# Patient Record
Sex: Female | Born: 1945 | Race: Black or African American | Hispanic: No | Smoking: Current every day smoker
Health system: Southern US, Community
[De-identification: ages and names within clinical notes are randomized; demographics above are authoritative.]

## PROBLEM LIST (undated history)

## (undated) DIAGNOSIS — K621 Rectal polyp: Secondary | ICD-10-CM

## (undated) DIAGNOSIS — J449 Chronic obstructive pulmonary disease, unspecified: Secondary | ICD-10-CM

## (undated) DIAGNOSIS — R61 Generalized hyperhidrosis: Secondary | ICD-10-CM

## (undated) DIAGNOSIS — I6529 Occlusion and stenosis of unspecified carotid artery: Secondary | ICD-10-CM

## (undated) DIAGNOSIS — E785 Hyperlipidemia, unspecified: Secondary | ICD-10-CM

## (undated) DIAGNOSIS — I10 Essential (primary) hypertension: Secondary | ICD-10-CM

## (undated) HISTORY — DX: Rectal polyp: K62.1

## (undated) HISTORY — DX: Generalized hyperhidrosis: R61

## (undated) HISTORY — DX: Essential (primary) hypertension: I10

## (undated) HISTORY — DX: Chronic obstructive pulmonary disease, unspecified: J44.9

## (undated) HISTORY — PX: BACK SURGERY: SHX140

## (undated) HISTORY — PX: TUBAL LIGATION: SHX77

## (undated) HISTORY — DX: Occlusion and stenosis of unspecified carotid artery: I65.29

## (undated) HISTORY — DX: Hyperlipidemia, unspecified: E78.5

---

## 1998-01-15 HISTORY — PX: ABDOMINAL HYSTERECTOMY: SHX81

## 2003-09-22 ENCOUNTER — Ambulatory Visit (HOSPITAL_COMMUNITY): Admission: RE | Admit: 2003-09-22 | Discharge: 2003-09-22 | Payer: Self-pay | Admitting: *Deleted

## 2006-06-18 ENCOUNTER — Ambulatory Visit (HOSPITAL_COMMUNITY): Admission: RE | Admit: 2006-06-18 | Discharge: 2006-06-19 | Payer: Self-pay | Admitting: Neurological Surgery

## 2006-09-17 ENCOUNTER — Ambulatory Visit: Payer: Self-pay | Admitting: *Deleted

## 2006-09-17 ENCOUNTER — Inpatient Hospital Stay (HOSPITAL_COMMUNITY): Admission: RE | Admit: 2006-09-17 | Discharge: 2006-09-20 | Payer: Self-pay | Admitting: Neurological Surgery

## 2006-09-19 ENCOUNTER — Encounter (INDEPENDENT_AMBULATORY_CARE_PROVIDER_SITE_OTHER): Payer: Self-pay | Admitting: Neurological Surgery

## 2007-04-29 ENCOUNTER — Encounter (INDEPENDENT_AMBULATORY_CARE_PROVIDER_SITE_OTHER): Payer: Self-pay | Admitting: Nurse Practitioner

## 2007-04-29 ENCOUNTER — Ambulatory Visit: Payer: Self-pay | Admitting: Family Medicine

## 2007-04-29 LAB — CONVERTED CEMR LAB
AST: 14 units/L (ref 0–37)
Alkaline Phosphatase: 140 units/L — ABNORMAL HIGH (ref 39–117)
BUN: 19 mg/dL (ref 6–23)
Basophils Relative: 0 % (ref 0–1)
Creatinine, Ser: 0.92 mg/dL (ref 0.40–1.20)
Eosinophils Absolute: 0 10*3/uL (ref 0.0–0.7)
Hemoglobin: 16.1 g/dL — ABNORMAL HIGH (ref 12.0–15.0)
MCHC: 33.2 g/dL (ref 30.0–36.0)
MCV: 87.4 fL (ref 78.0–100.0)
Monocytes Absolute: 0.5 10*3/uL (ref 0.1–1.0)
Monocytes Relative: 7 % (ref 3–12)
RBC: 5.55 M/uL — ABNORMAL HIGH (ref 3.87–5.11)

## 2007-04-30 ENCOUNTER — Encounter (INDEPENDENT_AMBULATORY_CARE_PROVIDER_SITE_OTHER): Payer: Self-pay | Admitting: Nurse Practitioner

## 2007-04-30 ENCOUNTER — Ambulatory Visit: Payer: Self-pay | Admitting: *Deleted

## 2007-04-30 ENCOUNTER — Ambulatory Visit: Payer: Self-pay | Admitting: Internal Medicine

## 2007-04-30 LAB — CONVERTED CEMR LAB
Cholesterol: 168 mg/dL (ref 0–200)
HDL: 44 mg/dL (ref >39)
Total CHOL/HDL Ratio: 3.8

## 2007-05-05 ENCOUNTER — Ambulatory Visit (HOSPITAL_COMMUNITY): Admission: RE | Admit: 2007-05-05 | Discharge: 2007-05-05 | Payer: Self-pay | Admitting: Family Medicine

## 2007-05-08 ENCOUNTER — Ambulatory Visit (HOSPITAL_COMMUNITY): Admission: RE | Admit: 2007-05-08 | Discharge: 2007-05-08 | Payer: Self-pay | Admitting: Family Medicine

## 2007-10-07 ENCOUNTER — Ambulatory Visit: Payer: Self-pay | Admitting: Family Medicine

## 2007-11-10 ENCOUNTER — Encounter (INDEPENDENT_AMBULATORY_CARE_PROVIDER_SITE_OTHER): Payer: Self-pay | Admitting: Family Medicine

## 2007-11-10 ENCOUNTER — Ambulatory Visit: Payer: Self-pay | Admitting: Internal Medicine

## 2007-11-10 LAB — CONVERTED CEMR LAB
Alkaline Phosphatase: 113 units/L (ref 39–117)
Creatinine, Ser: 0.73 mg/dL (ref 0.40–1.20)
Eosinophils Absolute: 0.1 10*3/uL (ref 0.0–0.7)
Eosinophils Relative: 1 % (ref 0–5)
Glucose, Bld: 205 mg/dL — ABNORMAL HIGH (ref 70–99)
HCT: 46.9 % — ABNORMAL HIGH (ref 36.0–46.0)
HDL: 51 mg/dL (ref >39)
LDL Cholesterol: 92 mg/dL (ref 0–99)
Lymphs Abs: 3.5 10*3/uL (ref 0.7–4.0)
MCV: 89.3 fL (ref 78.0–100.0)
Monocytes Absolute: 0.5 10*3/uL (ref 0.1–1.0)
Platelets: 269 10*3/uL (ref 150–400)
RDW: 14.5 % (ref 11.5–15.5)
Sodium: 139 meq/L (ref 135–145)
Total Bilirubin: 0.4 mg/dL (ref 0.3–1.2)
Total CHOL/HDL Ratio: 3.4
Total Protein: 7 g/dL (ref 6.0–8.3)
Triglycerides: 156 mg/dL — ABNORMAL HIGH (ref <150)
VLDL: 31 mg/dL (ref 0–40)
WBC: 8.1 10*3/uL (ref 4.0–10.5)

## 2008-01-05 ENCOUNTER — Ambulatory Visit: Payer: Self-pay | Admitting: Family Medicine

## 2008-05-21 ENCOUNTER — Ambulatory Visit: Payer: Self-pay | Admitting: Family Medicine

## 2008-05-26 ENCOUNTER — Ambulatory Visit: Payer: Self-pay | Admitting: Vascular Surgery

## 2008-05-26 ENCOUNTER — Encounter (INDEPENDENT_AMBULATORY_CARE_PROVIDER_SITE_OTHER): Payer: Self-pay | Admitting: Family Medicine

## 2008-05-26 ENCOUNTER — Ambulatory Visit (HOSPITAL_COMMUNITY): Admission: RE | Admit: 2008-05-26 | Discharge: 2008-05-26 | Payer: Self-pay | Admitting: Family Medicine

## 2008-05-27 ENCOUNTER — Ambulatory Visit (HOSPITAL_COMMUNITY): Admission: RE | Admit: 2008-05-27 | Discharge: 2008-05-27 | Payer: Self-pay | Admitting: Family Medicine

## 2008-07-08 ENCOUNTER — Ambulatory Visit: Payer: Self-pay | Admitting: Family Medicine

## 2008-09-30 ENCOUNTER — Ambulatory Visit: Payer: Self-pay | Admitting: Family Medicine

## 2008-10-04 ENCOUNTER — Ambulatory Visit (HOSPITAL_COMMUNITY): Admission: RE | Admit: 2008-10-04 | Discharge: 2008-10-04 | Payer: Self-pay | Admitting: Family Medicine

## 2008-12-31 ENCOUNTER — Ambulatory Visit: Payer: Self-pay | Admitting: Family Medicine

## 2008-12-31 LAB — CONVERTED CEMR LAB: Microalb, Ur: 1.46 mg/dL (ref 0.00–1.89)

## 2009-03-17 ENCOUNTER — Ambulatory Visit: Payer: Self-pay | Admitting: Family Medicine

## 2009-07-21 ENCOUNTER — Ambulatory Visit: Payer: Self-pay | Admitting: Family Medicine

## 2009-10-21 ENCOUNTER — Encounter (INDEPENDENT_AMBULATORY_CARE_PROVIDER_SITE_OTHER): Payer: Self-pay | Admitting: Family Medicine

## 2009-10-21 LAB — CONVERTED CEMR LAB
BUN: 9 mg/dL (ref 6–23)
CO2: 25 meq/L (ref 19–32)
Calcium: 9.8 mg/dL (ref 8.4–10.5)
Cholesterol: 166 mg/dL (ref 0–200)
Creatinine, Ser: 0.78 mg/dL (ref 0.40–1.20)
Glucose, Bld: 163 mg/dL — ABNORMAL HIGH (ref 70–99)
Total Bilirubin: 0.3 mg/dL (ref 0.3–1.2)
Total CHOL/HDL Ratio: 2.9
Triglycerides: 84 mg/dL (ref <150)
VLDL: 17 mg/dL (ref 0–40)

## 2010-03-14 ENCOUNTER — Encounter (INDEPENDENT_AMBULATORY_CARE_PROVIDER_SITE_OTHER): Payer: Self-pay | Admitting: Family Medicine

## 2010-03-14 ENCOUNTER — Other Ambulatory Visit (HOSPITAL_COMMUNITY): Payer: Self-pay | Admitting: Family Medicine

## 2010-03-14 DIAGNOSIS — Z1231 Encounter for screening mammogram for malignant neoplasm of breast: Secondary | ICD-10-CM

## 2010-03-23 ENCOUNTER — Ambulatory Visit (HOSPITAL_COMMUNITY)
Admission: RE | Admit: 2010-03-23 | Discharge: 2010-03-23 | Disposition: A | Discharge status: Home / Self Care | Payer: Medicare Other | Source: Ambulatory Visit | Attending: Family Medicine | Admitting: Family Medicine

## 2010-03-23 DIAGNOSIS — Z1231 Encounter for screening mammogram for malignant neoplasm of breast: Secondary | ICD-10-CM

## 2010-05-10 ENCOUNTER — Other Ambulatory Visit: Payer: Self-pay | Admitting: Surgery

## 2010-05-10 ENCOUNTER — Ambulatory Visit (HOSPITAL_COMMUNITY)
Admission: RE | Admit: 2010-05-10 | Discharge: 2010-05-10 | Disposition: A | Discharge status: Home / Self Care | Payer: Medicare Other | Source: Ambulatory Visit | Attending: Surgery | Admitting: Surgery

## 2010-05-10 ENCOUNTER — Other Ambulatory Visit (HOSPITAL_COMMUNITY): Payer: Self-pay | Admitting: Surgery

## 2010-05-10 ENCOUNTER — Encounter (HOSPITAL_COMMUNITY): Payer: Medicare Other

## 2010-05-10 DIAGNOSIS — Z01818 Encounter for other preprocedural examination: Secondary | ICD-10-CM | POA: Insufficient documentation

## 2010-05-10 DIAGNOSIS — Z01811 Encounter for preprocedural respiratory examination: Secondary | ICD-10-CM

## 2010-05-10 DIAGNOSIS — F17200 Nicotine dependence, unspecified, uncomplicated: Secondary | ICD-10-CM | POA: Insufficient documentation

## 2010-05-10 DIAGNOSIS — D126 Benign neoplasm of colon, unspecified: Secondary | ICD-10-CM | POA: Insufficient documentation

## 2010-05-10 DIAGNOSIS — Z01810 Encounter for preprocedural cardiovascular examination: Secondary | ICD-10-CM | POA: Insufficient documentation

## 2010-05-10 DIAGNOSIS — E119 Type 2 diabetes mellitus without complications: Secondary | ICD-10-CM | POA: Insufficient documentation

## 2010-05-10 DIAGNOSIS — Z01812 Encounter for preprocedural laboratory examination: Secondary | ICD-10-CM | POA: Insufficient documentation

## 2010-05-10 DIAGNOSIS — R0602 Shortness of breath: Secondary | ICD-10-CM | POA: Insufficient documentation

## 2010-05-10 LAB — BASIC METABOLIC PANEL
BUN: 10 mg/dL (ref 6–23)
CO2: 27 mEq/L (ref 19–32)
Calcium: 9.9 mg/dL (ref 8.4–10.5)
GFR calc non Af Amer: 58 mL/min — ABNORMAL LOW (ref >60)
Glucose, Bld: 263 mg/dL — ABNORMAL HIGH (ref 70–99)
Potassium: 4.6 mEq/L (ref 3.5–5.1)

## 2010-05-10 LAB — CBC
HCT: 48.8 % — ABNORMAL HIGH (ref 36.0–46.0)
Hemoglobin: 16 g/dL — ABNORMAL HIGH (ref 12.0–15.0)
MCH: 28.4 pg (ref 26.0–34.0)
MCHC: 32.8 g/dL (ref 30.0–36.0)
MCV: 86.7 fL (ref 78.0–100.0)
Platelets: 267 10*3/uL (ref 150–400)
RBC: 5.63 MIL/uL — ABNORMAL HIGH (ref 3.87–5.11)
RDW: 14.6 % (ref 11.5–15.5)
WBC: 9 10*3/uL (ref 4.0–10.5)

## 2010-05-16 ENCOUNTER — Other Ambulatory Visit: Payer: Self-pay | Admitting: Surgery

## 2010-05-16 ENCOUNTER — Ambulatory Visit (HOSPITAL_COMMUNITY)
Admission: RE | Admit: 2010-05-16 | Discharge: 2010-05-17 | Disposition: A | Discharge status: Home / Self Care | Payer: Medicare Other | Source: Ambulatory Visit | Attending: Surgery | Admitting: Surgery

## 2010-05-16 DIAGNOSIS — F17200 Nicotine dependence, unspecified, uncomplicated: Secondary | ICD-10-CM | POA: Insufficient documentation

## 2010-05-16 DIAGNOSIS — R05 Cough: Secondary | ICD-10-CM | POA: Insufficient documentation

## 2010-05-16 DIAGNOSIS — Z7982 Long term (current) use of aspirin: Secondary | ICD-10-CM | POA: Insufficient documentation

## 2010-05-16 DIAGNOSIS — E119 Type 2 diabetes mellitus without complications: Secondary | ICD-10-CM | POA: Insufficient documentation

## 2010-05-16 DIAGNOSIS — E7800 Pure hypercholesterolemia, unspecified: Secondary | ICD-10-CM | POA: Insufficient documentation

## 2010-05-16 DIAGNOSIS — Z79899 Other long term (current) drug therapy: Secondary | ICD-10-CM | POA: Insufficient documentation

## 2010-05-16 DIAGNOSIS — I10 Essential (primary) hypertension: Secondary | ICD-10-CM | POA: Insufficient documentation

## 2010-05-16 DIAGNOSIS — D128 Benign neoplasm of rectum: Secondary | ICD-10-CM | POA: Insufficient documentation

## 2010-05-16 DIAGNOSIS — R059 Cough, unspecified: Secondary | ICD-10-CM | POA: Insufficient documentation

## 2010-05-16 HISTORY — PX: RECTAL POLYPECTOMY: SHX2309

## 2010-05-16 LAB — GLUCOSE, CAPILLARY
Glucose-Capillary: 196 mg/dL — ABNORMAL HIGH (ref 70–99)
Glucose-Capillary: 220 mg/dL — ABNORMAL HIGH (ref 70–99)
Glucose-Capillary: 219 mg/dL — ABNORMAL HIGH (ref 70–99)

## 2010-05-17 LAB — GLUCOSE, CAPILLARY: Glucose-Capillary: 197 mg/dL — ABNORMAL HIGH (ref 70–99)

## 2010-05-30 NOTE — Op Note (Signed)
[name], [name]                  ACCOUNT NO.:  [id number]   MEDICAL RECORD NO.:  [id number]          PATIENT TYPE:  OIB   LOCATION:  3172                         FACILITY:  [facility]   PHYSICIAN:  Stefani Dama, M.D.  DATE OF BIRTH:  [birth year]   DATE OF PROCEDURE:  06/18/2006  DATE OF DISCHARGE:                               OPERATIVE REPORT   PREOPERATIVE DIAGNOSIS:  Herniated nucleus pulposus, L4-L5 left, with  left lumbar radiculopathy.   POSTOPERATIVE DIAGNOSIS:  Herniated nucleus pulposus, L4-L5 left, with  left lumbar radiculopathy.   PROCEDURE:  Left L4-L5 microdiskectomy, with operating microscope and  microdissection technique.   SURGEON:  Stefani Dama, M.D.   FIRST ASSISTANT:  Clydene Fake, M.D.   ANESTHESIA:  General endotracheal.   INDICATIONS:  [name] is a 65 year old individual who as had  significant back and left lower extremity pain for several months now.  She has failed efforts at conservative management and was noted to have  a small disk herniation at L4-L5 on the left-hand side.  After repeated  attempts at conservative management.  She was ultimately advised  regarding surgical decompression of the disk space at L4-5 on the left  side.  She is now taken to the operating room.   PROCEDURE:  The patient was brought to the operating room supine on the  stretcher.  After smooth induction of general endotracheal anesthesia,  she was turned prone.  The back was shaved, prepped with alcohol and  DuraPrep and draped in a sterile fashion.  A midline incision was  created and carried down to lumbodorsal fascia.  The fascia was opened  on left side of the midline.  The first interspace was marked and noted  be L4-L5 on localizing radiograph.  Laminotomy was then created,  removing the inferior margin of lamina of L4 out to the medial wall of  the facet.  The laminotomy was enlarged by taking up the yellow ligament  and identifying the common dural  tube.  The region of the L5 nerve root  takeoff was identified with a slight medial facetectomy being performed  and immediately, the L5 nerve root came into view.  It was noted to be  bowed dorsally, coming out at a rather oblique angle to the sac.  By  dissecting in the epidural space there was noted be a significant mass  just cephalad and medial to the nerve root that elevating it and  compressing it.  The mass was noted be a fragment of disk material and  this was removed in several small fragments from this area, covered by a  thin veil of scar tissue.  The disk herniation itself was immediately  adjacent to the disk space and further dissection yielded an opening  into the disk space.  This was enlarged with a number 15 blade and then,  a combination of Kerrison rongeurs removed a substantial amount of  severely degenerated disk material from the L4-L5 space.  The procedure  continued using curettes, rongeurs and nerve hooks to reach all the  possible degenerated disk  material that was able to be with this  aperture.  In the end, the common dural tube and the nerve root was well  decompressed.  The wound was irrigated copiously, including the disk  space to remove and float out any other loose fragments.  Once this was  completed and the area with secured hemostasis, the epidural space was  again checked with bipolar cautery and then, 40 mg Depo-Medrol, along  with 50 mcg fentanyl was left in the epidural space.  The microscope was  removed from the field.  The wound was then  closed in layers with number 1 Vicryl in the lumbodorsal fascia, 2-0  Vicryl subcutaneous tissues and 3-0 Vicryl subcuticular.  A dry sterile  dressing was placed on the skin.  It should be noted that during this  procedure 30 mL of local lidocaine and Marcaine mixture was injected  into the paraspinous musculature and the subcutaneous tissues.      Stefani Dama, M.D.  Electronically Signed      HJE/MEDQ  D:  06/18/2006  T:  06/19/2006  Job:  [id number]

## 2010-05-30 NOTE — Op Note (Signed)
[name], [name]                  ACCOUNT NO.:  [id number]   MEDICAL RECORD NO.:  [id number]          PATIENT TYPE:  INP   LOCATION:  2864                         FACILITY:  [facility]   PHYSICIAN:  Stefani Dama, M.D.  DATE OF BIRTH:  1945-11-06   DATE OF PROCEDURE:  09/17/2006  DATE OF DISCHARGE:                               OPERATIVE REPORT   PREOPERATIVE DIAGNOSIS:  Recurrent herniated nucleus pulposus, L4-5,  with lumbar radiculopathy, spondylosis L4-5.   POSTOPERATIVE DIAGNOSIS:  Recurrent herniated nucleus pulposus, L4-5,  with lumbar radiculopathy, spondylosis L4-5.   PROCEDURES:  Anterior lumbar interbody arthrodesis, L4-5, with  decompression of L4 and L5 nerve roots, PEEK spacer, anterior fixation  device.   SURGEON:  Stefani Dama, M.D.   FIRST ASSISTANT:  Cristi Loron, M.D.   ANESTHESIA:  General endotracheal.   INDICATIONS:  [name] is a 65 year old individual who has had a  significant problem with a herniated nucleus pulposus.  Four or 5 months  ago she underwent posterior laminotomy and diskectomy.  She recovered  very well and did well for the first 3 months' time.  Soon after she  return to work she developed a sudden and severe recurrence of pain and  noted that she had severe problems with back pain associated with the  leg pain.  She was found to have a recurrence of the disk herniation and  also had developed a retrolisthesis of L4 on L5 with marked edema in the  endplates of the vertebrae.  She was advised regarding need for not only  decompression but a stabilization procedure at the L4-5 level.   PROCEDURE:  The patient was brought to the operating room, placed on  table in supine position.  After the smooth induction of general  endotracheal anesthesia, she underwent an anterior retroperitoneal  approach by Dr. Liliane Bade.  Once the exposure was obtained with  retractors in place to expose the anterior portion of the L4 and L5  vertebrae,  I started the procedure by opening the anterior longitudinal  ligament and encountering a severely degenerated disk at the L4-5 level.  This came out in a piecemeal fashion with a significant number of  fragments of disk in the center portion of the disk space.  These were  removed and the endplates were curetted to remove near all the cartilage  that was available.  As the region of the posterior longitudinal  ligament was explored, a self-retaining disk spreader could be placed  into this area to allow some distraction and an opening was found into  the epidural space, particularly on the left side where the previous  diskectomy was performed.  In this area there was noted to be some free  disk material that was up against the dura.  This was removed and  resected and allowed for good decompression of the common dural tube,  and on the lateral aspect the exiting L4 nerve root could be identified.  This area was also decompressed as there was noted to be a substantial  portion of the disk that appeared  herniated into the lateral recess.  With this, then the dissection was carried across to the right side and  the ligament was allowed to remain intact but the disk was removed in  its entirety.  Once this was accomplished, the endplates were smoothed  with a high-speed barrel bur to remove any remnants of cartilaginous  endplate and to assure that there was good bony continuity with whatever  graft had been placed.  Then a series of sizers were used to choose the  appropriate-size 30 x 36 mm PEEK interbody spacer along with an anterior  plate was chosen.  This was sized with radiographic confirmation  obtained in the AP and lateral projections and ultimately when the PEEK  spacer was obtained, it was filled with a combination of beta tricalcium  phosphate along with some small pledgets of Infuse.  The Infuse and the  graft were then placed in position using the squid applicator and then   four 20-mm screws were placed into the vertebrae, two into L4 and two  into L5.  Final radiographic confirmation of the implant was obtained.  Hemostasis was checked in the soft tissues and then the retractors were  removed and the procedure was turned back over to Dr. Madilyn Fireman for closure.      Stefani Dama, M.D.  Electronically Signed     HJE/MEDQ  D:  09/17/2006  T:  09/17/2006  Job:  [id number]

## 2010-05-30 NOTE — Op Note (Signed)
[name], [name]                  ACCOUNT NO.:  [id number]   MEDICAL RECORD NO.:  [id number]          PATIENT TYPE:  INP   LOCATION:  2864                         FACILITY:  [facility]   PHYSICIAN:  Balinda Quails, M.D.    DATE OF BIRTH:  January 28, 1945   DATE OF PROCEDURE:  09/17/2006  DATE OF DISCHARGE:                               OPERATIVE REPORT   SURGEON:  Denman George, MD   CO-SURGEON:  Stefani Dama, MD.   ANESTHETIC:  General endotracheal.   ANESTHESIOLOGIST:  Bedelia Person, M.D.   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   POSTOPERATIVE DIAGNOSIS:   PROCEDURE:  L4-5 anterior lumbar interbody fusion.   CLINICAL NOTE:  [name] is a 65 year old female with a history of  previous diskectomy at L4-5.  She has recurrent pain and the plan at  this time is to perform L4-5 anterior lumbar interbody fusion.   The patient was seen preoperatively and evaluated.  Felt to be good  candidate.  No history of MI.  Denied vascular disease.  No DVT.  Normal  lower extremity pulses.  It was discussed with the patient the potential  operative risks with a major morbidity and mortality of 1-2%.  The  patient consented for surgery.   PROCEDURE NOTE:  The patient was brought to the operating room in a  stable hemodynamic condition.  Placed under general endotracheal  anesthesia in the supine position.  The abdomen prepped and draped in a  sterile fashion.  A transverse skin incision made in the left lower  quadrant at the [facility] site for L4-5.  Subcutaneous tissue divided  with electrocautery.  Anterior rectus sheath divided from midline to  lateral margin.  Superior and inferior rectus flaps created.  The rectus  muscle bluntly mobilized.  Retracted medially.   The retroperitoneal space was entered and the peritoneum freed from the  posterior rectus sheath, which was divided longitudinally.  The  retroperitoneal space was then further created and the ureter rotated  anteriorly with the abdominal contents.  The left common iliac and  external iliac artery were skeletonized and retracted medially.  The  left common iliac vein and external iliac vein also mobilized, dissected  bluntly.  The iliolumbar vein divided after ligation with 2-0 silk.  The  L4-5 disk was palpated.  The L4 segmental vessels were clipped and  divided.  The left common iliac artery and vein were mobilized to the  right.  This allowed complete exposure of the L4-5 disk.   Using the Brau-Thompson retractor with reverse lips, the L4-5 this was  exposed.  Malleable retractors used to retract superiorly and  inferiorly.   The disk space was verified with fluoroscopy.  Dr. Danielle Dess then completed  L4-5 ALIF.   At completion of the ALIF, retractors were removed.  The retroperitoneal  space examined.  There was no significant bleeding.  Sponge and  instrument counts correct.   Pulse oximetry on the left foot remained 99% throughout the procedure.   The anterior rectus sheath then closed running 0 Vicryl suture.  Subcutaneous tissue closed with running 2-0 Vicryl suture.  Skin closed  4-0 Monocryl.  Dermabond applied.  No apparent complications.  A 2+ left  dorsalis pedis pulse at termination of the procedure.  The patient  transferred to the recovery room in stable condition.      Balinda Quails, M.D.  Electronically Signed     PGH/MEDQ  D:  09/17/2006  T:  09/17/2006  Job:  [id number]

## 2010-05-30 NOTE — Op Note (Signed)
[name], [name]                  ACCOUNT NO.:  [id number]  MEDICAL RECORD NO.:  [id number]           PATIENT TYPE:  I  LOCATION:  1536                         FACILITY:  [facility]  PHYSICIAN:  Ardeth Sportsman, MD     DATE OF BIRTH:  07-08-1945  DATE OF PROCEDURE:  05/16/2010 DATE OF DISCHARGE:                              OPERATIVE REPORT   PRIMARY CARE PHYSICIAN:  Maurice March, M.D.  GASTROENTEROLOGIST:  Jordan Hawks. Elnoria Howard, M.D.  OPERATING SURGEON:  Ardeth Sportsman, M.D.  ASSISTANT:  RN  PREOPERATIVE DIAGNOSIS:  Tubovillous adenoma of the distal rectum.  POSTOPERATIVE DIAGNOSIS:  Tubovillous adenoma of the distal rectum.  PROCEDURE PERFORMED:  Transanal endoscopic microsurgical partial proctectomy of rectal polyp (TEM).  ANESTHESIA: 1. General. 2. Anorectal field block.  SPECIMENS:  Rectal polyp. 1. The pink pins are proximal. 2. The yellow pins are distal. 3. The lavender are left lateral. 4. The blue teal are right lateral. 5. New right lateral margin pins are similar.  ESTIMATED BLOOD LOSS:  Minimal.  COMPLICATIONS:  None apparent.  INDICATIONS:  [name] is a 65 year old female with a family history of colon cancer.  She was found on a colonoscopy to have a polyp in her distal rectum.  She was sent to me for surgical excision, as the biopsy came back consistent with tubovillous adenoma.  Anatomy and physiology of the digestive tract was explained. Pathophysiology of colon polyps and progression to cancer were discussed.  Options were discussed, recommendations made for resection of the polyp.  Because there is no definite diagnosis of cancer, I think incisional biopsy with good margins is appropriate through transanal and TEM techniques.  Although it was rather distal, I was worried about the proximal margin being able to see and get a good proximal view transanally, so I recommended a TEM approach.  She agreed to proceed.  OPERATIVE FINDINGS:  She had  a distal rectal polyp in the posterior midline going from about 4 o'clock to 7 o'clock, slightly left to the midline posteriorly rather distal.  It seemed initially pedunculated, but it was actually rather sessile with just a redundant, floppy rectum. It was about 2.5 cm base.  It seemed soft and not strongly suspicious for cancer.  DESCRIPTION OF PROCEDURE:  Informed consent was confirmed.  The patient underwent general anesthesia without any difficulty.  She was placed in lateral lithotomy.  She received IV antibiotics.  She had a Foley catheter placed.  Her perineum and abdomen were prepped and draped in the sterile fashion.  Surgical time-out confirmed the plan.  I did digital examination to confirm proper orientation.  After doing some gentle finger dilation and larger anoscopes, I was able to place a 4 cm TEO Storz anoscope into the rectum.  Mounted up the system and I induced a carbon dioxide of intraluminal insufflation.  Camera inspection were noted proper location.  I used a needle tip cautery and marked 1.5 cm margin circumferentially starting distally and moving around circumferentially.  I started going through the proximal anoderm towards the distal margin and transitioned over to ultrasonic dissection  to get a good circumferential margin, starting first on a left lateral, then distal, then right lateral, then posteriorly, and finally the proximal margin.  It came out intact and I marked with pins.  In pinning it, the right lateral margin seemed a little closer than I anticipated, so I took another 1 cm band of the right lateral margin to make sure I had good margins and we pinned it.  I scrubbed down, went to Pathology, and showed it to them for proper orientation, which they confirmed.  I went back in.  Hemostasis was good.  Doing inspection, sphincter was intact, everything else was normal.  I went ahead and reapproximated using 2-0 Vicryl interrupted stitches  primarily transanally through the [employer] system.  I used interrupted stitches and had good closure.  I released and felt the sphincter was intact.  Hemostasis was good.  I placed a Gelfoam bullet up in to the rectum.  The patient was extubated and taken to the recovery room in stable condition.  We will observe her overnight for pain control and monitoring.     Ardeth Sportsman, MD     SCG/MEDQ  D:  05/16/2010  T:  05/17/2010  Job:  [id number]  cc:   Maurice March, M.D. Fax: [phone]  Jordan Hawks. Elnoria Howard, MD Fax: [phone]  Electronically Signed by Karie Soda MD on 05/30/2010 12:06:16 PM

## 2010-05-30 NOTE — Discharge Summary (Signed)
NAMETANEAH, MASRI                  ACCOUNT NO.:  [id number]   MEDICAL RECORD NO.:  [id number]          PATIENT TYPE:  INP   LOCATION:  3028                         FACILITY:  [facility]   PHYSICIAN:  Stefani Dama, M.D.  DATE OF BIRTH:  [birth year]   DATE OF ADMISSION:  09/17/2006  DATE OF DISCHARGE:  09/20/2006                               DISCHARGE SUMMARY   ADMITTING DIAGNOSIS:  Lumbar spondylosis with recurrent herniated  nucleus pulposus, L4-L5 left lumbar radiculopathy.   FINAL DIAGNOSIS:  1. Lumbar spondylosis, recurrent herniated nucleus pulposus L4-L5 with      left lumbar radiculopathy.  2. Diagnosis is diabetes mellitus.   CONDITION ON DISCHARGE:  Improving.   HOSPITAL COURSE:  Rosilyn Coachman is a 65 year old individual who earlier  this year underwent diskectomy for herniated nucleus pulposus at L4-L5  on the left side.  She tolerated the surgery well, but when she got back  to the work place, she suddenly re-herniated the disk and developed  severe excruciating back and leg pain.  After careful consideration, it  was noted that she had severe spondylitic changes in L4-L5 space, and  she was advised regarding decompression and arthrodesis.  This was  undertaken via an anterior lumbar interbody fusion on September 17, 2006.  Postoperatively, she was ambulating.  She had a bout of fever on the  second postoperative day.  She was treated with incentive spirometer and  vigorous ambulation, and it seemed to resolve itself.  The incision is  clean and dry, and at current time she is discharged home with Lovenox  40 mg subcu daily for 10 days, followed by Percocet and Valium for pain  control.  She will be seen in the office in three weeks' time.   CONDITION ON DISCHARGE:  Improved.      Stefani Dama, M.D.  Electronically Signed     HJE/MEDQ  D:  09/20/2006  T:  09/20/2006  Job:  [id number]

## 2010-08-01 ENCOUNTER — Emergency Department (HOSPITAL_COMMUNITY): Payer: Medicare Other

## 2010-08-01 ENCOUNTER — Observation Stay (HOSPITAL_COMMUNITY)
Admission: EM | Admit: 2010-08-01 | Discharge: 2010-08-02 | Disposition: A | Discharge status: Home / Self Care | Payer: Medicare Other | Attending: Family Medicine | Admitting: Family Medicine

## 2010-08-01 DIAGNOSIS — R0602 Shortness of breath: Secondary | ICD-10-CM | POA: Insufficient documentation

## 2010-08-01 DIAGNOSIS — K859 Acute pancreatitis without necrosis or infection, unspecified: Secondary | ICD-10-CM | POA: Insufficient documentation

## 2010-08-01 DIAGNOSIS — Z8249 Family history of ischemic heart disease and other diseases of the circulatory system: Secondary | ICD-10-CM | POA: Insufficient documentation

## 2010-08-01 DIAGNOSIS — Z7982 Long term (current) use of aspirin: Secondary | ICD-10-CM | POA: Insufficient documentation

## 2010-08-01 DIAGNOSIS — M47817 Spondylosis without myelopathy or radiculopathy, lumbosacral region: Secondary | ICD-10-CM | POA: Insufficient documentation

## 2010-08-01 DIAGNOSIS — E119 Type 2 diabetes mellitus without complications: Secondary | ICD-10-CM | POA: Insufficient documentation

## 2010-08-01 DIAGNOSIS — Z79899 Other long term (current) drug therapy: Secondary | ICD-10-CM | POA: Insufficient documentation

## 2010-08-01 DIAGNOSIS — F17200 Nicotine dependence, unspecified, uncomplicated: Secondary | ICD-10-CM | POA: Insufficient documentation

## 2010-08-01 DIAGNOSIS — J209 Acute bronchitis, unspecified: Secondary | ICD-10-CM | POA: Insufficient documentation

## 2010-08-01 DIAGNOSIS — E7800 Pure hypercholesterolemia, unspecified: Secondary | ICD-10-CM | POA: Insufficient documentation

## 2010-08-01 DIAGNOSIS — I10 Essential (primary) hypertension: Secondary | ICD-10-CM | POA: Insufficient documentation

## 2010-08-01 DIAGNOSIS — R0789 Other chest pain: Principal | ICD-10-CM | POA: Insufficient documentation

## 2010-08-01 LAB — COMPREHENSIVE METABOLIC PANEL
ALT: 15 U/L (ref 0–35)
AST: 17 U/L (ref 0–37)
Alkaline Phosphatase: 98 U/L (ref 39–117)
Calcium: 9.5 mg/dL (ref 8.4–10.5)
GFR calc Af Amer: >60 mL/min (ref >60)
Glucose, Bld: 211 mg/dL — ABNORMAL HIGH (ref 70–99)
Potassium: 4.5 mEq/L (ref 3.5–5.1)
Sodium: 138 mEq/L (ref 135–145)
Total Protein: 7.3 g/dL (ref 6.0–8.3)

## 2010-08-01 LAB — CARDIAC PANEL(CRET KIN+CKTOT+MB+TROPI)
CK, MB: 1.4 ng/mL (ref 0.3–4.0)
Relative Index: INVALID (ref 0.0–2.5)
Total CK: 39 U/L (ref 7–177)
Troponin I: <0.3 ng/mL (ref <0.30)

## 2010-08-01 LAB — DIFFERENTIAL
Basophils Absolute: 0 10*3/uL (ref 0.0–0.1)
Lymphocytes Relative: 36 % (ref 12–46)
Monocytes Absolute: 0.4 10*3/uL (ref 0.1–1.0)
Monocytes Relative: 6 % (ref 3–12)
Neutro Abs: 3.8 10*3/uL (ref 1.7–7.7)
Neutrophils Relative %: 58 % (ref 43–77)

## 2010-08-01 LAB — GLUCOSE, CAPILLARY

## 2010-08-01 LAB — CBC
HCT: 44.7 % (ref 36.0–46.0)
Hemoglobin: 15.8 g/dL — ABNORMAL HIGH (ref 12.0–15.0)
MCH: 30.3 pg (ref 26.0–34.0)
MCHC: 35.3 g/dL (ref 30.0–36.0)
RBC: 5.21 MIL/uL — ABNORMAL HIGH (ref 3.87–5.11)

## 2010-08-01 LAB — CK TOTAL AND CKMB (NOT AT ARMC)
CK, MB: 1.3 ng/mL (ref 0.3–4.0)
Total CK: 49 U/L (ref 7–177)

## 2010-08-01 LAB — TROPONIN I: Troponin I: <0.3 ng/mL (ref <0.30)

## 2010-08-02 ENCOUNTER — Observation Stay (HOSPITAL_COMMUNITY): Payer: Medicare Other

## 2010-08-02 LAB — DIFFERENTIAL
Basophils Absolute: 0 10*3/uL (ref 0.0–0.1)
Basophils Relative: 0 % (ref 0–1)
Eosinophils Absolute: 0 10*3/uL (ref 0.0–0.7)
Eosinophils Relative: 0 % (ref 0–5)
Neutrophils Relative %: 84 % — ABNORMAL HIGH (ref 43–77)

## 2010-08-02 LAB — CBC
MCV: 86.3 fL (ref 78.0–100.0)
Platelets: 261 10*3/uL (ref 150–400)
RBC: 5.11 MIL/uL (ref 3.87–5.11)
RDW: 14.4 % (ref 11.5–15.5)
WBC: 9.2 10*3/uL (ref 4.0–10.5)

## 2010-08-02 LAB — LIPID PANEL
Cholesterol: 146 mg/dL (ref 0–200)
HDL: 56 mg/dL (ref >39)
Triglycerides: 48 mg/dL (ref <150)
VLDL: 10 mg/dL (ref 0–40)

## 2010-08-02 LAB — COMPREHENSIVE METABOLIC PANEL
Albumin: 3.6 g/dL (ref 3.5–5.2)
BUN: 13 mg/dL (ref 6–23)
Calcium: 9.4 mg/dL (ref 8.4–10.5)
Chloride: 104 mEq/L (ref 96–112)
Creatinine, Ser: 0.65 mg/dL (ref 0.50–1.10)
GFR calc non Af Amer: >60 mL/min (ref >60)
Total Bilirubin: 0.1 mg/dL — ABNORMAL LOW (ref 0.3–1.2)

## 2010-08-02 LAB — HEMOGLOBIN A1C: Mean Plasma Glucose: 203 mg/dL — ABNORMAL HIGH (ref <117)

## 2010-08-02 LAB — CARDIAC PANEL(CRET KIN+CKTOT+MB+TROPI)
CK, MB: 1.6 ng/mL (ref 0.3–4.0)
Relative Index: INVALID (ref 0.0–2.5)
Total CK: 60 U/L (ref 7–177)
Troponin I: <0.3 ng/mL (ref <0.30)

## 2010-08-02 LAB — GLUCOSE, CAPILLARY: Glucose-Capillary: 234 mg/dL — ABNORMAL HIGH (ref 70–99)

## 2010-08-02 MED ORDER — TECHNETIUM TC 99M TETROFOSMIN IV KIT
10.0000 | PACK | Freq: Once | INTRAVENOUS | Status: AC | PRN
  Administered 2010-08-02: 10 via INTRAVENOUS

## 2010-08-02 MED ORDER — TECHNETIUM TC 99M TETROFOSMIN IV KIT
30.0000 | PACK | Freq: Once | INTRAVENOUS | Status: AC | PRN
  Administered 2010-08-02: 30 via INTRAVENOUS

## 2010-08-02 NOTE — Discharge Summary (Signed)
NAMEKRYSTYNA, [name] NO.:  [id number]  MEDICAL RECORD NO.:  [id number]  LOCATION:  3733                         FACILITY:  [facility]  PHYSICIAN:  Standley Dakins, MD   DATE OF BIRTH:  [birth year]  DATE OF ADMISSION:  08/01/2010 DATE OF DISCHARGE:  08/02/2010                              DISCHARGE SUMMARY   PRIMARY CARE PHYSICIAN:  Colleen March, MD with the [facility].  CARDIOLOGISTEduardo Osier Sharyn Lull, MD with [facility].  DISCHARGE DIAGNOSES: 1. Atypical chest pain. 2. Diabetes mellitus type 2, insulin requiring, poorly controlled. 3. Lumbar spondylosis. 4. Hyperlipidemia - controlled. 5. Villous adenoma status post removal in 2011. 6. Status post back surgery. 7. Chronic active nicotine dependence. 8. Acute bronchitis. 9. Mild acute pancreatitis with elevated lipase level.  DISCHARGE MEDICATIONS: 1. Azithromycin 250 mg 1 tablet p.o. daily for an additional 4 days. 2. Nitroglycerin 0.4 mg tablets sublingual under the tongue every 5     minutes up to three doses as needed for chest pain. 3. Aspirin 81 mg 1 tablet by mouth every morning. 4. Glimepiride 2 mg 1 tablet by mouth twice daily. 5. Lisinopril 5 mg 1 tablet by mouth every morning. 6. Metformin 2 tablets by mouth twice daily. 7. Pravastatin 40 mg 1 tablet p.o. daily at bedtime. 8. Vitamin D3 1000 units 2 tablets by mouth every morning.  HOSPITAL COURSE:  Briefly, this patient is a 65 year old female smoker with poorly-controlled type 2 diabetes mellitus, who presented to the emergency department with atypical substernal chest pain that was 8/10 in severity associated with shortness of breath.  She was found to have an acute exacerbation of bronchitis and mild pancreatitis with a mildly elevated lipase.  The patient was admitted into the hospital and seen by the cardiology specialist, Dr. Sharyn Douglas who recommended a stress test. She  had a stress test done on August 02, 2010, and the preliminary results of that test revealed no significant reversible cardiac ischemia.  The final results of this stress test are still pending at time of discharge.  The patient is going to be following up with her PCP and cardiologist outpatient to review the final results.  She has had resolution of all of these symptoms at this time.  She was treated with antibiotics for the bronchitis and her symptoms resolved completely.  In addition, hemoglobin A1c revealed that her glycemic control has been poor with an A1c of 8.7.  Her LDL cholesterol is 80, HDL cholesterol is 56, total cholesterol 146, and triglycerides 48.  Given that the patient's symptoms have completely resolved and she has tolerated the stress test well and symptoms resolved, preliminary reports are that no arrhythmias seen and negative Persantine test and Myoview test pending. The patient will be discharged to follow up the results outpatient with her PCP.  DISCHARGE CONDITION:  Stable.  DISPOSITION:  The patient will be discharged home with family members.  ACTIVITY:  Ad lib.  DIET:  Cardiac diet recommended.  Diabetic diet recommended.  No concentrated sweets or fruit juices except to treat a low blood glucose.  FOLLOWUP:  The patient should follow  up with her primary care physician, Dr. Audria Douglas in 1 week and cardiologist in 2 weeks.  SPECIAL INSTRUCTIONS: 1. Please tests blood glucose 1-2 times per day and p.r.n. 2. Avoid concentrated sweets. 3. Please stop using nicotine products. 4. Call 1800 - quit now for assistance with smoking cessation. 5. Return if symptoms recur, worsen or new changes develop like     recurrent chest pain, shortness of breath, visual changes, or     changes in mental status.  ADDENDUM:  I did get the results of the Myoview study and the results were negative.  Normal myocardial perfusion reported.  Calculated ejection fraction is 83%.   No evidence of pharmacologically-induced ischemia.  The patient was evaluated on the day of discharge, August 02, 2010.  The patient was awake, alert in no distress, cooperative and had no chest pain or shortness of breath.  PHYSICAL EXAMINATION:  VITAL SIGNS:  Reviewed, temperature 99.2, pulse 92, respirations 18, blood pressure 138/80, pulse ox 99% on room air. GENERAL:  Well-developed, well-nourished female, awake, alert in no distress. HEENT:  Normocephalic, atraumatic.  Mucous membranes moist and normal dentition. NECK:  Supple.  Thyroid soft.  No nodules or masses palpated.  No carotid bruits heard.  No lymphadenopathy. LUNGS:  Bilateral breath sounds, clear to auscultation.  No crackles, wheezes, or rhonchi. CARDIAC:  Normal S1 and S2 sounds without murmurs, rubs, or gallops. ABDOMEN:  Soft, nondistended, nontender.  No masses palpated.  No hepatosplenomegaly, guarding, or rebound tenderness. EXTREMITIES:  No pretibial edema, cyanosis, or clubbing.  LABORATORY DATA:  Hemoglobin A1c 8.7%.  Sodium 137, potassium 4.5, chloride 104, CO2 of 24, BUN 13, creatinine 0.65, AST 6, ALT 10, total protein 7.2, albumin 3.6, calcium 9.4.  Troponin less than 0.30 x3. White blood cell count 9.2, hemoglobin 15.3, hematocrit 44.1, platelet count 261.  IMPRESSION:  Atypical chest pain, rule out for myocardial injury and ischemia.  The patient is evaluated and determined to be stable for discharge home with close followup with her primary care physician and cardiologist.  I spent 37 minutes preparing this patient's discharge including reviewing her medical records and consultation notes, reconciling medications, arranging followup, and counseling the patient regarding tobacco cessation.     Standley Dakins, MD     CJ/MEDQ  D:  08/02/2010  T:  08/02/2010  Job:  [id number]  cc:   [name], M.D. Colleen Osier. Sharyn Douglas, M.D.  Electronically Signed by Standley Dakins  on 08/02/2010  06:14:21 PM

## 2010-08-03 ENCOUNTER — Other Ambulatory Visit (HOSPITAL_COMMUNITY): Payer: Medicare Other

## 2010-08-03 ENCOUNTER — Ambulatory Visit: Payer: Medicare Other | Admitting: *Deleted

## 2010-08-21 ENCOUNTER — Ambulatory Visit: Payer: Medicare Other | Admitting: *Deleted

## 2010-08-31 ENCOUNTER — Encounter (INDEPENDENT_AMBULATORY_CARE_PROVIDER_SITE_OTHER): Payer: Self-pay | Admitting: Surgery

## 2010-08-31 ENCOUNTER — Ambulatory Visit (INDEPENDENT_AMBULATORY_CARE_PROVIDER_SITE_OTHER): Payer: Medicare Other | Admitting: Surgery

## 2010-08-31 VITALS — BP 132/76 | HR 92 | Temp 96.8°F | Ht 67.0 in | Wt 160.4 lb

## 2010-08-31 DIAGNOSIS — Z8719 Personal history of other diseases of the digestive system: Secondary | ICD-10-CM | POA: Insufficient documentation

## 2010-08-31 DIAGNOSIS — Z9889 Other specified postprocedural states: Secondary | ICD-10-CM

## 2010-08-31 NOTE — Progress Notes (Signed)
Subjective:     Patient ID: [name], female   DOB: [birth year], 65 y.o.   MRN: [id number]  HPI  The patient is a feeling well. She notes that the fecal urgency he is going down. One to 2 bowel movements a day. Occasional some foul-smelling stool & flatus but that is slowly improving. Energy level is good. No bleeding.  Review of Systems  Gastrointestinal: Negative for nausea, vomiting, abdominal pain, diarrhea, constipation, blood in stool, abdominal distention, anal bleeding and rectal pain.       Decreased fecal urgency.  Some foul flatus but not severe  All other systems reviewed and are negative.       Objective:   Physical Exam  Constitutional: She is oriented to person, place, and time. She appears well-developed and well-nourished. No distress.  HENT:  Head: Normocephalic.  Mouth/Throat: Oropharynx is clear and moist. No oropharyngeal exudate.  Eyes: Conjunctivae and EOM are normal. Pupils are equal, round, and reactive to light. No scleral icterus.  Neck: Normal range of motion. Neck supple. No tracheal deviation present.  Cardiovascular: Normal rate, regular rhythm and intact distal pulses.   Pulmonary/Chest: Effort normal and breath sounds normal. No respiratory distress. She exhibits no tenderness.  Abdominal: Soft. She exhibits no distension and no mass. There is no tenderness. Hernia confirmed negative in the right inguinal area and confirmed negative in the left inguinal area.  Genitourinary: Vagina normal. No vaginal discharge found.       Perianal skin clean, No fissure/fistula.  NST  Scar at anorectal junction 2cm from anal verge, smooth.  No stricture/recurrence  Musculoskeletal: Normal range of motion. She exhibits no tenderness.  Lymphadenopathy:    She has no cervical adenopathy.       Right: No inguinal adenopathy present.       Left: No inguinal adenopathy present.  Neurological: She is alert and oriented to person, place, and time. No cranial nerve  deficit. She exhibits normal muscle tone. Coordination normal.  Skin: Skin is warm and dry. No rash noted. She is not diaphoretic. No erythema.  Psychiatric: She has a normal mood and affect. Her behavior is normal. Judgment and thought content normal.       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    Plan:     I noted he can take 1-2 years to from the rectum to fully adapt. The fact that she is improving only a few months out is encouraging. Continue bowel regimen.  Return to clinic in 3 months for another rectal check.  Colonoscopy in one year from resection in May 2013 with her gastroenterologist (Dr. Jeani Hawking).  She was very appreciative of our care.

## 2010-10-03 NOTE — H&P (Signed)
[name], [name]                  ACCOUNT NO.:  [id number]  MEDICAL RECORD NO.:  [id number]  LOCATION:  3733                         FACILITY:  [facility]  PHYSICIAN:  Mckaila Duffus, DO         DATE OF BIRTH:  [birth year]  DATE OF ADMISSION:  08/01/2010 DATE OF DISCHARGE:                             HISTORY & PHYSICAL   CHIEF COMPLAINT:  Chest pain.  HISTORY OF PRESENT ILLNESS:  The patient is a 65 year old female who presents to the emergency room after an episode of chest pain.  Chest pain was substernal.  She said it was an 8/10 in severity.  It was associated with shortness of breath.  She did throw up with it and she got sweaty.  Altogether states that it lasts about an hour and a half. It was finally relieved with nitroglycerin and morphine.  The patient states also that she has had increased cough and some sputum production. Last Sunday it sounds as though she had a viral syndrome with nausea, vomiting, diarrhea, and since then she has had this increased cough. She denies shortness breath.  PAST MEDICAL HISTORY:  Significant for diabetes mellitus, lumbar spondylosis, hyperlipidemia.  She had a villous adenoma removed from the colon last year.  PAST SURGICAL HISTORY:  Back surgery.  MEDICATIONS AT HOME: 1. Metformin 500 mg 2 p.o. b.i.d. 2. Pravastatin 40 mg 1 p.o. daily. 3. Lisinopril 5 mg 1 p.o. daily. 4. Glimepiride 2 mg 1 p.o. daily. 5. Aspirin 81 mg 1 p.o. daily. 6. Vitamin D3 1000 international units 2 p.o. b.i.d.  ALLERGIES:  NKDA.  FAMILY HISTORY:  Positive for coronary artery disease.  SOCIAL HISTORY:  Positive for tobacco.  She has roughly 80-90 pack year smoking history.  No alcohol.  No recreational drug use.  REVIEW OF SYSTEMS:  CONSTITUTIONAL:  Negative for fever.  Positive for chills.  Negative for weakness.  Negative for fatigue.  CNS: No headaches, no seizures, no limb weakness.  ENT:  No nasal congestion, throat pain, or coryza.  CARDIOVASCULAR:   Positive for chest pain. Negative for palpitations.  Negative for orthopnea.  RESPIRATORY: Positive for chronic cough which has been increased lately.  Positive for production of purulent sputum.  No shortness of breath, no wheezes per the patient.  GASTROINTESTINAL:  Positive for nausea, positive for vomiting.  Negative for constipation, negative for diarrhea. GENITOURINARY:  No dysuria, no hematuria.  No urinary frequency.  RENAL: No flank pain.  No swelling, no pruritus.  SKIN:  No rashes, sores, or lesions.  HEMATOLOGIC:  No easy bruising, no purpura, no clots.  LYMPH: No lymphadenopathy.  No painful nodes.  No specific lymph swelling. PSYCHIATRIC:  No anxiety, no depression, no insomnia.  PHYSICAL EXAMINATION:  VITAL SIGNS:  Temperature 98.6, pulse 94, respiratory rate 22, O2 sat 94% on 2 liters.  Blood pressure 143/83. GENERAL:  The patient is elderly.  She is well-developed, well- nourished.  She is awake, alert, and oriented x3 and able to give a fair history. HEENT:  Eyes:  Pupils equal, round, and reactive to light and accommodation.  External ocular movements bilaterally intact.  Sclerae nonicteric, noninjected.  Mouth:  Oral mucosa moist.  No lesions, no sores.  Pharynx clear, no erythema, no exudate. NECK:  Negative for JVD.  Negative for thyromegaly.  Negative for lymphadenopathy. HEART:  Regular rate and rhythm at 90 beats per minute without murmur, ectopy, or gallops.  No lateral PMI.  No thrills. LUNGS:  Positive for wheezes bilaterally.  There is some rhonchi.  No rales, no increased work of breathing.  No tactile fremitus. ABDOMEN:  Soft, nontender, nondistended.  Positive bowel sounds.  No hepatosplenomegaly.  No hernias palpated. EXTREMITIES:  Negative for cyanosis, clubbing, or edema.  The patient has diminished dorsalis pedis and popliteal pulses bilaterally.  No carotid bruits bilaterally.  NEUROLOGICAL:  Cranial nerves II-XII grossly intact.  Motor and sensory  intact.  LABORATORY DATA:  EKG shows normal sinus rhythm and is unchanged from previous.  Portable chest is negative for acute cardiopulmonary pathology.  WBC is 6.6, hemoglobin 15.8, hematocrit 44.7, platelets 231,000.  Sodium 138, potassium 4.5, chloride 104, CO2 20, BUN 13, creatinine 0.66, glucose 211.  AST 17, ALT 15, alk phos 98.  ASSESSMENT:  Chest pain.  Differential diagnosis includes: 1. Acute coronary syndrome.  The patient has significant risk factors     for chest pain that can come from her heart.  These include her     very significant tobacco abuse history, her family history of heart     disease in her mother, diabetes mellitus, hyperlipidemia. 2. Acute bronchitis secondary to recent viral syndrome. 3. Diabetes mellitus. 4. Hyperlipidemia. 5. Tobacco abuse.  PLAN: 1. Admit to telemetry. 2. Rule out MI by EKG and __________ criteria. 3. Consult Cardiology. 4. P.o. antibiotics, nebs, low-dose steroid taper. 5. One nicotine patch. 6. Fingerstick blood sugars, sliding scale insulin. 7. Continue home meds.          ______________________________ Fran Lowes, DO     AS/MEDQ  D:  08/01/2010  T:  08/02/2010  Job:  [id number]  Electronically Signed by Fran Lowes DO on 10/03/2010 01:53:22 PM

## 2010-10-27 LAB — BASIC METABOLIC PANEL
BUN: 12
CO2: 23
Calcium: 9.7
Creatinine, Ser: 0.72
GFR calc non Af Amer: >60
Glucose, Bld: 214 — ABNORMAL HIGH
Sodium: 137

## 2010-10-27 LAB — TYPE AND SCREEN
ABO/RH(D): O POS
Antibody Screen: NEGATIVE

## 2010-10-27 LAB — CBC
MCHC: 33.9
Platelets: 292
RDW: 13.4

## 2010-11-02 LAB — CBC
MCV: 87.3
Platelets: 271
RDW: 14.1 — ABNORMAL HIGH
WBC: 9.8

## 2010-11-02 LAB — BASIC METABOLIC PANEL
BUN: 22
Creatinine, Ser: 1.13
GFR calc non Af Amer: 49 — ABNORMAL LOW

## 2010-11-29 ENCOUNTER — Encounter (INDEPENDENT_AMBULATORY_CARE_PROVIDER_SITE_OTHER): Payer: Medicare Other | Admitting: Surgery

## 2011-02-08 DIAGNOSIS — I10 Essential (primary) hypertension: Secondary | ICD-10-CM | POA: Diagnosis not present

## 2011-02-08 DIAGNOSIS — IMO0001 Reserved for inherently not codable concepts without codable children: Secondary | ICD-10-CM | POA: Diagnosis not present

## 2011-02-15 ENCOUNTER — Other Ambulatory Visit (HOSPITAL_COMMUNITY): Payer: Self-pay | Admitting: Family Medicine

## 2011-02-15 DIAGNOSIS — Z1231 Encounter for screening mammogram for malignant neoplasm of breast: Secondary | ICD-10-CM

## 2011-02-22 DIAGNOSIS — I10 Essential (primary) hypertension: Secondary | ICD-10-CM | POA: Diagnosis not present

## 2011-04-05 ENCOUNTER — Ambulatory Visit (HOSPITAL_COMMUNITY)
Admission: RE | Admit: 2011-04-05 | Discharge: 2011-04-05 | Disposition: A | Discharge status: Home / Self Care | Payer: Medicare Other | Source: Ambulatory Visit | Attending: Family Medicine | Admitting: Family Medicine

## 2011-04-05 DIAGNOSIS — Z1231 Encounter for screening mammogram for malignant neoplasm of breast: Secondary | ICD-10-CM | POA: Insufficient documentation

## 2011-04-30 DIAGNOSIS — E7800 Pure hypercholesterolemia, unspecified: Secondary | ICD-10-CM | POA: Diagnosis not present

## 2011-04-30 DIAGNOSIS — I10 Essential (primary) hypertension: Secondary | ICD-10-CM | POA: Diagnosis not present

## 2011-04-30 DIAGNOSIS — IMO0001 Reserved for inherently not codable concepts without codable children: Secondary | ICD-10-CM | POA: Diagnosis not present

## 2011-04-30 DIAGNOSIS — F17200 Nicotine dependence, unspecified, uncomplicated: Secondary | ICD-10-CM | POA: Diagnosis not present

## 2011-05-07 DIAGNOSIS — IMO0001 Reserved for inherently not codable concepts without codable children: Secondary | ICD-10-CM | POA: Diagnosis not present

## 2011-08-08 DIAGNOSIS — I10 Essential (primary) hypertension: Secondary | ICD-10-CM | POA: Diagnosis not present

## 2011-08-08 DIAGNOSIS — E785 Hyperlipidemia, unspecified: Secondary | ICD-10-CM | POA: Diagnosis not present

## 2011-08-08 DIAGNOSIS — F17200 Nicotine dependence, unspecified, uncomplicated: Secondary | ICD-10-CM | POA: Diagnosis not present

## 2011-08-08 DIAGNOSIS — E119 Type 2 diabetes mellitus without complications: Secondary | ICD-10-CM | POA: Diagnosis not present

## 2011-09-13 ENCOUNTER — Encounter: Payer: Self-pay | Admitting: Family Medicine

## 2011-09-13 ENCOUNTER — Ambulatory Visit (INDEPENDENT_AMBULATORY_CARE_PROVIDER_SITE_OTHER): Payer: Medicare Other | Admitting: Family Medicine

## 2011-09-13 VITALS — BP 138/88 | HR 112 | Temp 99.0°F | Resp 20 | Ht 66.5 in | Wt 158.0 lb

## 2011-09-13 DIAGNOSIS — IMO0001 Reserved for inherently not codable concepts without codable children: Secondary | ICD-10-CM

## 2011-09-13 DIAGNOSIS — I10 Essential (primary) hypertension: Secondary | ICD-10-CM | POA: Insufficient documentation

## 2011-09-13 DIAGNOSIS — M5136 Other intervertebral disc degeneration, lumbar region: Secondary | ICD-10-CM | POA: Insufficient documentation

## 2011-09-13 DIAGNOSIS — F17200 Nicotine dependence, unspecified, uncomplicated: Secondary | ICD-10-CM

## 2011-09-13 DIAGNOSIS — E119 Type 2 diabetes mellitus without complications: Secondary | ICD-10-CM | POA: Insufficient documentation

## 2011-09-13 DIAGNOSIS — Z720 Tobacco use: Secondary | ICD-10-CM | POA: Insufficient documentation

## 2011-09-13 LAB — POCT GLYCOSYLATED HEMOGLOBIN (HGB A1C): Hemoglobin A1C: 7.5

## 2011-09-13 MED ORDER — PRAVASTATIN SODIUM 40 MG PO TABS: ORAL_TABLET | ORAL | Status: DC

## 2011-09-13 MED ORDER — HYDROCHLOROTHIAZIDE 12.5 MG PO CAPS: 12.5000 mg | ORAL_CAPSULE | Freq: Every day | ORAL | Status: DC

## 2011-09-13 MED ORDER — GLIMEPIRIDE 2 MG PO TABS: 2.0000 mg | ORAL_TABLET | Freq: Two times a day (BID) | ORAL | Status: DC

## 2011-09-13 MED ORDER — LISINOPRIL 5 MG PO TABS: 10.0000 mg | ORAL_TABLET | Freq: Every day | ORAL | Status: DC

## 2011-09-13 MED ORDER — METFORMIN HCL ER 500 MG PO TB24: 500.0000 mg | ORAL_TABLET | Freq: Every day | ORAL | Status: DC

## 2011-09-13 MED ORDER — METFORMIN HCL ER 500 MG PO TB24: ORAL_TABLET | ORAL | Status: DC

## 2011-09-13 NOTE — Progress Notes (Addendum)
S: This 66 y.o. AA female is new to [facility] ( last seen here in 2008). She has Type II DM (recently started on Insulin), HTN and hyperlipidemia. She is a long term smoker. FSBS at home+ 100-170. She denies hypoglycemia. ( Last seen at [facility] on 08/08/11 per pt. She has a copy of her records which she will bring to next visit). She states compliance with all medications. She denies unexplained weight loss, fever or chills, weakness, CP, palpitations, chest tightness, SOB or cough (though she smokes), edema, HA, dizziness or syncope.  ROS: As per HPI.   O: Filed Vitals:   09/13/11 1113  BP: 138/88  Pulse: 112  Temp: 99 F (37.2 C)  Resp: 20   GEN: In NAD; smells of cigarette smoke HENT: [place]/AT; EOMI, conj/scl clear COR: RRR; no edema LUNGS: Normal resp rate and effort BACK: Lumbar area- tender with mild palpation.  NEURO: A&O x 3; CNs grossly intact; no deficits  Results for orders placed in visit on 09/13/11  POCT GLYCOSYLATED HEMOGLOBIN (HGB A1C)      Component Value Range   Hemoglobin A1C 7.5       A/P: 1. Type II or unspecified type diabetes mellitus without mention of complication, uncontrolled  Diabetic meal planning and continue current medications  2. HTN (hypertension) - stable RF medications  3. Tobacco user - pt states she is trying to "cut down" Advised cessation and discussed consequences of continued tobacco use   All refills phoned to pharmacy of record; 90-day supply with 1 RF.  DMV Handicap form completed; pt has ambulatory difficulty due to Lumbar surgery (she states has has a plate in lower spine).

## 2011-09-13 NOTE — Patient Instructions (Addendum)
Smoking Cessation This document explains the best ways for you to quit smoking and new treatments to help. It lists new medicines that can double or triple your chances of quitting and quitting for good. It also considers ways to avoid relapses and concerns you may have about quitting, including weight gain. NICOTINE: A POWERFUL ADDICTION If you have tried to quit smoking, you know how hard it can be. It is hard because nicotine is a very addictive drug. For some people, it can be as addictive as heroin or cocaine. Usually, people make 2 or 3 tries, or more, before finally being able to quit. Each time you try to quit, you can learn about what helps and what hurts. Quitting takes hard work and a lot of effort, but you can quit smoking. QUITTING SMOKING IS ONE OF THE MOST IMPORTANT THINGS YOU WILL EVER DO.  You will live longer, feel better, and live better.   The impact on your body of quitting smoking is felt almost immediately:   Within 20 minutes, blood pressure decreases. Pulse returns to its normal level.   After 8 hours, carbon monoxide levels in the blood return to normal. Oxygen level increases.   After 24 hours, chance of heart attack starts to decrease. Breath, hair, and body stop smelling like smoke.   After 48 hours, damaged nerve endings begin to recover. Sense of taste and smell improve.   After 72 hours, the body is virtually free of nicotine. Bronchial tubes relax and breathing becomes easier.   After 2 to 12 weeks, lungs can hold more air. Exercise becomes easier and circulation improves.   Quitting will reduce your risk of having a heart attack, stroke, cancer, or lung disease:   After 1 year, the risk of coronary heart disease is cut in half.   After 5 years, the risk of stroke falls to the same as a nonsmoker.   After 10 years, the risk of lung cancer is cut in half and the risk of other cancers decreases significantly.   After 15 years, the risk of coronary heart  disease drops, usually to the level of a nonsmoker.   If you are pregnant, quitting smoking will improve your chances of having a healthy baby.   The people you live with, especially your children, will be healthier.   You will have extra money to spend on things other than cigarettes.  FIVE KEYS TO QUITTING Studies have shown that these 5 steps will help you quit smoking and quit for good. You have the best chances of quitting if you use them together: 1. Get ready.  2. Get support and encouragement.  3. Learn new skills and behaviors.  4. Get medicine to reduce your nicotine addiction and use it correctly.  5. Be prepared for relapse or difficult situations. Be determined to continue trying to quit, even if you do not succeed at first.  1. GET READY  Set a quit date.   Change your environment.   Get rid of ALL cigarettes, ashtrays, matches, and lighters in your home, car, and place of work.   Do not let people smoke in your home.   Review your past attempts to quit. Think about what worked and what did not.   Once you quit, do not smoke. NOT EVEN A PUFF!  2. GET SUPPORT AND ENCOURAGEMENT Studies have shown that you have a better chance of being successful if you have help. You can get support in many ways.  Tell   your family, friends, and coworkers that you are going to quit and need their support. Ask them not to smoke around you.   Talk to your caregivers (doctor, dentist, nurse, pharmacist, psychologist, and/or smoking counselor).   Get individual, group, or telephone counseling and support. The more counseling you have, the better your chances are of quitting. Programs are available at local hospitals and health centers. Call your local health department for information about programs in your area.   Spiritual beliefs and practices may help some smokers quit.   Quit meters are small computer programs online or downloadable that keep track of quit statistics, such as amount  of "quit-time," cigarettes not smoked, and money saved.   Many smokers find one or more of the many self-help books available useful in helping them quit and stay off tobacco.  3. LEARN NEW SKILLS AND BEHAVIORS  Try to distract yourself from urges to smoke. Talk to someone, go for a walk, or occupy your time with a task.   When you first try to quit, change your routine. Take a different route to work. Drink tea instead of coffee. Eat breakfast in a different place.   Do something to reduce your stress. Take a hot bath, exercise, or read a book.   Plan something enjoyable to do every day. Reward yourself for not smoking.   Explore interactive web-based programs that specialize in helping you quit.  4. GET MEDICINE AND USE IT CORRECTLY Medicines can help you stop smoking and decrease the urge to smoke. Combining medicine with the above behavioral methods and support can quadruple your chances of successfully quitting smoking. The [place]. Food and Drug Administration (FDA) has approved 7 medicines to help you quit smoking. These medicines fall into 3 categories.  Nicotine replacement therapy (delivers nicotine to your body without the negative effects and risks of smoking):   Nicotine gum: Available over-the-counter.   Nicotine lozenges: Available over-the-counter.   Nicotine inhaler: Available by prescription.   Nicotine nasal spray: Available by prescription.   Nicotine skin patches (transdermal): Available by prescription and over-the-counter.   Antidepressant medicine (helps people abstain from smoking, but how this works is unknown):   Bupropion sustained-release (SR) tablets: Available by prescription.   Nicotinic receptor partial agonist (simulates the effect of nicotine in your brain):   Varenicline tartrate tablets: Available by prescription.   Ask your caregiver for advice about which medicines to use and how to use them. Carefully read the information on the package.    Everyone who is trying to quit may benefit from using a medicine. If you are pregnant or trying to become pregnant, nursing an infant, you are under age 18, or you smoke fewer than 10 cigarettes per day, talk to your caregiver before taking any nicotine replacement medicines.   You should stop using a nicotine replacement product and call your caregiver if you experience nausea, dizziness, weakness, vomiting, fast or irregular heartbeat, mouth problems with the lozenge or gum, or redness or swelling of the skin around the patch that does not go away.   Do not use any other product containing nicotine while using a nicotine replacement product.   Talk to your caregiver before using these products if you have diabetes, heart disease, asthma, stomach ulcers, you had a recent heart attack, you have high blood pressure that is not controlled with medicine, a history of irregular heartbeat, or you have been prescribed medicine to help you quit smoking.  5. BE PREPARED FOR RELAPSE OR   DIFFICULT SITUATIONS  Most relapses occur within the first 3 months after quitting. Do not be discouraged if you start smoking again. Remember, most people try several times before they finally quit.   You may have symptoms of withdrawal because your body is used to nicotine. You may crave cigarettes, be irritable, feel very hungry, cough often, get headaches, or have difficulty concentrating.   The withdrawal symptoms are only temporary. They are strongest when you first quit, but they will go away within 10 to 14 days.  Here are some difficult situations to watch for:  Alcohol. Avoid drinking alcohol. Drinking lowers your chances of successfully quitting.   Caffeine. Try to reduce the amount of caffeine you consume. It also lowers your chances of successfully quitting.   Other smokers. Being around smoking can make you want to smoke. Avoid smokers.   Weight gain. Many smokers will gain weight when they quit, usually  less than 10 pounds. Eat a healthy diet and stay active. Do not let weight gain distract you from your main goal, quitting smoking. Some medicines that help you quit smoking may also help delay weight gain. You can always lose the weight gained after you quit.   Bad mood or depression. There are a lot of ways to improve your mood other than smoking.  If you are having problems with any of these situations, talk to your caregiver. SPECIAL SITUATIONS AND CONDITIONS Studies suggest that everyone can quit smoking. Your situation or condition can give you a special reason to quit.  Pregnant women/new mothers: By quitting, you protect your baby's health and your own.   Hospitalized patients: By quitting, you reduce health problems and help healing.   Heart attack patients: By quitting, you reduce your risk of a second heart attack.   Lung, head, and neck cancer patients: By quitting, you reduce your chance of a second cancer.   Parents of children and adolescents: By quitting, you protect your children from illnesses caused by secondhand smoke.  QUESTIONS TO THINK ABOUT Think about the following questions before you try to stop smoking. You may want to talk about your answers with your caregiver.  Why do you want to quit?   If you tried to quit in the past, what helped and what did not?   What will be the most difficult situations for you after you quit? How will you plan to handle them?   Who can help you through the tough times? Your family? Friends? Caregiver?   What pleasures do you get from smoking? What ways can you still get pleasure if you quit?  Here are some questions to ask your caregiver:  How can you help me to be successful at quitting?   What medicine do you think would be best for me and how should I take it?   What should I do if I need more help?   What is smoking withdrawal like? How can I get information on withdrawal?  Quitting takes hard work and a lot of effort,  but you can quit smoking. FOR MORE INFORMATION  Smokefree.gov (http://www.davis-sullivan.com/) provides free, accurate, evidence-based information and professional assistance to help support the immediate and long-term needs of people trying to quit smoking. Document Released: 12/26/2000 Document Revised: 12/21/2010 Document Reviewed: 10/18/2008 [facility] Patient Information 2012 [place], [place].    Diabetes Meal Planning Guide The diabetes meal planning guide is a tool to help you plan your meals and snacks. It is important for people with diabetes to  manage their blood glucose (sugar) levels. Choosing the right foods and the right amounts throughout your day will help control your blood glucose. Eating right can even help you improve your blood pressure and reach or maintain a healthy weight. CARBOHYDRATE COUNTING MADE EASY When you eat carbohydrates, they turn to sugar. This raises your blood glucose level. Counting carbohydrates can help you control this level so you feel better. When you plan your meals by counting carbohydrates, you can have more flexibility in what you eat and balance your medicine with your food intake. Carbohydrate counting simply means adding up the total amount of carbohydrate grams in your meals and snacks. Try to eat about the same amount at each meal. Foods with carbohydrates are listed below. Each portion below is 1 carbohydrate serving or 15 grams of carbohydrates. Ask your dietician how many grams of carbohydrates you should eat at each meal or snack. Grains and Starches  1 slice bread.    English muffin or hotdog/hamburger bun.    cup cold cereal (unsweetened).   ? cup cooked pasta or rice.    cup starchy vegetables (corn, potatoes, peas, beans, winter squash).   1 tortilla (6 inches).    bagel.   1 waffle or pancake (size of a CD).    cup cooked cereal.   4 to 6 small crackers.  *Whole grain is recommended. Fruit  1 cup fresh unsweetened berries,  melon, papaya, pineapple.   1 small fresh fruit.    banana or mango.    cup fruit juice (4 oz unsweetened).    cup canned fruit in natural juice or water.   2 tbs dried fruit.   12 to 15 grapes or cherries.  Milk and Yogurt  1 cup fat-free or 1% milk.   1 cup soy milk.   6 oz light yogurt with sugar-free sweetener.   6 oz low-fat soy yogurt.   6 oz plain yogurt.  Vegetables  1 cup raw or  cup cooked is counted as 0 carbohydrates or a "free" food.   If you eat 3 or more servings at 1 meal, count them as 1 carbohydrate serving.  Other Carbohydrates   oz chips or pretzels.    cup ice cream or frozen yogurt.    cup sherbet or sorbet.   2 inch square cake, no frosting.   1 tbs honey, sugar, jam, jelly, or syrup.   2 small cookies.   3 squares of graham crackers.   3 cups popcorn.   6 crackers.   1 cup broth-based soup.   Count 1 cup casserole or other mixed foods as 2 carbohydrate servings.   Foods with less than 20 calories in a serving may be counted as 0 carbohydrates or a "free" food.  You may want to purchase a book or computer software that lists the carbohydrate gram counts of different foods. In addition, the nutrition facts panel on the labels of the foods you eat are a good source of this information. The label will tell you how big the serving size is and the total number of carbohydrate grams you will be eating per serving. Divide this number by 15 to obtain the number of carbohydrate servings in a portion. Remember, 1 carbohydrate serving equals 15 grams of carbohydrate. SERVING SIZES Measuring foods and serving sizes helps you make sure you are getting the right amount of food. The list below tells how big or small some common serving sizes are.  1 oz.........4 stacked dice.  3 oz........Marland KitchenDeck of cards.   1 tsp.......Marland KitchenTip of little finger.   1 tbs......Marland KitchenMarland KitchenThumb.   2 tbs.......Marland KitchenGolf ball.    cup......Marland KitchenHalf of a fist.   1  cup.......Marland KitchenA fist.  SAMPLE DIABETES MEAL PLAN Below is a sample meal plan that includes foods from the grain and starches, dairy, vegetable, fruit, and meat groups. A dietician can individualize a meal plan to fit your calorie needs and tell you the number of servings needed from each food group. However, controlling the total amount of carbohydrates in your meal or snack is more important than making sure you include all of the food groups at every meal. You may interchange carbohydrate containing foods (dairy, starches, and fruits). The meal plan below is an example of a 2000 calorie diet using carbohydrate counting. This meal plan has 17 carbohydrate servings. Breakfast  1 cup oatmeal (2 carb servings).    cup light yogurt (1 carb serving).   1 cup blueberries (1 carb serving).    cup almonds.  Snack  1 large apple (2 carb servings).   1 low-fat string cheese stick.  Lunch  Chicken breast salad.   1 cup spinach.    cup chopped tomatoes.   2 oz chicken breast, sliced.   2 tbs low-fat [place] dressing.   12 whole-wheat crackers (2 carb servings).   12 to 15 grapes (1 carb serving).   1 cup low-fat milk (1 carb serving).  Snack  1 cup carrots.    cup hummus (1 carb serving).  Dinner  3 oz broiled salmon.   1 cup brown rice (3 carb servings).  Snack  1  cups steamed broccoli (1 carb serving) drizzled with 1 tsp olive oil and lemon juice.   1 cup light pudding (2 carb servings).  DIABETES MEAL PLANNING WORKSHEET Your dietician can use this worksheet to help you decide how many servings of foods and what types of foods are right for you.  BREAKFAST Food Group and Servings / Carb Servings Grain/Starches __________________________________ Dairy __________________________________________ Vegetable ______________________________________ Fruit ___________________________________________ Meat __________________________________________ Fat  ____________________________________________ LUNCH Food Group and Servings / Carb Servings Grain/Starches ___________________________________ Dairy ___________________________________________ Fruit ____________________________________________ Meat ___________________________________________ Fat _____________________________________________ Laural Golden Food Group and Servings / Carb Servings Grain/Starches ___________________________________ Dairy ___________________________________________ Fruit ____________________________________________ Meat ___________________________________________ Fat _____________________________________________ SNACKS Food Group and Servings / Carb Servings Grain/Starches ___________________________________ Dairy ___________________________________________ Vegetable _______________________________________ Fruit ____________________________________________ Meat ___________________________________________ Fat _____________________________________________ DAILY TOTALS Starches _________________________ Vegetable ________________________ Fruit ____________________________ Dairy ____________________________ Meat ____________________________ Fat ______________________________ Document Released: 09/28/2004 Document Revised: 12/21/2010 Document Reviewed: 08/09/2008 [employer] Patient Information 2012 [place], [place].

## 2011-09-19 ENCOUNTER — Telehealth: Payer: Self-pay | Admitting: Family Medicine

## 2011-09-19 NOTE — Telephone Encounter (Signed)
Pt had concerns about the cost of Metformin. Before, her Rx for this was written for: Metformin, 1000 mg, 1 tablet bid. At last OV, it was written for: Metformin 500 mg, 2 tablets bid. The first Rx didn't cost her anything, whereas the new Rx does. She would like to have it written as it was before. Also, she stated that it has been a year or more since she took Glimepiride and wondered if she has to take it now? If so, she would like the cheapest option there is as she is on a fixed income. Please advise

## 2011-09-20 MED ORDER — METFORMIN HCL 1000 MG PO TABS: 1000.0000 mg | ORAL_TABLET | Freq: Two times a day (BID) | ORAL | Status: DC

## 2011-09-20 NOTE — Telephone Encounter (Signed)
I will print a new RX for Metformin 1000 mg 1 tablet bid. She can pick it up from [facility] to mail to her drug company. I do not think she needs Glimepiride right now if she follows a meal plann and tries to be more active. Her last A1C=7.5%. Will recheck this at her next visit.

## 2011-09-28 DIAGNOSIS — Z23 Encounter for immunization: Secondary | ICD-10-CM | POA: Diagnosis not present

## 2011-10-18 ENCOUNTER — Encounter: Payer: Self-pay | Admitting: Family Medicine

## 2011-11-15 ENCOUNTER — Other Ambulatory Visit: Payer: Self-pay | Admitting: Family Medicine

## 2011-11-15 NOTE — Telephone Encounter (Signed)
Pt states she is having trouble getting in touch with anyone from [facility]. Her strips run out in about two weeks and they still have not sent her anything from her last scripts. She would like another script sent to [facility]. She needs lancets and One Touch Ultra 2 strips. She checks her sugar twice daily. Faxed forms for refills from [facility] is attached to patient's chart.

## 2011-11-15 NOTE — Telephone Encounter (Signed)
I completed form and [facility] staff will fax back.

## 2011-11-27 ENCOUNTER — Telehealth: Payer: Self-pay | Admitting: Family Medicine

## 2011-11-27 NOTE — Telephone Encounter (Signed)
Pt says that [facility] did not receive the last Rx you wrote for her Lancets and strips. She needs another Rx written and faxed to them. [facility] phone # [phone]. Fax # [phone]. I spoke with Fritzi Mandes at [facility] and she informed me that the fax number the pt gave was incorrect, therefore, they did not receive the order.

## 2011-11-28 ENCOUNTER — Telehealth: Payer: Self-pay | Admitting: *Deleted

## 2011-11-28 NOTE — Telephone Encounter (Signed)
Faxed physician's order for blood glucose testing supplies to [facility] confirmation page rec"d-successful.

## 2011-11-28 NOTE — Telephone Encounter (Signed)
Form completed and staff at [facility] to fax back to [facility].

## 2011-12-20 ENCOUNTER — Ambulatory Visit (INDEPENDENT_AMBULATORY_CARE_PROVIDER_SITE_OTHER): Payer: Medicare Other | Admitting: Family Medicine

## 2011-12-20 ENCOUNTER — Encounter: Payer: Self-pay | Admitting: Family Medicine

## 2011-12-20 VITALS — BP 110/52 | HR 100 | Temp 98.2°F | Resp 16 | Ht 66.5 in | Wt 159.8 lb

## 2011-12-20 DIAGNOSIS — IMO0001 Reserved for inherently not codable concepts without codable children: Secondary | ICD-10-CM | POA: Diagnosis not present

## 2011-12-20 DIAGNOSIS — Z23 Encounter for immunization: Secondary | ICD-10-CM

## 2011-12-20 DIAGNOSIS — I10 Essential (primary) hypertension: Secondary | ICD-10-CM

## 2011-12-20 NOTE — Progress Notes (Signed)
S:  This pt has HTN andType II DM and takes oral medication as well as using Insulin. She has FSBS 90s-130s. Not followoing a meal plan; notes blood sugar seems to fall and she gets hypoglycemic late in AM if she takes Glimepiride tablet as directed. She will omit AM dose of this medication some days. Nutrition consists of breakfast ~ 9 AM and then next meal is ~ 2 PM. She does not have snacks.  ROS: Negative for abnormal weight loss, diaphoresis, appetite change, CP or tightness, palpitations, edema, SOB or cough, DOE, dizziness, numbness, weakness or syncope.  O:  Filed Vitals:   12/20/11 1311  BP: 110/52  Pulse: 100  Temp: 98.2 F (36.8 C)  Resp: 16   GEN: In NAD: WN,WD. HEENT: [place]/AT; EOMI w/ clear conj and muddy sclerae. Otherwise norma except dental abnormalities. COR: RRR. LUNGS: Normal resp rate and effort. NEURO: A&O x 3; CNs intact. Nonfocal.  Results for orders placed in visit on 12/20/11  POCT GLYCOSYLATED HEMOGLOBIN (HGB A1C)      Component Value Range   Hemoglobin A1C 6.7       A/P: 1. Type II or unspecified type diabetes mellitus without mention of complication, uncontrolled  Controlled much improved Decrease Glimepiride 2 mg to 1/2 tab bid w/ meals. Include snacks during the day.  2. Need for prophylactic vaccination against Streptococcus pneumoniae (pneumococcus)    3. HTN, goal below 130/80  Stable; no med change.   Pt given RX: Zostavax ( she will get vaccine in Jan 2014).

## 2011-12-20 NOTE — Patient Instructions (Signed)
Your Diabetes is under very good control. I would suggest you take Glimepiride 1/2 tablet twice a day with meals. Try to eat regularly and have healthy snacks between meals.

## 2012-01-31 ENCOUNTER — Telehealth: Payer: Self-pay

## 2012-01-31 ENCOUNTER — Other Ambulatory Visit: Payer: Self-pay | Admitting: Family Medicine

## 2012-01-31 MED ORDER — INSULIN DETEMIR 100 UNIT/ML ~~LOC~~ SOLN: 20.0000 [IU] | Freq: Every day | SUBCUTANEOUS | Status: DC

## 2012-01-31 NOTE — Telephone Encounter (Signed)
I discontinued Lantus and ordered Levemir 20 units hs. Pt should not notice a significance difference in blood sugar readings but dose can be adjusted upward if needed.

## 2012-01-31 NOTE — Telephone Encounter (Signed)
I received a prior auth req for pt's Lantus from [facility]. Their preferred drug is Levemir and they want to know if the pt has ever tried/failed or intolerance to it, or if it is contraindicated for some reason. I called pt because she was started on insulin prior to est care at our practice. Pt reports that the Lantus is the only insulin she has ever been Rxd. Pt stated she doesn't care which insulin is Rxd as long as she gets some that will work for her. I advised pt that I would check w/Dr Audria Nine and ask if she wants to change Rx to Levemir, and if so, advised pt to monitor her BS on the Levemir and call us back if it is not effective. Pt agreed.  Dr Audria Nine, do you want to send in a Rx for Levemir to [facility]? Pt reports that she has been using 20 units of the Lantus Qhs.

## 2012-02-04 NOTE — Telephone Encounter (Signed)
Notified pt of new Rx being sent in and D/W pt instr's for adjustment if needed and advised her to CB if she has any trouble w/BS readings on new medication. Pt agreed.

## 2012-02-20 ENCOUNTER — Telehealth: Payer: Self-pay

## 2012-02-20 NOTE — Telephone Encounter (Signed)
PATIENT WOULD LIKE TO LET DR. MCPHERSON KNOW THAT [facility] WILL BE SENDING A PER SCRIPTION REQUEST OVER TO BE FILLED.  PLEASE CALL [phone] IF NEEDED.

## 2012-03-01 ENCOUNTER — Ambulatory Visit (INDEPENDENT_AMBULATORY_CARE_PROVIDER_SITE_OTHER): Payer: Medicare Other | Admitting: Family Medicine

## 2012-03-01 VITALS — BP 105/80 | HR 71 | Temp 99.7°F | Resp 18 | Wt 161.0 lb

## 2012-03-01 DIAGNOSIS — M7918 Myalgia, other site: Secondary | ICD-10-CM

## 2012-03-01 DIAGNOSIS — IMO0001 Reserved for inherently not codable concepts without codable children: Secondary | ICD-10-CM | POA: Diagnosis not present

## 2012-03-01 DIAGNOSIS — L0291 Cutaneous abscess, unspecified: Secondary | ICD-10-CM | POA: Diagnosis not present

## 2012-03-01 MED ORDER — DOXYCYCLINE HYCLATE 100 MG PO TABS: 100.0000 mg | ORAL_TABLET | Freq: Two times a day (BID) | ORAL | Status: DC

## 2012-03-01 NOTE — Progress Notes (Signed)
[facility]:  Office Visit  Chief Complaint:  Chief Complaint  Patient presents with  . Cellulitis    buttocks    HPI: [name] is a 67 y.o. female who complains of  6 day history of right buttock pain, can't sit. Has tried hot showers. Denies fevers, chills. Has DM, HbA1c was 6.7 in 12/2011. Followed by Dr. Audria Nine. H/o abscess/cysts x 5,starting in childhood on buttocks requiring surgery. No MRSA.  Past Medical History  Diagnosis Date  . Night sweats   . Diabetes mellitus   . Hyperlipidemia   . Rectal polyp    Past Surgical History  Procedure Laterality Date  . Rectal polypectomy  05/16/2010    TVAdenoma polyp  . Back surgery      plate put in back   History   Social History  . Marital Status: Divorced    Spouse Name: N/A    Number of Children: N/A  . Years of Education: N/A   Social History Main Topics  . Smoking status: Current Every Day Smoker -- 1.50 packs/day  . Smokeless tobacco: None  . Alcohol Use: No  . Drug Use: No  . Sexually Active:    Other Topics Concern  . None   Social History Narrative  . None   No family history on file. No Known Allergies Prior to Admission medications   Medication Sig Start Date End Date Taking? Authorizing Provider  aspirin 81 MG tablet Take 81 mg by mouth daily.     Yes Historical Provider, MD  Cholecalciferol (VITAMIN D3) 1000 UNITS CAPS Take 2 tablets by mouth daily.     Yes Historical Provider, MD  glimepiride (AMARYL) 2 MG tablet Take 1 tablet (2 mg total) by mouth 2 (two) times daily. 09/13/11  Yes Maurice March, MD  hydrochlorothiazide (MICROZIDE) 12.5 MG capsule Take 1 capsule (12.5 mg total) by mouth daily. 09/13/11  Yes Maurice March, MD  insulin detemir (LEVEMIR) 100 UNIT/ML injection Inject 20 Units into the skin at bedtime. 01/31/12  Yes Maurice March, MD  lisinopril (PRINIVIL,ZESTRIL) 5 MG tablet Take 2 tablets (10 mg total) by mouth daily. 09/13/11  Yes Maurice March, MD  metFORMIN (GLUCOPHAGE) 1000 MG tablet Take 1 tablet (1,000 mg total) by mouth 2 (two) times daily with a meal. 09/20/11 09/19/12 Yes Maurice March, MD  pravastatin (PRAVACHOL) 40 MG tablet Take 1 tablet by mouth every PM after a meal. 09/13/11  Yes Maurice March, MD     ROS: The patient denies fevers, chills, night sweats, unintentional weight loss, chest pain, palpitations, wheezing, dyspnea on exertion, nausea, vomiting, abdominal pain, dysuria, hematuria, melena, numbness, weakness, or tingling.   All other systems have been reviewed and were otherwise negative with the exception of those mentioned in the HPI and as above.    PHYSICAL EXAM: Filed Vitals:   03/01/12 1634  BP: 105/80  Pulse: 71  Temp: 99.7 F (37.6 C)  Resp: 18   Filed Vitals:   03/01/12 1634  Weight: 161 lb (73.029 kg)   Body mass index is 25.6 kg/(m^2).  General: Alert, no acute distress HEENT:  Normocephalic, atraumatic, oropharynx patent.  Cardiovascular:  Regular rate and rhythm, no rubs murmurs or gallops.  No Carotid bruits, radial pulse intact. No pedal edema.  Respiratory: Clear to auscultation bilaterally.  No wheezes, rales, or rhonchi.  No cyanosis, no use of accessory musculature GI: No organomegaly, abdomen is soft and non-tender, positive bowel sounds.  No masses. Skin: Right buttock 5 x 2 cm abscess near midline, + warmth, + tender. Neurologic: Facial musculature symmetric. Psychiatric: Patient is appropriate throughout our interaction. Lymphatic: No cervical lymphadenopathy Musculoskeletal: Gait intact.   LABS: Results for orders placed in visit on 12/20/11  POCT GLYCOSYLATED HEMOGLOBIN (HGB A1C)      Result Value Range   Hemoglobin A1C 6.7       EKG/XRAY:   Primary read interpreted by Dr. Conley Rolls at [facility].   ASSESSMENT/PLAN: Encounter Diagnosis  Name Primary?  . Cellulitis and abscess Yes    Rx Doxycycline, Tylenol, NSAID prn  Will try NSAIDs for pain control,  if need something different will call us Warm compresses, sitz bath F/u and wound care as directed 48 hrs.    Hamilton Capri PHUONG, DO 03/01/2012 5:19 PM

## 2012-03-01 NOTE — Progress Notes (Signed)
Procedure Note: Verbal consent obtained.  Local anesthesia with 2 cc 2% lidocaine.  Betadine prep.  I initially attempted to aspirated the area as there was minimal fluctuance.  Purulent material returned on aspiration.  So I then incised with an 11 blade.  Copious purulence expressed.  Wound culture collected.  Wound irrigated with remaining anesthetic.  Packed with approx 12 cm 1/4 inch plain packing.  Cleansed and dressed.  Discussed wound care.  Pt tolerated well.

## 2012-03-03 ENCOUNTER — Ambulatory Visit (INDEPENDENT_AMBULATORY_CARE_PROVIDER_SITE_OTHER): Payer: Medicare Other | Admitting: Physician Assistant

## 2012-03-03 VITALS — BP 93/59 | HR 112 | Temp 98.6°F | Resp 16 | Ht 67.0 in | Wt 159.8 lb

## 2012-03-03 DIAGNOSIS — L0231 Cutaneous abscess of buttock: Secondary | ICD-10-CM | POA: Diagnosis not present

## 2012-03-03 DIAGNOSIS — L03317 Cellulitis of buttock: Secondary | ICD-10-CM | POA: Diagnosis not present

## 2012-03-03 NOTE — Progress Notes (Signed)
Patient ID: [name] MRN: [id number], DOB: 04/03/1945 67 y.o. Date of Encounter: 03/03/2012, 11:16 AM  Chief Complaint: Wound care   See previous note  HPI: 67 y.o. y/o female presents for wound care s/p I&D on 03/03/12. Doing well No issues or complaints Afebrile/ no chills No nausea or vomiting Tolerating doxycycline.  Pain improved.  Daily dressing change Previous note reviewed  Past Medical History  Diagnosis Date  . Night sweats   . Diabetes mellitus   . Hyperlipidemia   . Rectal polyp      Home Meds: Prior to Admission medications   Medication Sig Start Date End Date Taking? Authorizing Provider  aspirin 81 MG tablet Take 81 mg by mouth daily.     Yes Historical Provider, MD  Cholecalciferol (VITAMIN D3) 1000 UNITS CAPS Take 2 tablets by mouth daily.     Yes Historical Provider, MD  doxycycline (VIBRA-TABS) 100 MG tablet Take 1 tablet (100 mg total) by mouth 2 (two) times daily. 03/01/12  Yes Thao P Le, DO  glimepiride (AMARYL) 2 MG tablet Take 1 tablet (2 mg total) by mouth 2 (two) times daily. 09/13/11  Yes Maurice March, MD  hydrochlorothiazide (MICROZIDE) 12.5 MG capsule Take 1 capsule (12.5 mg total) by mouth daily. 09/13/11  Yes Maurice March, MD  insulin detemir (LEVEMIR) 100 UNIT/ML injection Inject 20 Units into the skin at bedtime. 01/31/12  Yes Maurice March, MD  lisinopril (PRINIVIL,ZESTRIL) 5 MG tablet Take 2 tablets (10 mg total) by mouth daily. 09/13/11  Yes Maurice March, MD  metFORMIN (GLUCOPHAGE) 1000 MG tablet Take 1 tablet (1,000 mg total) by mouth 2 (two) times daily with a meal. 09/20/11 09/19/12 Yes Maurice March, MD  pravastatin (PRAVACHOL) 40 MG tablet Take 1 tablet by mouth every PM after a meal. 09/13/11  Yes Maurice March, MD    Allergies: No Known Allergies  ROS: Constitutional: Afebrile, no chills Cardiovascular: negative for chest pain or palpitations Dermatological: Positive for wound. Negative for erythema,  pain, or warmth.  GI: No nausea or vomiting   EXAM: Physical Exam: Blood pressure 93/59, pulse 112, temperature 98.6 F (37 C), temperature source Oral, resp. rate 16, height 5\' 7"  (1.702 m), weight 159 lb 12.8 oz (72.485 kg), SpO2 97.00%., Body mass index is 25.02 kg/(m^2). General: Well developed, well nourished, in no acute distress. Nontoxic appearing. Head: Normocephalic, atraumatic, sclera non-icteric.  Neck: Supple. Lungs: Breathing is unlabored. Heart: Normal rate. Skin:  Warm and moist. Dressing and packing in place. No induration, erythema, or tenderness to palpation. Neuro: Alert and oriented X 3. Moves all extremities spontaneously. Normal gait.  Psych:  Responds to questions appropriately with a normal affect.       PROCEDURE: Dressing and packing removed. No purulence expressed Wound bed healthy Irrigated with 1% plain lidocaine 5 cc. Repacked with 1/4 plain packing.  Dressing applied  LAB: Culture: no growth x 1 day  A/P: 67 y.o. y/o female with buttocks cellulitis/abscess as above s/p I&D on 2/17//14. Wound care per above Continue doxycycline. Pain well controlled Daily dressing changes Recheck 48 hours  Signed, Rhoderick Moody, PA-C 03/03/2012 11:16 AM

## 2012-03-04 ENCOUNTER — Other Ambulatory Visit: Payer: Self-pay | Admitting: Family Medicine

## 2012-03-04 DIAGNOSIS — Z1231 Encounter for screening mammogram for malignant neoplasm of breast: Secondary | ICD-10-CM

## 2012-03-04 LAB — WOUND CULTURE: Organism ID, Bacteria: NO GROWTH

## 2012-03-05 ENCOUNTER — Ambulatory Visit (INDEPENDENT_AMBULATORY_CARE_PROVIDER_SITE_OTHER): Payer: Medicare Other | Admitting: Physician Assistant

## 2012-03-05 VITALS — BP 124/76 | HR 109 | Temp 98.0°F | Resp 17 | Ht 66.5 in | Wt 160.0 lb

## 2012-03-05 DIAGNOSIS — L0231 Cutaneous abscess of buttock: Secondary | ICD-10-CM

## 2012-03-05 NOTE — Progress Notes (Signed)
   Patient ID: [name] MRN: [id number], DOB: 1945/07/15 67 y.o. Date of Encounter: 03/05/2012, 1:05 PM  Primary Physician: Dow Adolph, MD  Chief Complaint: Wound care   See previous note  HPI: 67 y.o. y/o female presents for wound care s/p I&D on 03/01/12 Doing well No issues or complaints Afebrile/ no chills No nausea or vomiting Tolerating Doxycycline Pain resolved "I feel 1,000 times better." Daily dressing change Previous note reviewed  Past Medical History  Diagnosis Date  . Night sweats   . Diabetes mellitus   . Hyperlipidemia   . Rectal polyp      Home Meds: Prior to Admission medications   Medication Sig Start Date End Date Taking? Authorizing Provider  aspirin 81 MG tablet Take 81 mg by mouth daily.     Yes Historical Provider, MD  Cholecalciferol (VITAMIN D3) 1000 UNITS CAPS Take 2 tablets by mouth daily.     Yes Historical Provider, MD  doxycycline (VIBRA-TABS) 100 MG tablet Take 1 tablet (100 mg total) by mouth 2 (two) times daily. 03/01/12  Yes Thao P Le, DO  glimepiride (AMARYL) 2 MG tablet Take 1 tablet (2 mg total) by mouth 2 (two) times daily. 09/13/11  Yes Maurice March, MD  hydrochlorothiazide (MICROZIDE) 12.5 MG capsule Take 1 capsule (12.5 mg total) by mouth daily. 09/13/11  Yes Maurice March, MD  insulin detemir (LEVEMIR) 100 UNIT/ML injection Inject 20 Units into the skin at bedtime. 01/31/12  Yes Maurice March, MD  lisinopril (PRINIVIL,ZESTRIL) 5 MG tablet Take 2 tablets (10 mg total) by mouth daily. 09/13/11  Yes Maurice March, MD  metFORMIN (GLUCOPHAGE) 1000 MG tablet Take 1 tablet (1,000 mg total) by mouth 2 (two) times daily with a meal. 09/20/11 09/19/12 Yes Maurice March, MD  pravastatin (PRAVACHOL) 40 MG tablet Take 1 tablet by mouth every PM after a meal. 09/13/11  Yes Maurice March, MD    Allergies: No Known Allergies  ROS: Constitutional: Afebrile, no chills Cardiovascular: negative for chest pain or  palpitations Dermatological: Positive for wound. Negative for erythema, pain, or warmth  GI: No nausea or vomiting   EXAM: Physical Exam: Blood pressure 124/76, pulse 109, temperature 98 F (36.7 C), temperature source Oral, resp. rate 17, height 5' 6.5" (1.689 m), weight 160 lb (72.576 kg), SpO2 94.00%., Body mass index is 25.44 kg/(m^2). General: Well developed, well nourished, in no acute distress. Nontoxic appearing. Head: Normocephalic, atraumatic, sclera non-icteric.  Neck: Supple. Lungs: Breathing is unlabored. Heart: Normal rate. Skin:  Warm and moist. Dressing in place. No induration, erythema, or tenderness to palpation. Neuro: Alert and oriented X 3. Moves all extremities spontaneously. Normal gait.  Psych:  Responds to questions appropriately with a normal affect.   PROCEDURE: Dressing removed No packing in wound No purulence expressed Wound bed healthy Irrigated with 1% plain lidocaine 5 cc. Repacked with 1/4 inch plain packing Dressing applied  LAB: Culture: No growth 2 days  A/P: 67 y.o. y/o female with cellulitis/abscess as above s/p I&D on 03/02/11 -Wound care per above -Continue Doxycycline -Pain well controlled -Daily dressing changes -Recheck 48 hours  Signed, Eula Listen, PA-C 03/05/2012 1:05 PM

## 2012-04-07 ENCOUNTER — Ambulatory Visit (HOSPITAL_COMMUNITY)
Admission: RE | Admit: 2012-04-07 | Discharge: 2012-04-07 | Disposition: A | Discharge status: Home / Self Care | Payer: Medicare Other | Source: Ambulatory Visit | Attending: Family Medicine | Admitting: Family Medicine

## 2012-04-07 DIAGNOSIS — Z1231 Encounter for screening mammogram for malignant neoplasm of breast: Secondary | ICD-10-CM | POA: Diagnosis not present

## 2012-04-08 ENCOUNTER — Other Ambulatory Visit: Payer: Self-pay | Admitting: Radiology

## 2012-04-08 MED ORDER — GLIMEPIRIDE 2 MG PO TABS: 2.0000 mg | ORAL_TABLET | Freq: Two times a day (BID) | ORAL | Status: DC

## 2012-04-08 MED ORDER — LISINOPRIL 5 MG PO TABS: 10.0000 mg | ORAL_TABLET | Freq: Every day | ORAL | Status: DC

## 2012-04-08 MED ORDER — HYDROCHLOROTHIAZIDE 12.5 MG PO CAPS: 12.5000 mg | ORAL_CAPSULE | Freq: Every day | ORAL | Status: DC

## 2012-04-08 MED ORDER — PRAVASTATIN SODIUM 40 MG PO TABS: ORAL_TABLET | ORAL | Status: DC

## 2012-04-08 NOTE — Telephone Encounter (Signed)
Patient called about her mail order Rx 's she has upcoming appt with Dr Audria Nine. meds are renewed for her.

## 2012-04-17 ENCOUNTER — Encounter: Payer: Self-pay | Admitting: Family Medicine

## 2012-04-17 ENCOUNTER — Ambulatory Visit (INDEPENDENT_AMBULATORY_CARE_PROVIDER_SITE_OTHER): Payer: Medicare Other | Admitting: Family Medicine

## 2012-04-17 VITALS — BP 132/69 | HR 93 | Temp 98.5°F | Resp 16 | Ht 67.0 in | Wt 169.0 lb

## 2012-04-17 DIAGNOSIS — E119 Type 2 diabetes mellitus without complications: Secondary | ICD-10-CM | POA: Diagnosis not present

## 2012-04-17 DIAGNOSIS — Z760 Encounter for issue of repeat prescription: Secondary | ICD-10-CM

## 2012-04-17 DIAGNOSIS — F17200 Nicotine dependence, unspecified, uncomplicated: Secondary | ICD-10-CM

## 2012-04-17 DIAGNOSIS — Z720 Tobacco use: Secondary | ICD-10-CM

## 2012-04-17 DIAGNOSIS — E785 Hyperlipidemia, unspecified: Secondary | ICD-10-CM | POA: Diagnosis not present

## 2012-04-17 DIAGNOSIS — I10 Essential (primary) hypertension: Secondary | ICD-10-CM

## 2012-04-17 LAB — BASIC METABOLIC PANEL
BUN: 16 mg/dL (ref 6–23)
Chloride: 104 mEq/L (ref 96–112)
Potassium: 4.9 mEq/L (ref 3.5–5.3)
Sodium: 137 mEq/L (ref 135–145)

## 2012-04-17 LAB — ALT: ALT: 12 U/L (ref 0–35)

## 2012-04-17 MED ORDER — HYDROCHLOROTHIAZIDE 12.5 MG PO CAPS: 12.5000 mg | ORAL_CAPSULE | Freq: Every day | ORAL | Status: DC

## 2012-04-17 MED ORDER — PRAVASTATIN SODIUM 40 MG PO TABS: ORAL_TABLET | ORAL | Status: DC

## 2012-04-17 MED ORDER — LISINOPRIL 10 MG PO TABS: 10.0000 mg | ORAL_TABLET | Freq: Every day | ORAL | Status: DC

## 2012-04-17 NOTE — Patient Instructions (Addendum)
For better health long term, I want you to work on smoking cessation. Even though you feel good now, we know that continued tobacco use will lead to coronary artery ("heart") disease which means heart attacks; also this increases your risk of strokes. Just consider what having a heart attack or stroke will do to your quality of life and how it will impact your loved ones. This is not just about you!

## 2012-04-17 NOTE — Progress Notes (Signed)
S: This 67 y.o. AA female has HTN, lipid disorder and is a chronic tobacco user. "I feel good"; pt denies fatigue; diaphoresis, abnormal weight change, CP or tightness, palpitations, edema, SOB or DOE, GI problems, myalgias, muscle cramps or leg pain, HA, dizziness, weakness, numbness or syncope. She has reduced tobacco use but finds it difficult to quit. She is compliant w/ medications and reports no side effects; there is a discrepancy between her medication dose for Lisinopril and what is in the EMR.  Pt has dental problems and plans to seek care at [facility]; she inquires about her overall state of health and if there are any reasons she may not be able to undergo procedures. Planned procedures include full upper extraction and 1-2 teeth in mandible for fitting for partial plate.  ROS: As per HPI.  O:  Filed Vitals:   04/17/12 1309  BP: 132/69  Pulse: 93  Temp: 98.5 F (36.9 C)  Resp: 16   GEN: In NAD; WN,WD. Faint smell of tobacco. HENT: [place]/AT; EOMI w/ clear conj and muddy sclerae. Dentition-missing teeth. Oroph clear and moist. COR: RRR. No m/g/r. LUNGS: CTA; no wheezes or rhonchi. Normal resp rate and effort. SKIN: W&D; no rashes or lesions. NEURO: A&O x 3; CNs intact. Nonfocal.  Results for orders placed in visit on 04/17/12  POCT GLYCOSYLATED HEMOGLOBIN (HGB A1C)      Result Value Range   Hemoglobin A1C 6.7      A/P: Type II or unspecified type diabetes mellitus without mention of complication, not stated as uncontrolled - Stable; no medication change at this time.  Plan: POCT glycosylated hemoglobin (Hb A1C), Ambulatory referral to Podiatry, Basic metabolic panel, ALT  Other and unspecified hyperlipidemia - continue statin at current dose.    Plan: Basic metabolic panel, LDL Cholesterol, Direct, ALT  HTN, goal below 140/80 - stable w/o medication side effects. Plan: Basic metabolic panel, ALT  Issue of repeat prescriptions   Meds ordered this encounter   Medications  . lisinopril (PRINIVIL,ZESTRIL) 10 MG tablet    Sig: Take 1 tablet (10 mg total) by mouth daily.    Dispense:  90 tablet    Refill:  3  . hydrochlorothiazide (MICROZIDE) 12.5 MG capsule    Sig: Take 1 capsule (12.5 mg total) by mouth daily.    Dispense:  90 capsule    Refill:  3  . pravastatin (PRAVACHOL) 40 MG tablet    Sig: Take 1 tablet by mouth every PM after a meal.    Dispense:  90 tablet    Refill:  3    Encouraged smoking cessation; advised about consequences of continued tobacco use (Cardiovascular disease and risk of stroke, to mention a few). Pt will work on cessation before next visit.

## 2012-04-18 NOTE — Progress Notes (Signed)
Quick Note:  Please contact pt and advise that the following labs are abnormal...  Blood sugar is elevated as expected. Remainder of labs are normal.  Copy to pt. ______

## 2012-06-23 ENCOUNTER — Telehealth: Payer: Self-pay

## 2012-06-23 NOTE — Telephone Encounter (Signed)
Patient states she needs a prescription for diabetic syringes to be faxed over to the mail order company that she uses. Patient did not give fax number nor the name of the pharmacy. [phone]

## 2012-06-24 MED ORDER — NEEDLES & SYRINGES MISC: Status: DC

## 2012-06-24 NOTE — Telephone Encounter (Signed)
done

## 2012-08-21 ENCOUNTER — Encounter: Payer: Medicare Other | Admitting: Family Medicine

## 2012-08-26 ENCOUNTER — Encounter: Payer: Self-pay | Admitting: Family Medicine

## 2012-08-26 ENCOUNTER — Ambulatory Visit (INDEPENDENT_AMBULATORY_CARE_PROVIDER_SITE_OTHER): Payer: Medicare Other | Admitting: Family Medicine

## 2012-08-26 VITALS — BP 130/64 | HR 118 | Temp 99.1°F | Resp 20 | Ht 66.5 in | Wt 156.2 lb

## 2012-08-26 DIAGNOSIS — IMO0001 Reserved for inherently not codable concepts without codable children: Secondary | ICD-10-CM | POA: Diagnosis not present

## 2012-08-26 DIAGNOSIS — F17200 Nicotine dependence, unspecified, uncomplicated: Secondary | ICD-10-CM

## 2012-08-26 DIAGNOSIS — I10 Essential (primary) hypertension: Secondary | ICD-10-CM | POA: Diagnosis not present

## 2012-08-26 DIAGNOSIS — Z720 Tobacco use: Secondary | ICD-10-CM

## 2012-08-26 MED ORDER — GLIMEPIRIDE 2 MG PO TABS: 2.0000 mg | ORAL_TABLET | Freq: Two times a day (BID) | ORAL | Status: DC

## 2012-08-26 NOTE — Progress Notes (Signed)
S:  This 67 y.o. AA female has Type II DM, well controlled w/o hypoglycemia. FSBS 2x daily= 100-120 (no values over 150). Pt feels good and is staying active. HTN is well controlled and pt is asymptomatic. She is compliant with all medications and denies diaphoresis, fatigue, abnormal weight change, vision disturbance, CP or tightness, palpitations, edema, SOB or DOE, cough, myalgias, HA, dizziness, numbness, weakness or syncope.  Pt continue to smoke cigarettes; she has managed to reduce quantity to 1 ppd. She is not inclined to quit despite knowing the consequences of ongoing tobacco use. She does have a slightly productive cough; pt has not experienced hemoptysis or pleuritic pain. She has no symptoms suggestive of vascular compromise in extremities.  Patient Active Problem List   Diagnosis Date Noted  . Other and unspecified hyperlipidemia 04/17/2012  . Type II or unspecified type diabetes mellitus without mention of complication, uncontrolled 09/13/2011  . HTN (hypertension) 09/13/2011  . Tobacco user 09/13/2011  . DDD (degenerative disc disease), lumbar 09/13/2011  . History of rectal polypectomy 08/31/2010   PMHx, Soc Hx and Fam Hx reviewed. Medications reconciled. IMM and vision care current. ROS: As per HPI; otherwise noncontributory.  O:  Filed Vitals:   08/26/12 1330  BP: 130/64  Pulse: 118  Temp: 99.1 F (37.3 C)  Resp: 20   GEN: In NAD; WN,WD. Pt smells of cigarettes. HENT: [place]/AT; EOMI w/ clear conj and muddy sclerae. EACs/ nose/ oroph clear and moist. COR: RRR. No edema. LUNGS: Normal resp rate and effort. SKIN: W&D; no rashes or erythema. MS: MAEs; no cyanosis or joint deformities/ effusions. NEURO: A&Ox 3; CNs intact. Nonfocal.  Results for orders placed in visit on 08/26/12  POCT GLYCOSYLATED HEMOGLOBIN (HGB A1C)      Result Value Range   Hemoglobin A1C 6.6       A/P: Type II or unspecified type diabetes mellitus without mention of complication,  uncontrolled - Stable; continue current medications w/o changes.    Plan: POCT glycosylated hemoglobin (Hb A1C)  Tobacco user- Encouraged reduction. Tips for success given to pt.  HTN (hypertension)- Stable and controlled; no medication change at this time.  Meds ordered this encounter  Medications  . glimepiride (AMARYL) 2 MG tablet    Sig: Take 1 tablet (2 mg total) by mouth 2 (two) times daily.    Dispense:  180 tablet    Refill:  1

## 2012-08-26 NOTE — Patient Instructions (Signed)

## 2012-09-05 ENCOUNTER — Other Ambulatory Visit: Payer: Self-pay | Admitting: Family Medicine

## 2012-09-30 DIAGNOSIS — Z23 Encounter for immunization: Secondary | ICD-10-CM | POA: Diagnosis not present

## 2012-11-20 ENCOUNTER — Other Ambulatory Visit: Payer: Self-pay

## 2012-12-31 ENCOUNTER — Ambulatory Visit (INDEPENDENT_AMBULATORY_CARE_PROVIDER_SITE_OTHER): Payer: Medicare Other | Admitting: Family Medicine

## 2012-12-31 ENCOUNTER — Encounter: Payer: Self-pay | Admitting: Family Medicine

## 2012-12-31 VITALS — BP 110/70 | HR 109 | Temp 98.1°F | Resp 16 | Ht 66.5 in | Wt 157.8 lb

## 2012-12-31 DIAGNOSIS — F17200 Nicotine dependence, unspecified, uncomplicated: Secondary | ICD-10-CM

## 2012-12-31 DIAGNOSIS — I10 Essential (primary) hypertension: Secondary | ICD-10-CM

## 2012-12-31 DIAGNOSIS — IMO0001 Reserved for inherently not codable concepts without codable children: Secondary | ICD-10-CM

## 2012-12-31 DIAGNOSIS — Z0000 Encounter for general adult medical examination without abnormal findings: Secondary | ICD-10-CM | POA: Diagnosis not present

## 2012-12-31 DIAGNOSIS — Z720 Tobacco use: Secondary | ICD-10-CM

## 2012-12-31 LAB — POCT URINALYSIS DIPSTICK
Glucose, UA: NEGATIVE
Leukocytes, UA: NEGATIVE
Nitrite, UA: NEGATIVE
Protein, UA: NEGATIVE
Spec Grav, UA: 1.02
Urobilinogen, UA: 0.2

## 2012-12-31 LAB — POCT GLYCOSYLATED HEMOGLOBIN (HGB A1C): Hemoglobin A1C: 7.1

## 2012-12-31 LAB — COMPLETE METABOLIC PANEL WITH GFR
ALT: 9 U/L (ref 0–35)
Albumin: 4 g/dL (ref 3.5–5.2)
CO2: 23 mEq/L (ref 19–32)
Calcium: 9.5 mg/dL (ref 8.4–10.5)
Chloride: 105 mEq/L (ref 96–112)
GFR, Est African American: 78 mL/min
GFR, Est Non African American: 68 mL/min
Glucose, Bld: 121 mg/dL — ABNORMAL HIGH (ref 70–99)
Potassium: 4.6 mEq/L (ref 3.5–5.3)
Sodium: 140 mEq/L (ref 135–145)
Total Protein: 7 g/dL (ref 6.0–8.3)

## 2012-12-31 LAB — TSH: TSH: 1.614 u[IU]/mL (ref 0.350–4.500)

## 2012-12-31 LAB — T3, FREE: T3, Free: 3.7 pg/mL (ref 2.3–4.2)

## 2012-12-31 LAB — CBC WITH DIFFERENTIAL/PLATELET
Basophils Absolute: 0 10*3/uL (ref 0.0–0.1)
Basophils Relative: 0 % (ref 0–1)
Eosinophils Absolute: 0.1 10*3/uL (ref 0.0–0.7)
Hemoglobin: 14.8 g/dL (ref 12.0–15.0)
MCH: 28.7 pg (ref 26.0–34.0)
MCHC: 34.6 g/dL (ref 30.0–36.0)
Monocytes Absolute: 0.7 10*3/uL (ref 0.1–1.0)
Monocytes Relative: 8 % (ref 3–12)
Neutro Abs: 4.1 10*3/uL (ref 1.7–7.7)
Neutrophils Relative %: 50 % (ref 43–77)
RDW: 13.9 % (ref 11.5–15.5)

## 2012-12-31 LAB — LIPID PANEL
HDL: 43 mg/dL (ref >39)
LDL Cholesterol: 74 mg/dL (ref 0–99)

## 2012-12-31 NOTE — Progress Notes (Signed)
Subjective:    Patient ID: [name], female    DOB: 09-20-1945, 67 y.o.   MRN: [id number]  HPI  This 67 y.o. AA female is here for [facility] subsequent annual CPE. She has controlled DM, HTN and is a heavy smoker. She is compliant w/ medications w/o adverse effects.  FSBS= ~80-120. Pt states she plans to quit smoking in 2015.  HCM: Imm- Current except Tdap.           MMG- Current.           VISION- Annually in March.           CRS- Every 3-5 years  (Dr. Elnoria Howard) w/ family hx of colon cancer.   PMHx, Surg Hx, Soc and Fam Hx reviewed.   Review of Systems  Constitutional: Positive for fatigue.  Respiratory: Positive for cough and shortness of breath.   All other systems reviewed and are negative.      Objective:   Physical Exam  Nursing note and vitals reviewed. Constitutional: She is oriented to person, place, and time. Vital signs are normal. She appears well-developed and well-nourished. No distress.  HENT:  Head: Normocephalic and atraumatic.  Right Ear: Hearing, tympanic membrane, external ear and ear canal normal.  Left Ear: Hearing, tympanic membrane, external ear and ear canal normal.  Nose: Nose normal. No mucosal edema, rhinorrhea, nasal deformity or septal deviation.  Mouth/Throat: Uvula is midline, oropharynx is clear and moist and mucous membranes are normal. Abnormal dentition.  Eyes: Conjunctivae, EOM and lids are normal. Pupils are equal, round, and reactive to light. No scleral icterus.  Muddy sclerae noted; wears corrective lenses.  Neck: Normal range of motion and full passive range of motion without pain. Neck supple. No JVD present. No spinous process tenderness and no muscular tenderness present. Carotid bruit is not present. No mass and no thyromegaly present.  Cardiovascular: Normal rate, regular rhythm, S1 normal, S2 normal and normal heart sounds.   No extrasystoles are present. PMI is not displaced.  Exam reveals no gallop and no friction rub.   No murmur  heard. Pulses:      Carotid pulses are 2+ on the right side, and 2+ on the left side.      Radial pulses are 2+ on the right side, and 2+ on the left side.       Femoral pulses are 2+ on the right side, and 2+ on the left side.      Popliteal pulses are 1+ on the right side, and 1+ on the left side.       Dorsalis pedis pulses are 1+ on the right side, and 1+ on the left side.       Posterior tibial pulses are 1+ on the right side, and 1+ on the left side.  Pulmonary/Chest: Effort normal and breath sounds normal. No respiratory distress. She has no wheezes. She has no rhonchi. She has no rales. Right breast exhibits no inverted nipple, no mass, no nipple discharge, no skin change and no tenderness. Left breast exhibits no inverted nipple, no mass, no nipple discharge, no skin change and no tenderness. Breasts are symmetrical.  BS diminished at bases.  Abdominal: Soft. Normal appearance and bowel sounds are normal. She exhibits no distension, no abdominal bruit, no pulsatile midline mass and no mass. There is no hepatosplenomegaly. There is tenderness in the right upper quadrant and epigastric area. There is no rebound, no guarding and no CVA tenderness.  Genitourinary:  Deferred.  Musculoskeletal:  Normal range of motion. She exhibits no edema and no tenderness.       Right hip: Normal.       Left hip: Normal.       Cervical back: Normal.       Thoracic back: Normal.       Lumbar back: Normal.  Lymphadenopathy:       Head (right side): No submental, no submandibular, no tonsillar, no posterior auricular and no occipital adenopathy present.       Head (left side): No submental, no submandibular, no tonsillar, no posterior auricular and no occipital adenopathy present.    She has no cervical adenopathy.    She has no axillary adenopathy.       Right: No inguinal and no supraclavicular adenopathy present.       Left: No inguinal and no supraclavicular adenopathy present.  Neurological: She is  alert and oriented to person, place, and time. She has normal strength. She displays no atrophy and no tremor. No cranial nerve deficit or sensory deficit. She exhibits normal muscle tone. Coordination and gait normal.  Reflex Scores:      Tricep reflexes are 0 on the right side and 0 on the left side.      Bicep reflexes are 0 on the right side and 1+ on the left side.      Patellar reflexes are 1+ on the right side and 1+ on the left side. Skin: Skin is warm, dry and intact. No ecchymosis, no lesion and no rash noted. She is not diaphoretic. No cyanosis or erythema. Nails show clubbing.  Psychiatric: She has a normal mood and affect. Her speech is normal and behavior is normal. Judgment and thought content normal. Cognition and memory are normal.    Results for orders placed in visit on 12/31/12  POCT URINALYSIS DIPSTICK      Result Value Range   Color, UA yellow     Clarity, UA clear     Glucose, UA neg     Bilirubin, UA small     Ketones, UA trace     Spec Grav, UA 1.020     Blood, UA neg     pH, UA 5.0     Protein, UA neg     Urobilinogen, UA 0.2     Nitrite, UA neg     Leukocytes, UA Negative    POCT GLYCOSYLATED HEMOGLOBIN (HGB A1C)      Result Value Range   Hemoglobin A1C 7.1        Assessment & Plan:  Routine general medical examination at a health care facility - Plan: POCT urinalysis dipstick, TSH, T3, free, Lipid panel, COMPLETE METABOLIC PANEL WITH GFR, CBC with Differential  Essential hypertension, benign - Stable and controlled on current medications. Plan: TSH, T3, free, Lipid panel, COMPLETE METABOLIC PANEL WITH GFR, CBC with Differential  Type II or unspecified type diabetes mellitus without mention of complication, uncontrolled - Stable; continue current medications. Pt reports mild hypoglycemia so current A1c is acceptable.  Plan: POCT glycosylated hemoglobin (Hb A1C), HM Diabetes Foot Exam  Tobacco user- Strongly encouraged cessation and given printed "Tips  for Success".

## 2012-12-31 NOTE — Progress Notes (Signed)
   Subjective:    Patient ID: [name], female    DOB: 1945/09/23, 67 y.o.   MRN: [id number]  HPI    Review of Systems  Constitutional: Positive for fatigue.  HENT: Negative.   Eyes: Negative.   Respiratory: Positive for cough and shortness of breath.   Cardiovascular: Negative.   Gastrointestinal: Negative.   Endocrine: Negative.   Genitourinary: Negative.   Musculoskeletal: Negative.   Skin: Negative.   Allergic/Immunologic: Negative.   Neurological: Negative.   Hematological: Negative.   Psychiatric/Behavioral: Negative.        Objective:   Physical Exam        Assessment & Plan:

## 2012-12-31 NOTE — Patient Instructions (Addendum)
Keeping You Healthy  Get These Tests  Blood Pressure- Have your blood pressure checked by your healthcare provider at least once a year.  Normal blood pressure is 120/80.  Weight- Have your body mass index (BMI) calculated to screen for obesity.  BMI is a measure of body fat based on height and weight.  You can calculate your own BMI at [email or web address].com/bmi/  Cholesterol- Have your cholesterol checked every year.  Diabetes- Have your blood sugar checked every year if you have high blood pressure, high cholesterol, a family history of diabetes or if you are overweight.  Pap Smear- Have a pap smear every 1 to 3 years if you have been sexually active.  If you are older than 65 and recent pap smears have been normal you may not need additional pap smears.  In addition, if you have had a hysterectomy  For benign disease additional pap smears are not necessary.  Mammogram-Yearly mammograms are essential for early detection of breast cancer  Screening for Colon Cancer- Colonoscopy starting at age 50. Screening may begin sooner depending on your family history and other health conditions.  Follow up colonoscopy as directed by your Gastroenterologist.  Screening for Osteoporosis- Screening begins at age 65 with bone density scanning, sooner if you are at higher risk for developing Osteoporosis.  Get these medicines  Calcium with Vitamin D- Your body requires 1200-1500 mg of Calcium a day and 800-1000 IU of Vitamin D a day.  You can only absorb 500 mg of Calcium at a time therefore Calcium must be taken in 2 or 3 separate doses throughout the day.  Hormones- Hormone therapy has been associated with increased risk for certain cancers and heart disease.  Talk to your healthcare provider about if you need relief from menopausal symptoms.  Aspirin- Ask your healthcare provider about taking Aspirin to prevent Heart Disease and Stroke.  Get these Immuniztions  Flu shot- Every fall  Pneumonia  shot- Once after the age of 65; if you are younger ask your healthcare provider if you need a pneumonia shot.  Tetanus- Every ten years.  Zostavax- Once after the age of 60 to prevent shingles.  Take these steps  Don't smoke- Your healthcare provider can help you quit. For tips on how to quit, ask your healthcare provider or go to www.smokefree.gov or call 1-800 QUIT-NOW.  Be physically active- Exercise 5 days a week for a minimum of 30 minutes.  If you are not already physically active, start slow and gradually work up to 30 minutes of moderate physical activity.  Try walking, dancing, bike riding, swimming, etc.  Eat a healthy diet- Eat a variety of healthy foods such as fruits, vegetables, whole grains, low fat milk, low fat cheeses, yogurt, lean meats, chicken, fish, eggs, dried beans, tofu, etc.  For more information go to www.thenutritionsource.org  Dental visit- Brush and floss teeth twice daily; visit your dentist twice a year.  Eye exam- Visit your Optometrist or Ophthalmologist yearly.  Drink alcohol in moderation- Limit alcohol intake to one drink or less a day.  Never drink and drive.  Depression- Your emotional health is as important as your physical health.  If you're feeling down or losing interest in things you normally enjoy, please talk to your healthcare provider.  Seat Belts- can save your life; always wear one  Smoke/Carbon Monoxide detectors- These detectors need to be installed on the appropriate level of your home.  Replace batteries at least once a year.  Violence- If anyone   is threatening or hurting you, please tell your healthcare provider.  Living Will/ Health care power of attorney- Discuss with your healthcare provider and family.    Diabetes- A1c= 7.!%; I am satisfied with this value. I do not want you to have blood sugars that are dropping too low. Try to cut back on starches (bread, rice, pasta and potatoes) as well as concentrated sweets such as  cookies, cakes, chips and other sources of sugar. Drink plenty of water!!!   Smoking Cessation, Tips for Success YOU CAN QUIT SMOKING If you are ready to quit smoking, congratulations! You have chosen to help yourself be healthier. Cigarettes bring nicotine, tar, carbon monoxide, and other irritants into your body. Your lungs, heart, and blood vessels will be able to work better without these poisons. There are many different ways to quit smoking. Nicotine gum, nicotine patches, a nicotine inhaler, or nicotine nasal spray can help with physical craving. Hypnosis, support groups, and medicines help break the habit of smoking. Here are some tips to help you quit for good.  Throw away all cigarettes.  Clean and remove all ashtrays from your home, work, and car.  On a card, write down your reasons for quitting. Carry the card with you and read it when you get the urge to smoke.  Cleanse your body of nicotine. Drink enough water and fluids to keep your urine clear or pale yellow. Do this after quitting to flush the nicotine from your body.  Learn to predict your moods. Do not let a bad situation be your excuse to have a cigarette. Some situations in your life might tempt you into wanting a cigarette.  Never have "just one" cigarette. It leads to wanting another and another. Remind yourself of your decision to quit.  Change habits associated with smoking. If you smoked while driving or when feeling stressed, try other activities to replace smoking. Stand up when drinking your coffee. Brush your teeth after eating. Sit in a different chair when you read the paper. Avoid alcohol while trying to quit, and try to drink fewer caffeinated beverages. Alcohol and caffeine may urge you to smoke.  Avoid foods and drinks that can trigger a desire to smoke, such as sugary or spicy foods and alcohol.  Ask people who smoke not to smoke around you.  Have something planned to do right after eating or having a cup  of coffee. Take a walk or exercise to perk you up. This will help to keep you from overeating.  Try a relaxation exercise to calm you down and decrease your stress. Remember, you may be tense and nervous for the first 2 weeks after you quit, but this will pass.  Find new activities to keep your hands busy. Play with a pen, coin, or rubber band. Doodle or draw things on paper.  Brush your teeth right after eating. This will help cut down on the craving for the taste of tobacco after meals. You can try mouthwash, too.  Use oral substitutes, such as lemon drops, carrots, a cinnamon stick, or chewing gum, in place of cigarettes. Keep them handy so they are available when you have the urge to smoke.  When you have the urge to smoke, try deep breathing.  Designate your home as a nonsmoking area.  If you are a heavy smoker, ask your caregiver about a prescription for nicotine chewing gum. It can ease your withdrawal from nicotine.  Reward yourself. Set aside the cigarette money you save and buy  yourself something nice.  Look for support from others. Join a support group or smoking cessation program. Ask someone at home or at work to help you with your plan to quit smoking.  Always ask yourself, "Do I need this cigarette or is this just a reflex?" Tell yourself, "Today, I choose not to smoke," or "I do not want to smoke." You are reminding yourself of your decision to quit, even if you do smoke a cigarette. HOW WILL I FEEL WHEN I QUIT SMOKING?  The benefits of not smoking start within days of quitting.  You may have symptoms of withdrawal because your body is used to nicotine (the addictive substance in cigarettes). You may crave cigarettes, be irritable, feel very hungry, cough often, get headaches, or have difficulty concentrating.  The withdrawal symptoms are only temporary. They are strongest when you first quit but will go away within 10 to 14 days.  When withdrawal symptoms occur, stay in  control. Think about your reasons for quitting. Remind yourself that these are signs that your body is healing and getting used to being without cigarettes.  Remember that withdrawal symptoms are easier to treat than the major diseases that smoking can cause.  Even after the withdrawal is over, expect periodic urges to smoke. However, these cravings are generally short-lived and will go away whether you smoke or not. Do not smoke!  If you relapse and smoke again, do not lose hope. Most smokers quit 3 times before they are successful.  If you relapse, do not give up! Plan ahead and think about what you will do the next time you get the urge to smoke. LIFE AS A NONSMOKER: MAKE IT FOR A MONTH, MAKE IT FOR LIFE Day 1: Hang this page where you will see it every day. Day 2: Get rid of all ashtrays, matches, and lighters. Day 3: Drink water. Breathe deeply between sips. Day 4: Avoid places with smoke-filled air, such as bars, clubs, or the smoking section of restaurants. Day 5: Keep track of how much money you save by not smoking. Day 6: Avoid boredom. Keep a good book with you or go to the movies. Day 7: Reward yourself! One week without smoking! Day 8: Make a dental appointment to get your teeth cleaned. Day 9: Decide how you will turn down a cigarette before it is offered to you. Day 10: Review your reasons for quitting. Day 11: Distract yourself. Stay active to keep your mind off smoking and to relieve tension. Take a walk, exercise, read a book, do a crossword puzzle, or try a new hobby. Day 12: Exercise. Get off the bus before your stop or use stairs instead of escalators. Day 13: Call on friends for support and encouragement. Day 14: Reward yourself! Two weeks without smoking! Day 15: Practice deep breathing exercises. Day 16: Bet a friend that you can stay a nonsmoker. Day 17: Ask to sit in nonsmoking sections of restaurants. Day 18: Hang up "No Smoking" signs. Day 19: Think of yourself  as a nonsmoker. Day 20: Each morning, tell yourself you will not smoke. Day 21: Reward yourself! Three weeks without smoking! Day 22: Think of smoking in negative ways. Remember how it stains your teeth, gives you bad breath, and leaves you short of breath. Day 23: Eat a nutritious breakfast. Day 24:Do not relive your days as a smoker. Day 25: Hold a pencil in your hand when talking on the telephone. Day 26: Tell all your friends you do not  smoke. Day 27: Think about how much better food tastes. Day 28: Remember, one cigarette is one too many. Day 29: Take up a hobby that will keep your hands busy. Day 30: Congratulations! One month without smoking! Give yourself a big reward. Your caregiver can direct you to community resources or hospitals for support, which may include:  Group support.  Education.  Hypnosis.  Subliminal therapy. Document Released: 09/30/2003 Document Revised: 03/26/2011 Document Reviewed: 06/19/2012 [facility] Patient Information 2014 [place], [place].

## 2013-01-04 ENCOUNTER — Encounter: Payer: Self-pay | Admitting: Family Medicine

## 2013-02-01 ENCOUNTER — Other Ambulatory Visit: Payer: Self-pay | Admitting: Family Medicine

## 2013-02-10 ENCOUNTER — Telehealth: Payer: Self-pay

## 2013-02-10 MED ORDER — PRAVASTATIN SODIUM 40 MG PO TABS: ORAL_TABLET | ORAL | Status: DC

## 2013-02-10 MED ORDER — INSULIN DETEMIR 100 UNIT/ML ~~LOC~~ SOLN: 20.0000 [IU] | Freq: Every day | SUBCUTANEOUS | Status: DC

## 2013-02-10 MED ORDER — GLIMEPIRIDE 2 MG PO TABS: ORAL_TABLET | ORAL | Status: DC

## 2013-02-10 MED ORDER — NEEDLES & SYRINGES MISC: Status: DC

## 2013-02-10 MED ORDER — HYDROCHLOROTHIAZIDE 12.5 MG PO CAPS: 12.5000 mg | ORAL_CAPSULE | Freq: Every day | ORAL | Status: DC

## 2013-02-10 MED ORDER — METFORMIN HCL 1000 MG PO TABS: ORAL_TABLET | ORAL | Status: DC

## 2013-02-10 MED ORDER — LISINOPRIL 10 MG PO TABS: 10.0000 mg | ORAL_TABLET | Freq: Every day | ORAL | Status: DC

## 2013-02-10 NOTE — Telephone Encounter (Signed)
Dr Leward Quan can we refill her medications?

## 2013-02-10 NOTE — Telephone Encounter (Signed)
Patient states that she needs refills on ALL of her meds including her syringes. [facility]  [phone]

## 2013-02-10 NOTE — Telephone Encounter (Signed)
All medications and DM supplies have been e-prescribed to [facility]. Everything is refilled for 1 year.

## 2013-03-17 ENCOUNTER — Other Ambulatory Visit: Payer: Self-pay | Admitting: Family Medicine

## 2013-03-17 DIAGNOSIS — Z1231 Encounter for screening mammogram for malignant neoplasm of breast: Secondary | ICD-10-CM

## 2013-04-07 LAB — HM DIABETES EYE EXAM

## 2013-04-08 ENCOUNTER — Encounter: Payer: Self-pay | Admitting: *Deleted

## 2013-04-08 ENCOUNTER — Ambulatory Visit (HOSPITAL_COMMUNITY)
Admission: RE | Admit: 2013-04-08 | Discharge: 2013-04-08 | Disposition: A | Discharge status: Home / Self Care | Payer: Medicare Other | Source: Ambulatory Visit | Attending: Family Medicine | Admitting: Family Medicine

## 2013-04-08 DIAGNOSIS — Z1231 Encounter for screening mammogram for malignant neoplasm of breast: Secondary | ICD-10-CM | POA: Diagnosis not present

## 2013-04-08 DIAGNOSIS — H2513 Age-related nuclear cataract, bilateral: Secondary | ICD-10-CM

## 2013-04-10 ENCOUNTER — Other Ambulatory Visit: Payer: Self-pay | Admitting: Radiology

## 2013-04-10 NOTE — Telephone Encounter (Signed)
Pt wants diabetic supplies to somewhere called [facility] she does not know what type of meter she has, a new one is being sent. She tests bid. She indicates the phone number for me to call is [phone]

## 2013-04-17 NOTE — Telephone Encounter (Signed)
I have called and they are supposed to send a request for supplies

## 2013-04-23 ENCOUNTER — Other Ambulatory Visit: Payer: Self-pay

## 2013-06-02 ENCOUNTER — Encounter: Payer: Self-pay | Admitting: Family Medicine

## 2013-06-02 ENCOUNTER — Ambulatory Visit (INDEPENDENT_AMBULATORY_CARE_PROVIDER_SITE_OTHER): Payer: Medicare Other | Admitting: Family Medicine

## 2013-06-02 VITALS — BP 124/74 | HR 103 | Temp 98.9°F | Resp 16 | Ht 67.0 in | Wt 158.0 lb

## 2013-06-02 DIAGNOSIS — Z639 Problem related to primary support group, unspecified: Secondary | ICD-10-CM | POA: Diagnosis not present

## 2013-06-02 DIAGNOSIS — IMO0001 Reserved for inherently not codable concepts without codable children: Secondary | ICD-10-CM | POA: Diagnosis not present

## 2013-06-02 DIAGNOSIS — Z720 Tobacco use: Secondary | ICD-10-CM

## 2013-06-02 DIAGNOSIS — F439 Reaction to severe stress, unspecified: Secondary | ICD-10-CM

## 2013-06-02 DIAGNOSIS — F17200 Nicotine dependence, unspecified, uncomplicated: Secondary | ICD-10-CM

## 2013-06-02 DIAGNOSIS — E1165 Type 2 diabetes mellitus with hyperglycemia: Principal | ICD-10-CM

## 2013-06-02 LAB — POCT GLYCOSYLATED HEMOGLOBIN (HGB A1C): Hemoglobin A1C: 7.5
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
## 2013-06-02 NOTE — Patient Instructions (Signed)
I suggest you contact [facility] to find out if you qualify for subsidized housing for senior citizens.

## 2013-09-08 ENCOUNTER — Ambulatory Visit: Payer: Medicare Other

## 2013-09-23 DIAGNOSIS — Z23 Encounter for immunization: Secondary | ICD-10-CM | POA: Diagnosis not present

## 2013-10-06 ENCOUNTER — Ambulatory Visit (INDEPENDENT_AMBULATORY_CARE_PROVIDER_SITE_OTHER): Payer: Medicare Other | Admitting: Family Medicine

## 2013-10-06 ENCOUNTER — Encounter: Payer: Self-pay | Admitting: Family Medicine

## 2013-10-06 VITALS — BP 116/64 | HR 80 | Temp 99.3°F | Resp 16 | Ht 67.0 in | Wt 157.8 lb

## 2013-10-06 DIAGNOSIS — IMO0001 Reserved for inherently not codable concepts without codable children: Secondary | ICD-10-CM

## 2013-10-06 DIAGNOSIS — E1165 Type 2 diabetes mellitus with hyperglycemia: Principal | ICD-10-CM

## 2013-10-06 LAB — POCT GLYCOSYLATED HEMOGLOBIN (HGB A1C): Hemoglobin A1C: 8.4

## 2013-10-06 NOTE — Progress Notes (Signed)
S:  This 68 y.o. AA female has Type II DM; she uses Insulin and oral medications. Nutrition is adequate but pt is not exercising. She continues to smoke. FSBS are in mid 100s. She denies hypoglycemia, diaphoresis, vision disturbances, fatigue, palpitations, HA, excessive thirst or polyuria, dizziness, weakness or syncope. Pt checks FSBS bid.  Patient Active Problem List   Diagnosis Date Noted  . Nuclear sclerotic cataract of both eyes 04/08/2013  . Other and unspecified hyperlipidemia 04/17/2012  . Type II or unspecified type diabetes mellitus without mention of complication, uncontrolled 09/13/2011  . HTN (hypertension) 09/13/2011  . Tobacco user 09/13/2011  . DDD (degenerative disc disease), lumbar 09/13/2011  . History of rectal polypectomy 08/31/2010     Prior to Admission medications   Medication Sig Start Date End Date Taking? Authorizing Provider  aspirin 81 MG tablet Take 81 mg by mouth daily.     Yes Historical Provider, MD  Cholecalciferol (VITAMIN D3) 1000 UNITS CAPS Take 2 tablets by mouth daily.     Yes Historical Provider, MD  glimepiride (AMARYL) 2 MG tablet Take 1/2 tablet twice daily with meals. 02/10/13  Yes Barton Fanny, MD  hydrochlorothiazide (MICROZIDE) 12.5 MG capsule Take 1 capsule (12.5 mg total) by mouth daily. 02/10/13  Yes Barton Fanny, MD  insulin detemir (LEVEMIR) 100 UNIT/ML injection Inject 0.2 mLs (20 Units total) into the skin at bedtime. 02/10/13  Yes Barton Fanny, MD  lisinopril (PRINIVIL,ZESTRIL) 10 MG tablet Take 1 tablet (10 mg total) by mouth daily. 02/10/13  Yes Barton Fanny, MD  metFORMIN (GLUCOPHAGE) 1000 MG tablet Take 1 tablet by mouth  twice a day with a meal 02/10/13  Yes Barton Fanny, MD  Needles & Syringes MISC Insulin needles and syringes 02/10/13  Yes Barton Fanny, MD  pravastatin (PRAVACHOL) 40 MG tablet Take 1 tablet by mouth every PM after a meal. 02/10/13  Yes Barton Fanny, MD    History    Social History  . Marital Status: Divorced    Spouse Name: N/A    Number of Children: N/A  . Years of Education: N/A   Occupational History  . Not on file.   Social History Main Topics  . Smoking status: Current Every Day Smoker -- 1.50 packs/day for 50 years    Types: Cigarettes  . Smokeless tobacco: Not on file  . Alcohol Use: No  . Drug Use: No  . Sexual Activity: Not on file   Other Topics Concern  . Not on file   Social History Narrative  . No narrative on file    ROS: As per HPI.  O: Filed Vitals:   10/06/13 1324  BP: 116/64  Pulse: 80  Temp: 99.3 F (37.4 C)  Resp: 16    GEN: In NAD: WN,WD. Weight is stable. HENT: [place]/AT: EOMI w/ clear conj/sclerae. Ext ears/ nose/ oroph unremarkable except missing teeth. COR: RRR. No edema. LUNGS: Unlabored resp. SKIN: W&D; intact w/o diaphoresis or erythema. NEURO: A&O x 3; CNs intact. Nonfocal.  Results for orders placed in visit on 10/06/13  POCT GLYCOSYLATED HEMOGLOBIN (HGB A1C)      Result Value Ref Range   Hemoglobin A1C 8.4      A/P: Type II or unspecified type diabetes mellitus without mention of complication, uncontrolled - Medication change- increase Glimepiride 2 mg to 1 tablet before breakfast and continue 1/2 tablet before evening meal. Pt reports that she had hypoglycemia in the past when she took Glimepiride 2  mg 1 tablet twice daily. She will increase physical activity. Pt will call back if she needs new medication prescription for this medication. Encouraged smoking cessation.  Plan: HM Diabetes Foot Exam, POCT glycosylated hemoglobin (Hb A1C)

## 2013-10-06 NOTE — Patient Instructions (Addendum)
Diabetes Mellitus and Food It is important for you to manage your blood sugar (glucose) level. Your blood glucose level can be greatly affected by what you eat. Eating healthier foods in the appropriate amounts throughout the day at about the same time each day will help you control your blood glucose level. It can also help slow or prevent worsening of your diabetes mellitus. Healthy eating may even help you improve the level of your blood pressure and reach or maintain a healthy weight.  HOW CAN FOOD AFFECT ME? Carbohydrates Carbohydrates affect your blood glucose level more than any other type of food. Your dietitian will help you determine how many carbohydrates to eat at each meal and teach you how to count carbohydrates. Counting carbohydrates is important to keep your blood glucose at a healthy level, especially if you are using insulin or taking certain medicines for diabetes mellitus. Alcohol Alcohol can cause sudden decreases in blood glucose (hypoglycemia), especially if you use insulin or take certain medicines for diabetes mellitus. Hypoglycemia can be a life-threatening condition. Symptoms of hypoglycemia (sleepiness, dizziness, and disorientation) are similar to symptoms of having too much alcohol.  If your health care provider has given you approval to drink alcohol, do so in moderation and use the following guidelines:  Women should not have more than one drink per day, and men should not have more than two drinks per day. One drink is equal to:  12 oz of beer.  5 oz of wine.  1 oz of hard liquor.  Do not drink on an empty stomach.  Keep yourself hydrated. Have water, diet soda, or unsweetened iced tea.  Regular soda, juice, and other mixers might contain a lot of carbohydrates and should be counted. WHAT FOODS ARE NOT RECOMMENDED? As you make food choices, it is important to remember that all foods are not the same. Some foods have fewer nutrients per serving than other  foods, even though they might have the same number of calories or carbohydrates. It is difficult to get your body what it needs when you eat foods with fewer nutrients. Examples of foods that you should avoid that are high in calories and carbohydrates but low in nutrients include:  Trans fats (most processed foods list trans fats on the Nutrition Facts label).  Regular soda.  Juice.  Candy.  Sweets, such as cake, pie, doughnuts, and cookies.  Fried foods. WHAT FOODS CAN I EAT? Have nutrient-rich foods, which will nourish your body and keep you healthy. The food you should eat also will depend on several factors, including:  The calories you need.  The medicines you take.  Your weight.  Your blood glucose level.  Your blood pressure level.  Your cholesterol level. You also should eat a variety of foods, including:  Protein, such as meat, poultry, fish, tofu, nuts, and seeds (lean animal proteins are best).  Fruits.  Vegetables.  Dairy products, such as milk, cheese, and yogurt (low fat is best).  Breads, grains, pasta, cereal, rice, and beans.  Fats such as olive oil, trans fat-free margarine, canola oil, avocado, and olives. DOES EVERYONE WITH DIABETES MELLITUS HAVE THE SAME MEAL PLAN? Because every person with diabetes mellitus is different, there is not one meal plan that works for everyone. It is very important that you meet with a dietitian who will help you create a meal plan that is just right for you. Document Released: 09/28/2004 Document Revised: 01/06/2013 Document Reviewed: 11/28/2012 [employer] Patient Information 2015 [employer]. This   information is not intended to replace advice given to you by your health care provider. Make sure you discuss any questions you have with your health care provider.    There is a lot of information about Diabetes meal planning available online.  Increase Glimepiride 2 mg to 1 tablet before breakfast and 1/2 tablet before  evening meal. Try to increase physical activity.

## 2013-11-05 ENCOUNTER — Encounter: Payer: Self-pay | Admitting: Family Medicine

## 2013-11-06 NOTE — Telephone Encounter (Signed)
Dr  Leward Quan, pls see note from pt re: HCTZ. I will be watching for PA req from pharmacy, but wanted you to see her question about whether she could DC it or start something cheaper.

## 2013-11-09 NOTE — Telephone Encounter (Signed)
I checked with [facility] and no PA is needed for HCTZ at this time. Something may change at beginning of year. Dr Leward Quan, see pt's question about whether she needs this med or if she can be on anything less expensive.

## 2014-01-05 ENCOUNTER — Encounter: Payer: Self-pay | Admitting: Family Medicine

## 2014-01-05 ENCOUNTER — Ambulatory Visit (INDEPENDENT_AMBULATORY_CARE_PROVIDER_SITE_OTHER): Payer: Medicare Other | Admitting: Family Medicine

## 2014-01-05 VITALS — BP 110/64 | HR 106 | Temp 98.6°F | Resp 16 | Ht 67.0 in | Wt 158.6 lb

## 2014-01-05 DIAGNOSIS — R000 Tachycardia, unspecified: Secondary | ICD-10-CM | POA: Diagnosis not present

## 2014-01-05 DIAGNOSIS — I10 Essential (primary) hypertension: Secondary | ICD-10-CM | POA: Diagnosis not present

## 2014-01-05 DIAGNOSIS — E1165 Type 2 diabetes mellitus with hyperglycemia: Secondary | ICD-10-CM

## 2014-01-05 DIAGNOSIS — IMO0002 Reserved for concepts with insufficient information to code with codable children: Secondary | ICD-10-CM

## 2014-01-05 LAB — COMPLETE METABOLIC PANEL WITH GFR
ALT: 13 U/L (ref 0–35)
AST: 14 U/L (ref 0–37)
Albumin: 4.2 g/dL (ref 3.5–5.2)
Alkaline Phosphatase: 96 U/L (ref 39–117)
BILIRUBIN TOTAL: 0.3 mg/dL (ref 0.2–1.2)
BUN: 17 mg/dL (ref 6–23)
CO2: 26 mEq/L (ref 19–32)
Calcium: 10 mg/dL (ref 8.4–10.5)
Chloride: 102 mEq/L (ref 96–112)
Creat: 1 mg/dL (ref 0.50–1.10)
GFR, Est African American: 66 mL/min
GFR, Est Non African American: 58 mL/min — ABNORMAL LOW
GLUCOSE: 123 mg/dL — AB (ref 70–99)
Potassium: 4.9 mEq/L (ref 3.5–5.3)
SODIUM: 137 meq/L (ref 135–145)
TOTAL PROTEIN: 7.2 g/dL (ref 6.0–8.3)

## 2014-01-05 LAB — POCT GLYCOSYLATED HEMOGLOBIN (HGB A1C): Hemoglobin A1C: 8.5

## 2014-01-05 MED ORDER — INSULIN DETEMIR 100 UNIT/ML ~~LOC~~ SOLN: 25.0000 [IU] | Freq: Every day | SUBCUTANEOUS | Status: DC

## 2014-01-05 MED ORDER — LISINOPRIL 10 MG PO TABS: 10.0000 mg | ORAL_TABLET | Freq: Every day | ORAL | Status: DC

## 2014-01-05 MED ORDER — PRAVASTATIN SODIUM 40 MG PO TABS: ORAL_TABLET | ORAL | Status: DC

## 2014-01-05 MED ORDER — METFORMIN HCL 1000 MG PO TABS: ORAL_TABLET | ORAL | Status: DC

## 2014-01-05 MED ORDER — NEEDLES & SYRINGES MISC: Status: DC

## 2014-01-05 MED ORDER — GLIMEPIRIDE 2 MG PO TABS: ORAL_TABLET | ORAL | Status: DC

## 2014-01-05 NOTE — Patient Instructions (Addendum)
Continue all medications as directed. Increase Insulin dose to 25 units at bedtime. Focus on healthy diet and limiting soda intake. Management of stress is important also.  Next appointment is 4 months for complete physical and fasting labs.  Good luck with getting your own home!   Diabetes Mellitus and Food It is important for you to manage your blood sugar (glucose) level. Your blood glucose level can be greatly affected by what you eat. Eating healthier foods in the appropriate amounts throughout the day at about the same time each day will help you control your blood glucose level. It can also help slow or prevent worsening of your diabetes mellitus. Healthy eating may even help you improve the level of your blood pressure and reach or maintain a healthy weight.  HOW CAN FOOD AFFECT ME? Carbohydrates Carbohydrates affect your blood glucose level more than any other type of food. Your dietitian will help you determine how many carbohydrates to eat at each meal and teach you how to count carbohydrates. Counting carbohydrates is important to keep your blood glucose at a healthy level, especially if you are using insulin or taking certain medicines for diabetes mellitus. Alcohol Alcohol can cause sudden decreases in blood glucose (hypoglycemia), especially if you use insulin or take certain medicines for diabetes mellitus. Hypoglycemia can be a life-threatening condition. Symptoms of hypoglycemia (sleepiness, dizziness, and disorientation) are similar to symptoms of having too much alcohol.  If your health care provider has given you approval to drink alcohol, do so in moderation and use the following guidelines:  Women should not have more than one drink per day, and men should not have more than two drinks per day. One drink is equal to:  12 oz of beer.  5 oz of wine.  1 oz of hard liquor.  Do not drink on an empty stomach.  Keep yourself hydrated. Have water, diet soda, or unsweetened  iced tea.  Regular soda, juice, and other mixers might contain a lot of carbohydrates and should be counted. WHAT FOODS ARE NOT RECOMMENDED? As you make food choices, it is important to remember that all foods are not the same. Some foods have fewer nutrients per serving than other foods, even though they might have the same number of calories or carbohydrates. It is difficult to get your body what it needs when you eat foods with fewer nutrients. Examples of foods that you should avoid that are high in calories and carbohydrates but low in nutrients include:  Trans fats (most processed foods list trans fats on the Nutrition Facts label).  Regular soda.  Juice.  Candy.  Sweets, such as cake, pie, doughnuts, and cookies.  Fried foods. WHAT FOODS CAN I EAT? Have nutrient-rich foods, which will nourish your body and keep you healthy. The food you should eat also will depend on several factors, including:  The calories you need.  The medicines you take.  Your weight.  Your blood glucose level.  Your blood pressure level.  Your cholesterol level. You also should eat a variety of foods, including:  Protein, such as meat, poultry, fish, tofu, nuts, and seeds (lean animal proteins are best).  Fruits.  Vegetables.  Dairy products, such as milk, cheese, and yogurt (low fat is best).  Breads, grains, pasta, cereal, rice, and beans.  Fats such as olive oil, trans fat-free margarine, canola oil, avocado, and olives. DOES EVERYONE WITH DIABETES MELLITUS HAVE THE SAME MEAL PLAN? Because every person with diabetes mellitus is different, there  is not one meal plan that works for everyone. It is very important that you meet with a dietitian who will help you create a meal plan that is just right for you. Document Released: 09/28/2004 Document Revised: 01/06/2013 Document Reviewed: 11/28/2012 [facility] Patient Information 2015 [place], [place]. This information is not intended to replace  advice given to you by your health care provider. Make sure you discuss any questions you have with your health care provider.   Smoking Cessation, Tips for Success If you are ready to quit smoking, congratulations! You have chosen to help yourself be healthier. Cigarettes bring nicotine, tar, carbon monoxide, and other irritants into your body. Your lungs, heart, and blood vessels will be able to work better without these poisons. There are many different ways to quit smoking. Nicotine gum, nicotine patches, a nicotine inhaler, or nicotine nasal spray can help with physical craving. Hypnosis, support groups, and medicines help break the habit of smoking. WHAT THINGS CAN I DO TO MAKE QUITTING EASIER?  Here are some tips to help you quit for good:  Pick a date when you will quit smoking completely. Tell all of your friends and family about your plan to quit on that date.  Do not try to slowly cut down on the number of cigarettes you are smoking. Pick a quit date and quit smoking completely starting on that day.  Throw away all cigarettes.   Clean and remove all ashtrays from your home, work, and car.  On a card, write down your reasons for quitting. Carry the card with you and read it when you get the urge to smoke.  Cleanse your body of nicotine. Drink enough water and fluids to keep your urine clear or pale yellow. Do this after quitting to flush the nicotine from your body.  Learn to predict your moods. Do not let a bad situation be your excuse to have a cigarette. Some situations in your life might tempt you into wanting a cigarette.  Never have "just one" cigarette. It leads to wanting another and another. Remind yourself of your decision to quit.  Change habits associated with smoking. If you smoked while driving or when feeling stressed, try other activities to replace smoking. Stand up when drinking your coffee. Brush your teeth after eating. Sit in a different chair when you read the  paper. Avoid alcohol while trying to quit, and try to drink fewer caffeinated beverages. Alcohol and caffeine may urge you to smoke.  Avoid foods and drinks that can trigger a desire to smoke, such as sugary or spicy foods and alcohol.  Ask people who smoke not to smoke around you.  Have something planned to do right after eating or having a cup of coffee. For example, plan to take a walk or exercise.  Try a relaxation exercise to calm you down and decrease your stress. Remember, you may be tense and nervous for the first 2 weeks after you quit, but this will pass.  Find new activities to keep your hands busy. Play with a pen, coin, or rubber band. Doodle or draw things on paper.  Brush your teeth right after eating. This will help cut down on the craving for the taste of tobacco after meals. You can also try mouthwash.   Use oral substitutes in place of cigarettes. Try using lemon drops, carrots, cinnamon sticks, or chewing gum. Keep them handy so they are available when you have the urge to smoke.  When you have the urge to  smoke, try deep breathing.  Designate your home as a nonsmoking area.  If you are a heavy smoker, ask your health care provider about a prescription for nicotine chewing gum. It can ease your withdrawal from nicotine.  Reward yourself. Set aside the cigarette money you save and buy yourself something nice.  Look for support from others. Join a support group or smoking cessation program. Ask someone at home or at work to help you with your plan to quit smoking.  Always ask yourself, "Do I need this cigarette or is this just a reflex?" Tell yourself, "Today, I choose not to smoke," or "I do not want to smoke." You are reminding yourself of your decision to quit.  Do not replace cigarette smoking with electronic cigarettes (commonly called e-cigarettes). The safety of e-cigarettes is unknown, and some may contain harmful chemicals.  If you relapse, do not give up!  Plan ahead and think about what you will do the next time you get the urge to smoke. HOW WILL I FEEL WHEN I QUIT SMOKING? You may have symptoms of withdrawal because your body is used to nicotine (the addictive substance in cigarettes). You may crave cigarettes, be irritable, feel very hungry, cough often, get headaches, or have difficulty concentrating. The withdrawal symptoms are only temporary. They are strongest when you first quit but will go away within 10-14 days. When withdrawal symptoms occur, stay in control. Think about your reasons for quitting. Remind yourself that these are signs that your body is healing and getting used to being without cigarettes. Remember that withdrawal symptoms are easier to treat than the major diseases that smoking can cause.  Even after the withdrawal is over, expect periodic urges to smoke. However, these cravings are generally short lived and will go away whether you smoke or not. Do not smoke! WHAT RESOURCES ARE AVAILABLE TO HELP ME QUIT SMOKING? Your health care provider can direct you to community resources or hospitals for support, which may include:  Group support.  Education.  Hypnosis.  Therapy. Document Released: 09/30/2003 Document Revised: 05/18/2013 Document Reviewed: 06/19/2012 [facility] Patient Information 2015 [place], [place]. This information is not intended to replace advice given to you by your health care provider. Make sure you discuss any questions you have with your health care provider.

## 2014-01-05 NOTE — Progress Notes (Signed)
Subjective:    Patient ID: [name], female    DOB: 25-Jan-1945, 68 y.o.   MRN: [id number]  HPI  This 68 y.o. AA female has Type II DM, is compliant w/ medications w/o adverse effects. No reported FSBS. Last A1c= 8.4% in August 2015. Stressful home environment; pt has applied for housing through [employer].  Hypertension is well controlled; pt denies diaphoresis, abnormal weight gain, CP or tightness, palpitations, SOB, DOE, edema, HA, dizziness, numbness, weakness or syncope.  Tobacco user- pt continues to smoke 1.5 ppd; this is her stress reliever. She admits inability to contemplate quitting at this time. She has no symptoms. Last CXR was July 2012- Impression: no evidence of acute cardiac or pulmonary process.  HCM:  Pt declines Tetanus immunization today.   Patient Active Problem List   Diagnosis Date Noted  . Nuclear sclerotic cataract of both eyes 04/08/2013  . Other and unspecified hyperlipidemia 04/17/2012  . Type II or unspecified type diabetes mellitus without mention of complication, uncontrolled 09/13/2011  . HTN (hypertension) 09/13/2011  . Tobacco user 09/13/2011  . DDD (degenerative disc disease), lumbar 09/13/2011  . History of rectal polypectomy 08/31/2010    Prior to Admission medications   Medication Sig Start Date End Date Taking? Authorizing Provider  aspirin 81 MG tablet Take 81 mg by mouth daily.     Yes Historical Provider, MD  glimepiride (AMARYL) 2 MG tablet Take 1/2 tablet twice daily with meals. 02/10/13  Yes Barton Fanny, MD  hydrochlorothiazide (MICROZIDE) 12.5 MG capsule Take 1 capsule (12.5 mg total) by mouth daily. 02/10/13  Yes Barton Fanny, MD  insulin detemir (LEVEMIR) 100 UNIT/ML injection Inject 0.2 mLs (20 Units total) into the skin at bedtime. 02/10/13  Yes Barton Fanny, MD  lisinopril (PRINIVIL,ZESTRIL) 10 MG tablet Take 1 tablet (10 mg total) by mouth daily. 02/10/13  Yes Barton Fanny, MD    metFORMIN (GLUCOPHAGE) 1000 MG tablet Take 1 tablet by mouth  twice a day with a meal 02/10/13  Yes Barton Fanny, MD  Needles & Syringes MISC Insulin needles and syringes 01/05/14  Yes Barton Fanny, MD  pravastatin (PRAVACHOL) 40 MG tablet Take 1 tablet by mouth every PM after a meal. 02/10/13  Yes Barton Fanny, MD  Cholecalciferol (VITAMIN D3) 1000 UNITS CAPS Take 2 tablets by mouth daily.      Historical Provider, MD    SOC and FAM HX reviewed.   Review of Systems  As per HPI.     Objective:   Physical Exam  Constitutional: She is oriented to person, place, and time. She appears well-developed and well-nourished. No distress.  Smells of cigarette smoke.  HENT:  Head: Normocephalic and atraumatic.  Right Ear: External ear normal.  Left Ear: External ear normal.  Nose: Nose normal.  Mouth/Throat: Oropharynx is clear and moist.  Eyes: Conjunctivae and EOM are normal. Pupils are equal, round, and reactive to light. No scleral icterus.  Neck: Normal range of motion. Neck supple.  Cardiovascular: Normal rate and regular rhythm.   Pulmonary/Chest: Effort normal and breath sounds normal. No respiratory distress.  Musculoskeletal: Normal range of motion. She exhibits no edema or tenderness.  Neurological: She is alert and oriented to person, place, and time. No cranial nerve deficit. She exhibits normal muscle tone. Coordination normal.  Skin: Skin is warm and dry. She is not diaphoretic.  Psychiatric: She has a normal mood and affect. Her behavior is normal. Judgment and thought content  normal.  Nursing note and vitals reviewed.   Results for orders placed or performed in visit on 01/05/14  POCT glycosylated hemoglobin (Hb A1C)  Result Value Ref Range   Hemoglobin A1C 8.5        Assessment & Plan:  Type II diabetes mellitus, uncontrolled - Increase Insulin dose to 25 units at bedtime. Continue oral medications. Monitor carb intake and avoid highly concentrated  drinks and juices. Pt voices understanding. Increase physical activity. She will work on stress reduction. Plan: HM Diabetes Foot Exam, POCT glycosylated hemoglobin (Hb A1C), COMPLETE METABOLIC PANEL WITH GFR  Tachycardia- Monitor; increase hydration (water). Reduce caffeine intake.  Essential hypertension - Stable on current medication. Plan: COMPLETE METABOLIC PANEL WITH GFR  Meds ordered this encounter  Medications  . Needles & Syringes MISC    Sig: Insulin needles and syringes    Dispense:  100 each    Refill:  3  . metFORMIN (GLUCOPHAGE) 1000 MG tablet    Sig: Take 1 tablet by mouth  twice a day with a meal    Dispense:  180 tablet    Refill:  3  . lisinopril (PRINIVIL,ZESTRIL) 10 MG tablet    Sig: Take 1 tablet (10 mg total) by mouth daily.    Dispense:  90 tablet    Refill:  3  . pravastatin (PRAVACHOL) 40 MG tablet    Sig: Take 1 tablet by mouth every PM after a meal.    Dispense:  90 tablet    Refill:  3  . glimepiride (AMARYL) 2 MG tablet    Sig: Take 1/2 tablet twice daily with meals.    Dispense:  90 tablet    Refill:  3  . insulin detemir (LEVEMIR) 100 UNIT/ML injection    Sig: Inject 0.25 mLs (25 Units total) into the skin at bedtime.    Dispense:  10 mL    Refill:  12

## 2014-01-25 ENCOUNTER — Other Ambulatory Visit: Payer: Self-pay | Admitting: Family Medicine

## 2014-03-08 ENCOUNTER — Other Ambulatory Visit: Payer: Self-pay | Admitting: Family Medicine

## 2014-03-08 DIAGNOSIS — Z1231 Encounter for screening mammogram for malignant neoplasm of breast: Secondary | ICD-10-CM

## 2014-04-09 LAB — HM DIABETES EYE EXAM

## 2014-04-12 ENCOUNTER — Ambulatory Visit (HOSPITAL_COMMUNITY)
Admission: RE | Admit: 2014-04-12 | Discharge: 2014-04-12 | Disposition: A | Discharge status: Home / Self Care | Payer: Medicare Other | Source: Ambulatory Visit | Attending: Family Medicine | Admitting: Family Medicine

## 2014-04-12 DIAGNOSIS — Z1231 Encounter for screening mammogram for malignant neoplasm of breast: Secondary | ICD-10-CM | POA: Diagnosis present

## 2014-04-20 ENCOUNTER — Encounter: Payer: Self-pay | Admitting: *Deleted

## 2014-05-12 ENCOUNTER — Encounter: Payer: Self-pay | Admitting: Family Medicine

## 2014-05-12 ENCOUNTER — Ambulatory Visit (INDEPENDENT_AMBULATORY_CARE_PROVIDER_SITE_OTHER): Payer: Medicare Other

## 2014-05-12 ENCOUNTER — Ambulatory Visit (INDEPENDENT_AMBULATORY_CARE_PROVIDER_SITE_OTHER): Payer: Medicare Other | Admitting: Family Medicine

## 2014-05-12 VITALS — BP 106/67 | HR 91 | Temp 98.8°F | Resp 16 | Ht 67.0 in | Wt 155.6 lb

## 2014-05-12 DIAGNOSIS — Z1212 Encounter for screening for malignant neoplasm of rectum: Secondary | ICD-10-CM

## 2014-05-12 DIAGNOSIS — F17210 Nicotine dependence, cigarettes, uncomplicated: Secondary | ICD-10-CM | POA: Diagnosis not present

## 2014-05-12 DIAGNOSIS — E1165 Type 2 diabetes mellitus with hyperglycemia: Secondary | ICD-10-CM | POA: Diagnosis not present

## 2014-05-12 DIAGNOSIS — Z0000 Encounter for general adult medical examination without abnormal findings: Secondary | ICD-10-CM

## 2014-05-12 DIAGNOSIS — I10 Essential (primary) hypertension: Secondary | ICD-10-CM | POA: Diagnosis not present

## 2014-05-12 DIAGNOSIS — E785 Hyperlipidemia, unspecified: Secondary | ICD-10-CM

## 2014-05-12 DIAGNOSIS — Z86010 Personal history of colonic polyps: Secondary | ICD-10-CM

## 2014-05-12 DIAGNOSIS — Z9889 Other specified postprocedural states: Secondary | ICD-10-CM

## 2014-05-12 DIAGNOSIS — Z1211 Encounter for screening for malignant neoplasm of colon: Secondary | ICD-10-CM

## 2014-05-12 DIAGNOSIS — E119 Type 2 diabetes mellitus without complications: Secondary | ICD-10-CM | POA: Diagnosis not present

## 2014-05-12 DIAGNOSIS — IMO0002 Reserved for concepts with insufficient information to code with codable children: Secondary | ICD-10-CM

## 2014-05-12 LAB — LIPID PANEL
CHOL/HDL RATIO: 3.5 ratio
CHOLESTEROL: 155 mg/dL (ref 0–200)
HDL: 44 mg/dL — AB (ref >=46)
LDL Cholesterol: 79 mg/dL (ref 0–99)
TRIGLYCERIDES: 162 mg/dL — AB (ref <150)
VLDL: 32 mg/dL (ref 0–40)

## 2014-05-12 LAB — CBC
HCT: 43.4 % (ref 36.0–46.0)
Hemoglobin: 14.9 g/dL (ref 12.0–15.0)
MCH: 28.6 pg (ref 26.0–34.0)
MCHC: 34.3 g/dL (ref 30.0–36.0)
MCV: 83.3 fL (ref 78.0–100.0)
MPV: 10.1 fL (ref 8.6–12.4)
PLATELETS: 274 10*3/uL (ref 150–400)
RBC: 5.21 MIL/uL — ABNORMAL HIGH (ref 3.87–5.11)
RDW: 14.3 % (ref 11.5–15.5)
WBC: 8.1 10*3/uL (ref 4.0–10.5)

## 2014-05-12 LAB — COMPLETE METABOLIC PANEL WITH GFR
ALK PHOS: 83 U/L (ref 39–117)
ALT: 11 U/L (ref 0–35)
AST: 13 U/L (ref 0–37)
Albumin: 3.9 g/dL (ref 3.5–5.2)
BUN: 18 mg/dL (ref 6–23)
CALCIUM: 9.2 mg/dL (ref 8.4–10.5)
CHLORIDE: 106 meq/L (ref 96–112)
CO2: 21 mEq/L (ref 19–32)
CREATININE: 0.94 mg/dL (ref 0.50–1.10)
GFR, Est African American: 72 mL/min
GFR, Est Non African American: 62 mL/min
Glucose, Bld: 104 mg/dL — ABNORMAL HIGH (ref 70–99)
Potassium: 4.4 mEq/L (ref 3.5–5.3)
Sodium: 141 mEq/L (ref 135–145)
Total Bilirubin: 0.4 mg/dL (ref 0.2–1.2)
Total Protein: 6.7 g/dL (ref 6.0–8.3)

## 2014-05-12 LAB — POCT GLYCOSYLATED HEMOGLOBIN (HGB A1C): Hemoglobin A1C: 7.5

## 2014-05-12 MED ORDER — ALBUTEROL SULFATE 108 (90 BASE) MCG/ACT IN AEPB: 2.0000 | INHALATION_SPRAY | Freq: Four times a day (QID) | RESPIRATORY_TRACT | Status: DC | PRN

## 2014-05-12 NOTE — Progress Notes (Signed)
Subjective:    Patient ID: [name], female    DOB: 04-15-1945, 69 y.o.   MRN: [id number]  HPI  This 69 y.o female is here for [facility] Subsequent annual exam. She has Type II DM, HTN and is a chronic smoker. She plans to quit this year, currently smoking < 1 ppd. Last A1c in Dec 2015 = 8.5%.  HCM: PAP- NA; S/P TAH in 2000.           MMG- Current (March 2016 - negative).           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           IMM- Current.           Vision- Annually.           Dental-   Patient Active Problem List   Diagnosis Date Noted  . Nuclear sclerotic cataract of both eyes 04/08/2013  . Other and unspecified hyperlipidemia 04/17/2012  . Type II or unspecified type diabetes mellitus without mention of complication, uncontrolled 09/13/2011  . HTN (hypertension) 09/13/2011  . Tobacco user 09/13/2011  . DDD (degenerative disc disease), lumbar 09/13/2011  . History of rectal polypectomy 08/31/2010    Prior to Admission medications   Medication Sig Start Date End Date Taking? Authorizing Provider  aspirin 81 MG tablet Take 81 mg by mouth daily.     Yes Historical Provider, MD  Cholecalciferol (VITAMIN D3) 1000 UNITS CAPS Take 2 tablets by mouth daily.     Yes Historical Provider, MD  glimepiride (AMARYL) 2 MG tablet Take 1/2 tablet twice daily with meals. 01/05/14  Yes Barton Fanny, MD  hydrochlorothiazide (MICROZIDE) 12.5 MG capsule Take 1 capsule (12.5 mg total) by mouth daily. 02/10/13  Yes Barton Fanny, MD  insulin detemir (LEVEMIR) 100 UNIT/ML injection Inject 0.25 mLs (25 Units total) into the skin at bedtime. 01/05/14  Yes Barton Fanny, MD  lisinopril (PRINIVIL,ZESTRIL) 10 MG tablet Take 1 tablet (10 mg total) by mouth daily. 01/05/14  Yes Barton Fanny, MD  metFORMIN (GLUCOPHAGE) 1000 MG tablet Take 1 tablet by mouth  twice a day with  a meal 01/05/14  Yes Barton Fanny, MD  pravastatin (PRAVACHOL) 40 MG tablet Take 1 tablet by mouth every PM after a meal. 01/05/14  Yes Barton Fanny, MD  Insulin Syringe-Needle U-100 (BD INSULIN SYRINGE ULTRAFINE) 31G X 5/16" 1 ML MISC Use to inject insulin daily. Dx code: E11.65 01/26/14   Barton Fanny, MD  Needles & Syringes MISC Insulin needles and syringes 01/05/14   Barton Fanny, MD    Past Surgical History  Procedure Laterality Date  . Rectal polypectomy  05/16/2010    TVAdenoma polyp  . Back surgery      plate put in back  . Tubal ligation    . Abdominal hysterectomy  2000    History   Social History  . Marital Status: Divorced    Spouse Name: N/A  . Number of Children: N/A  . Years of Education: N/A   Occupational History  . Not on file.   Social History Main Topics  . Smoking status: Current Every Day Smoker -- 1.50 packs/day for 50 years    Types: Cigarettes  . Smokeless tobacco: Not on  file  . Alcohol Use: No  . Drug Use: No  . Sexual Activity: Not on file   Other Topics Concern  . Not on file   Social History Narrative    Family History  Problem Relation Age of Onset  . Diabetes Sister   . Diabetes Brother   . Hypertension Brother   . Cancer Mother 65    colon and lung  . Diabetes Mother   . Heart disease Mother   . Hyperlipidemia Mother   . Cancer Father     stomach  . Diabetes Maternal Grandmother     Review of Systems  Constitutional: Negative.   HENT: Negative.   Eyes: Negative.   Respiratory: Positive for cough and shortness of breath.   Cardiovascular: Negative.   Gastrointestinal: Negative.   Endocrine: Negative.   Genitourinary: Negative.   Musculoskeletal: Negative.   Skin: Negative.   Allergic/Immunologic: Negative.   Neurological: Negative.   Hematological: Negative.   Psychiatric/Behavioral: Negative.        Objective:   Physical Exam  Constitutional: She is oriented to person, place, and  time. She appears well-developed and well-nourished. No distress.  Blood pressure 106/67, pulse 91, temperature 98.8 F (37.1 C), temperature source Oral, resp. rate 16, height 5\' 7"  (1.702 m), weight 155 lb 9.6 oz (70.58 kg), SpO2 95 %.    HENT:  Head: Normocephalic and atraumatic.  Right Ear: Hearing, tympanic membrane and external ear normal.  Left Ear: Hearing, tympanic membrane, external ear and ear canal normal.  Nose: Nose normal. No nasal deformity or septal deviation.  Mouth/Throat: Uvula is midline, oropharynx is clear and moist and mucous membranes are normal. No oral lesions. Normal dentition.  Eyes: Conjunctivae, EOM and lids are normal. Pupils are equal, round, and reactive to light. No scleral icterus.  Wears corrective lenses.  Neck: Trachea normal, normal range of motion and phonation normal. Neck supple. No JVD present. No spinous process tenderness and no muscular tenderness present. Carotid bruit is not present. No thyroid mass and no thyromegaly present.  Cardiovascular: Normal rate, regular rhythm, S1 normal, S2 normal and normal heart sounds.   No extrasystoles are present. PMI is not displaced.  Exam reveals no gallop and no friction rub.   No murmur heard. Pulses:      Carotid pulses are 2+ on the right side, and 2+ on the left side.      Radial pulses are 2+ on the right side, and 2+ on the left side.       Femoral pulses are 2+ on the right side, and 2+ on the left side.      Dorsalis pedis pulses are 1+ on the right side, and 1+ on the left side.       Posterior tibial pulses are 1+ on the right side, and 1+ on the left side.  Pulmonary/Chest: Effort normal and breath sounds normal. No respiratory distress. She has no decreased breath sounds. She has no wheezes. She has no rhonchi.  BS difficult to auscultate at bases.  Abdominal: Soft. Normal appearance and bowel sounds are normal. She exhibits no abdominal bruit and no pulsatile midline mass. There is no  hepatosplenomegaly. There is no tenderness. There is no guarding and no CVA tenderness.  Genitourinary:  Deferred.  Musculoskeletal:       Cervical back: Normal.       Lumbar back: Normal.  Remainder of exam unremarkable.  Lymphadenopathy:       Head (right side): No submental,  no submandibular, no tonsillar, no preauricular, no posterior auricular and no occipital adenopathy present.       Head (left side): No submental, no submandibular, no tonsillar, no preauricular, no posterior auricular and no occipital adenopathy present.    She has no cervical adenopathy.       Right: No inguinal and no supraclavicular adenopathy present.       Left: No inguinal and no supraclavicular adenopathy present.  Neurological: She is alert and oriented to person, place, and time. She has normal strength and normal reflexes. She displays no atrophy. No sensory deficit. She exhibits normal muscle tone. She displays a negative Romberg sign. Coordination and gait normal.  See DM FOOT EXAM (normal).  Skin: Skin is warm, dry and intact. No ecchymosis, no lesion and no rash noted. She is not diaphoretic. No cyanosis or erythema. Nails show no clubbing.  Psychiatric: Her speech is normal and behavior is normal. Judgment normal. Cognition and memory are normal.  Nursing note and vitals reviewed.   Results for orders placed or performed in visit on 05/12/14  POCT glycosylated hemoglobin (Hb A1C)  Result Value Ref Range   Hemoglobin A1C 7.5     [facility] reading (PRIMARY) by  Dr. Leward Quan: CXR- Normal cardiac silhouette; mediastinum and lung fields normal. No infiltrates, effusions, lesions or masses.     Assessment & Plan:  Medicare annual wellness visit, subsequent  Type II diabetes mellitus, uncontrolled - Improved control on current medications. No refill needed at this time. Plan: HM Diabetes Foot Exam, COMPLETE METABOLIC PANEL WITH GFR, POCT glycosylated hemoglobin (Hb A1C)  Cigarette smoker two packs a day  or less - Encouraged cessation. RX: ProAir RespiClick for prn use >> SOB or DOE. Plan: DG Chest 2 View, CBC  Essential hypertension - Stable and well controlled on current medications. No refills needed at this time. Plan: COMPLETE METABOLIC PANEL WITH GFR, CBC  Hyperlipidemia LDL goal <100 - Tolerating statin w/o adverse effects. Plan: Lipid panel  Screening for colorectal cancer - Plan: IFOBT POC (occult bld, rslt in office), IFOBT POC (occult bld, rslt in office), IFOBT POC (occult bld, rslt in office)  History of colonoscopy with polypectomy - Pt was supposed to follow-up w/ Dr. Benson Norway in May 2013. She is instructed to contact Dr. Ulyses Amor office to sch OV. Plan: IFOBT POC (occult bld, rslt in office), IFOBT POC (occult bld, rslt in office), IFOBT POC (occult bld, rslt in office)   Meds ordered this encounter  Medications  . Albuterol Sulfate (PROAIR RESPICLICK) 466 (90 BASE) MCG/ACT AEPB    Sig: Inhale 2 Act into the lungs 4 (four) times daily as needed.    Dispense:  3 each    Refill:  1

## 2014-05-12 NOTE — Patient Instructions (Addendum)
Keeping You Healthy  Get These Tests  Blood Pressure- Have your blood pressure checked by your healthcare provider at least once a year.  Normal blood pressure is 120/80.  Weight- Have your body mass index (BMI) calculated to screen for obesity.  BMI is a measure of body fat based on height and weight.  You can calculate your own BMI at [email or web address]  Cholesterol- Have your cholesterol checked every year.  Diabetes- Have your blood sugar checked every year if you have high blood pressure, high cholesterol, a family history of diabetes or if you are overweight.  Pap Smear- Have a pap smear every 1 to 3 years if you have been sexually active.  If you are older than 65 and recent pap smears have been normal you may not need additional pap smears.  In addition, if you have had a hysterectomy  For benign disease additional pap smears are not necessary.  Mammogram-Yearly mammograms are essential for early detection of breast cancer  Screening for Colon Cancer- Colonoscopy starting at age 50. Screening may begin sooner depending on your family history and other health conditions.  Follow up colonoscopy as directed by your Gastroenterologist.  Screening for Osteoporosis- Screening begins at age 65 with bone density scanning, sooner if you are at higher risk for developing Osteoporosis.  Get these medicines  Calcium with Vitamin D- Your body requires 1200-1500 mg of Calcium a day and 800-1000 IU of Vitamin D a day.  You can only absorb 500 mg of Calcium at a time therefore Calcium must be taken in 2 or 3 separate doses throughout the day.  Hormones- Hormone therapy has been associated with increased risk for certain cancers and heart disease.  Talk to your healthcare provider about if you need relief from menopausal symptoms.  Aspirin- Ask your healthcare provider about taking Aspirin to prevent Heart Disease and Stroke.  Get these Immuniztions  Flu shot- Every fall  Pneumonia  shot- Once after the age of 65; if you are younger ask your healthcare provider if you need a pneumonia shot.  Tetanus- Every ten years.  Zostavax- Once after the age of 60 to prevent shingles.  Take these steps  Don't smoke- Your healthcare provider can help you quit. For tips on how to quit, ask your healthcare provider or go to www.smokefree.gov or call 1-800 QUIT-NOW.  Be physically active- Exercise 5 days a week for a minimum of 30 minutes.  If you are not already physically active, start slow and gradually work up to 30 minutes of moderate physical activity.  Try walking, dancing, bike riding, swimming, etc.  Eat a healthy diet- Eat a variety of healthy foods such as fruits, vegetables, whole grains, low fat milk, low fat cheeses, yogurt, lean meats, chicken, fish, eggs, dried beans, tofu, etc.  For more information go to www.thenutritionsource.org  Dental visit- Brush and floss teeth twice daily; visit your dentist twice a year.  Eye exam- Visit your Optometrist or Ophthalmologist yearly.  Drink alcohol in moderation- Limit alcohol intake to one drink or less a day.  Never drink and drive.  Depression- Your emotional health is as important as your physical health.  If you're feeling down or losing interest in things you normally enjoy, please talk to your healthcare provider.  Seat Belts- can save your life; always wear one  Smoke/Carbon Monoxide detectors- These detectors need to be installed on the appropriate level of your home.  Replace batteries at least once a year.  Violence- If anyone   is threatening or hurting you, please tell your healthcare provider.  Living Will/ Health care power of attorney- Discuss with your healthcare provider and family.     Chronic Obstructive Pulmonary Disease Chronic obstructive pulmonary disease (COPD) is a common lung problem. In COPD, the flow of air from the lungs is limited. The way your lungs work will probably never return to normal,  but there are things you can do to improve your lungs and make yourself feel better. HOME CARE  Take all medicines as told by your doctor.  Avoid medicines or cough syrups that dry up your airway (such as antihistamines) and do not allow you to get rid of thick spit. You do not need to avoid them if told differently by your doctor.  If you smoke, stop. Smoking makes the problem worse.  Avoid being around things that make your breathing worse (like smoke, chemicals, and fumes).  Use oxygen therapy and therapy to help improve your lungs (pulmonary rehabilitation) if told by your doctor. If you need home oxygen therapy, ask your doctor if you should buy a tool to measure your oxygen level (oximeter).  Avoid people who have a sickness you can catch (contagious).  Avoid going outside when it is very hot, cold, or humid.  Eat healthy foods. Eat smaller meals more often. Rest before meals.  Stay active, but remember to also rest.  Make sure to get all the shots (vaccines) your doctor recommends. Ask your doctor if you need a pneumonia shot.  Learn and use tips on how to relax.  Learn and use tips on how to control your breathing as told by your doctor. Try:  Breathing in (inhaling) through your nose for 1 second. Then, pucker your lips and breath out (exhale) through your lips for 2 seconds.  Putting one hand on your belly (abdomen). Breathe in slowly through your nose for 1 second. Your hand on your belly should move out. Pucker your lips and breathe out slowly through your lips. Your hand on your belly should move in as you breathe out.  Learn and use controlled coughing to clear thick spit from your lungs. The steps are: 4. Lean your head a little forward. 5. Breathe in deeply. 6. Try to hold your breath for 3 seconds. 7. Keep your mouth slightly open while coughing 2 times. 8. Spit any thick spit out into a tissue. 9. Rest and do the steps again 1 or 2 times as needed. GET HELP  IF:  You cough up more thick spit than usual.  There is a change in the color or thickness of the spit.  It is harder to breathe than usual.  Your breathing is faster than usual. GET HELP RIGHT AWAY IF:   You have shortness of breath while resting.  You have shortness of breath that stops you from:  Being able to talk.  Doing normal activities.  You chest hurts for longer than 5 minutes.  Your skin color is more blue than usual.  Your pulse oximeter shows that you have low oxygen for longer than 5 minutes. MAKE SURE YOU:   Understand these instructions.  Will watch your condition.  Will get help right away if you are not doing well or get worse. Document Released: 06/20/2007 Document Revised: 05/18/2013 Document Reviewed: 08/28/2012 [facility] Patient Information 2015 [place], [place]. This information is not intended to replace advice given to you by your health care provider. Make sure you discuss any questions you have with your  health care provider.   Your Diabetes is much better controlled; A1c= 7.5%.  Continue current medications and other healthy choices. Try to work on quitting tobacco use; cut back to no more than 1 pack per day.  You should follow-up in 4 months for Diabetes management and to meet your new provider. I think you could see one of the females PA-C (T. Brewington, N. Bush or S. English).

## 2014-05-14 LAB — IFOBT (OCCULT BLOOD)
IFOBT: NEGATIVE
IFOBT: NEGATIVE

## 2014-05-16 ENCOUNTER — Encounter: Payer: Self-pay | Admitting: Family Medicine

## 2014-05-19 ENCOUNTER — Other Ambulatory Visit: Payer: Self-pay | Admitting: Family Medicine

## 2014-05-19 DIAGNOSIS — Z86010 Personal history of colonic polyps: Secondary | ICD-10-CM | POA: Diagnosis not present

## 2014-05-19 DIAGNOSIS — I10 Essential (primary) hypertension: Secondary | ICD-10-CM

## 2014-05-19 DIAGNOSIS — E785 Hyperlipidemia, unspecified: Secondary | ICD-10-CM

## 2014-05-19 DIAGNOSIS — IMO0001 Reserved for inherently not codable concepts without codable children: Secondary | ICD-10-CM

## 2014-05-19 DIAGNOSIS — Z800 Family history of malignant neoplasm of digestive organs: Secondary | ICD-10-CM | POA: Diagnosis not present

## 2014-05-19 DIAGNOSIS — E119 Type 2 diabetes mellitus without complications: Secondary | ICD-10-CM | POA: Diagnosis not present

## 2014-05-19 DIAGNOSIS — E1169 Type 2 diabetes mellitus with other specified complication: Secondary | ICD-10-CM

## 2014-05-19 DIAGNOSIS — E1165 Type 2 diabetes mellitus with hyperglycemia: Principal | ICD-10-CM

## 2014-06-01 DIAGNOSIS — Z86010 Personal history of colonic polyps: Secondary | ICD-10-CM | POA: Diagnosis not present

## 2014-07-12 ENCOUNTER — Other Ambulatory Visit: Payer: Self-pay

## 2014-07-16 ENCOUNTER — Other Ambulatory Visit: Payer: Self-pay | Admitting: Family Medicine

## 2014-07-23 ENCOUNTER — Other Ambulatory Visit: Payer: Self-pay

## 2014-07-23 MED ORDER — HYDROCHLOROTHIAZIDE 12.5 MG PO CAPS: ORAL_CAPSULE | ORAL | Status: DC

## 2014-09-21 ENCOUNTER — Encounter: Payer: Self-pay | Admitting: Physician Assistant

## 2014-09-21 ENCOUNTER — Ambulatory Visit (INDEPENDENT_AMBULATORY_CARE_PROVIDER_SITE_OTHER): Payer: Medicare Other | Admitting: Physician Assistant

## 2014-09-21 VITALS — BP 99/67 | HR 103 | Temp 99.2°F | Resp 18 | Ht 67.0 in | Wt 158.0 lb

## 2014-09-21 DIAGNOSIS — IMO0002 Reserved for concepts with insufficient information to code with codable children: Secondary | ICD-10-CM

## 2014-09-21 DIAGNOSIS — R000 Tachycardia, unspecified: Secondary | ICD-10-CM | POA: Diagnosis not present

## 2014-09-21 DIAGNOSIS — N3943 Post-void dribbling: Secondary | ICD-10-CM | POA: Diagnosis not present

## 2014-09-21 DIAGNOSIS — Z23 Encounter for immunization: Secondary | ICD-10-CM | POA: Diagnosis not present

## 2014-09-21 DIAGNOSIS — E1165 Type 2 diabetes mellitus with hyperglycemia: Secondary | ICD-10-CM

## 2014-09-21 DIAGNOSIS — R42 Dizziness and giddiness: Secondary | ICD-10-CM | POA: Diagnosis not present

## 2014-09-21 DIAGNOSIS — I10 Essential (primary) hypertension: Secondary | ICD-10-CM

## 2014-09-21 DIAGNOSIS — E119 Type 2 diabetes mellitus without complications: Secondary | ICD-10-CM | POA: Diagnosis not present

## 2014-09-21 LAB — COMPLETE METABOLIC PANEL WITH GFR
ALT: 15 U/L (ref 6–29)
AST: 15 U/L (ref 10–35)
Albumin: 4.4 g/dL (ref 3.6–5.1)
Alkaline Phosphatase: 104 U/L (ref 33–130)
BUN: 23 mg/dL (ref 7–25)
CHLORIDE: 101 mmol/L (ref 98–110)
CO2: 25 mmol/L (ref 20–31)
Calcium: 10.2 mg/dL (ref 8.6–10.4)
Creat: 1.01 mg/dL — ABNORMAL HIGH (ref 0.50–0.99)
GFR, EST NON AFRICAN AMERICAN: 57 mL/min — AB (ref >=60)
GFR, Est African American: 66 mL/min (ref >=60)
GLUCOSE: 152 mg/dL — AB (ref 65–99)
POTASSIUM: 4.4 mmol/L (ref 3.5–5.3)
SODIUM: 139 mmol/L (ref 135–146)
Total Bilirubin: 0.3 mg/dL (ref 0.2–1.2)
Total Protein: 7.6 g/dL (ref 6.1–8.1)

## 2014-09-21 LAB — POCT URINALYSIS DIPSTICK
Bilirubin, UA: NEGATIVE
Blood, UA: NEGATIVE
GLUCOSE UA: 100
Ketones, UA: NEGATIVE
Leukocytes, UA: NEGATIVE
NITRITE UA: NEGATIVE
PROTEIN UA: NEGATIVE
SPEC GRAV UA: 1.015
UROBILINOGEN UA: 0.2
pH, UA: 5

## 2014-09-21 LAB — CBC
HCT: 45.2 % (ref 36.0–46.0)
Hemoglobin: 15.1 g/dL — ABNORMAL HIGH (ref 12.0–15.0)
MCH: 28.3 pg (ref 26.0–34.0)
MCHC: 33.4 g/dL (ref 30.0–36.0)
MCV: 84.8 fL (ref 78.0–100.0)
MPV: 10.3 fL (ref 8.6–12.4)
Platelets: 297 10*3/uL (ref 150–400)
RBC: 5.33 MIL/uL — AB (ref 3.87–5.11)
RDW: 14.9 % (ref 11.5–15.5)
WBC: 8.9 10*3/uL (ref 4.0–10.5)

## 2014-09-21 LAB — HEMOGLOBIN A1C
Hgb A1c MFr Bld: 7.9 % — ABNORMAL HIGH (ref <5.7)
Mean Plasma Glucose: 180 mg/dL — ABNORMAL HIGH (ref <117)

## 2014-09-21 LAB — TSH: TSH: 1.409 u[IU]/mL (ref 0.350–4.500)

## 2014-09-21 NOTE — Patient Instructions (Addendum)
You will get a phone call regarding the ultrasound of your carotid.    Diabetes and Exercise Diabetes mellitus is a common, chronic disease, in which the pancreas is unable to adequately control blood glucose (sugar) levels. There are 2 types of diabetes. Type 1 diabetes patients are unable to produce insulin, a hormone that causes sugar in the blood to be stored in the body. People with type 1 diabetes may compensate by giving themselves injections of insulin. Type 2 diabetes involves not producing adequate amounts of insulin to control blood glucose levels. People with type 2 diabetes control their blood glucose by monitoring their food intake or by taking medicine. Exercise is an important part of diabetes treatment. During exercise, the muscles use a greater amount of glucose from the blood for energy. This lowers your blood glucose, which is the same effect you would get from taking insulin. It has been shown that endurance athletes are more sensitive to insulin than inactive people. SYMPTOMS  Many people with a mild case of diabetes have no symptoms. However, if left uncontrolled, diabetes can lead to several complications that could be prevented with treatment of the disease. General symptoms of diabetes include:   Frequent urination (polyuria).  Frequent thirst and drinking (polydipsia).  Increased food consumption (polyphagia).  Fatigue.  Poor exercise performance.  Blurred vision.  Inflammation of the vagina (vaginitis) caused by fungal infections.  Skin infections (uncommon).  Numbness in the feet, caused by nerve injury.  Kidney disease. CAUSES  The cause of most cases of diabetes is unknown. In children, diabetes is often due to an autoimmune response to the cells in the pancreas that make insulin. It is also linked with other diseases, such as cystic fibrosis. Diabetes may have a genetic link. PREVENTION  Athletes should strive to begin exercise with blood glucose in a  well-controlled state.  Feet should always be kept clean and dry.  Activities in which low blood sugar levels cannot be treated easily (scuba diving, rock climbing, swimming) should be avoided.  Anticipate alterations in diet or training to avoid low blood sugar (hypoglycemia) and high blood sugar (hyperglycemia).  Athletes should try to increase sugar consumption after strenuous exercise to avoid hypoglycemia.  Short-acting insulin should not be injected into an actively exercising muscle. The athlete should rest the injection site for about 1 hour after exercise.  Patients with diabetes should get routine checkups of the feet to prevent complications. PROGNOSIS  Exercise provides many benefits to the person with diabetes:   Reduced body fat.  Lower blood pressure.  Often, reduced need for medicines.  Improved exercise tolerance.  Lower insulin levels.  Weight loss.  Improved lipid profile (decreased cholesterol and low-density lipoproteins). RELATED COMPLICATIONS  If performed incorrectly, exercise can result in complications of diabetes:   Poor control of blood sugar, when exercise is performed at the wrong time.  Increase in renal disease, from loss of body fluids (dehydration).  Increased risk of nerve injury (neuropathy) when performing exercises that increase foot injury.  Increased risk of eye problems when performing activities that involve breath holding or lowering or jarring the head.  Increased risk of sudden death from exercise in patients with heart disease.  Worsening of hypertension with heavy lifting (more than 10 lb/4.5 kg). Altered blood glucose and insulin dose as a result of mild illness that produces loss of appetite.  Altered uptake of insulin after injection when insulin injection site is changed. NOTE: Exercise can lower blood glucose effectively, but the  effects are short-lasting (no more than a couple of days). Exercise has been shown to  improve your sensitivity to insulin. This may alter how your body responds to a given dose of injected insulin. It is important for every patient with diabetes to know how his or her body may react to exercise, and to adjust insulin dosages accordingly. TREATMENT  Eat about 1 to 3 hours before exercise.  Check blood glucose immediately before and after exercise.  Stop exercise if blood glucose is more than 250 mg/dL.  Stop exercise if blood glucose is less than 100 mg/dL.  Do not exercise within 1 hour of an insulin injection.  Be prepared to treat low blood glucose while exercising. Keep some sugar product with you, such as a candy bar.  For prolonged exercise, use a sports drink to maintain your glucose level.  Replace used-up glucose in the body after exercise.  Consume fluids during and after exercise to avoid dehydration. SEEK MEDICAL CARE IF:  You have vision changes after a run.  You notice a loss of sensation in your feet after exercise.  You have increased numbness, tingling, or pins and needles sensations after exercise.  You have chest pain during or after exercise.  You have a fast, irregular heartbeat (palpitations) during or after exercise.  Your exercise tolerance gets worse.  You have fainting or dizzy spells for brief periods during or after exercise. Document Released: 01/01/2005 Document Revised: 05/18/2013 Document Reviewed: 04/15/2008 [facility] Patient Information 2015 [place], [place]. This information is not intended to replace advice given to you by your health care provider. Make sure you discuss any questions you have with your health care provider. You Can Quit Smoking If you are ready to quit smoking or are thinking about it, congratulations! You have chosen to help yourself be healthier and live longer! There are lots of different ways to quit smoking. Nicotine gum, nicotine patches, a nicotine inhaler, or nicotine nasal spray can help with physical  craving. Hypnosis, support groups, and medicines help break the habit of smoking. TIPS TO GET OFF AND STAY OFF CIGARETTES  Learn to predict your moods. Do not let a bad situation be your excuse to have a cigarette. Some situations in your life might tempt you to have a cigarette.  Ask friends and co-workers not to smoke around you.  Make your home smoke-free.  Never have "just one" cigarette. It leads to wanting another and another. Remind yourself of your decision to quit.  On a card, make a list of your reasons for not smoking. Read it at least the same number of times a day as you have a cigarette. Tell yourself everyday, "I do not want to smoke. I choose not to smoke."  Ask someone at home or work to help you with your plan to quit smoking.  Have something planned after you eat or have a cup of coffee. Take a walk or get other exercise to perk you up. This will help to keep you from overeating.  Try a relaxation exercise to calm you down and decrease your stress. Remember, you may be tense and nervous the first two weeks after you quit. This will pass.  Find new activities to keep your hands busy. Play with a pen, coin, or rubber band. Doodle or draw things on paper.  Brush your teeth right after eating. This will help cut down the craving for the taste of tobacco after meals. You can try mouthwash too.  Try gum, breath  mints, or diet candy to keep something in your mouth. IF YOU SMOKE AND WANT TO QUIT:  Do not stock up on cigarettes. Never buy a carton. Wait until one pack is finished before you buy another.  Never carry cigarettes with you at work or at home.  Keep cigarettes as far away from you as possible. Leave them with someone else.  Never carry matches or a lighter with you.  Ask yourself, "Do I need this cigarette or is this just a reflex?"  Bet with someone that you can quit. Put cigarette money in a piggy bank every morning. If you smoke, you give up the money. If  you do not smoke, by the end of the week, you keep the money.  Keep trying. It takes 21 days to change a habit!  Talk to your doctor about using medicines to help you quit. These include nicotine replacement gum, lozenges, or skin patches. Document Released: 10/28/2008 Document Revised: 03/26/2011 Document Reviewed: 10/28/2008 [facility] Patient Information 2015 [place], [place]. This information is not intended to replace advice given to you by your health care provider. Make sure you discuss any questions you have with your health care provider. Smoking Cessation Quitting smoking is important to your health and has many advantages. However, it is not always easy to quit since nicotine is a very addictive drug. Oftentimes, people try 3 times or more before being able to quit. This document explains the best ways for you to prepare to quit smoking. Quitting takes hard work and a lot of effort, but you can do it. ADVANTAGES OF QUITTING SMOKING  You will live longer, feel better, and live better.  Your body will feel the impact of quitting smoking almost immediately.  Within 20 minutes, blood pressure decreases. Your pulse returns to its normal level.  After 8 hours, carbon monoxide levels in the blood return to normal. Your oxygen level increases.  After 24 hours, the chance of having a heart attack starts to decrease. Your breath, hair, and body stop smelling like smoke.  After 48 hours, damaged nerve endings begin to recover. Your sense of taste and smell improve.  After 72 hours, the body is virtually free of nicotine. Your bronchial tubes relax and breathing becomes easier.  After 2 to 12 weeks, lungs can hold more air. Exercise becomes easier and circulation improves.  The risk of having a heart attack, stroke, cancer, or lung disease is greatly reduced.  After 1 year, the risk of coronary heart disease is cut in half.  After 5 years, the risk of stroke falls to the same as a  nonsmoker.  After 10 years, the risk of lung cancer is cut in half and the risk of other cancers decreases significantly.  After 15 years, the risk of coronary heart disease drops, usually to the level of a nonsmoker.  If you are pregnant, quitting smoking will improve your chances of having a healthy baby.  The people you live with, especially any children, will be healthier.  You will have extra money to spend on things other than cigarettes. QUESTIONS TO THINK ABOUT BEFORE ATTEMPTING TO QUIT You may want to talk about your answers with your health care provider.  Why do you want to quit?  If you tried to quit in the past, what helped and what did not?  What will be the most difficult situations for you after you quit? How will you plan to handle them?  Who can help you through the  tough times? Your family? Friends? A health care provider?  What pleasures do you get from smoking? What ways can you still get pleasure if you quit? Here are some questions to ask your health care provider:  How can you help me to be successful at quitting?  What medicine do you think would be best for me and how should I take it?  What should I do if I need more help?  What is smoking withdrawal like? How can I get information on withdrawal? GET READY  Set a quit date.  Change your environment by getting rid of all cigarettes, ashtrays, matches, and lighters in your home, car, or work. Do not let people smoke in your home.  Review your past attempts to quit. Think about what worked and what did not. GET SUPPORT AND ENCOURAGEMENT You have a better chance of being successful if you have help. You can get support in many ways.  Tell your family, friends, and coworkers that you are going to quit and need their support. Ask them not to smoke around you.  Get individual, group, or telephone counseling and support. Programs are available at [employer] and health centers. Call your local health  department for information about programs in your area.  Spiritual beliefs and practices may help some smokers quit.  Download a "quit meter" on your computer to keep track of quit statistics, such as how long you have gone without smoking, cigarettes not smoked, and money saved.  Get a self-help book about quitting smoking and staying off tobacco. [place] yourself from urges to smoke. Talk to someone, go for a walk, or occupy your time with a task.  Change your normal routine. Take a different route to work. Drink tea instead of coffee. Eat breakfast in a different place.  Reduce your stress. Take a hot bath, exercise, or read a book.  Plan something enjoyable to do every day. Reward yourself for not smoking.  Explore interactive web-based programs that specialize in helping you quit. GET MEDICINE AND USE IT CORRECTLY Medicines can help you stop smoking and decrease the urge to smoke. Combining medicine with the above behavioral methods and support can greatly increase your chances of successfully quitting smoking.  Nicotine replacement therapy helps deliver nicotine to your body without the negative effects and risks of smoking. Nicotine replacement therapy includes nicotine gum, lozenges, inhalers, nasal sprays, and skin patches. Some may be available over-the-counter and others require a prescription.  Antidepressant medicine helps people abstain from smoking, but how this works is unknown. This medicine is available by prescription.  Nicotinic receptor partial agonist medicine simulates the effect of nicotine in your brain. This medicine is available by prescription. Ask your health care provider for advice about which medicines to use and how to use them based on your health history. Your health care provider will tell you what side effects to look out for if you choose to be on a medicine or therapy. Carefully read the information on the package. Do  not use any other product containing nicotine while using a nicotine replacement product.  RELAPSE OR DIFFICULT SITUATIONS Most relapses occur within the first 3 months after quitting. Do not be discouraged if you start smoking again. Remember, most people try several times before finally quitting. You may have symptoms of withdrawal because your body is used to nicotine. You may crave cigarettes, be irritable, feel very hungry, cough often, get headaches, or have difficulty concentrating.  The withdrawal symptoms are only temporary. They are strongest when you first quit, but they will go away within 10-14 days. To reduce the chances of relapse, try to:  Avoid drinking alcohol. Drinking lowers your chances of successfully quitting.  Reduce the amount of caffeine you consume. Once you quit smoking, the amount of caffeine in your body increases and can give you symptoms, such as a rapid heartbeat, sweating, and anxiety.  Avoid smokers because they can make you want to smoke.  Do not let weight gain distract you. Many smokers will gain weight when they quit, usually less than 10 pounds. Eat a healthy diet and stay active. You can always lose the weight gained after you quit.  Find ways to improve your mood other than smoking. FOR MORE INFORMATION  www.smokefree.gov  Document Released: 12/26/2000 Document Revised: 05/18/2013 Document Reviewed: 04/12/2011 [facility] Patient Information 2015 [place], [place]. This information is not intended to replace advice given to you by your health care provider. Make sure you discuss any questions you have with your health care provider.

## 2014-09-21 NOTE — Progress Notes (Signed)
[facility] [address] [place] [address] [phone]  Date:  09/21/2014   Name:  [name]   DOB:  November 22, 1945   MRN:  [id number]  PCP:  Ellsworth Lennox, MD    History of Present Illness:  [name] is a 69 y.o. female patient who present to [facility] for 4 month follow up.     1 month of quick dizziness, with turning or lifting up to fast.  Water intake: drinking water is few, but is normal.    Feels tight, under lower right breast.  Right breast feels a bit tender, no swelling or redness.  Chest tightness,  randomly with coughing.  She will use the inhaler which will help very little.  1 pack per day.  Has difficulty with respi-click proAir, as this is a quiet medication, and feels as if it is not working, though daughter had looked at it and it appears as if the left over dosing is decreasing.     Glucose: 130-140 sometime less fasting glucose. No exercise. Diet: Not real restrictions at this time.    Also discusses some urinary dribbling after urination.  She will have no urine in underwear, but as she is sitting, she will have dribbling after she has a full stream of urination prior.    No change in vision.  03/2014 opthalmologist.  No numbness or tingling in extremities, no leg swelling.   Patient Active Problem List   Diagnosis Date Noted  . Nuclear sclerotic cataract of both eyes 04/08/2013  . Other and unspecified hyperlipidemia 04/17/2012  . Type II or unspecified type diabetes mellitus without mention of complication, uncontrolled 09/13/2011  . HTN (hypertension) 09/13/2011  . Tobacco user 09/13/2011  . DDD (degenerative disc disease), lumbar 09/13/2011  . History of rectal polypectomy 08/31/2010    Past Medical History  Diagnosis Date  . Night sweats   . Diabetes mellitus   . Hyperlipidemia   . Rectal polyp     Past Surgical History  Procedure Laterality Date  . Rectal polypectomy  05/16/2010    TVAdenoma polyp  . Back surgery       plate put in back  . Tubal ligation    . Abdominal hysterectomy  2000    Social History  Substance Use Topics  . Smoking status: Current Every Day Smoker -- 1.50 packs/day for 50 years    Types: Cigarettes  . Smokeless tobacco: None  . Alcohol Use: No    Family History  Problem Relation Age of Onset  . Diabetes Sister   . Diabetes Brother   . Hypertension Brother   . Cancer Mother 30    colon and lung  . Diabetes Mother   . Heart disease Mother   . Hyperlipidemia Mother   . Cancer Father     stomach  . Diabetes Maternal Grandmother     No Known Allergies  Medication list has been reviewed and updated.  Current Outpatient Prescriptions on File Prior to Visit  Medication Sig Dispense Refill  . Albuterol Sulfate (PROAIR RESPICLICK) 224 (90 BASE) MCG/ACT AEPB Inhale 2 Act into the lungs 4 (four) times daily as needed. 3 each 1  . aspirin 81 MG tablet Take 81 mg by mouth daily.      . Cholecalciferol (VITAMIN D3) 1000 UNITS CAPS Take 2 tablets by mouth daily.      Marland Kitchen glimepiride (AMARYL) 2 MG tablet Take 1/2 tablet twice daily with meals. 90 tablet 3  .  hydrochlorothiazide (MICROZIDE) 12.5 MG capsule Take 1 capsule by mouth  daily 90 capsule 0  . insulin detemir (LEVEMIR) 100 UNIT/ML injection Inject 0.25 mLs (25 Units total) into the skin at bedtime. 10 mL 12  . Insulin Syringe-Needle U-100 (BD INSULIN SYRINGE ULTRAFINE) 31G X 5/16" 1 ML MISC Use to inject insulin daily. Dx code: E11.65 100 each 3  . lisinopril (PRINIVIL,ZESTRIL) 10 MG tablet Take 1 tablet (10 mg total) by mouth daily. 90 tablet 3  . metFORMIN (GLUCOPHAGE) 1000 MG tablet Take 1 tablet by mouth  twice a day with a meal 180 tablet 3  . Needles & Syringes MISC Insulin needles and syringes 100 each 3  . pravastatin (PRAVACHOL) 40 MG tablet Take 1 tablet by mouth every PM after a meal. 90 tablet 3   No current facility-administered medications on file prior to visit.    ROS ROS otherwise unremarkable  unless listed above.   Physical Examination: BP 131/74 mmHg  Pulse 109  Temp(Src) 99.1 F (37.3 C)  Resp 18  Ht 5\' 7"  (1.702 m)  Wt 158 lb (71.668 kg)  BMI 24.74 kg/m2 Ideal Body Weight: Weight in (lb) to have BMI = 25: 159.3  Physical Exam  Constitutional: She is oriented to person, place, and time. She appears well-developed and well-nourished. No distress.  HENT:  Head: Normocephalic and atraumatic.  Right Ear: External ear normal.  Left Ear: External ear normal.  Eyes: Conjunctivae and EOM are normal. Pupils are equal, round, and reactive to light.  Cardiovascular: Normal rate, regular rhythm and normal heart sounds.  Exam reveals no gallop and no friction rub.   No murmur heard. Pulses:      Radial pulses are 2+ on the right side, and 0 on the left side.       Dorsalis pedis pulses are 2+ on the right side, and 2+ on the left side.       Posterior tibial pulses are 2+ on the right side, and 2+ on the left side.  Pulmonary/Chest: Effort normal. No respiratory distress. She has no decreased breath sounds. She has no wheezes.  Neurological: She is alert and oriented to person, place, and time.  Skin: She is not diaphoretic.  Psychiatric: She has a normal mood and affect. Her behavior is normal.    Results for orders placed or performed in visit on 09/21/14  COMPLETE METABOLIC PANEL WITH GFR  Result Value Ref Range   Sodium 139 135 - 146 mmol/L   Potassium 4.4 3.5 - 5.3 mmol/L   Chloride 101 98 - 110 mmol/L   CO2 25 20 - 31 mmol/L   Glucose, Bld 152 (H) 65 - 99 mg/dL   BUN 23 7 - 25 mg/dL   Creat 1.01 (H) 0.50 - 0.99 mg/dL   Total Bilirubin 0.3 0.2 - 1.2 mg/dL   Alkaline Phosphatase 104 33 - 130 U/L   AST 15 10 - 35 U/L   ALT 15 6 - 29 U/L   Total Protein 7.6 6.1 - 8.1 g/dL   Albumin 4.4 3.6 - 5.1 g/dL   Calcium 10.2 8.6 - 10.4 mg/dL   GFR, Est African American 66 >=60 mL/min   GFR, Est Non African American 57 (L) >=60 mL/min  TSH  Result Value Ref Range   TSH  1.409 0.350 - 4.500 uIU/mL  CBC  Result Value Ref Range   WBC 8.9 4.0 - 10.5 K/uL   RBC 5.33 (H) 3.87 - 5.11 MIL/uL   Hemoglobin 15.1 (  H) 12.0 - 15.0 g/dL   HCT 45.2 36.0 - 46.0 %   MCV 84.8 78.0 - 100.0 fL   MCH 28.3 26.0 - 34.0 pg   MCHC 33.4 30.0 - 36.0 g/dL   RDW 14.9 11.5 - 15.5 %   Platelets 297 150 - 400 K/uL   MPV 10.3 8.6 - 12.4 fL  Hemoglobin A1c  Result Value Ref Range   Hgb A1c MFr Bld 7.9 (H) <5.7 %   Mean Plasma Glucose 180 (H) <117 mg/dL  POCT urinalysis dipstick  Result Value Ref Range   Color, UA Yellow    Clarity, UA Clear    Glucose, UA 100    Bilirubin, UA Negative    Ketones, UA Negative    Spec Grav, UA 1.015    Blood, UA Negative    pH, UA 5.0    Protein, UA Negative    Urobilinogen, UA 0.2    Nitrite, UA Negative    Leukocytes, UA Negative Negative   Assessment and Plan: 69 year old is here today for follow up of htn and diabetes.   -I am sending another referral for this [place] of carotid.    -Thyroid will be evaluated at this time.   -Will check kidney and liver function at this time.  Shows moderate kidney impairment at this time.  Advising hydration.      -a1c trending up again.  Elevated from 7.5 to 7.9.  I am advising that she return in 3 months where this can be rechecked.  She will need to practice better dietary restrictions.  This will be given with results message.   -TDAP given -Flu vaccine given -Give pneumococcal vaccine at next visit.   Essential hypertension, benign  Type II diabetes mellitus, uncontrolled - Plan: Hemoglobin A1c  Need for Tdap vaccination - Plan: Tdap vaccine greater than or equal to 7yo IM  Need for prophylactic vaccination and inoculation against influenza - Plan: Flu Vaccine QUAD 36+ mos IM  Dizziness - Plan: US Carotid Duplex Bilateral, TSH, CBC  Tachycardia - Plan: COMPLETE METABOLIC PANEL WITH GFR, TSH, CBC  Post-void dribbling - Plan: POCT urinalysis dipstick   Ivar Drape, PA-C [facility] 09/21/2014 1:15 PM

## 2014-09-23 ENCOUNTER — Ambulatory Visit: Payer: Medicare Other | Admitting: Physician Assistant

## 2014-09-29 ENCOUNTER — Encounter: Payer: Self-pay | Admitting: Family Medicine

## 2014-12-02 ENCOUNTER — Other Ambulatory Visit: Payer: Self-pay | Admitting: Family Medicine

## 2014-12-08 ENCOUNTER — Other Ambulatory Visit: Payer: Self-pay

## 2014-12-08 MED ORDER — LISINOPRIL 10 MG PO TABS: 10.0000 mg | ORAL_TABLET | Freq: Every day | ORAL | Status: DC

## 2014-12-08 MED ORDER — PRAVASTATIN SODIUM 40 MG PO TABS: ORAL_TABLET | ORAL | Status: DC

## 2014-12-08 MED ORDER — GLIMEPIRIDE 2 MG PO TABS: ORAL_TABLET | ORAL | Status: DC

## 2014-12-08 MED ORDER — METFORMIN HCL 1000 MG PO TABS: ORAL_TABLET | ORAL | Status: DC

## 2014-12-27 ENCOUNTER — Telehealth: Payer: Self-pay

## 2014-12-27 NOTE — Telephone Encounter (Signed)
Pt stated her diabetic supplies have been delayed due to her current physician is not registered in the [place] system? She is very confused on this and would like someone to explain this to her.  Please advise  [phone]

## 2014-12-28 NOTE — Telephone Encounter (Signed)
I believe Dr. Leward Quan is on her prescriptions.

## 2014-12-30 ENCOUNTER — Ambulatory Visit: Payer: Medicare Other | Admitting: Physician Assistant

## 2014-12-30 NOTE — Telephone Encounter (Signed)
Pt called again regarding the delay in receiving her diabetic supplies due to a discrepancy with medicare involving one of our physicians not being registered in the [place] system. Pt would like a return call; she has some questions. Pt is out of her test strips and therefore needs this to be resolved ASAP. Please see previous messages

## 2014-12-31 NOTE — Telephone Encounter (Signed)
Called  Pt, mailbox is full.

## 2015-01-02 NOTE — Telephone Encounter (Signed)
Mailbox full.  Need to get where he gets his supplies from and a number.

## 2015-01-03 ENCOUNTER — Ambulatory Visit: Payer: Medicare Other | Admitting: Physician Assistant

## 2015-01-07 ENCOUNTER — Other Ambulatory Visit: Payer: Self-pay | Admitting: *Deleted

## 2015-01-07 NOTE — Telephone Encounter (Signed)
Pt called back about name and number of place where she get her supplies from,which are her test strips and lancets.  The name of the supply place is [facility] and their number is [phone].  I tried to call and they were closed.  They are opened Mon thur Fri 8-5 Central time.    Please try giving them a call next week to get her taken care of.  Please try pt at both numbers in record to let her know when this is taken care of.

## 2015-01-10 NOTE — Telephone Encounter (Signed)
I called today, they are still closed.

## 2015-01-11 NOTE — Telephone Encounter (Signed)
Spoke with supply company, they will fax another form so we can fill out. It was advised to put to my attention.

## 2015-01-17 ENCOUNTER — Ambulatory Visit: Payer: Medicare Other | Admitting: Family Medicine

## 2015-02-07 ENCOUNTER — Encounter: Payer: Self-pay | Admitting: Family Medicine

## 2015-02-07 ENCOUNTER — Ambulatory Visit (INDEPENDENT_AMBULATORY_CARE_PROVIDER_SITE_OTHER): Payer: Medicare Other | Admitting: Family Medicine

## 2015-02-07 VITALS — BP 128/78 | HR 102 | Temp 97.7°F | Resp 16 | Wt 162.2 lb

## 2015-02-07 DIAGNOSIS — E119 Type 2 diabetes mellitus without complications: Secondary | ICD-10-CM

## 2015-02-07 DIAGNOSIS — M5136 Other intervertebral disc degeneration, lumbar region: Secondary | ICD-10-CM | POA: Diagnosis not present

## 2015-02-07 DIAGNOSIS — Z23 Encounter for immunization: Secondary | ICD-10-CM

## 2015-02-07 DIAGNOSIS — R0602 Shortness of breath: Secondary | ICD-10-CM | POA: Diagnosis not present

## 2015-02-07 DIAGNOSIS — I10 Essential (primary) hypertension: Secondary | ICD-10-CM

## 2015-02-07 DIAGNOSIS — Z794 Long term (current) use of insulin: Secondary | ICD-10-CM

## 2015-02-07 DIAGNOSIS — E7800 Pure hypercholesterolemia, unspecified: Secondary | ICD-10-CM

## 2015-02-07 LAB — CBC WITH DIFFERENTIAL/PLATELET
BASOS ABS: 0 10*3/uL (ref 0.0–0.1)
BASOS PCT: 0 % (ref 0–1)
EOS ABS: 0.1 10*3/uL (ref 0.0–0.7)
Eosinophils Relative: 1 % (ref 0–5)
HCT: 43.1 % (ref 36.0–46.0)
Hemoglobin: 14.3 g/dL (ref 12.0–15.0)
Lymphocytes Relative: 44 % (ref 12–46)
Lymphs Abs: 3.4 10*3/uL (ref 0.7–4.0)
MCH: 28.3 pg (ref 26.0–34.0)
MCHC: 33.2 g/dL (ref 30.0–36.0)
MCV: 85.2 fL (ref 78.0–100.0)
MPV: 9.9 fL (ref 8.6–12.4)
Monocytes Absolute: 0.5 10*3/uL (ref 0.1–1.0)
Monocytes Relative: 6 % (ref 3–12)
NEUTROS PCT: 49 % (ref 43–77)
Neutro Abs: 3.8 10*3/uL (ref 1.7–7.7)
PLATELETS: 285 10*3/uL (ref 150–400)
RBC: 5.06 MIL/uL (ref 3.87–5.11)
RDW: 14.4 % (ref 11.5–15.5)
WBC: 7.8 10*3/uL (ref 4.0–10.5)

## 2015-02-07 LAB — LIPID PANEL
CHOLESTEROL: 149 mg/dL (ref 125–200)
HDL: 49 mg/dL (ref >=46)
LDL Cholesterol: 69 mg/dL (ref <130)
Total CHOL/HDL Ratio: 3 Ratio (ref <=5.0)
Triglycerides: 153 mg/dL — ABNORMAL HIGH (ref <150)
VLDL: 31 mg/dL — AB (ref <30)

## 2015-02-07 LAB — COMPREHENSIVE METABOLIC PANEL
ALK PHOS: 96 U/L (ref 33–130)
ALT: 13 U/L (ref 6–29)
AST: 11 U/L (ref 10–35)
Albumin: 4 g/dL (ref 3.6–5.1)
BUN: 21 mg/dL (ref 7–25)
CO2: 24 mmol/L (ref 20–31)
CREATININE: 0.97 mg/dL — AB (ref 0.60–0.93)
Calcium: 9.4 mg/dL (ref 8.6–10.4)
Chloride: 103 mmol/L (ref 98–110)
GLUCOSE: 218 mg/dL — AB (ref 65–99)
Potassium: 4.7 mmol/L (ref 3.5–5.3)
Sodium: 140 mmol/L (ref 135–146)
TOTAL PROTEIN: 6.6 g/dL (ref 6.1–8.1)
Total Bilirubin: 0.2 mg/dL (ref 0.2–1.2)

## 2015-02-07 LAB — POCT GLYCOSYLATED HEMOGLOBIN (HGB A1C): HEMOGLOBIN A1C: 8.7

## 2015-02-07 LAB — GLUCOSE, POCT (MANUAL RESULT ENTRY): POC Glucose: 231 mg/dl — AB (ref 70–99)

## 2015-02-07 MED ORDER — TIOTROPIUM BROMIDE MONOHYDRATE 18 MCG IN CAPS: 18.0000 ug | ORAL_CAPSULE | Freq: Every day | RESPIRATORY_TRACT | Status: DC

## 2015-02-07 NOTE — Progress Notes (Signed)
Subjective:    Patient ID: [name], female    DOB: [birth year], 70 y.o.   MRN: [id number]  02/07/2015  Diabetes   HPI This 70 y.o. female presents for four month follow-up:  1. DMII: ordered [facility].  Received notification that provider was not in system; patient called [facility], Medicare, and [facility].  Last week, [facility]; paperwork is here to be completed.  Has not tested sugars since 12/17/2014.  Irritated due to delay.  Will become symptomatic if gets lower than 60.  Not too worried about testing.  Six physicians are credentialed.  Tests twice daily; expects paperwork to be completed.  Leward Quan was previus PCP.  Followed by Leward Quan for several years; last visit with Leward Quan in 05/2014; saw PA in 09/2014.  Tired of talking with everyone.  First time with this problem.  Very aggravated.  Fasting sugars 100; evenings 119.   Metformin 1000mg  bid. Amaryl 2mg  1/2 bid Levemir 25units qhs.  2.  Hyperlipidemia: Patient reports good compliance with medication, good tolerance to medication, and good symptom control.   Pravastatin 40mg  qhs.  3.  HTN: Patient reports good compliance with medication, good tolerance to medication, and good symptom control.   Lisinopril 10mg  and HCTZ 12.5mg  daily.    4.  COPD/emphysema:  Prescribed Albuterol HFA PRN; started in January 2016.  Tobacco abuse.  Using Albuterol 3-4 times per day.  Smoking less than 1 ppd x 51 years.  No quitting.  Gets SOB all the time; walking up and down steps causes DOE; inhaler works some but not that much.  DOE started one year ago.Not exercising. S/p CXR in 04/2014: +hyperinflation.  Review of Systems  Constitutional: Negative for fever, chills, diaphoresis and fatigue.  Eyes: Negative for visual disturbance.  Respiratory: Negative for cough and shortness of breath.   Cardiovascular: Negative for chest pain, palpitations and leg swelling.  Gastrointestinal: Negative for nausea, vomiting,  abdominal pain, diarrhea and constipation.  Endocrine: Negative for cold intolerance, heat intolerance, polydipsia, polyphagia and polyuria.  Neurological: Negative for dizziness, tremors, seizures, syncope, facial asymmetry, speech difficulty, weakness, light-headedness, numbness and headaches.    Past Medical History  Diagnosis Date  . Night sweats   . Diabetes mellitus   . Hyperlipidemia   . Rectal polyp   . Hypertension    Past Surgical History  Procedure Laterality Date  . Rectal polypectomy  05/16/2010    TVAdenoma polyp  . Back surgery      plate put in back  . Tubal ligation    . Abdominal hysterectomy  2000   Not on File Current Outpatient Prescriptions  Medication Sig Dispense Refill  . Albuterol Sulfate (PROAIR RESPICLICK) 123XX123 (90 BASE) MCG/ACT AEPB Inhale 2 Act into the lungs 4 (four) times daily as needed. 3 each 1  . aspirin 81 MG tablet Take 81 mg by mouth daily.      . Cholecalciferol (VITAMIN D3) 1000 UNITS CAPS Take 2 tablets by mouth daily.      Marland Kitchen glimepiride (AMARYL) 2 MG tablet Take 1/2 tablet twice daily with meals. 90 tablet 0  . hydrochlorothiazide (MICROZIDE) 12.5 MG capsule Take 1 capsule by mouth  daily 90 capsule 1  . insulin detemir (LEVEMIR) 100 UNIT/ML injection Inject 0.25 mLs (25 Units total) into the skin at bedtime. 10 mL 12  . Insulin Syringe-Needle U-100 (BD INSULIN SYRINGE ULTRAFINE) 31G X 5/16" 1 ML MISC Use to inject insulin daily. Dx code: E11.65 100 each 3  .  lisinopril (PRINIVIL,ZESTRIL) 10 MG tablet Take 1 tablet (10 mg total) by mouth daily. 90 tablet 0  . metFORMIN (GLUCOPHAGE) 1000 MG tablet Take 1 tablet by mouth  twice a day with a meal 180 tablet 0  . Needles & Syringes MISC Insulin needles and syringes 100 each 3  . pravastatin (PRAVACHOL) 40 MG tablet Take 1 tablet by mouth every PM after a meal. 90 tablet 1  . tiotropium (SPIRIVA) 18 MCG inhalation capsule Place 1 capsule (18 mcg total) into inhaler and inhale daily. 90 capsule 3     No current facility-administered medications for this visit.   Social History   Social History  . Marital Status: Divorced    Spouse Name: N/A  . Number of Children: N/A  . Years of Education: N/A   Occupational History  . Not on file.   Social History Main Topics  . Smoking status: Current Every Day Smoker -- 1.50 packs/day for 50 years    Types: Cigarettes  . Smokeless tobacco: Not on file  . Alcohol Use: No  . Drug Use: No  . Sexual Activity: Not on file   Other Topics Concern  . Not on file   Social History Narrative   Marital status: divorced; not dating      Children:  3 children; 7 seven grandchildren; 1 gg      Lives: with oldest daughter      Employment: retired [employer] age 86      Tobacco; 1ppd x 50 years      Exercise: none   Family History  Problem Relation Age of Onset  . Diabetes Sister   . Diabetes Brother   . Hypertension Brother   . Cancer Mother 40    colon(age 47) and lung (age 39)  . Diabetes Mother   . Heart disease Mother 6    CABG  . Hyperlipidemia Mother   . Cancer Father 28    stomach  . Diabetes Maternal Grandmother   . Mental illness Sister        Objective:    BP 128/78 mmHg  Pulse 102  Temp(Src) 97.7 F (36.5 C) (Oral)  Resp 16  Wt 162 lb 3.2 oz (73.573 kg) Physical Exam  Constitutional: She is oriented to person, place, and time. She appears well-developed and well-nourished. No distress.  HENT:  Head: Normocephalic and atraumatic.  Right Ear: External ear normal.  Left Ear: External ear normal.  Nose: Nose normal.  Mouth/Throat: Oropharynx is clear and moist.  Eyes: Conjunctivae and EOM are normal. Pupils are equal, round, and reactive to light.  Neck: Normal range of motion. Neck supple. Carotid bruit is not present. No thyromegaly present.  Cardiovascular: Normal rate, regular rhythm, normal heart sounds and intact distal pulses.  Exam reveals no gallop and no friction rub.   No murmur  heard. Pulmonary/Chest: Effort normal and breath sounds normal. She has no wheezes. She has no rales.  Abdominal: Soft. Bowel sounds are normal. She exhibits no distension and no mass. There is no tenderness. There is no rebound and no guarding.  Lymphadenopathy:    She has no cervical adenopathy.  Neurological: She is alert and oriented to person, place, and time. No cranial nerve deficit.  Skin: Skin is warm and dry. No rash noted. She is not diaphoretic. No erythema. No pallor.  Psychiatric: She has a normal mood and affect. Her behavior is normal.   Results for orders placed or performed in visit on 02/07/15  POCT glucose (manual entry)  Result Value Ref Range   POC Glucose 231 (A) 70 - 99 mg/dl  POCT glycosylated hemoglobin (Hb A1C)  Result Value Ref Range   Hemoglobin A1C 8.7        Assessment & Plan:   1. Essential hypertension   2. DDD (degenerative disc disease), lumbar   3. Type 2 diabetes mellitus without complication, with long-term current use of insulin ([place])   4. Pure hypercholesterolemia   5. Shortness of breath     Orders Placed This Encounter  Procedures  . Pneumococcal conjugate vaccine 13-valent IM  . CBC with Differential/Platelet  . Comprehensive metabolic panel    Order Specific Question:  Has the patient fasted?    Answer:  Yes  . Lipid panel    Order Specific Question:  Has the patient fasted?    Answer:  Yes  . Microalbumin, urine  . POCT glucose (manual entry)  . POCT glycosylated hemoglobin (Hb A1C)  . EKG 12-Lead   Meds ordered this encounter  Medications  . tiotropium (SPIRIVA) 18 MCG inhalation capsule    Sig: Place 1 capsule (18 mcg total) into inhaler and inhale daily.    Dispense:  90 capsule    Refill:  3    Return in about 3 months (around 05/08/2015) for complete physical examiniation.    Kristi Elayne Guerin, M.D. [facility] [address] [place], [place]  [address] [phone] phone [phone] fax

## 2015-02-07 NOTE — Patient Instructions (Addendum)
  1. Increase Levemir to 30units at bedtime.

## 2015-02-08 LAB — MICROALBUMIN, URINE: Microalb, Ur: 0.4 mg/dL

## 2015-02-10 ENCOUNTER — Other Ambulatory Visit: Payer: Self-pay

## 2015-02-10 NOTE — Telephone Encounter (Signed)
Pt called to check the status of rx testing equipment and supplies  [phone]

## 2015-02-11 NOTE — Telephone Encounter (Signed)
Called the diabetics testing supply place and was on hold for 15 minutes.

## 2015-02-14 NOTE — Telephone Encounter (Signed)
Dr. Tamala Julian I know this is not your pt but can you please sign this form. They need a provider that is certified through Medicare. I asked for a list of the doctors here that are certified and she was unable to do this. Can we see if this will go through in your name? Form in your box.

## 2015-02-14 NOTE — Telephone Encounter (Signed)
Requests all medications refilled.  - She been working on this since December and has no su[pplies to test her blood.  Also, wants Dr. Tamala Julian to call her at home tonight.  [phone]

## 2015-02-16 ENCOUNTER — Other Ambulatory Visit: Payer: Self-pay | Admitting: Family Medicine

## 2015-02-17 ENCOUNTER — Other Ambulatory Visit: Payer: Self-pay

## 2015-02-17 MED ORDER — INSULIN DETEMIR 100 UNIT/ML ~~LOC~~ SOLN: 30.0000 [IU] | Freq: Every day | SUBCUTANEOUS | Status: DC

## 2015-02-17 MED ORDER — "INSULIN SYRINGE-NEEDLE U-100 31G X 5/16"" 1 ML MISC": Status: DC

## 2015-02-17 MED ORDER — BLOOD GLUCOSE MONITOR KIT: PACK | Status: AC

## 2015-02-18 NOTE — Telephone Encounter (Signed)
VM was full on [phone] but LM on other # below to CB. We have RFd all meds requested by [facility]. Have ? Concerning a glucose meter req'd from [facility]... Dr Tamala Julian filled out and faxed the form from the DM supply company that pt was trying to get her supplies through. Does she need the meter from [facility]?

## 2015-02-21 ENCOUNTER — Encounter: Payer: Self-pay | Admitting: Family Medicine

## 2015-02-24 ENCOUNTER — Ambulatory Visit (INDEPENDENT_AMBULATORY_CARE_PROVIDER_SITE_OTHER): Payer: Medicare Other | Admitting: Family Medicine

## 2015-02-24 VITALS — BP 132/70 | HR 102 | Temp 99.9°F | Resp 18 | Ht 67.0 in | Wt 162.0 lb

## 2015-02-24 DIAGNOSIS — K047 Periapical abscess without sinus: Secondary | ICD-10-CM

## 2015-02-24 LAB — POCT CBC
Granulocyte percent: 68.3 %G (ref 37–80)
HEMATOCRIT: 45 % (ref 37.7–47.9)
Hemoglobin: 14.8 g/dL (ref 12.2–16.2)
LYMPH, POC: 3.7 — AB (ref 0.6–3.4)
MCH, POC: 27.9 pg (ref 27–31.2)
MCHC: 33 g/dL (ref 31.8–35.4)
MCV: 84.6 fL (ref 80–97)
MID (CBC): 0.1 (ref 0–0.9)
MPV: 8.1 fL (ref 0–99.8)
POC GRANULOCYTE: 8.2 — AB (ref 2–6.9)
POC LYMPH %: 31.2 % (ref 10–50)
POC MID %: 0.5 % (ref 0–12)
Platelet Count, POC: 258 10*3/uL (ref 142–424)
RBC: 5.32 M/uL (ref 4.04–5.48)
RDW, POC: 14.9 %
WBC: 12 10*3/uL — AB (ref 4.6–10.2)

## 2015-02-24 MED ORDER — AMOXICILLIN-POT CLAVULANATE 875-125 MG PO TABS: 1.0000 | ORAL_TABLET | Freq: Two times a day (BID) | ORAL | Status: DC

## 2015-02-24 MED ORDER — HYDROCODONE-ACETAMINOPHEN 5-325 MG PO TABS: 1.0000 | ORAL_TABLET | Freq: Four times a day (QID) | ORAL | Status: DC | PRN

## 2015-02-24 MED ORDER — CEFTRIAXONE SODIUM 1 G IJ SOLR
1.0000 g | Freq: Once | INTRAMUSCULAR | Status: AC
  Administered 2015-02-24: 1 g via INTRAMUSCULAR

## 2015-02-24 NOTE — Patient Instructions (Signed)
We drained a large abscess from your upper gums today I hope that you will be feeling better soon!  Use warm compresses and warm water rinses on the area to help keep it clean and remove any pus that builds up Use the pain medication as needed but remember it can make you feel drowsy Use the antibiotic as directed I will refer you to an oral surgeon to see if they can help you under your medical insurance If you are getting worse instead of better, running a fever, feeling like you have the flu, etc please seek care right away

## 2015-02-24 NOTE — Progress Notes (Addendum)
[facility] [address] [place] [address] [phone]  Date:  02/24/2015   Name:  [name]   DOB:  [birth year]   MRN:  [id number]  PCP:  Ellsworth Lennox, MD    Chief Complaint: Facial Swelling and Sore Throat   History of Present Illness:  [name] is a 70 y.o. very pleasant female patient who presents with the following:  She is here today with pain and swelling of the left upper gums and swelling of the upper lip She has noted these sx for about 5 days - getting worse.  She has noted a lot more pain over the last 24 hours.   No vomiting She has been able eat just slowly but it is hard due to her dental pain. No difficulty swallowing or SOB  She might have felt a bit feverish last night but did not check her temp History of DM  Lab Results  Component Value Date   HGBA1C 8.7 02/07/2015     Patient Active Problem List   Diagnosis Date Noted  . Nuclear sclerotic cataract of both eyes 04/08/2013  . Other and unspecified hyperlipidemia 04/17/2012  . Type II or unspecified type diabetes mellitus without mention of complication, uncontrolled 09/13/2011  . HTN (hypertension) 09/13/2011  . Tobacco user 09/13/2011  . DDD (degenerative disc disease), lumbar 09/13/2011  . History of rectal polypectomy 08/31/2010    Past Medical History  Diagnosis Date  . Night sweats   . Diabetes mellitus   . Hyperlipidemia   . Rectal polyp   . Hypertension     Past Surgical History  Procedure Laterality Date  . Rectal polypectomy  05/16/2010    TVAdenoma polyp  . Back surgery      plate put in back  . Tubal ligation    . Abdominal hysterectomy  2000    Social History  Substance Use Topics  . Smoking status: Current Every Day Smoker -- 1.50 packs/day for 50 years    Types: Cigarettes  . Smokeless tobacco: None  . Alcohol Use: No    Family History  Problem Relation Age of Onset  . Diabetes Sister   . Diabetes Brother   . Hypertension Brother    . Cancer Mother 60    colon(age 71) and lung (age 90)  . Diabetes Mother   . Heart disease Mother 63    CABG  . Hyperlipidemia Mother   . Cancer Father 26    stomach  . Diabetes Maternal Grandmother   . Mental illness Sister     No Known Allergies  Medication list has been reviewed and updated.  Current Outpatient Prescriptions on File Prior to Visit  Medication Sig Dispense Refill  . aspirin 81 MG tablet Take 81 mg by mouth daily.      . Cholecalciferol (VITAMIN D3) 1000 UNITS CAPS Take 2 tablets by mouth daily.      Marland Kitchen glimepiride (AMARYL) 2 MG tablet TAKE 1/2 TABLET TWICE DAILY WITH MEALS. 90 tablet 0  . hydrochlorothiazide (MICROZIDE) 12.5 MG capsule Take 1 capsule by mouth  daily 90 capsule 0  . Insulin Syringe-Needle U-100 (BD INSULIN SYRINGE ULTRAFINE) 31G X 5/16" 1 ML MISC Use to inject insulin daily. Dx code: E11.65 100 each 3  . lisinopril (PRINIVIL,ZESTRIL) 10 MG tablet Take 1 tablet by mouth  daily 90 tablet 0  . metFORMIN (GLUCOPHAGE) 1000 MG tablet Take 1 tablet by mouth  twice a day with meals 180 tablet 0  .  Needles & Syringes MISC Insulin needles and syringes 100 each 3  . pravastatin (PRAVACHOL) 40 MG tablet TAKE 1 TABLET BY MOUTH  EVERY PM AFTER A MEAL. 90 tablet 0  . tiotropium (SPIRIVA) 18 MCG inhalation capsule Place 1 capsule (18 mcg total) into inhaler and inhale daily. 90 capsule 3  . Albuterol Sulfate (PROAIR RESPICLICK) 099 (90 BASE) MCG/ACT AEPB Inhale 2 Act into the lungs 4 (four) times daily as needed. (Patient not taking: Reported on 02/24/2015) 3 each 1  . blood glucose meter kit and supplies KIT Dispense based on patient and insurance preference. Use up to four times daily as directed. (FOR ICD-9 250.00, 250.01). (Patient not taking: Reported on 02/24/2015) 1 each 0  . insulin detemir (LEVEMIR) 100 UNIT/ML injection Inject 0.3 mLs (30 Units total) into the skin at bedtime. (Patient not taking: Reported on 02/24/2015) 10 mL 12   No current  facility-administered medications on file prior to visit.    Review of Systems:  As per HPI- otherwise negative.   Physical Examination: Filed Vitals:   02/24/15 1102  BP: 128/60  Pulse: 119  Temp: 99.9 F (37.7 C)  Resp: 18   Filed Vitals:   02/24/15 1102  Height: $Remove'5\' 7"'iZJauVe$  (1.702 m)  Weight: 162 lb (73.483 kg)   Body mass index is 25.37 kg/(m^2). Ideal Body Weight: Weight in (lb) to have BMI = 25: 159.3  GEN: WDWN, NAD, Non-toxic, A & O x 3, does not appear to feel well but is not acutely ill.  Puffiness of the left upper lip and left cheek HEENT: Atraumatic, Normocephalic. Neck supple. No masses, No LAD. PEERL, EOMI.   Malodorous breath and poor dentition.  She has an extremely tender fluctuant area along the buccal aspect of the left anterior upper gums c/w abscess.  This is causing some swelling of the upper lip. No angioedema, no edema of the pharynx  Ears and Nose: No external deformity. CV: RRR, No M/G/R. No JVD. No thrill. No extra heart sounds. PULM: CTA B, no wheezes, crackles, rhonchi. No retractions. No resp. distress. No accessory muscle use EXTR: No c/c/e NEURO Normal gait.  PSYCH: Normally interactive. Conversant. Not depressed or anxious appearing.  Calm demeanor.   Dental abscess- VC obtained.  Prepped area with a betadine swab. Used 11 blade to incise fluctuant area (no anesthesia used as area is very tense with pus).  Malodorous pus expressed and cultured.  Pt rinsed mouth well and spit to expel all infected material   Assessment and Plan: Dental abscess - Plan: POCT CBC, Wound culture, cefTRIAXone (ROCEPHIN) injection 1 g, HYDROcodone-acetaminophen (NORCO/VICODIN) 5-325 MG tablet, amoxicillin-clavulanate (AUGMENTIN) 875-125 MG tablet, Ambulatory referral to Oral Maxillofacial Surgery  Here today with a very painful dental abscess.  No angioedema Drained abscess, cultured Rocephin IM rx for oral augmentin and pain medication Referral to oral surgery - we  hope that she can have some of her other problematic teeth removed so she may be able to get dentures Cautioned pt about si/sx of sepsis and urged her to seek care if these develop.  However she reported feeling better already once abscess was drained and I think she will do well  Signed Lamar Blinks, MD  2/12- called to check on her and go over her wound culture.  She reports that she is overall doing much better, her pain is much improved.   However she is having diarrhea, likely from abx. She already took a pill this am.  Advised her to  cut back to a 1/2 tablet BID tomorrow and to try and get though 5 days of treatment total.  However if even this is triggering diarrhea she can just stop- her infection was an abscess so I and D was the most important element of her treatment   Results for orders placed or performed in visit on 02/24/15  Wound culture  Result Value Ref Range   Gram Stain Abundant    Gram Stain WBC present-both PMN and Mononuclear    Gram Stain Few Squamous Epithelial Cells Present    Gram Stain Abundant Gram Positive Cocci In Pairs In Clusters    Gram Stain Abundant Gram Negative Rods    Organism ID, Bacteria Multiple Organisms Present,None Predominant   POCT CBC  Result Value Ref Range   WBC 12.0 (A) 4.6 - 10.2 K/uL   Lymph, poc 3.7 (A) 0.6 - 3.4   POC LYMPH PERCENT 31.2 10 - 50 %L   MID (cbc) 0.1 0 - 0.9   POC MID % 0.5 0 - 12 %M   POC Granulocyte 8.2 (A) 2 - 6.9   Granulocyte percent 68.3 37 - 80 %G   RBC 5.32 4.04 - 5.48 M/uL   Hemoglobin 14.8 12.2 - 16.2 g/dL   HCT, POC 45.0 37.7 - 47.9 %   MCV 84.6 80 - 97 fL   MCH, POC 27.9 27 - 31.2 pg   MCHC 33.0 31.8 - 35.4 g/dL   RDW, POC 14.9 %   Platelet Count, POC 258 142 - 424 K/uL   MPV 8.1 0 - 99.8 fL

## 2015-02-26 LAB — WOUND CULTURE

## 2015-03-01 ENCOUNTER — Other Ambulatory Visit: Payer: Self-pay

## 2015-03-01 DIAGNOSIS — Z1231 Encounter for screening mammogram for malignant neoplasm of breast: Secondary | ICD-10-CM

## 2015-04-22 ENCOUNTER — Ambulatory Visit
Admission: RE | Admit: 2015-04-22 | Discharge: 2015-04-22 | Disposition: A | Discharge status: Home / Self Care | Payer: Medicare Other | Source: Ambulatory Visit

## 2015-04-22 DIAGNOSIS — Z1231 Encounter for screening mammogram for malignant neoplasm of breast: Secondary | ICD-10-CM

## 2015-05-31 ENCOUNTER — Emergency Department (HOSPITAL_COMMUNITY)
Admission: EM | Admit: 2015-05-31 | Discharge: 2015-05-31 | Disposition: A | Discharge status: Home / Self Care | Payer: Medicare Other | Attending: Emergency Medicine | Admitting: Emergency Medicine

## 2015-05-31 ENCOUNTER — Encounter: Payer: Self-pay | Admitting: Family Medicine

## 2015-05-31 ENCOUNTER — Encounter (HOSPITAL_COMMUNITY): Payer: Self-pay | Admitting: Emergency Medicine

## 2015-05-31 ENCOUNTER — Emergency Department (HOSPITAL_COMMUNITY): Payer: Medicare Other

## 2015-05-31 ENCOUNTER — Ambulatory Visit (INDEPENDENT_AMBULATORY_CARE_PROVIDER_SITE_OTHER): Payer: Medicare Other | Admitting: Family Medicine

## 2015-05-31 VITALS — BP 78/50 | HR 138 | Temp 98.2°F | Resp 16 | Ht 67.0 in | Wt 156.0 lb

## 2015-05-31 DIAGNOSIS — Z8719 Personal history of other diseases of the digestive system: Secondary | ICD-10-CM | POA: Insufficient documentation

## 2015-05-31 DIAGNOSIS — R0602 Shortness of breath: Secondary | ICD-10-CM

## 2015-05-31 DIAGNOSIS — R1031 Right lower quadrant pain: Secondary | ICD-10-CM | POA: Diagnosis not present

## 2015-05-31 DIAGNOSIS — R000 Tachycardia, unspecified: Secondary | ICD-10-CM

## 2015-05-31 DIAGNOSIS — F17210 Nicotine dependence, cigarettes, uncomplicated: Secondary | ICD-10-CM | POA: Insufficient documentation

## 2015-05-31 DIAGNOSIS — R05 Cough: Secondary | ICD-10-CM | POA: Diagnosis not present

## 2015-05-31 DIAGNOSIS — I10 Essential (primary) hypertension: Secondary | ICD-10-CM

## 2015-05-31 DIAGNOSIS — Z7982 Long term (current) use of aspirin: Secondary | ICD-10-CM | POA: Diagnosis not present

## 2015-05-31 DIAGNOSIS — Z79899 Other long term (current) drug therapy: Secondary | ICD-10-CM | POA: Diagnosis not present

## 2015-05-31 DIAGNOSIS — I959 Hypotension, unspecified: Secondary | ICD-10-CM | POA: Diagnosis not present

## 2015-05-31 DIAGNOSIS — E119 Type 2 diabetes mellitus without complications: Secondary | ICD-10-CM | POA: Insufficient documentation

## 2015-05-31 DIAGNOSIS — Z794 Long term (current) use of insulin: Secondary | ICD-10-CM | POA: Insufficient documentation

## 2015-05-31 DIAGNOSIS — E785 Hyperlipidemia, unspecified: Secondary | ICD-10-CM | POA: Diagnosis not present

## 2015-05-31 DIAGNOSIS — E7800 Pure hypercholesterolemia, unspecified: Secondary | ICD-10-CM | POA: Diagnosis not present

## 2015-05-31 DIAGNOSIS — M545 Low back pain: Secondary | ICD-10-CM | POA: Diagnosis not present

## 2015-05-31 DIAGNOSIS — Z7984 Long term (current) use of oral hypoglycemic drugs: Secondary | ICD-10-CM | POA: Insufficient documentation

## 2015-05-31 DIAGNOSIS — R1032 Left lower quadrant pain: Secondary | ICD-10-CM | POA: Diagnosis not present

## 2015-05-31 LAB — COMPREHENSIVE METABOLIC PANEL
ALT: 22 U/L (ref 14–54)
AST: 18 U/L (ref 15–41)
Albumin: 3.1 g/dL — ABNORMAL LOW (ref 3.5–5.0)
Alkaline Phosphatase: 81 U/L (ref 38–126)
Anion gap: 11 (ref 5–15)
BILIRUBIN TOTAL: 0.5 mg/dL (ref 0.3–1.2)
BUN: 26 mg/dL — AB (ref 6–20)
CALCIUM: 9 mg/dL (ref 8.9–10.3)
CO2: 22 mmol/L (ref 22–32)
CREATININE: 1.08 mg/dL — AB (ref 0.44–1.00)
Chloride: 104 mmol/L (ref 101–111)
GFR calc Af Amer: 59 mL/min — ABNORMAL LOW (ref >60)
GFR, EST NON AFRICAN AMERICAN: 51 mL/min — AB (ref >60)
Glucose, Bld: 151 mg/dL — ABNORMAL HIGH (ref 65–99)
POTASSIUM: 4.3 mmol/L (ref 3.5–5.1)
Sodium: 137 mmol/L (ref 135–145)
TOTAL PROTEIN: 6 g/dL — AB (ref 6.5–8.1)

## 2015-05-31 LAB — POCT GLYCOSYLATED HEMOGLOBIN (HGB A1C): Hemoglobin A1C: 10

## 2015-05-31 LAB — URINALYSIS, ROUTINE W REFLEX MICROSCOPIC
BILIRUBIN URINE: NEGATIVE
Glucose, UA: NEGATIVE mg/dL
HGB URINE DIPSTICK: NEGATIVE
KETONES UR: NEGATIVE mg/dL
Leukocytes, UA: NEGATIVE
Nitrite: NEGATIVE
PROTEIN: NEGATIVE mg/dL
Specific Gravity, Urine: 1.016 (ref 1.005–1.030)
pH: 5 (ref 5.0–8.0)

## 2015-05-31 LAB — POCT CBC
GRANULOCYTE PERCENT: 47.2 % (ref 37–80)
HEMATOCRIT: 43.8 % (ref 37.7–47.9)
HEMOGLOBIN: 15.1 g/dL (ref 12.2–16.2)
Lymph, poc: 4.1 — AB (ref 0.6–3.4)
MCH, POC: 28.8 pg (ref 27–31.2)
MCHC: 34.4 g/dL (ref 31.8–35.4)
MCV: 83.8 fL (ref 80–97)
MID (cbc): 0.5 (ref 0–0.9)
MPV: 7.5 fL (ref 0–99.8)
PLATELET COUNT, POC: 255 10*3/uL (ref 142–424)
POC GRANULOCYTE: 4.2 (ref 2–6.9)
POC LYMPH %: 47 % (ref 10–50)
POC MID %: 5.8 %M (ref 0–12)
RBC: 5.23 M/uL (ref 4.04–5.48)
RDW, POC: 14.4 %
WBC: 8.8 10*3/uL (ref 4.6–10.2)

## 2015-05-31 LAB — CBC
HEMATOCRIT: 39.8 % (ref 36.0–46.0)
Hemoglobin: 12.7 g/dL (ref 12.0–15.0)
MCH: 27.3 pg (ref 26.0–34.0)
MCHC: 31.9 g/dL (ref 30.0–36.0)
MCV: 85.4 fL (ref 78.0–100.0)
PLATELETS: 219 10*3/uL (ref 150–400)
RBC: 4.66 MIL/uL (ref 3.87–5.11)
RDW: 14.5 % (ref 11.5–15.5)
WBC: 6.7 10*3/uL (ref 4.0–10.5)

## 2015-05-31 LAB — GLUCOSE, POCT (MANUAL RESULT ENTRY): POC Glucose: 246 mg/dl — AB (ref 70–99)

## 2015-05-31 MED ORDER — IOPAMIDOL (ISOVUE-300) INJECTION 61%
100.0000 mL | Freq: Once | INTRAVENOUS | Status: AC | PRN
  Administered 2015-05-31: 100 mL via INTRAVENOUS

## 2015-05-31 MED ORDER — SODIUM CHLORIDE 0.9 % IV BOLUS (SEPSIS)
1000.0000 mL | Freq: Once | INTRAVENOUS | Status: AC
  Administered 2015-05-31: 1000 mL via INTRAVENOUS

## 2015-05-31 NOTE — ED Notes (Signed)
Patient transported to X-ray 

## 2015-05-31 NOTE — ED Notes (Signed)
Pt st's she was at MD's office for diabetic check up and MD found pt to be tachycardic and hypotension.  Pt denies any discomforts or complaints at this time.

## 2015-05-31 NOTE — Progress Notes (Signed)
Subjective:    Patient ID: [name], female    DOB: 20-Jun-1945, 70 y.o.   MRN: [id number]  05/31/2015  Diabetes   HPI This 70 y.o. female presents for four month follow-up:   1.  SOB/COPD: worsening; needs to quit smoking; feels will be dead soon; using Spiriva at 11:00am; rescue inhaler uses as needed.  Not doing daily.   2.  HTN: does not check BP at home; low today; Lisinopril '10mg'$  daily; HCTZ 12.'5mg'$  daily.  3. DMII: sugar this morning 225 fasting.  Not running well.  Took at 1:00pm; 230.  Glimperide '2mg'$  1/2 bid with meals.  Levemir 30units every night.  Metformin '1000mg'$  bid.  Lowest sugar was 98.    4.  Hypercholesterolemia: Patient reports good compliance with medication, good tolerance to medication, and good symptom control.     Review of Systems  Constitutional: Negative for fever, chills, diaphoresis and fatigue.  Eyes: Negative for visual disturbance.  Respiratory: Positive for shortness of breath. Negative for cough.   Cardiovascular: Negative for chest pain, palpitations and leg swelling.  Gastrointestinal: Negative for nausea, vomiting, abdominal pain, diarrhea and constipation.  Endocrine: Negative for cold intolerance, heat intolerance, polydipsia, polyphagia and polyuria.  Neurological: Positive for dizziness and light-headedness. Negative for tremors, seizures, syncope, facial asymmetry, speech difficulty, weakness, numbness and headaches.    Past Medical History  Diagnosis Date  . Night sweats   . Diabetes mellitus   . Hyperlipidemia   . Rectal polyp   . Hypertension    Past Surgical History  Procedure Laterality Date  . Rectal polypectomy  05/16/2010    TVAdenoma polyp  . Back surgery      plate put in back  . Tubal ligation    . Abdominal hysterectomy  2000   No Known Allergies Current Outpatient Prescriptions  Medication Sig Dispense Refill  . Albuterol Sulfate (PROAIR RESPICLICK) 865 (90 BASE) MCG/ACT AEPB Inhale 2 Act into the lungs 4 (four)  times daily as needed. (Patient taking differently: Inhale 2 puffs into the lungs 4 (four) times daily as needed (for shortness of breath or wheezing). ) 3 each 1  . aspirin 81 MG tablet Take 81 mg by mouth daily.      . blood glucose meter kit and supplies KIT Dispense based on patient and insurance preference. Use up to four times daily as directed. (FOR ICD-9 250.00, 250.01). 1 each 0  . Cholecalciferol (VITAMIN D3) 1000 UNITS CAPS Take 2 capsules by mouth every morning.     . insulin detemir (LEVEMIR) 100 UNIT/ML injection Inject 0.3 mLs (30 Units total) into the skin at bedtime. 10 mL 12  . Insulin Syringe-Needle U-100 (BD INSULIN SYRINGE ULTRAFINE) 31G X 5/16" 1 ML MISC Use to inject insulin daily. Dx code: E11.65 100 each 3  . Needles & Syringes MISC Insulin needles and syringes 100 each 3  . tiotropium (SPIRIVA) 18 MCG inhalation capsule Place 1 capsule (18 mcg total) into inhaler and inhale daily. 90 capsule 3  . glimepiride (AMARYL) 2 MG tablet Take one-half tablet by  mouth twice a day with  meals 90 tablet 3  . hydrochlorothiazide (MICROZIDE) 12.5 MG capsule Take 1 capsule by mouth  daily 90 capsule 3  . lisinopril (PRINIVIL,ZESTRIL) 10 MG tablet Take 1 tablet by mouth  daily 90 tablet 3  . metFORMIN (GLUCOPHAGE) 1000 MG tablet Take 1 tablet by mouth  twice a day with meals 180 tablet 3  . pravastatin (PRAVACHOL) 40 MG tablet Take  1 tablet by mouth  every evening after a meal 90 tablet 3   No current facility-administered medications for this visit.   Social History   Social History  . Marital Status: Divorced    Spouse Name: N/A  . Number of Children: N/A  . Years of Education: N/A   Occupational History  . Not on file.   Social History Main Topics  . Smoking status: Current Every Day Smoker -- 1.50 packs/day for 50 years    Types: Cigarettes  . Smokeless tobacco: Not on file  . Alcohol Use: No  . Drug Use: No  . Sexual Activity: Not on file   Other Topics Concern  .  Not on file   Social History Narrative   Marital status: divorced; not dating      Children:  3 children; 7 seven grandchildren; 1 gg      Lives: with oldest daughter      Employment: retired [employer] age 19      Tobacco; 1ppd x 50 years      Exercise: none   Family History  Problem Relation Age of Onset  . Diabetes Sister   . Diabetes Brother   . Hypertension Brother   . Cancer Mother 26    colon(age 58) and lung (age 63)  . Diabetes Mother   . Heart disease Mother 28    CABG  . Hyperlipidemia Mother   . Cancer Father 58    stomach  . Diabetes Maternal Grandmother   . Mental illness Sister        Objective:    BP 78/50 mmHg  Pulse 138  Temp(Src) 98.2 F (36.8 C) (Oral)  Resp 16  Ht '5\' 7"'$  (1.702 m)  Wt 156 lb (70.761 kg)  BMI 24.43 kg/m2  SpO2 98% Physical Exam  Constitutional: She is oriented to person, place, and time. She appears well-developed and well-nourished. No distress.  HENT:  Head: Normocephalic and atraumatic.  Right Ear: External ear normal.  Left Ear: External ear normal.  Nose: Nose normal.  Mouth/Throat: Oropharynx is clear and moist.  Eyes: Conjunctivae and EOM are normal. Pupils are equal, round, and reactive to light.  Neck: Normal range of motion. Neck supple. Carotid bruit is not present. No thyromegaly present.  Cardiovascular: Normal rate, regular rhythm, normal heart sounds and intact distal pulses.  Exam reveals no gallop and no friction rub.   No murmur heard. Pulmonary/Chest: Effort normal and breath sounds normal. She has no wheezes. She has no rales.  Abdominal: Soft. Bowel sounds are normal. She exhibits no distension and no mass. There is no tenderness. There is no rebound and no guarding.  Lymphadenopathy:    She has no cervical adenopathy.  Neurological: She is alert and oriented to person, place, and time. No cranial nerve deficit.  Skin: Skin is warm and dry. No rash noted. She is not diaphoretic. No erythema. No  pallor.  Psychiatric: She has a normal mood and affect. Her behavior is normal.   Results for orders placed or performed in visit on 05/31/15  Comprehensive metabolic panel  Result Value Ref Range   Sodium 134 (L) 135 - 146 mmol/L   Potassium 5.0 3.5 - 5.3 mmol/L   Chloride 100 98 - 110 mmol/L   CO2 18 (L) 20 - 31 mmol/L   Glucose, Bld 222 (H) 65 - 99 mg/dL   BUN 29 (H) 7 - 25 mg/dL   Creat 1.30 (H) 0.60 - 0.93 mg/dL  Total Bilirubin 0.4 0.2 - 1.2 mg/dL   Alkaline Phosphatase 99 33 - 130 U/L   AST 16 10 - 35 U/L   ALT 23 6 - 29 U/L   Total Protein 7.0 6.1 - 8.1 g/dL   Albumin 4.2 3.6 - 5.1 g/dL   Calcium 9.7 8.6 - 10.4 mg/dL  TSH  Result Value Ref Range   TSH 0.03 (L) mIU/L  POCT glucose (manual entry)  Result Value Ref Range   POC Glucose 246 (A) 70 - 99 mg/dl  POCT glycosylated hemoglobin (Hb A1C)  Result Value Ref Range   Hemoglobin A1C 10   POCT CBC  Result Value Ref Range   WBC 8.8 4.6 - 10.2 K/uL   Lymph, poc 4.1 (A) 0.6 - 3.4   POC LYMPH PERCENT 47.0 10 - 50 %L   MID (cbc) 0.5 0 - 0.9   POC MID % 5.8 0 - 12 %M   POC Granulocyte 4.2 2 - 6.9   Granulocyte percent 47.2 37 - 80 %G   RBC 5.23 4.04 - 5.48 M/uL   Hemoglobin 15.1 12.2 - 16.2 g/dL   HCT, POC 43.8 37.7 - 47.9 %   MCV 83.8 80 - 97 fL   MCH, POC 28.8 27 - 31.2 pg   MCHC 34.4 31.8 - 35.4 g/dL   RDW, POC 14.4 %   Platelet Count, POC 255 142 - 424 K/uL   MPV 7.5 0 - 99.8 fL       Assessment & Plan:   1. Hypotension, unspecified hypotension type   2. Shortness of breath   3. Tachycardia with 121 - 140 beats per minute   4. Essential hypertension   5. Type 2 diabetes mellitus without complication, with long-term current use of insulin ([place])   6. Pure hypercholesterolemia    -to ED for further evaluation.  Tachycardia with hypotension in office with unknown etiology. -pt transported via EMS.  Orders Placed This Encounter  Procedures  . Comprehensive metabolic panel  . TSH  . POCT glucose  (manual entry)  . POCT glycosylated hemoglobin (Hb A1C)  . POCT CBC  . POCT urinalysis dipstick  . EKG 12-Lead   No orders of the defined types were placed in this encounter.    No Follow-up on file.    Juliet Vasbinder Elayne Guerin, M.D. [facility] [address] [place], [place]  [address] [phone] phone [phone] fax

## 2015-05-31 NOTE — Discharge Instructions (Signed)
Ms. [name],  Nice meeting you! Please follow-up with your primary care provider. Return to the emergency department if you develop chest pain, shortness of breath, dizziness, weakness, new/worsening symptoms. Feel better soon!  [name], PA-C   Hypotension As your heart beats, it forces blood through your arteries. This force is your blood pressure. If your blood pressure is too low for you to go about your normal activities or to support the organs of your body, you have hypotension. Hypotension is also referred to as low blood pressure. When your blood pressure becomes too low, you may not get enough blood to your brain. As a result, you may feel weak, feel lightheaded, or develop a rapid heart rate. In a more severe case, you may faint. CAUSES Various conditions can cause hypotension. These include:  Blood loss.  Dehydration.  Heart or endocrine problems.  Pregnancy.  Severe infection.  Not having a well-balanced diet filled with needed nutrients.  Severe allergic reactions (anaphylaxis). Some medicines, such as blood pressure medicine or water pills (diuretics), may lower your blood pressure below normal. Sometimes taking too much medicine or taking medicine not as directed can cause hypotension. TREATMENT  Hospitalization is sometimes required for hypotension if fluid or blood replacement is needed, if time is needed for medicines to wear off, or if further monitoring is needed. Treatment might include changing your diet, changing your medicines (including medicines aimed at raising your blood pressure), and use of support stockings. HOME CARE INSTRUCTIONS   Drink enough fluids to keep your urine clear or pale yellow.  Take your medicines as directed by your health care provider.  Get up slowly from reclining or sitting positions. This gives your blood pressure a chance to adjust.  Wear support stockings as directed by your health care provider.  Maintain a  healthy diet by including nutritious food, such as fruits, vegetables, nuts, whole grains, and lean meats. SEEK MEDICAL CARE IF:  You have vomiting or diarrhea.  You have a fever for more than 2-3 days.  You feel more thirsty than usual.  You feel weak and tired. SEEK IMMEDIATE MEDICAL CARE IF:   You have chest pain or a fast or irregular heartbeat.  You have a loss of feeling in some part of your body, or you lose movement in your arms or legs.  You have trouble speaking.  You become sweaty or feel lightheaded.  You faint. MAKE SURE YOU:   Understand these instructions.  Will watch your condition.  Will get help right away if you are not doing well or get worse.   This information is not intended to replace advice given to you by your health care provider. Make sure you discuss any questions you have with your health care provider.   Document Released: 01/01/2005 Document Revised: 10/22/2012 Document Reviewed: 07/04/2012 Elsevier Interactive Patient Education [employer].

## 2015-05-31 NOTE — Patient Instructions (Signed)
     IF you received an x-ray today, you will receive an invoice from [facility]. Please contact [facility] at [phone] with questions or concerns regarding your invoice.   IF you received labwork today, you will receive an invoice from [facility]. Please contact [facility] at [phone] with questions or concerns regarding your invoice.   Our billing staff will not be able to assist you with questions regarding bills from these companies.  You will be contacted with the lab results as soon as they are available. The fastest way to get your results is to activate your My Chart account. Instructions are located on the last page of this paperwork. If you have not heard from us regarding the results in 2 weeks, please contact this office.      

## 2015-05-31 NOTE — ED Notes (Signed)
Pt to  ED via [facility] with c/o back and hip pain.  Pt sent here from Urgent [facility] due to tachycardia and hypotension

## 2015-06-01 ENCOUNTER — Encounter: Payer: Self-pay | Admitting: Family Medicine

## 2015-06-01 LAB — COMPREHENSIVE METABOLIC PANEL
ALBUMIN: 4.2 g/dL (ref 3.6–5.1)
ALT: 23 U/L (ref 6–29)
AST: 16 U/L (ref 10–35)
Alkaline Phosphatase: 99 U/L (ref 33–130)
BILIRUBIN TOTAL: 0.4 mg/dL (ref 0.2–1.2)
BUN: 29 mg/dL — AB (ref 7–25)
CHLORIDE: 100 mmol/L (ref 98–110)
CO2: 18 mmol/L — AB (ref 20–31)
CREATININE: 1.3 mg/dL — AB (ref 0.60–0.93)
Calcium: 9.7 mg/dL (ref 8.6–10.4)
GLUCOSE: 222 mg/dL — AB (ref 65–99)
Potassium: 5 mmol/L (ref 3.5–5.3)
SODIUM: 134 mmol/L — AB (ref 135–146)
Total Protein: 7 g/dL (ref 6.1–8.1)

## 2015-06-01 LAB — TSH: TSH: 0.03 mIU/L — ABNORMAL LOW

## 2015-06-03 ENCOUNTER — Other Ambulatory Visit: Payer: Self-pay | Admitting: Family Medicine

## 2015-06-07 ENCOUNTER — Ambulatory Visit (INDEPENDENT_AMBULATORY_CARE_PROVIDER_SITE_OTHER): Payer: Medicare Other | Admitting: Family Medicine

## 2015-06-07 VITALS — BP 116/72 | HR 107 | Temp 98.2°F | Resp 18 | Ht 67.0 in | Wt 157.0 lb

## 2015-06-07 DIAGNOSIS — I959 Hypotension, unspecified: Secondary | ICD-10-CM

## 2015-06-07 DIAGNOSIS — F17210 Nicotine dependence, cigarettes, uncomplicated: Secondary | ICD-10-CM

## 2015-06-07 DIAGNOSIS — R0602 Shortness of breath: Secondary | ICD-10-CM

## 2015-06-07 NOTE — Progress Notes (Signed)
   Subjective:    Patient ID: [name], female    DOB: [birth year], 70 y.o.   MRN: [id number]  HPI This is a 70 yo female who presents today for follow up of ED visit from last week (05/31/15). She had been at [facility] for a visit with her PCP, Dr. Tamala Julian, and was found to be hypotensive and tachycardic. She had chest and abdominal CT. No meds were changed. She reports she is feeling well. Her blood sugar was 132 last night. Blood sugars were high while off metformin following contrast (300s), but came down when she restarted metformin. Is smoking less than 1/2 pack per day, knows she needs to quit. Feels like she is doing better by at least reducing amount.   Past Medical History  Diagnosis Date  . Night sweats   . Diabetes mellitus   . Hyperlipidemia   . Rectal polyp   . Hypertension    Past Surgical History  Procedure Laterality Date  . Rectal polypectomy  05/16/2010    TVAdenoma polyp  . Back surgery      plate put in back  . Tubal ligation    . Abdominal hysterectomy  2000   Family History  Problem Relation Age of Onset  . Diabetes Sister   . Diabetes Brother   . Hypertension Brother   . Cancer Mother 90    colon(age 85) and lung (age 53)  . Diabetes Mother   . Heart disease Mother 12    CABG  . Hyperlipidemia Mother   . Cancer Father 93    stomach  . Diabetes Maternal Grandmother   . Mental illness Sister    Social History  Substance Use Topics  . Smoking status: Current Every Day Smoker -- 1.50 packs/day for 50 years    Types: Cigarettes  . Smokeless tobacco: None  . Alcohol Use: No     Review of Systems No chest pain, breathing stable, no falls/unsteadiness, no headaches or dizziness.     Objective:   Physical Exam Physical Exam  Constitutional: Oriented to person, place, and time. She appears well-developed and well-nourished.  HENT:  Head: Normocephalic and atraumatic.  Eyes: Conjunctivae are normal.  Neck: Normal range of motion. Neck supple.    Cardiovascular: Normal rate, regular rhythm and normal heart sounds.   Pulmonary/Chest: Effort normal and breath sounds normal.  Musculoskeletal: Normal range of motion.  Neurological: Alert and oriented to person, place, and time.  Skin: Skin is warm and dry.  Psychiatric: Normal mood and affect. Behavior is normal. Judgment and thought content normal.  Vitals reviewed.     BP 116/72 mmHg  Pulse 107  Temp(Src) 98.2 F (36.8 C) (Oral)  Resp 18  Ht 5\' 7"  (1.702 m)  Wt 157 lb (71.215 kg)  BMI 24.58 kg/m2  SpO2 94% Wt Readings from Last 3 Encounters:  06/07/15 157 lb (71.215 kg)  05/31/15 156 lb (70.761 kg)  05/31/15 156 lb (70.761 kg)       Assessment & Plan:  1. Hypotension, unspecified hypotension type - this is resolved, BP 116/72 today  2. Shortness of breath - COPD stable on current regimen of Spiriva  3. Cigarette smoker two packs a day or less - encouraged her to continue to decrease the amount smoking  - follow up with Dr. Tamala Julian in 3 months, sooner if any worsening of symptoms.  Clarene Reamer, FNP-BC  [facility]  06/07/2015 9:13 AM

## 2015-06-07 NOTE — Patient Instructions (Signed)
     IF you received an x-ray today, you will receive an invoice from [facility]. Please contact [facility] at [phone] with questions or concerns regarding your invoice.   IF you received labwork today, you will receive an invoice from [facility]. Please contact [facility] at [phone] with questions or concerns regarding your invoice.   Our billing staff will not be able to assist you with questions regarding bills from these companies.  You will be contacted with the lab results as soon as they are available. The fastest way to get your results is to activate your My Chart account. Instructions are located on the last page of this paperwork. If you have not heard from us regarding the results in 2 weeks, please contact this office.      

## 2015-06-08 NOTE — ED Provider Notes (Signed)
CSN: [id number]     Arrival date & time 05/31/15  1642 History   First MD Initiated Contact with Patient 05/31/15 1801     Chief Complaint  Patient presents with  . Back Pain   HPI  [name] is a 70 y.o. female PMH significant for DM, HLD, HTN sent via [facility] from her PCP's office for tachycardia and hypotension. It is documented that she was complaining of back and hip pain, but she is denying this currently, and states she "feels fine." She denies any fevers, chills, CP, SOB, back pain, N/V, abdominal pain.   Past Medical History  Diagnosis Date  . Night sweats   . Diabetes mellitus   . Hyperlipidemia   . Rectal polyp   . Hypertension    Past Surgical History  Procedure Laterality Date  . Rectal polypectomy  05/16/2010    TVAdenoma polyp  . Back surgery      plate put in back  . Tubal ligation    . Abdominal hysterectomy  2000   Family History  Problem Relation Age of Onset  . Diabetes Sister   . Diabetes Brother   . Hypertension Brother   . Cancer Mother 65    colon(age 31) and lung (age 43)  . Diabetes Mother   . Heart disease Mother 78    CABG  . Hyperlipidemia Mother   . Cancer Father 40    stomach  . Diabetes Maternal Grandmother   . Mental illness Sister    Social History  Substance Use Topics  . Smoking status: Current Every Day Smoker -- 1.50 packs/day for 50 years    Types: Cigarettes  . Smokeless tobacco: None  . Alcohol Use: No   OB History    No data available     Review of Systems  Ten systems are reviewed and are negative for acute change except as noted in the HPI  Allergies  Review of patient's allergies indicates no known allergies.  Home Medications   Prior to Admission medications   Medication Sig Start Date End Date Taking? Authorizing Provider  Albuterol Sulfate (PROAIR RESPICLICK) 220 (90 BASE) MCG/ACT AEPB Inhale 2 Act into the lungs 4 (four) times daily as needed. Patient taking differently: Inhale 2 puffs into the lungs 4  (four) times daily as needed (for shortness of breath or wheezing).  05/12/14  Yes Barton Fanny, MD  aspirin 81 MG tablet Take 81 mg by mouth daily.     Yes Historical Provider, MD  blood glucose meter kit and supplies KIT Dispense based on patient and insurance preference. Use up to four times daily as directed. (FOR ICD-9 250.00, 250.01). 02/17/15  Yes Wardell Honour, MD  Cholecalciferol (VITAMIN D3) 1000 UNITS CAPS Take 2 capsules by mouth every morning.    Yes Historical Provider, MD  insulin detemir (LEVEMIR) 100 UNIT/ML injection Inject 0.3 mLs (30 Units total) into the skin at bedtime. 02/17/15  Yes Wardell Honour, MD  Insulin Syringe-Needle U-100 (BD INSULIN SYRINGE ULTRAFINE) 31G X 5/16" 1 ML MISC Use to inject insulin daily. Dx code: E11.65 02/17/15  Yes Wardell Honour, MD  Needles & Syringes MISC Insulin needles and syringes 01/05/14  Yes Barton Fanny, MD  tiotropium [facility]) 18 MCG inhalation capsule Place 1 capsule (18 mcg total) into inhaler and inhale daily. 02/07/15  Yes Wardell Honour, MD  glimepiride (AMARYL) 2 MG tablet Take one-half tablet by  mouth twice a day with  meals 06/04/15  Wardell Honour, MD  hydrochlorothiazide (MICROZIDE) 12.5 MG capsule Take 1 capsule by mouth  daily 06/04/15   Wardell Honour, MD  lisinopril (PRINIVIL,ZESTRIL) 10 MG tablet Take 1 tablet by mouth  daily 06/04/15   Wardell Honour, MD  metFORMIN (GLUCOPHAGE) 1000 MG tablet Take 1 tablet by mouth  twice a day with meals 06/04/15   Wardell Honour, MD  pravastatin (PRAVACHOL) 40 MG tablet Take 1 tablet by mouth  every evening after a meal 06/04/15   Wardell Honour, MD   BP 113/63 mmHg  Pulse 98  Temp(Src) 98.1 F (36.7 C) (Oral)  Resp 22  Ht '5\' 7"'$  (1.702 m)  Wt 70.761 kg  BMI 24.43 kg/m2  SpO2 100% Physical Exam  Constitutional: She appears well-developed and well-nourished. No distress.  HENT:  Head: Normocephalic and atraumatic.  Mouth/Throat: Oropharynx is clear and moist. No oropharyngeal  exudate.  Eyes: Conjunctivae are normal. Pupils are equal, round, and reactive to light. Right eye exhibits no discharge. Left eye exhibits no discharge. No scleral icterus.  Neck: No tracheal deviation present.  Cardiovascular: Normal heart sounds and intact distal pulses.  Exam reveals no gallop and no friction rub.   No murmur heard. Tachycardia  Pulmonary/Chest: Effort normal and breath sounds normal. No respiratory distress. She has no wheezes. She has no rales. She exhibits no tenderness.  Abdominal: Soft. Bowel sounds are normal. She exhibits no distension and no mass. There is tenderness. There is no rebound and no guarding.  BL lower quadrant tenderness. No pulsating mass  Musculoskeletal: She exhibits no edema.  Lymphadenopathy:    She has no cervical adenopathy.  Neurological: She is alert. Coordination normal.  Skin: Skin is warm and dry. No rash noted. She is not diaphoretic. No erythema.  Psychiatric: She has a normal mood and affect. Her behavior is normal.  Nursing note and vitals reviewed.   ED Course  Procedures  Labs Review Labs Reviewed  COMPREHENSIVE METABOLIC PANEL - Abnormal; Notable for the following:    Glucose, Bld 151 (*)    BUN 26 (*)    Creatinine, Ser 1.08 (*)    Total Protein 6.0 (*)    Albumin 3.1 (*)    GFR calc non Af Amer 51 (*)    GFR calc Af Amer 59 (*)    All other components within normal limits  CBC  URINALYSIS, ROUTINE W REFLEX MICROSCOPIC (NOT AT [facility])    Imaging Review Dg Chest 2 View  05/31/2015  CLINICAL DATA:  Chronic cough EXAM: CHEST  2 VIEW COMPARISON:  05/12/2014 FINDINGS: Cardiac shadow is within normal limits. The lungs are again hyperinflated consistent with COPD. Mild thoracic spine degenerative change is noted. No focal infiltrate is seen. IMPRESSION: COPD without acute abnormality. Electronically Signed   By: Inez Catalina M.D.   On: 05/31/2015 20:14   Ct Abdomen Pelvis W Contrast  05/31/2015  CLINICAL DATA:  Seen at  urgent care for hip and back pain, pt. was hypotensive and tachycardia, hx hysterectomy, back surgery, COPD, bilateral lower abdominal pain. EXAM: CT ABDOMEN AND PELVIS WITH CONTRAST TECHNIQUE: Multidetector CT imaging of the abdomen and pelvis was performed using the standard protocol following bolus administration of intravenous contrast. CONTRAST:  144m ISOVUE-300 IOPAMIDOL (ISOVUE-300) INJECTION 61% COMPARISON:  None. FINDINGS: Dependent atelectasis in the lung bases. Calcification in the aortic valve. Mild diffuse fatty infiltration of the liver. Cholelithiasis without inflammatory changes. No bile duct dilatation. The pancreas, spleen, adrenal glands, kidneys, abdominal aorta,  inferior vena cava, and retroperitoneal lymph nodes are unremarkable. Stomach, small bowel, and colon are not abnormally distended. No free air or free fluid in the abdomen. Abdominal wall musculature appears intact. Pelvis: The appendix is normal. Uterus is surgically absent. Small simple appearing cyst on the right ovary measuring 2.6 cm diameter. Based on size, no follow-up is indicated. No bladder wall thickening. No free or loculated pelvic fluid collections. No pelvic lymphadenopathy. Postoperative changes with anterior fixation of L4-L5. No destructive bone lesions. IMPRESSION: Cholelithiasis. Diffuse fatty infiltration of the liver. Benign-appearing cyst in the right ovary. Electronically Signed   By: Lucienne Capers M.D.   On: 05/31/2015 22:51   I have personally reviewed and evaluated these images and lab results as part of my medical decision-making.  MDM   Final diagnoses:  Hypotension, unspecified hypotension type  Tachycardia   Patient with soft BPs and tachycardia ranging 102-138. Per prior record review, the tachycardia appears baseline. She is asymptomatic but was tender on exam in BL lower quadrants. Discussed case with Dr. Audie Pinto. Due to patient's new hypotension and abdominal tenderness, will CT  abdomen/pelvis to eval for AAA. Less likely PE, infectious etiology. Orthostatics, UA, CMP, CBC unremarkable for acute change. CT abdomen/pelvis unremarkable.  Patient received 1 L fluids, hypotension improved. She is requesting to leave, and I feel she is stable to do so. Patient may be safely discharged home. Discussed reasons for return. Patient to follow-up with primary care provider within one week. Patient in understanding and agreement with the plan.  Discussed case with Dr. Audie Pinto who agrees with above plan.   Lynn Lions, PA-C 06/08/15 1151  Leonard Schwartz, MD 06/10/15 1025

## 2015-06-29 ENCOUNTER — Ambulatory Visit (INDEPENDENT_AMBULATORY_CARE_PROVIDER_SITE_OTHER): Payer: Medicare Other | Admitting: Family Medicine

## 2015-06-29 DIAGNOSIS — R7989 Other specified abnormal findings of blood chemistry: Secondary | ICD-10-CM

## 2015-06-29 DIAGNOSIS — R946 Abnormal results of thyroid function studies: Secondary | ICD-10-CM

## 2015-06-29 LAB — TSH: TSH: 0.72 m[IU]/L

## 2015-06-29 LAB — T3, FREE: T3 FREE: 2.8 pg/mL (ref 2.3–4.2)

## 2015-06-29 LAB — T4, FREE: Free T4: 1.3 ng/dL (ref 0.8–1.8)

## 2015-07-15 NOTE — Progress Notes (Signed)
No provider encounter; lab visit only.

## 2015-08-31 ENCOUNTER — Ambulatory Visit (INDEPENDENT_AMBULATORY_CARE_PROVIDER_SITE_OTHER): Payer: Medicare Other | Admitting: Family Medicine

## 2015-08-31 ENCOUNTER — Encounter: Payer: Self-pay | Admitting: Family Medicine

## 2015-08-31 VITALS — BP 122/80 | HR 115 | Temp 98.3°F | Resp 18 | Ht 67.0 in | Wt 161.0 lb

## 2015-08-31 DIAGNOSIS — R946 Abnormal results of thyroid function studies: Secondary | ICD-10-CM

## 2015-08-31 DIAGNOSIS — I10 Essential (primary) hypertension: Secondary | ICD-10-CM | POA: Diagnosis not present

## 2015-08-31 DIAGNOSIS — Z720 Tobacco use: Secondary | ICD-10-CM

## 2015-08-31 DIAGNOSIS — M5136 Other intervertebral disc degeneration, lumbar region: Secondary | ICD-10-CM | POA: Diagnosis not present

## 2015-08-31 DIAGNOSIS — Z794 Long term (current) use of insulin: Secondary | ICD-10-CM

## 2015-08-31 DIAGNOSIS — Z8719 Personal history of other diseases of the digestive system: Secondary | ICD-10-CM | POA: Diagnosis not present

## 2015-08-31 DIAGNOSIS — E119 Type 2 diabetes mellitus without complications: Secondary | ICD-10-CM | POA: Diagnosis not present

## 2015-08-31 DIAGNOSIS — Z9889 Other specified postprocedural states: Secondary | ICD-10-CM | POA: Diagnosis not present

## 2015-08-31 DIAGNOSIS — R7989 Other specified abnormal findings of blood chemistry: Secondary | ICD-10-CM

## 2015-08-31 LAB — COMPREHENSIVE METABOLIC PANEL
ALK PHOS: 95 U/L (ref 33–130)
ALT: 12 U/L (ref 6–29)
AST: 13 U/L (ref 10–35)
Albumin: 4 g/dL (ref 3.6–5.1)
BILIRUBIN TOTAL: 0.3 mg/dL (ref 0.2–1.2)
BUN: 14 mg/dL (ref 7–25)
CALCIUM: 9.3 mg/dL (ref 8.6–10.4)
CO2: 22 mmol/L (ref 20–31)
Chloride: 106 mmol/L (ref 98–110)
Creat: 1.14 mg/dL — ABNORMAL HIGH (ref 0.60–0.93)
GLUCOSE: 177 mg/dL — AB (ref 65–99)
POTASSIUM: 4.5 mmol/L (ref 3.5–5.3)
Sodium: 138 mmol/L (ref 135–146)
TOTAL PROTEIN: 6.6 g/dL (ref 6.1–8.1)

## 2015-08-31 LAB — CBC WITH DIFFERENTIAL/PLATELET
BASOS ABS: 0 {cells}/uL (ref 0–200)
BASOS PCT: 0 %
EOS ABS: 78 {cells}/uL (ref 15–500)
Eosinophils Relative: 1 %
HEMATOCRIT: 43.4 % (ref 35.0–45.0)
Hemoglobin: 14.7 g/dL (ref 11.7–15.5)
LYMPHS PCT: 44 %
Lymphs Abs: 3432 cells/uL (ref 850–3900)
MCH: 28.5 pg (ref 27.0–33.0)
MCHC: 33.9 g/dL (ref 32.0–36.0)
MCV: 84.1 fL (ref 80.0–100.0)
MONO ABS: 468 {cells}/uL (ref 200–950)
MPV: 10.4 fL (ref 7.5–12.5)
Monocytes Relative: 6 %
NEUTROS PCT: 49 %
Neutro Abs: 3822 cells/uL (ref 1500–7800)
Platelets: 277 10*3/uL (ref 140–400)
RBC: 5.16 MIL/uL — AB (ref 3.80–5.10)
RDW: 14.6 % (ref 11.0–15.0)
WBC: 7.8 10*3/uL (ref 3.8–10.8)

## 2015-08-31 LAB — TSH: TSH: 0.74 m[IU]/L

## 2015-08-31 LAB — T4, FREE: Free T4: 1.3 ng/dL (ref 0.8–1.8)

## 2015-08-31 LAB — POCT GLYCOSYLATED HEMOGLOBIN (HGB A1C): Hemoglobin A1C: 9.3

## 2015-08-31 LAB — GLUCOSE, POCT (MANUAL RESULT ENTRY): POC GLUCOSE: 184 mg/dL — AB (ref 70–99)

## 2015-08-31 MED ORDER — ALBUTEROL SULFATE 108 (90 BASE) MCG/ACT IN AEPB: 2.0000 | INHALATION_SPRAY | Freq: Four times a day (QID) | RESPIRATORY_TRACT | 1 refills | Status: DC | PRN

## 2015-08-31 MED ORDER — GLIMEPIRIDE 2 MG PO TABS: ORAL_TABLET | ORAL | 3 refills | Status: DC

## 2015-08-31 MED ORDER — PRAVASTATIN SODIUM 40 MG PO TABS: ORAL_TABLET | ORAL | 3 refills | Status: DC

## 2015-08-31 MED ORDER — TRIAMCINOLONE 0.1 % CREAM:EUCERIN CREAM 1:1: 1.0000 "application " | TOPICAL_CREAM | Freq: Two times a day (BID) | CUTANEOUS | 0 refills | Status: DC | PRN

## 2015-08-31 MED ORDER — INSULIN DETEMIR 100 UNIT/ML ~~LOC~~ SOLN: 30.0000 [IU] | Freq: Every day | SUBCUTANEOUS | 12 refills | Status: DC

## 2015-08-31 MED ORDER — TIOTROPIUM BROMIDE MONOHYDRATE 18 MCG IN CAPS: 18.0000 ug | ORAL_CAPSULE | Freq: Every day | RESPIRATORY_TRACT | 3 refills | Status: DC

## 2015-08-31 MED ORDER — METFORMIN HCL 1000 MG PO TABS: 1000.0000 mg | ORAL_TABLET | Freq: Two times a day (BID) | ORAL | 3 refills | Status: DC

## 2015-08-31 MED ORDER — LISINOPRIL 10 MG PO TABS: 10.0000 mg | ORAL_TABLET | Freq: Every day | ORAL | 3 refills | Status: DC

## 2015-08-31 NOTE — Progress Notes (Signed)
Subjective:    Patient ID: [name], female    DOB: April 11, 1945, 70 y.o.   MRN: [id number]  08/31/2015  Follow-up (4 month on diabetes)   HPI This 70 y.o. female presents for three month follow-up:  1. DMII: Patient reports good compliance with medication, good tolerance to medication, and good symptom control.  Sugar 106 this morning.  Ate eggs, bacon, coffee this morning.  Metformin 1000mg  bid, Glimeperide 1/2 bid, Levemir 30 units qhs.  259 one afternoon.    2.  HTN: does not check BP at home.  Taking HCTZ 12.5mg  daily, Lisinopril 10mg  daily.  130/something. No lightheaded or dizziness.  No passing out.    3.  Hypercholesterolemia: Patient reports good compliance with medication, good tolerance to medication, and good symptom control.  Pravastatin 40mg  daily.  4.  COPD; Patient reports good compliance with medication, good tolerance to medication, and good symptom control.  Taking Spiriva at 11:00am; taking Albuterol PRN; once weekly.  Albuterol helps some.  5.  Itching: L foot, L thing, R buttocks, upper back.  Always itching.  Dry skin?  Even when showers, uses lotion.  Vaseline.  Skin is so dry.  Several months.  Uses Zest body wash or Zest bar.  Uses cheapest lotion possible.      Review of Systems  Constitutional: Negative for chills, diaphoresis, fatigue and fever.  Eyes: Negative for visual disturbance.  Respiratory: Negative for cough and shortness of breath.   Cardiovascular: Negative for chest pain, palpitations and leg swelling.  Gastrointestinal: Negative for abdominal pain, constipation, diarrhea, nausea and vomiting.  Endocrine: Negative for cold intolerance, heat intolerance, polydipsia, polyphagia and polyuria.  Skin: Positive for rash.  Neurological: Negative for dizziness, tremors, seizures, syncope, facial asymmetry, speech difficulty, weakness, light-headedness, numbness and headaches.    Past Medical History:  Diagnosis Date  . Diabetes mellitus   .  Hyperlipidemia   . Hypertension   . Night sweats   . Rectal polyp    Past Surgical History:  Procedure Laterality Date  . ABDOMINAL HYSTERECTOMY  2000  . BACK SURGERY     plate put in back  . 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 TUBAL LIGATION     No Known Allergies  Social History   Social History  . Marital status: Divorced    Spouse name: N/A  . Number of children: N/A  . Years of education: N/A   Occupational History  . Not on file.   Social History Main Topics  . Smoking status: Current Every Day Smoker    Packs/day: 1.50    Years: 50.00    Types: Cigarettes  . Smokeless tobacco: Never Used  . Alcohol use No  . Drug use: No  . Sexual activity: Not on file   Other Topics Concern  . Not on file   Social History Narrative   Marital status: divorced; not dating      Children:  3 children; 7 seven grandchildren; 1 gg      Lives: with oldest daughter      Employment: retired [employer] [place] age 82      Tobacco; 1ppd x 50 years      Exercise: none   Family History  Problem Relation Age of Onset  . Diabetes Sister   . Diabetes Brother   . Hypertension Brother   . Cancer Mother 42    colon(age 62) and lung (age 37)  . Diabetes Mother   . Heart disease  Mother 59    CABG  . Hyperlipidemia Mother   . Cancer Father 80    stomach  . Diabetes Maternal Grandmother   . Mental illness Sister        Objective:    BP 122/80 (BP Location: Right Arm, Patient Position: Sitting, Cuff Size: Small)   Pulse (!) 115   Temp 98.3 F (36.8 C) (Oral)   Resp 18   Ht 5\' 7"  (1.702 m)   Wt 161 lb (73 kg)   SpO2 96%   BMI 25.22 kg/m  Physical Exam  Constitutional: She is oriented to person, place, and time. She appears well-developed and well-nourished. No distress.  HENT:  Head: Normocephalic and atraumatic.  Right Ear: External ear normal.  Left Ear: External ear normal.  Nose: Nose normal.  Mouth/Throat: Oropharynx is clear and moist.  Eyes:  Conjunctivae and EOM are normal. Pupils are equal, round, and reactive to light.  Neck: Normal range of motion. Neck supple. Carotid bruit is not present. No thyromegaly present.  Cardiovascular: Normal rate, regular rhythm, normal heart sounds and intact distal pulses.  Exam reveals no gallop and no friction rub.   No murmur heard. Pulmonary/Chest: Effort normal and breath sounds normal. She has no wheezes. She has no rales.  Abdominal: Soft. Bowel sounds are normal. She exhibits no distension and no mass. There is no tenderness. There is no rebound and no guarding.  Lymphadenopathy:    She has no cervical adenopathy.  Neurological: She is alert and oriented to person, place, and time. No cranial nerve deficit.  Skin: Skin is warm and dry. No rash noted. She is not diaphoretic. No erythema. No pallor.  Psychiatric: She has a normal mood and affect. Her behavior is normal.        Assessment & Plan:   1. Essential hypertension   2. DDD (degenerative disc disease), lumbar   3. History of rectal polypectomy   4. Tobacco user   5. Type 2 diabetes mellitus without complication, with long-term current use of insulin ([place])   6. Abnormal thyroid blood test    -discontinue diuretic. Concern that dizziness and tachycardia due to dehydration and over-correction of blood pressure. -rx for Triamcinolone cream with eucerin provided.  Orders Placed This Encounter  Procedures  . CBC with Differential/Platelet  . Comprehensive metabolic panel  . TSH  . T4, free  . POCT glucose (manual entry)  . POCT glycosylated hemoglobin (Hb A1C)   Meds ordered this encounter  Medications  . Triamcinolone Acetonide (TRIAMCINOLONE 0.1 % CREAM : EUCERIN) CREA    Sig: Apply 1 application topically 2 (two) times daily as needed.    Dispense:  1 each    Refill:  0  . tiotropium (SPIRIVA) 18 MCG inhalation capsule    Sig: Place 1 capsule (18 mcg total) into inhaler and inhale daily.    Dispense:  90 capsule     Refill:  3  . pravastatin (PRAVACHOL) 40 MG tablet    Sig: Take 1 tablet by mouth  every evening after a meal    Dispense:  90 tablet    Refill:  3  . metFORMIN (GLUCOPHAGE) 1000 MG tablet    Sig: Take 1 tablet (1,000 mg total) by mouth 2 (two) times daily with a meal.    Dispense:  180 tablet    Refill:  3  . lisinopril (PRINIVIL,ZESTRIL) 10 MG tablet    Sig: Take 1 tablet (10 mg total) by mouth daily.  Dispense:  90 tablet    Refill:  3  . insulin detemir (LEVEMIR) 100 UNIT/ML injection    Sig: Inject 0.3 mLs (30 Units total) into the skin at bedtime.    Dispense:  10 mL    Refill:  12    Dispense: QS 3 months  . DISCONTD: glimepiride (AMARYL) 2 MG tablet    Sig: Take one-half tablet by  mouth twice a day with  meals    Dispense:  90 tablet    Refill:  3  . Albuterol Sulfate (PROAIR RESPICLICK) 123XX123 (90 Base) MCG/ACT AEPB    Sig: Inhale 2 Act into the lungs 4 (four) times daily as needed.    Dispense:  3 each    Refill:  1  . glimepiride (AMARYL) 2 MG tablet    Sig: Take one tablet by  mouth twice a day with  meals    Dispense:  180 tablet    Refill:  3    No Follow-up on file.   Allyah Heather Elayne Guerin, M.D. [facility] [address] [place],   [phone] phone [phone] fax

## 2015-08-31 NOTE — Patient Instructions (Signed)
     IF you received an x-ray today, you will receive an invoice from [facility]. Please contact [facility] at [phone] with questions or concerns regarding your invoice.   IF you received labwork today, you will receive an invoice from [facility]. Please contact [facility] at [phone] with questions or concerns regarding your invoice.   Our billing staff will not be able to assist you with questions regarding bills from these companies.  You will be contacted with the lab results as soon as they are available. The fastest way to get your results is to activate your My Chart account. Instructions are located on the last page of this paperwork. If you have not heard from us regarding the results in 2 weeks, please contact this office.      

## 2015-09-29 DIAGNOSIS — Z23 Encounter for immunization: Secondary | ICD-10-CM | POA: Diagnosis not present

## 2015-10-06 ENCOUNTER — Telehealth: Payer: Self-pay

## 2015-10-06 NOTE — Telephone Encounter (Signed)
Msg is for [name], pt was seen on 08/31/15 and was advised to stop taking the BP/fluid pill. Pt realized when she stopped taking, her feet are starting to swell up.  Please advise  [phone]

## 2015-10-10 NOTE — Telephone Encounter (Signed)
I recommend patient take the HCTZ every other day.

## 2015-10-10 NOTE — Telephone Encounter (Signed)
Attempted to call patient. No answer no voicemail.

## 2015-10-11 ENCOUNTER — Telehealth: Payer: Self-pay

## 2015-10-11 NOTE — Telephone Encounter (Signed)
error 

## 2015-10-17 MED ORDER — GLIMEPIRIDE 2 MG PO TABS: ORAL_TABLET | ORAL | 1 refills | Status: DC

## 2015-10-17 NOTE — Telephone Encounter (Signed)
I would like to see her in 3 months since her diabetes is not under good control.

## 2015-10-17 NOTE — Telephone Encounter (Signed)
Spoke to pt and she reported that she started to take the HCTZ again after calling us and her swelling is better. I gave her Dr Thompson Caul instr's to try 1 tab QOD. Pt agreed to try this. Also asked me to send in an updated Rx for glimepiride which Dr Tamala Julian increased to a whole tab BID. Updated AVS med list at end of last OV shows the new sig for glimepiride so I will send it in.  Dr Tamala Julian, pt thought you told her to f/up in 6 mos, but your OV note says 3 mos. Please advise when you would like to see her back so I can advise her.

## 2015-10-18 NOTE — Telephone Encounter (Signed)
Called and advised pt of 3 mos f/up. She reported that she is going out of town the middle of Nov for probably a full month, but doesn't know date of return. Pt stated she will call and come in for f/up when she gets back to [place] sometime the middle of Dec.

## 2015-10-25 ENCOUNTER — Ambulatory Visit: Payer: Medicare Other | Admitting: Family Medicine

## 2016-02-28 ENCOUNTER — Encounter: Payer: Self-pay | Admitting: Family Medicine

## 2016-02-28 ENCOUNTER — Ambulatory Visit (INDEPENDENT_AMBULATORY_CARE_PROVIDER_SITE_OTHER): Payer: Medicare Other | Admitting: Family Medicine

## 2016-02-28 VITALS — BP 148/92 | HR 100 | Temp 98.2°F | Resp 16 | Ht 67.0 in | Wt 165.0 lb

## 2016-02-28 DIAGNOSIS — I10 Essential (primary) hypertension: Secondary | ICD-10-CM

## 2016-02-28 DIAGNOSIS — J441 Chronic obstructive pulmonary disease with (acute) exacerbation: Secondary | ICD-10-CM

## 2016-02-28 DIAGNOSIS — E7800 Pure hypercholesterolemia, unspecified: Secondary | ICD-10-CM

## 2016-02-28 DIAGNOSIS — Z794 Long term (current) use of insulin: Secondary | ICD-10-CM | POA: Diagnosis not present

## 2016-02-28 DIAGNOSIS — E119 Type 2 diabetes mellitus without complications: Secondary | ICD-10-CM

## 2016-02-28 DIAGNOSIS — M5136 Other intervertebral disc degeneration, lumbar region: Secondary | ICD-10-CM

## 2016-02-28 LAB — POCT URINALYSIS DIP (MANUAL ENTRY)
Bilirubin, UA: NEGATIVE
Ketones, POC UA: NEGATIVE
Leukocytes, UA: NEGATIVE
NITRITE UA: NEGATIVE
PROTEIN UA: NEGATIVE
RBC UA: NEGATIVE
Spec Grav, UA: 1.01
UROBILINOGEN UA: 0.2
pH, UA: 5.5

## 2016-02-28 LAB — POCT GLYCOSYLATED HEMOGLOBIN (HGB A1C): HEMOGLOBIN A1C: 10.4

## 2016-02-28 LAB — GLUCOSE, POCT (MANUAL RESULT ENTRY): POC Glucose: 343 mg/dl — AB (ref 70–99)

## 2016-02-28 MED ORDER — FLUTICASONE FUROATE-VILANTEROL 100-25 MCG/INH IN AEPB: 1.0000 | INHALATION_SPRAY | Freq: Every day | RESPIRATORY_TRACT | 2 refills | Status: DC

## 2016-02-28 MED ORDER — INSULIN GLARGINE 100 UNIT/ML SOLOSTAR PEN: 40.0000 [IU] | PEN_INJECTOR | Freq: Every day | SUBCUTANEOUS | 11 refills | Status: DC

## 2016-02-28 MED ORDER — NEEDLES & SYRINGES MISC: 1 refills | Status: DC

## 2016-02-28 MED ORDER — METOPROLOL SUCCINATE ER 25 MG PO TB24: 25.0000 mg | ORAL_TABLET | Freq: Every day | ORAL | 3 refills | Status: DC

## 2016-02-28 NOTE — Patient Instructions (Addendum)
Increase Levemir to 40 units at bedtime. Start BREO inhaler 1 puff daily for COPD Start Metoprolol ER 25mg  one tablet daily for fast heart rate.    IF you received an x-ray today, you will receive an invoice from [facility]. Please contact [facility] at [phone] with questions or concerns regarding your invoice.   IF you received labwork today, you will receive an invoice from [facility]. Please contact [facility] at [phone] with questions or concerns regarding your invoice.   Our billing staff will not be able to assist you with questions regarding bills from these companies.  You will be contacted with the lab results as soon as they are available. The fastest way to get your results is to activate your My Chart account. Instructions are located on the last page of this paperwork. If you have not heard from [place] regarding the results in 2 weeks, please contact this office.

## 2016-02-28 NOTE — Progress Notes (Signed)
Subjective:    Patient ID: [name], female    DOB: 02-18-1945, 71 y.o.   MRN: [id number]  02/28/2016  Follow-up (6 moth/ BP)   HPI This 71 y.o. female presents for six month follow-up DMII, hypertension, hypercholesterolemia.  Discontinued HCTZ at last visit due to concern of dehydration causing dizziness.  Scheduled to see ophthalmology on 04/25/16.  Also to schedule mammogram in April 2018.   Has been taking HCTZ qod due to recurrent swelling.   Taking Spiriva; ran out of Albuterol.  Tobacco intake continues; a lot less; smoking 1/2 ppd to 1 ppd.   Checking sugars three times per day.  This morning fasting 222.  Afternoon readings 200-400 at 2:00pm; lunch at skips; not hungry most of the time; knows needs to eat.  Does eat breakfast to take foods.  Eating whatever I can get my hands on.   Levemir 30units qhs. Amaryl 2mg  bid. Metformin 1000mg  bid.   Cross stitch.    Cannot afford to move out on own.  Gets SOB with COPD; huffs and puffs to climb stairs to go to kitchen.  Emotionally not always good.  Has helped family for years and now receives little help from daughter with whom she lives.  Immunization History  Administered Date(s) Administered  . Influenza Split 09/28/2011, 09/15/2012, 09/23/2013  . Influenza,inj,Quad PF,36+ Mos 09/21/2014  . Influenza-Unspecified 09/29/2015  . Pneumococcal Conjugate-13 02/07/2015  . Pneumococcal Polysaccharide-23 12/20/2011  . Tdap 09/21/2014  . Zoster 04/17/2012   BP Readings from Last 3 Encounters:  02/28/16 (!) 148/92  08/31/15 122/80  06/07/15 116/72   Wt Readings from Last 3 Encounters:  02/28/16 165 lb (74.8 kg)  08/31/15 161 lb (73 kg)  06/07/15 157 lb (71.2 kg)    Review of Systems  Constitutional: Negative for chills, diaphoresis, fatigue and fever.  Eyes: Negative for visual disturbance.  Respiratory: Positive for shortness of breath and wheezing. Negative for cough.   Cardiovascular: Negative for chest pain, palpitations  and leg swelling.  Gastrointestinal: Negative for abdominal pain, constipation, diarrhea, nausea and vomiting.  Endocrine: Negative for cold intolerance, heat intolerance, polydipsia, polyphagia and polyuria.  Neurological: Negative for dizziness, tremors, seizures, syncope, facial asymmetry, speech difficulty, weakness, light-headedness, numbness and headaches.  Psychiatric/Behavioral: Positive for dysphoric mood. Negative for self-injury, sleep disturbance and suicidal ideas. The patient is not nervous/anxious.     Past Medical History:  Diagnosis Date  . Diabetes mellitus   . Hyperlipidemia   . Hypertension   . Night sweats   . Rectal polyp    Past Surgical History:  Procedure Laterality Date  . ABDOMINAL HYSTERECTOMY  2000  . BACK SURGERY     plate put in back  . 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 TUBAL LIGATION     No Known Allergies  Social History   Social History  . Marital status: Divorced    Spouse name: N/A  . Number of children: 3  . Years of education: N/A   Occupational History  . retired     [employer] History Main Topics  . Smoking status: Current Every Day Smoker    Packs/day: 1.50    Years: 50.00    Types: Cigarettes  . Smokeless tobacco: Never Used  . Alcohol use No  . Drug use: No  . Sexual activity: Not on file   Other Topics Concern  . Not on file   Social History Narrative   Marital status: divorced;  not dating      Children:  3 children; 7 seven grandchildren; 1 gg      Lives: with oldest daughter, 2 grandchildren, 55 gg, 1 friend of daughters, daughter's boyfriend.       Employment: retired [employer] age 69      Tobacco; 1ppd x 50 years      Exercise: none      ADLs; independent with ADLs; no transportation in 2018.     Family History  Problem Relation Age of Onset  . Diabetes Sister   . Diabetes Brother   . Hypertension Brother   . Cancer Mother 63    colon(age 68) and lung (age 68)  .  Diabetes Mother   . Heart disease Mother 67    CABG  . Hyperlipidemia Mother   . Cancer Father 25    stomach  . Diabetes Maternal Grandmother   . Mental illness Sister        Objective:    BP (!) 148/92   Pulse 100   Temp 98.2 F (36.8 C) (Oral)   Resp 16   Ht 5\' 7"  (1.702 m)   Wt 165 lb (74.8 kg)   SpO2 95%   BMI 25.84 kg/m  Physical Exam  Constitutional: She is oriented to person, place, and time. She appears well-developed and well-nourished. No distress.  HENT:  Head: Normocephalic and atraumatic.  Right Ear: External ear normal.  Left Ear: External ear normal.  Nose: Nose normal.  Mouth/Throat: Oropharynx is clear and moist.  Eyes: Conjunctivae and EOM are normal. Pupils are equal, round, and reactive to light.  Neck: Normal range of motion. Neck supple. Carotid bruit is not present. No thyromegaly present.  Cardiovascular: Normal rate, regular rhythm, normal heart sounds and intact distal pulses.  Exam reveals no gallop and no friction rub.   No murmur heard. Pulmonary/Chest: Effort normal and breath sounds normal. She has no wheezes. She has no rales.  Abdominal: Soft. Bowel sounds are normal. She exhibits no distension and no mass. There is no tenderness. There is no rebound and no guarding.  Lymphadenopathy:    She has no cervical adenopathy.  Neurological: She is alert and oriented to person, place, and time. No cranial nerve deficit.  Skin: Skin is warm and dry. No rash noted. She is not diaphoretic. No erythema. No pallor.  Psychiatric: She has a normal mood and affect. Her behavior is normal.   Results for orders placed or performed in visit on 02/28/16  Comprehensive metabolic panel  Result Value Ref Range   Glucose 311 (H) 65 - 99 mg/dL   BUN 14 8 - 27 mg/dL   Creatinine, Ser 0.93 0.57 - 1.00 mg/dL   GFR calc non Af Amer 62 >59 mL/min/1.73   GFR calc Af Amer 72 >59 mL/min/1.73   BUN/Creatinine Ratio 15 12 - 28   Sodium 138 134 - 144 mmol/L    Potassium 5.0 3.5 - 5.2 mmol/L   Chloride 97 96 - 106 mmol/L   CO2 20 18 - 29 mmol/L   Calcium 9.6 8.7 - 10.3 mg/dL   Total Protein 6.9 6.0 - 8.5 g/dL   Albumin 4.1 3.5 - 4.8 g/dL   Globulin, Total 2.8 1.5 - 4.5 g/dL   Albumin/Globulin Ratio 1.5 1.2 - 2.2   Bilirubin Total <0.2 0.0 - 1.2 mg/dL   Alkaline Phosphatase 117 39 - 117 IU/L   AST 11 0 - 40 IU/L   ALT 13 0 - 32  IU/L  Lipid panel  Result Value Ref Range   Cholesterol, Total 161 100 - 199 mg/dL   Triglycerides 237 (H) 0 - 149 mg/dL   HDL 46 >39 mg/dL   VLDL Cholesterol Cal 47 (H) 5 - 40 mg/dL   LDL Calculated 68 0 - 99 mg/dL   Chol/HDL Ratio 3.5 0.0 - 4.4 ratio units  CBC with Differential/Platelet  Result Value Ref Range   WBC 7.2 3.4 - 10.8 x10E3/uL   RBC 5.27 3.77 - 5.28 x10E6/uL   Hemoglobin 14.7 11.1 - 15.9 g/dL   Hematocrit 44.2 34.0 - 46.6 %   MCV 84 79 - 97 fL   MCH 27.9 26.6 - 33.0 pg   MCHC 33.3 31.5 - 35.7 g/dL   RDW 15.2 12.3 - 15.4 %   Platelets 270 150 - 379 x10E3/uL   Neutrophils 47 Not Estab. %   Lymphs 45 Not Estab. %   Monocytes 8 Not Estab. %   Eos 0 Not Estab. %   Basos 0 Not Estab. %   Neutrophils Absolute 3.4 1.4 - 7.0 x10E3/uL   Lymphocytes Absolute 3.2 (H) 0.7 - 3.1 x10E3/uL   Monocytes Absolute 0.6 0.1 - 0.9 x10E3/uL   EOS (ABSOLUTE) 0.0 0.0 - 0.4 x10E3/uL   Basophils Absolute 0.0 0.0 - 0.2 x10E3/uL   Immature Granulocytes 0 Not Estab. %   Immature Grans (Abs) 0.0 0.0 - 0.1 x10E3/uL  Microalbumin, urine  Result Value Ref Range   Albumin, Urine 24.7 Not Estab. ug/mL  POCT glycosylated hemoglobin (Hb A1C)  Result Value Ref Range   Hemoglobin A1C 10.4   POCT glucose (manual entry)  Result Value Ref Range   POC Glucose 343 (A) 70 - 99 mg/dl  POCT urinalysis dipstick  Result Value Ref Range   Color, UA yellow yellow   Clarity, UA clear clear   Glucose, UA =500 (A) negative   Bilirubin, UA negative negative   Ketones, POC UA negative negative   Spec Grav, UA 1.010    Blood, UA  negative negative   pH, UA 5.5    Protein Ur, POC negative negative   Urobilinogen, UA 0.2    Nitrite, UA Negative Negative   Leukocytes, UA Negative Negative   Depression screen [facility] 2/9 02/28/2016 08/31/2015 06/07/2015 05/31/2015 02/24/2015  Decreased Interest 0 0 0 0 0  Down, Depressed, Hopeless 0 0 0 0 0  PHQ - 2 Score 0 0 0 0 0   Fall Risk  02/28/2016 08/31/2015 06/07/2015 05/31/2015 02/07/2015  Falls in the past year? No No No No No       Assessment & Plan:   1. Type 2 diabetes mellitus without complication, with long-term current use of insulin ([place])   2. Essential hypertension   3. DDD (degenerative disc disease), lumbar   4. Pure hypercholesterolemia   5. COPD exacerbation (HCC)    -uncontrolled DMII: increase Lantus to 40 units daily; non-compliance with dietary modification. -worsening DOE due to COPD; has decreased tobacco intake yet continues to smoke. -obtain labs. -counseling provided regarding family stressors; coping moderately well.  -rx for Metoprolol ER for tachycardia and HTN. -start BREO inhaler for COPD.   Orders Placed This Encounter  Procedures  . Comprehensive metabolic panel    Order Specific Question:   Has the patient fasted?    Answer:   Yes  . Lipid panel    Order Specific Question:   Has the patient fasted?    Answer:   Yes  . CBC  with Differential/Platelet  . Microalbumin, urine  . POCT glycosylated hemoglobin (Hb A1C)  . POCT glucose (manual entry)  . POCT urinalysis dipstick   Meds ordered this encounter  Medications  . Insulin Glargine (LANTUS SOLOSTAR) 100 UNIT/ML Solostar Pen    Sig: Inject 40 Units into the skin daily at 10 pm.    Dispense:  15 mL    Refill:  11    DISPENSE: 3 MONTH SUPPLY  . Needles & Syringes MISC    Sig: Insulin needles and syringes    Dispense:  100 each    Refill:  1  . metoprolol succinate (TOPROL-XL) 25 MG 24 hr tablet    Sig: Take 1 tablet (25 mg total) by mouth daily.    Dispense:  90 tablet    Refill:   3  . fluticasone furoate-vilanterol (BREO ELLIPTA) 100-25 MCG/INH AEPB    Sig: Inhale 1 puff into the lungs daily.    Dispense:  90 each    Refill:  2    Return in about 3 months (around 05/27/2016) for complete physical examiniation.   Chauntae Hults Elayne Guerin, M.D. Primary Care at [facility] [address] [place], [place]  [address] [phone] phone [phone] fax

## 2016-02-29 LAB — CBC WITH DIFFERENTIAL/PLATELET
BASOS ABS: 0 10*3/uL (ref 0.0–0.2)
Basos: 0 %
EOS (ABSOLUTE): 0 10*3/uL (ref 0.0–0.4)
Eos: 0 %
HEMOGLOBIN: 14.7 g/dL (ref 11.1–15.9)
Hematocrit: 44.2 % (ref 34.0–46.6)
Immature Grans (Abs): 0 10*3/uL (ref 0.0–0.1)
Immature Granulocytes: 0 %
LYMPHS ABS: 3.2 10*3/uL — AB (ref 0.7–3.1)
LYMPHS: 45 %
MCH: 27.9 pg (ref 26.6–33.0)
MCHC: 33.3 g/dL (ref 31.5–35.7)
MCV: 84 fL (ref 79–97)
MONOCYTES: 8 %
Monocytes Absolute: 0.6 10*3/uL (ref 0.1–0.9)
Neutrophils Absolute: 3.4 10*3/uL (ref 1.4–7.0)
Neutrophils: 47 %
PLATELETS: 270 10*3/uL (ref 150–379)
RBC: 5.27 x10E6/uL (ref 3.77–5.28)
RDW: 15.2 % (ref 12.3–15.4)
WBC: 7.2 10*3/uL (ref 3.4–10.8)

## 2016-02-29 LAB — COMPREHENSIVE METABOLIC PANEL
A/G RATIO: 1.5 (ref 1.2–2.2)
ALBUMIN: 4.1 g/dL (ref 3.5–4.8)
ALT: 13 IU/L (ref 0–32)
AST: 11 IU/L (ref 0–40)
Alkaline Phosphatase: 117 IU/L (ref 39–117)
BUN/Creatinine Ratio: 15 (ref 12–28)
BUN: 14 mg/dL (ref 8–27)
Bilirubin Total: <0.2 mg/dL (ref 0.0–1.2)
CALCIUM: 9.6 mg/dL (ref 8.7–10.3)
CO2: 20 mmol/L (ref 18–29)
CREATININE: 0.93 mg/dL (ref 0.57–1.00)
Chloride: 97 mmol/L (ref 96–106)
GFR, EST AFRICAN AMERICAN: 72 mL/min/{1.73_m2} (ref >59)
GFR, EST NON AFRICAN AMERICAN: 62 mL/min/{1.73_m2} (ref >59)
GLOBULIN, TOTAL: 2.8 g/dL (ref 1.5–4.5)
Glucose: 311 mg/dL — ABNORMAL HIGH (ref 65–99)
POTASSIUM: 5 mmol/L (ref 3.5–5.2)
SODIUM: 138 mmol/L (ref 134–144)
TOTAL PROTEIN: 6.9 g/dL (ref 6.0–8.5)

## 2016-02-29 LAB — MICROALBUMIN, URINE: MICROALBUM., U, RANDOM: 24.7 ug/mL

## 2016-02-29 LAB — LIPID PANEL
CHOL/HDL RATIO: 3.5 ratio (ref 0.0–4.4)
Cholesterol, Total: 161 mg/dL (ref 100–199)
HDL: 46 mg/dL (ref >39)
LDL CALC: 68 mg/dL (ref 0–99)
Triglycerides: 237 mg/dL — ABNORMAL HIGH (ref 0–149)
VLDL Cholesterol Cal: 47 mg/dL — ABNORMAL HIGH (ref 5–40)

## 2016-03-07 ENCOUNTER — Other Ambulatory Visit: Payer: Self-pay

## 2016-03-07 MED ORDER — "INSULIN SYRINGE-NEEDLE U-100 31G X 5/16"" 1 ML MISC": 3 refills | Status: DC

## 2016-03-16 ENCOUNTER — Other Ambulatory Visit: Payer: Self-pay | Admitting: Family Medicine

## 2016-03-19 ENCOUNTER — Other Ambulatory Visit: Payer: Self-pay | Admitting: Family Medicine

## 2016-03-19 DIAGNOSIS — Z1231 Encounter for screening mammogram for malignant neoplasm of breast: Secondary | ICD-10-CM

## 2016-04-23 ENCOUNTER — Ambulatory Visit
Admission: RE | Admit: 2016-04-23 | Discharge: 2016-04-23 | Disposition: A | Discharge status: Home / Self Care | Payer: Medicare Other | Source: Ambulatory Visit | Attending: Family Medicine | Admitting: Family Medicine

## 2016-04-23 DIAGNOSIS — Z1231 Encounter for screening mammogram for malignant neoplasm of breast: Secondary | ICD-10-CM | POA: Diagnosis not present

## 2016-04-25 LAB — HM DIABETES EYE EXAM

## 2016-04-27 DIAGNOSIS — S42295A Other nondisplaced fracture of upper end of left humerus, initial encounter for closed fracture: Secondary | ICD-10-CM | POA: Diagnosis not present

## 2016-04-27 DIAGNOSIS — S42292A Other displaced fracture of upper end of left humerus, initial encounter for closed fracture: Secondary | ICD-10-CM | POA: Diagnosis not present

## 2016-05-02 ENCOUNTER — Telehealth: Payer: Self-pay | Admitting: Family Medicine

## 2016-05-02 DIAGNOSIS — S42295D Other nondisplaced fracture of upper end of left humerus, subsequent encounter for fracture with routine healing: Secondary | ICD-10-CM | POA: Diagnosis not present

## 2016-05-02 NOTE — Telephone Encounter (Signed)
Pt's daughter brought in paperwork for Dr. Tamala Julian to fill out and fax. Fax [phone]. I have placed paperwork in nurse's box at 102.

## 2016-05-05 NOTE — Telephone Encounter (Signed)
Placed in smiths box

## 2016-05-30 DIAGNOSIS — S42295D Other nondisplaced fracture of upper end of left humerus, subsequent encounter for fracture with routine healing: Secondary | ICD-10-CM | POA: Diagnosis not present

## 2016-06-06 ENCOUNTER — Ambulatory Visit (INDEPENDENT_AMBULATORY_CARE_PROVIDER_SITE_OTHER): Payer: Medicare Other | Admitting: Family Medicine

## 2016-06-06 ENCOUNTER — Encounter: Payer: Self-pay | Admitting: Family Medicine

## 2016-06-06 VITALS — BP 138/79 | HR 90 | Temp 98.6°F | Resp 18 | Ht 67.0 in | Wt 165.6 lb

## 2016-06-06 DIAGNOSIS — I10 Essential (primary) hypertension: Secondary | ICD-10-CM | POA: Diagnosis not present

## 2016-06-06 DIAGNOSIS — E7800 Pure hypercholesterolemia, unspecified: Secondary | ICD-10-CM

## 2016-06-06 DIAGNOSIS — M51369 Other intervertebral disc degeneration, lumbar region without mention of lumbar back pain or lower extremity pain: Secondary | ICD-10-CM

## 2016-06-06 DIAGNOSIS — Z800 Family history of malignant neoplasm of digestive organs: Secondary | ICD-10-CM | POA: Insufficient documentation

## 2016-06-06 DIAGNOSIS — Z0000 Encounter for general adult medical examination without abnormal findings: Secondary | ICD-10-CM

## 2016-06-06 DIAGNOSIS — Z794 Long term (current) use of insulin: Secondary | ICD-10-CM | POA: Diagnosis not present

## 2016-06-06 DIAGNOSIS — E2839 Other primary ovarian failure: Secondary | ICD-10-CM | POA: Diagnosis not present

## 2016-06-06 DIAGNOSIS — E010 Iodine-deficiency related diffuse (endemic) goiter: Secondary | ICD-10-CM

## 2016-06-06 DIAGNOSIS — Z9889 Other specified postprocedural states: Secondary | ICD-10-CM | POA: Diagnosis not present

## 2016-06-06 DIAGNOSIS — Z720 Tobacco use: Secondary | ICD-10-CM

## 2016-06-06 DIAGNOSIS — Z8719 Personal history of other diseases of the digestive system: Secondary | ICD-10-CM | POA: Diagnosis not present

## 2016-06-06 DIAGNOSIS — M5136 Other intervertebral disc degeneration, lumbar region: Secondary | ICD-10-CM | POA: Diagnosis not present

## 2016-06-06 DIAGNOSIS — E119 Type 2 diabetes mellitus without complications: Secondary | ICD-10-CM

## 2016-06-06 MED ORDER — PRAVASTATIN SODIUM 40 MG PO TABS: ORAL_TABLET | ORAL | 3 refills | Status: DC

## 2016-06-06 MED ORDER — TIOTROPIUM BROMIDE MONOHYDRATE 18 MCG IN CAPS: 18.0000 ug | ORAL_CAPSULE | Freq: Every day | RESPIRATORY_TRACT | 3 refills | Status: DC

## 2016-06-06 MED ORDER — METFORMIN HCL 1000 MG PO TABS: 1000.0000 mg | ORAL_TABLET | Freq: Two times a day (BID) | ORAL | 3 refills | Status: DC

## 2016-06-06 MED ORDER — METOPROLOL SUCCINATE ER 25 MG PO TB24: 25.0000 mg | ORAL_TABLET | Freq: Every day | ORAL | 3 refills | Status: DC

## 2016-06-06 MED ORDER — GLIMEPIRIDE 2 MG PO TABS: 2.0000 mg | ORAL_TABLET | Freq: Two times a day (BID) | ORAL | 3 refills | Status: DC

## 2016-06-06 MED ORDER — FLUTICASONE FUROATE-VILANTEROL 100-25 MCG/INH IN AEPB: 1.0000 | INHALATION_SPRAY | Freq: Every day | RESPIRATORY_TRACT | 3 refills | Status: DC

## 2016-06-06 MED ORDER — LISINOPRIL 10 MG PO TABS: 10.0000 mg | ORAL_TABLET | Freq: Every day | ORAL | 3 refills | Status: DC

## 2016-06-06 MED ORDER — NEEDLES & SYRINGES MISC: 3 refills | Status: DC

## 2016-06-06 MED ORDER — ZOSTER VAC RECOMB ADJUVANTED 50 MCG/0.5ML IM SUSR: 0.5000 mL | Freq: Once | INTRAMUSCULAR | 1 refills | Status: AC

## 2016-06-06 NOTE — Patient Instructions (Addendum)
   IF you received an x-ray today, you will receive an invoice from [facility]. Please contact [facility] at [phone] with questions or concerns regarding your invoice.   IF you received labwork today, you will receive an invoice from [facility]. Please contact [facility] at [phone] with questions or concerns regarding your invoice.   Our billing staff will not be able to assist you with questions regarding bills from these companies.  You will be contacted with the lab results as soon as they are available. The fastest way to get your results is to activate your My Chart account. Instructions are located on the last page of this paperwork. If you have not heard from us regarding the results in 2 weeks, please contact this office.      Preventive Care 72 Years and Older, Female Preventive care refers to lifestyle choices and visits with your health care provider that can promote health and wellness. What does preventive care include?  A yearly physical exam. This is also called an annual well check.  Dental exams once or twice a year.  Routine eye exams. Ask your health care provider how often you should have your eyes checked.  Personal lifestyle choices, including:  Daily care of your teeth and gums.  Regular physical activity.  Eating a healthy diet.  Avoiding tobacco and drug use.  Limiting alcohol use.  Practicing safe sex.  Taking low-dose aspirin every day.  Taking vitamin and mineral supplements as recommended by your health care provider. What happens during an annual well check? The services and screenings done by your health care provider during your annual well check will depend on your age, overall health, lifestyle risk factors, and family history of disease. Counseling  Your health care provider may ask you questions about your:  Alcohol use.  Tobacco use.  Drug use.  Emotional well-being.  Home and relationship  well-being.  Sexual activity.  Eating habits.  History of falls.  Memory and ability to understand (cognition).  Work and work environment.  Reproductive health. Screening  You may have the following tests or measurements:  Height, weight, and BMI.  Blood pressure.  Lipid and cholesterol levels. These may be checked every 5 years, or more frequently if you are over 50 years old.  Skin check.  Lung cancer screening. You may have this screening every year starting at age 55 if you have a 30-pack-year history of smoking and currently smoke or have quit within the past 15 years.  Fecal occult blood test (FOBT) of the stool. You may have this test every year starting at age 50.  Flexible sigmoidoscopy or colonoscopy. You may have a sigmoidoscopy every 5 years or a colonoscopy every 10 years starting at age 50.  Hepatitis C blood test.  Hepatitis B blood test.  Sexually transmitted disease (STD) testing.  Diabetes screening. This is done by checking your blood sugar (glucose) after you have not eaten for a while (fasting). You may have this done every 1-3 years.  Bone density scan. This is done to screen for osteoporosis. You may have this done starting at age 65.  Mammogram. This may be done every 1-2 years. Talk to your health care provider about how often you should have regular mammograms. Talk with your health care provider about your test results, treatment options, and if necessary, the need for more tests. Vaccines  Your health care provider may recommend certain vaccines, such as:  Influenza vaccine. This is recommended every year.    Tetanus, diphtheria, and acellular pertussis (Tdap, Td) vaccine. You may need a Td booster every 10 years.  Varicella vaccine. You may need this if you have not been vaccinated.  Zoster vaccine. You may need this after age 59.  Measles, mumps, and rubella (MMR) vaccine. You may need at least one dose of MMR if you were born in 1957  or later. You may also need a second dose.  Pneumococcal 13-valent conjugate (PCV13) vaccine. One dose is recommended after age 68.  Pneumococcal polysaccharide (PPSV23) vaccine. One dose is recommended after age 50.  Meningococcal vaccine. You may need this if you have certain conditions.  Hepatitis A vaccine. You may need this if you have certain conditions or if you travel or work in places where you may be exposed to hepatitis A.  Hepatitis B vaccine. You may need this if you have certain conditions or if you travel or work in places where you may be exposed to hepatitis B.  Haemophilus influenzae type b (Hib) vaccine. You may need this if you have certain conditions. Talk to your health care provider about which screenings and vaccines you need and how often you need them. This information is not intended to replace advice given to you by your health care provider. Make sure you discuss any questions you have with your health care provider. Document Released: 01/28/2015 Document Revised: 09/21/2015 Document Reviewed: 11/02/2014 Elsevier Interactive Patient Education  2017 [employer].

## 2016-06-06 NOTE — Progress Notes (Signed)
   Subjective:    Patient ID: [name], female    DOB: Jun 25, 1945, 71 y.o.   MRN: [id number]  HPI    Review of Systems  Constitutional:       Sweating       Objective:   Physical Exam        Assessment & Plan:

## 2016-06-06 NOTE — Progress Notes (Signed)
Subjective:    Patient ID: [name], female    DOB: [birth year], 71 y.o.   MRN: [id number]  06/06/2016  Annual Exam and Medication Refill (Insulin medication; need to discuss which one due to insurance)   HPI This 71 y.o. female presents for Annual Wellness Examination and follow-up of chronic medical conditions.  Last physical:  05-12-2014 Pap smear:  N/a; hysterectomy; age Mammogram:  04/23/2016 Colonoscopy:  03/2014; Dr. Benson Norway. Bone density:  05/08/2007 Eye exam:  04/25/2016 Dental exam:    Immunization History  Administered Date(s) Administered  . Influenza Split 09/28/2011, 09/15/2012, 09/23/2013  . Influenza,inj,Quad PF,36+ Mos 09/21/2014  . Influenza-Unspecified 09/29/2015  . Pneumococcal Conjugate-13 02/07/2015  . Pneumococcal Polysaccharide-23 12/20/2011  . Tdap 09/21/2014  . Zoster 04/17/2012   BP Readings from Last 3 Encounters:  06/06/16 138/79  02/28/16 (!) 148/92  08/31/15 122/80   Wt Readings from Last 3 Encounters:  06/06/16 165 lb 9.6 oz (75.1 kg)  02/28/16 165 lb (74.8 kg)  08/31/15 161 lb (73 kg)    HTN: started Metoprolol ER for tachycardia and hypertension.  Patient reports good compliance with medication, good tolerance to medication, and good symptom control.    COPD: started Breo inhaler.  Breathing has improved. Patient reports good compliance with medication, good tolerance to medication, and good symptom control.    DMII: increased Lantus to 40 untis daily.   HgbA1c 10.4.   Patient reports good compliance with medication, good tolerance to medication, and good symptom control.  Non-compliant with diet.  L shoulder fracture/injury: going to get haircut.  Wanted to go to a store afterwards.  Grandchildren uses daughter's car all the time; getting gas.  Went to [employer] for gas.  Not sure if foot hid sidewalk and fell.  Young lady helped get her up.  Cannot remember if fell face down or backwards.  Two employees came running out of store.  When helped  pt up; unable to move L arm.  No dislocation.  No surgery.  Started physical therapy this week.    Review of Systems  Constitutional: Negative for activity change, appetite change, chills, diaphoresis, fatigue, fever and unexpected weight change.  HENT: Negative for congestion, dental problem, drooling, ear discharge, ear pain, facial swelling, hearing loss, mouth sores, nosebleeds, postnasal drip, rhinorrhea, sinus pressure, sneezing, sore throat, tinnitus, trouble swallowing and voice change.   Eyes: Negative for photophobia, pain, discharge, redness, itching and visual disturbance.  Respiratory: Negative for apnea, cough, choking, chest tightness, shortness of breath, wheezing and stridor.   Cardiovascular: Negative for chest pain, palpitations and leg swelling.  Gastrointestinal: Negative for abdominal distention, abdominal pain, anal bleeding, blood in stool, constipation, diarrhea, nausea, rectal pain and vomiting.  Endocrine: Negative for cold intolerance, heat intolerance, polydipsia, polyphagia and polyuria.  Genitourinary: Negative for decreased urine volume, difficulty urinating, dyspareunia, dysuria, enuresis, flank pain, frequency, genital sores, hematuria, menstrual problem, pelvic pain, urgency, vaginal bleeding, vaginal discharge and vaginal pain.       Nocturia x 3-4.  No leakage.  Musculoskeletal: Positive for arthralgias. Negative for back pain, gait problem, joint swelling, myalgias, neck pain and neck stiffness.  Skin: Negative for color change, pallor, rash and wound.  Allergic/Immunologic: Negative for environmental allergies, food allergies and immunocompromised state.  Neurological: Negative for dizziness, tremors, seizures, syncope, facial asymmetry, speech difficulty, weakness, light-headedness, numbness and headaches.  Hematological: Negative for adenopathy. Does not bruise/bleed easily.  Psychiatric/Behavioral: Positive for dysphoric mood. Negative for agitation,  behavioral problems, confusion, decreased concentration, hallucinations, self-injury,  sleep disturbance and suicidal ideas. The patient is not nervous/anxious and is not hyperactive.        Bedtime 11:00p-1:00am; wakes up 7:00-9:00    Past Medical History:  Diagnosis Date  . Diabetes mellitus   . Hyperlipidemia   . Hypertension   . Night sweats   . Rectal polyp    Past Surgical History:  Procedure Laterality Date  . ABDOMINAL HYSTERECTOMY  2000   fibroids; ovaries intact.  [facility] BACK SURGERY     plate put in back  . 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 TUBAL LIGATION     No Known Allergies  Social History   Social History  . Marital status: Divorced    Spouse name: N/A  . Number of children: 3  . Years of education: N/A   Occupational History  . retired     [place] History Main Topics  . Smoking status: Current Every Day Smoker    Packs/day: 1.50    Years: 50.00    Types: Cigarettes  . Smokeless tobacco: Never Used  . Alcohol use No  . Drug use: No  . Sexual activity: Not on file   Other Topics Concern  . Not on file   Social History Narrative   Marital status: divorced; not dating in 2018 but interested slightly.        Children:  3 children; 7 seven grandchildren; 1 gg      Lives: with oldest daughter, 2 grandchildren, 76 gg, 1 friend of daughters, daughter's boyfriend.       Employment: retired [employer] age 54      Tobacco; 1ppd x 50 years      Alcohol: none      Exercise: a little in 2018; tries to walk for 10-15 minutes per day.      ADLs:   independent with ADLs; no transportation in 2018.      Advanced Directives:    none   Family History  Problem Relation Age of Onset  . Diabetes Sister   . Diabetes Brother   . Hypertension Brother   . Cancer Mother 81       colon(age 65) and lung (age 41)  . Diabetes Mother   . Heart disease Mother 15       CABG  . Hyperlipidemia Mother   . Cancer Father 74        stomach  . Diabetes Maternal Grandmother   . Mental illness Sister        Objective:    BP 138/79   Pulse 90   Temp 98.6 F (37 C) (Oral)   Resp 18   Ht 5\' 7"  (1.702 m)   Wt 165 lb 9.6 oz (75.1 kg)   SpO2 100%   BMI 25.94 kg/m  Physical Exam  Constitutional: She is oriented to person, place, and time. She appears well-developed and well-nourished. No distress.  HENT:  Head: Normocephalic and atraumatic.  Right Ear: External ear normal.  Left Ear: External ear normal.  Nose: Nose normal.  Mouth/Throat: Oropharynx is clear and moist.  Eyes: Conjunctivae and EOM are normal. Pupils are equal, round, and reactive to light.  Neck: Normal range of motion and full passive range of motion without pain. Neck supple. No JVD present. Carotid bruit is not present. Thyromegaly present.  Cardiovascular: Normal rate, regular rhythm and normal heart sounds.  Exam reveals no gallop and no friction rub.   No murmur  heard. Pulmonary/Chest: Effort normal and breath sounds normal. She has no wheezes. She has no rales. Right breast exhibits no inverted nipple, no mass, no nipple discharge, no skin change and no tenderness. Left breast exhibits no inverted nipple, no mass, no nipple discharge, no skin change and no tenderness. Breasts are symmetrical.  Abdominal: Soft. Bowel sounds are normal. She exhibits no distension and no mass. There is no tenderness. There is no rebound and no guarding.  Musculoskeletal:       Right shoulder: Normal.       Left shoulder: Normal.       Cervical back: Normal.  Lymphadenopathy:    She has no cervical adenopathy.  Neurological: She is alert and oriented to person, place, and time. She has normal reflexes. No cranial nerve deficit. She exhibits normal muscle tone. Coordination normal.  Skin: Skin is warm and dry. No rash noted. She is not diaphoretic. No erythema. No pallor.  Psychiatric: She has a normal mood and affect. Her behavior is normal. Judgment and thought  content normal.  Nursing note and vitals reviewed.  Results for orders placed or performed in visit on 05/09/16  HM DIABETES EYE EXAM  Result Value Ref Range   HM Diabetic Eye Exam No Retinopathy No Retinopathy   Depression screen [facility] 2/9 06/06/2016 02/28/2016 08/31/2015 06/07/2015 05/31/2015  Decreased Interest 0 0 0 0 0  Down, Depressed, Hopeless 0 0 0 0 0  PHQ - 2 Score 0 0 0 0 0   Fall Risk  06/06/2016 02/28/2016 08/31/2015 06/07/2015 05/31/2015  Falls in the past year? Yes No No No No  Number falls in past yr: 1 - - - -  Injury with Fall? Yes - - - -   Functional Status Survey: Is the patient deaf or have difficulty hearing?: No Does the patient have difficulty seeing, even when wearing glasses/contacts?: No Does the patient have difficulty concentrating, remembering, or making decisions?: No Does the patient have difficulty walking or climbing stairs?: No Does the patient have difficulty dressing or bathing?: No (sometimes now due to shoulder injury from falling) Does the patient have difficulty doing errands alone such as visiting a doctor's office or shopping?: No     Assessment & Plan:   1. Encounter for Medicare annual wellness exam   2. Type 2 diabetes mellitus without complication, with long-term current use of insulin ([place])   3. Essential hypertension   4. DDD (degenerative disc disease), lumbar   5. History of rectal polypectomy   6. Pure hypercholesterolemia   7. Tobacco user   8. Estrogen deficiency   9. Family history of colon cancer in mother   34. Thyromegaly    -anticipatory guidance provided --- exercise, weight loss, safe driving practices, aspirin 81mg  daily. -obtain age appropriate screening labs and labs for chronic disease management. --moderate fall risk; no evidence of depression; no evidence of hearing loss.  Discussed advanced directives and living will; also discussed end of life issues including code status.  -thyromegaly on exam; refer for thyroid US.  Obtain thyroid labs.    Orders Placed This Encounter  Procedures  . DG Bone Density    Standing Status:   Future    Standing Expiration Date:   08/06/2017    Order Specific Question:   Reason for Exam (SYMPTOM  OR DIAGNOSIS REQUIRED)    Answer:   estrogen deficiency    Order Specific Question:   Preferred imaging location?    Answer:   [facility]  .  US THYROID    Standing Status:   Future    Standing Expiration Date:   08/06/2017    Order Specific Question:   Reason for Exam (SYMPTOM  OR DIAGNOSIS REQUIRED)    Answer:   R sided thyroid enlargement    Order Specific Question:   Preferred imaging location?    Answer:   [facility]  . CBC with Differential/Platelet  . Comprehensive metabolic panel    Order Specific Question:   Has the patient fasted?    Answer:   Yes  . Hemoglobin A1c  . Lipid panel    Order Specific Question:   Has the patient fasted?    Answer:   Yes  . TSH  . T4, free  . POCT urinalysis dipstick  . HM Diabetes Foot Exam   Meds ordered this encounter  Medications  . Zoster Vac Recomb Adjuvanted [facility]) injection    Sig: Inject 0.5 mLs into the muscle once.    Dispense:  0.5 mL    Refill:  1  . glimepiride (AMARYL) 2 MG tablet    Sig: Take 1 tablet (2 mg total) by mouth 2 (two) times daily with a meal.    Dispense:  180 tablet    Refill:  3  . lisinopril (PRINIVIL,ZESTRIL) 10 MG tablet    Sig: Take 1 tablet (10 mg total) by mouth daily.    Dispense:  90 tablet    Refill:  3  . metFORMIN (GLUCOPHAGE) 1000 MG tablet    Sig: Take 1 tablet (1,000 mg total) by mouth 2 (two) times daily with a meal.    Dispense:  180 tablet    Refill:  3  . metoprolol succinate (TOPROL-XL) 25 MG 24 hr tablet    Sig: Take 1 tablet (25 mg total) by mouth daily.    Dispense:  90 tablet    Refill:  3  . Needles & Syringes MISC    Sig: Insulin needles and syringes    Dispense:  100 each    Refill:  3  . pravastatin (PRAVACHOL) 40 MG tablet    Sig: Take 1  tablet by mouth  every evening after a meal    Dispense:  90 tablet    Refill:  3  . tiotropium (SPIRIVA) 18 MCG inhalation capsule    Sig: Place 1 capsule (18 mcg total) into inhaler and inhale daily.    Dispense:  90 capsule    Refill:  3  . fluticasone furoate-vilanterol (BREO ELLIPTA) 100-25 MCG/INH AEPB    Sig: Inhale 1 puff into the lungs daily.    Dispense:  90 each    Refill:  3    Return in about 3 months (around 09/06/2016) for recheck DIABETES, HIGH BLOOD PRESSURE.   Kenneth Lax Elayne Guerin, M.D. [facility] at [facility] [address] [place], [place]  [address] [phone] phone [phone] fax

## 2016-06-07 DIAGNOSIS — S42202D Unspecified fracture of upper end of left humerus, subsequent encounter for fracture with routine healing: Secondary | ICD-10-CM | POA: Diagnosis not present

## 2016-06-07 LAB — CBC WITH DIFFERENTIAL/PLATELET
BASOS: 0 %
Basophils Absolute: 0 10*3/uL (ref 0.0–0.2)
EOS (ABSOLUTE): 0.1 10*3/uL (ref 0.0–0.4)
EOS: 1 %
Hematocrit: 45.6 % (ref 34.0–46.6)
Hemoglobin: 14.7 g/dL (ref 11.1–15.9)
IMMATURE GRANS (ABS): 0 10*3/uL (ref 0.0–0.1)
IMMATURE GRANULOCYTES: 0 %
LYMPHS: 44 %
Lymphocytes Absolute: 3.4 10*3/uL — ABNORMAL HIGH (ref 0.7–3.1)
MCH: 27.8 pg (ref 26.6–33.0)
MCHC: 32.2 g/dL (ref 31.5–35.7)
MCV: 86 fL (ref 79–97)
MONOS ABS: 0.5 10*3/uL (ref 0.1–0.9)
Monocytes: 7 %
NEUTROS PCT: 48 %
Neutrophils Absolute: 3.7 10*3/uL (ref 1.4–7.0)
Platelets: 276 10*3/uL (ref 150–379)
RBC: 5.29 x10E6/uL — AB (ref 3.77–5.28)
RDW: 15.2 % (ref 12.3–15.4)
WBC: 7.7 10*3/uL (ref 3.4–10.8)

## 2016-06-07 LAB — COMPREHENSIVE METABOLIC PANEL
A/G RATIO: 1.5 (ref 1.2–2.2)
ALT: 13 IU/L (ref 0–32)
AST: 14 IU/L (ref 0–40)
Albumin: 4 g/dL (ref 3.5–4.8)
Alkaline Phosphatase: 120 IU/L — ABNORMAL HIGH (ref 39–117)
BUN/Creatinine Ratio: 17 (ref 12–28)
BUN: 12 mg/dL (ref 8–27)
Bilirubin Total: 0.3 mg/dL (ref 0.0–1.2)
CALCIUM: 10 mg/dL (ref 8.7–10.3)
CO2: 23 mmol/L (ref 18–29)
CREATININE: 0.72 mg/dL (ref 0.57–1.00)
Chloride: 103 mmol/L (ref 96–106)
GFR, EST AFRICAN AMERICAN: 97 mL/min/{1.73_m2} (ref >59)
GFR, EST NON AFRICAN AMERICAN: 85 mL/min/{1.73_m2} (ref >59)
GLOBULIN, TOTAL: 2.7 g/dL (ref 1.5–4.5)
Glucose: 136 mg/dL — ABNORMAL HIGH (ref 65–99)
Potassium: 4.8 mmol/L (ref 3.5–5.2)
Sodium: 143 mmol/L (ref 134–144)
TOTAL PROTEIN: 6.7 g/dL (ref 6.0–8.5)

## 2016-06-07 LAB — LIPID PANEL
CHOLESTEROL TOTAL: 147 mg/dL (ref 100–199)
Chol/HDL Ratio: 2.9 ratio (ref 0.0–4.4)
HDL: 50 mg/dL (ref >39)
LDL CALC: 75 mg/dL (ref 0–99)
Triglycerides: 111 mg/dL (ref 0–149)
VLDL Cholesterol Cal: 22 mg/dL (ref 5–40)

## 2016-06-07 LAB — T4, FREE: Free T4: 1.4 ng/dL (ref 0.82–1.77)

## 2016-06-07 LAB — HEMOGLOBIN A1C
ESTIMATED AVERAGE GLUCOSE: 232 mg/dL
Hgb A1c MFr Bld: 9.7 % — ABNORMAL HIGH (ref 4.8–5.6)

## 2016-06-07 LAB — TSH: TSH: 1.14 u[IU]/mL (ref 0.450–4.500)

## 2016-06-13 ENCOUNTER — Telehealth: Payer: Self-pay | Admitting: Family Medicine

## 2016-06-13 NOTE — Telephone Encounter (Signed)
Pt is needing to make sure that Dr.Smith to filled out the form for her lantus and that the pharmacy has the change due to insurance not covering her insulin now  Best number [phone]

## 2016-06-13 NOTE — Telephone Encounter (Signed)
Please advise 

## 2016-06-14 ENCOUNTER — Other Ambulatory Visit: Payer: Self-pay | Admitting: Emergency Medicine

## 2016-06-14 DIAGNOSIS — S42202D Unspecified fracture of upper end of left humerus, subsequent encounter for fracture with routine healing: Secondary | ICD-10-CM | POA: Diagnosis not present

## 2016-06-14 MED ORDER — INSULIN GLARGINE 100 UNIT/ML SOLOSTAR PEN: 40.0000 [IU] | PEN_INJECTOR | Freq: Every day | SUBCUTANEOUS | 11 refills | Status: DC

## 2016-06-15 NOTE — Telephone Encounter (Signed)
Left message on patient's voicemail to clarify message.  Appears that Lantus 40units was sent in yesterday on 06/14/16 to [facility].  This should replace Levemir that was prescribed on 08/2015.  No further action warranted at this time.

## 2016-06-19 DIAGNOSIS — S42202D Unspecified fracture of upper end of left humerus, subsequent encounter for fracture with routine healing: Secondary | ICD-10-CM | POA: Diagnosis not present

## 2016-06-21 DIAGNOSIS — S42202D Unspecified fracture of upper end of left humerus, subsequent encounter for fracture with routine healing: Secondary | ICD-10-CM | POA: Diagnosis not present

## 2016-06-25 ENCOUNTER — Telehealth: Payer: Self-pay | Admitting: Family Medicine

## 2016-06-25 NOTE — Telephone Encounter (Signed)
PT IS VERY VERY UPSET SHE STATES THAT SHE HAS BEEN WAITING SINCE FEBRUARY FOR HER LANTUS HER PHARMACY HAS CALLED [place] ALSO SHE STATES THAT SHE HAS BEEN BACK AND [place] WITH THE PHONES AND SHE HAD TO END UP COMING UP HERE AND DR Tamala Julian CALLED HER AND TOLD HER THAT LANTUS HAS BEEN CALLED IN SHE STATES THAT SHE HAS A HEADACHE AND THAT SHE HAS TO TAKE G=HER INSULINE EVERYDAY SHE IS GOING OUT OF [place] ON THE [place]TH AND WILL BE HIGHLY UPSET IF SHE END UP GOING OUT OF TOWN WITHOUT HER MEDICINE [facility] KEEP TELLING PT THEY DO NOT HAVE THE MEDICINE TO KEEP CALLING us IF WE HAVE TO CALL WE RESEND OR CALL IN RX PT CALL [facility] Busker BACK SHE'S VERY UPSET WITH THE WHOLE PROCESS

## 2016-06-26 DIAGNOSIS — S42202D Unspecified fracture of upper end of left humerus, subsequent encounter for fracture with routine healing: Secondary | ICD-10-CM | POA: Diagnosis not present

## 2016-06-26 NOTE — Telephone Encounter (Signed)
PT [place] PHONE CALL

## 2016-06-26 NOTE — Telephone Encounter (Signed)
Spoke with pharmacist at [facility]. Verbal order given for Lantus Solostar pen to prevent back and forth delay. Pharmacist placed rush order on medication Pt is aware and advised to call clinic if not received within 2 days.

## 2016-06-26 NOTE — Telephone Encounter (Signed)
Attempted to reach patient but number listed vm is full I will contact [facility] to find out what is the delay; script was sent 5/31

## 2016-06-27 DIAGNOSIS — S42295D Other nondisplaced fracture of upper end of left humerus, subsequent encounter for fracture with routine healing: Secondary | ICD-10-CM | POA: Diagnosis not present

## 2016-07-02 ENCOUNTER — Other Ambulatory Visit: Payer: Self-pay | Admitting: Emergency Medicine

## 2016-07-02 MED ORDER — "INSULIN SYRINGE-NEEDLE U-100 31G X 5/16"" 1 ML MISC": 3 refills | Status: DC

## 2016-07-02 NOTE — Progress Notes (Unsigned)
Spoke with [employer] order placed on 32g Ultra fine 4' pens  Pt made aware

## 2016-07-02 NOTE — Telephone Encounter (Signed)
Message taken care of. Reordered 32g Ultra fine needles per [facility]

## 2016-07-02 NOTE — Telephone Encounter (Signed)
PATIENT CALLED TO SAY SHE HAS ABOUT 5 TO 6 DAYS OF HER REGULAR INSULIN WHERE SHE USES A SYRINGE. THEN SHE WILL START HER NEW INSULIN THAT REQUIRES USING PEN NEEDLES. SHE NEEDS THEM CALLED TO HER PHARMACY AS SOON AS POSSIBLE SO THAT IT WILL HAVE TIME TO GET TO HER. BEST PHONE [phone] (CELL) PHARMACY CHOICE IS [facility]. [place]

## 2016-07-04 ENCOUNTER — Ambulatory Visit
Admission: RE | Admit: 2016-07-04 | Discharge: 2016-07-04 | Disposition: A | Discharge status: Home / Self Care | Payer: Medicare Other | Source: Ambulatory Visit | Attending: Family Medicine | Admitting: Family Medicine

## 2016-07-04 DIAGNOSIS — E042 Nontoxic multinodular goiter: Secondary | ICD-10-CM | POA: Diagnosis not present

## 2016-07-04 DIAGNOSIS — E010 Iodine-deficiency related diffuse (endemic) goiter: Secondary | ICD-10-CM

## 2016-07-09 ENCOUNTER — Other Ambulatory Visit: Payer: Self-pay | Admitting: Family Medicine

## 2016-07-09 DIAGNOSIS — E041 Nontoxic single thyroid nodule: Secondary | ICD-10-CM

## 2016-07-15 NOTE — Addendum Note (Signed)
Addended by: Wardell Honour on: 07/15/2016 04:30 PM   Modules accepted: Orders

## 2016-07-24 ENCOUNTER — Ambulatory Visit
Admission: RE | Admit: 2016-07-24 | Discharge: 2016-07-24 | Disposition: A | Discharge status: Home / Self Care | Payer: Medicare Other | Source: Ambulatory Visit | Attending: Family Medicine | Admitting: Family Medicine

## 2016-07-24 ENCOUNTER — Other Ambulatory Visit (HOSPITAL_COMMUNITY)
Admission: RE | Admit: 2016-07-24 | Discharge: 2016-07-24 | Disposition: A | Discharge status: Home / Self Care | Payer: Medicare Other | Source: Ambulatory Visit | Attending: Physician Assistant | Admitting: Physician Assistant

## 2016-07-24 DIAGNOSIS — E041 Nontoxic single thyroid nodule: Secondary | ICD-10-CM | POA: Diagnosis not present

## 2016-07-24 NOTE — Procedures (Signed)
PROCEDURE SUMMARY:  Using direct ultrasound guidance, 4 passes were made using 25 g needles into the nodule within the right lobe of the thyroid.   Ultrasound was used to confirm needle placements on all occasions.   Specimens were sent to Pathology for analysis.  Trana Ressler S Shamone Winzer PA-C 07/24/2016 3:26 PM

## 2016-07-30 DIAGNOSIS — S42295D Other nondisplaced fracture of upper end of left humerus, subsequent encounter for fracture with routine healing: Secondary | ICD-10-CM | POA: Diagnosis not present

## 2016-08-01 ENCOUNTER — Telehealth: Payer: Self-pay | Admitting: Family Medicine

## 2016-08-01 NOTE — Telephone Encounter (Signed)
PATIENT WOULD LIKE DR. Tamala Julian TO KNOW THAT SHE HAD HER BIOPSY OF HER THYROID ON 07/24/16 AT [facility]. THEY TOLD HER DR. Tamala Julian SHOULD GET THE RESULTS BY THIS PAST Friday. SHE IS VERY ANXIOUS TO GET THE RESULTS. BEST PHONE [phone] (CELL) PHARMACY CHOICE IF NEEDED IS [facility] [place] OR [facility] [place]

## 2016-08-01 NOTE — Telephone Encounter (Signed)
Please advise. dg 

## 2016-08-03 NOTE — Telephone Encounter (Signed)
Spoke with patient --- thyroid biopsy negative.

## 2016-08-20 ENCOUNTER — Ambulatory Visit
Admission: RE | Admit: 2016-08-20 | Discharge: 2016-08-20 | Disposition: A | Discharge status: Home / Self Care | Payer: Medicare Other | Source: Ambulatory Visit | Attending: Family Medicine | Admitting: Family Medicine

## 2016-08-20 DIAGNOSIS — Z780 Asymptomatic menopausal state: Secondary | ICD-10-CM | POA: Diagnosis not present

## 2016-08-20 DIAGNOSIS — Z13820 Encounter for screening for osteoporosis: Secondary | ICD-10-CM | POA: Diagnosis not present

## 2016-08-20 DIAGNOSIS — E2839 Other primary ovarian failure: Secondary | ICD-10-CM

## 2016-08-24 ENCOUNTER — Telehealth: Payer: Self-pay | Admitting: Family Medicine

## 2016-08-24 NOTE — Telephone Encounter (Signed)
ERROR

## 2016-08-24 NOTE — Telephone Encounter (Signed)
SMITH - Pt brought by form showing her sugar levels to be signed, scanned in her chart and also faxed to [phone].  I have left the form in your box to be signed.

## 2016-08-28 NOTE — Telephone Encounter (Signed)
FYI

## 2016-09-10 DIAGNOSIS — E041 Nontoxic single thyroid nodule: Secondary | ICD-10-CM | POA: Insufficient documentation

## 2016-09-10 DIAGNOSIS — J449 Chronic obstructive pulmonary disease, unspecified: Secondary | ICD-10-CM | POA: Insufficient documentation

## 2016-09-10 DIAGNOSIS — E7800 Pure hypercholesterolemia, unspecified: Secondary | ICD-10-CM | POA: Insufficient documentation

## 2016-09-10 NOTE — Progress Notes (Signed)
Subjective:    Patient ID: [name], female    DOB: 1945/10/02, 71 y.o.   MRN: [id number]  09/11/2016  Diabetes (3 month follow-up) and Hypertension   HPI This 71 y.o. female presents for three month follow-up of DMII, thyroid nodule s/p thyroid US with biopsy with benign pathology.  HgbA1c last visit of 9.7; Lantus increased to 46 units qhs; did not increase to 46units. Taking  Feels good.  Overall feels good. Tolerated thyroid biopsy well.  Needs follow-up in one year. Fasting sugar 83 this morning; sugars are labile.  Non-compliant with diet.   1 pen last seven days.   Not checking BP at home.  Sometimes hurts in B hips.  With urination, urine will keep coming.    LEFT medial lower leg: chronic itching.  Always itching; darker in R medial leg.  Darker skin on LEFT leg.  Leg is smaller.  No numbness or tingling.  No medications for leg; vaseline and lotion.  Prescribed medication; too expensive.  Prescribed Triamcinolone and was $50; did not get.  BP Readings from Last 3 Encounters:  09/11/16 134/70  06/06/16 138/79  02/28/16 (!) 148/92   Wt Readings from Last 3 Encounters:  09/11/16 172 lb (78 kg)  06/06/16 165 lb 9.6 oz (75.1 kg)  02/28/16 165 lb (74.8 kg)   Immunization History  Administered Date(s) Administered  . Influenza Split 09/28/2011, 09/15/2012, 09/23/2013  . Influenza,inj,Quad PF,6+ Mos 09/21/2014, 09/11/2016  . Influenza-Unspecified 09/29/2015  . Pneumococcal Conjugate-13 02/07/2015  . Pneumococcal Polysaccharide-23 12/20/2011  . Tdap 09/21/2014  . Zoster 04/17/2012    Review of Systems  Constitutional: Negative for chills, diaphoresis, fatigue and fever.  Eyes: Negative for visual disturbance.  Respiratory: Negative for cough and shortness of breath.   Cardiovascular: Negative for chest pain, palpitations and leg swelling.  Gastrointestinal: Negative for abdominal pain, constipation, diarrhea, nausea and vomiting.  Endocrine: Negative for cold  intolerance, heat intolerance, polydipsia, polyphagia and polyuria.  Skin: Positive for color change and rash.  Neurological: Negative for dizziness, tremors, seizures, syncope, facial asymmetry, speech difficulty, weakness, light-headedness, numbness and headaches.    Past Medical History:  Diagnosis Date  . Diabetes mellitus   . Hyperlipidemia   . Hypertension   . Night sweats   . Rectal polyp    Past Surgical History:  Procedure Laterality Date  . ABDOMINAL HYSTERECTOMY  2000   fibroids; ovaries intact.  [facility] BACK SURGERY     plate put in back  . 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 TUBAL LIGATION     No Known Allergies  Social History   Social History  . Marital status: Divorced    Spouse name: N/A  . Number of children: 3  . Years of education: N/A   Occupational History  . retired     [place] [employer] [place] History Main Topics  . Smoking status: Current Every Day Smoker    Packs/day: 1.50    Years: 50.00    Types: Cigarettes  . Smokeless tobacco: Never Used  . Alcohol use No  . Drug use: No  . Sexual activity: Not on file   Other Topics Concern  . Not on file   Social History Narrative   Marital status: divorced; not dating in 2018 but interested slightly.        Children:  3 children; 7 seven grandchildren; 1 gg      Lives: with oldest daughter, 2 grandchildren, 31 gg, 1 friend  of daughters, daughter's boyfriend.       Employment: retired [employer] age 49      Tobacco; 1ppd x 50 years      Alcohol: none      Exercise: a little in 2018; tries to walk for 10-15 minutes per day.      ADLs:   independent with ADLs; no transportation in 2018.      Advanced Directives:    none   Family History  Problem Relation Age of Onset  . Diabetes Sister   . Diabetes Brother   . Hypertension Brother   . Cancer Mother 23       colon(age 63) and lung (age 87)  . Diabetes Mother   . Heart disease Mother 18       CABG  . Hyperlipidemia  Mother   . Cancer Father 75       stomach  . Diabetes Maternal Grandmother   . Mental illness Sister        Objective:    BP 134/70   Pulse 91   Temp 98.3 F (36.8 C) (Oral)   Resp 16   Ht 5' 6.93" (1.7 m)   Wt 172 lb (78 kg)   SpO2 96%   BMI 27.00 kg/m  Physical Exam  Constitutional: She is oriented to person, place, and time. She appears well-developed and well-nourished. No distress.  HENT:  Head: Normocephalic and atraumatic.  Right Ear: External ear normal.  Left Ear: External ear normal.  Nose: Nose normal.  Mouth/Throat: Oropharynx is clear and moist.  Eyes: Pupils are equal, round, and reactive to light. Conjunctivae and EOM are normal.  Neck: Normal range of motion. Neck supple. Carotid bruit is not present. Thyromegaly present.  Cardiovascular: Normal rate, regular rhythm, normal heart sounds and intact distal pulses.  Exam reveals no gallop and no friction rub.   No murmur heard. Pulmonary/Chest: Effort normal and breath sounds normal. She has no wheezes. She has no rales.  Abdominal: Soft. Bowel sounds are normal. She exhibits no distension and no mass. There is no tenderness. There is no rebound and no guarding.  Lymphadenopathy:    She has no cervical adenopathy.  Neurological: She is alert and oriented to person, place, and time. No cranial nerve deficit.  Skin: Skin is warm and dry. Rash noted. She is not diaphoretic. No erythema. No pallor.  Hypopigmented area along LEFT anterior shin.  Psychiatric: She has a normal mood and affect. Her behavior is normal.   Results for orders placed or performed in visit on 09/11/16  POCT glycosylated hemoglobin (Hb A1C)  Result Value Ref Range   Hemoglobin A1C 10.1    No results found. Depression screen [facility] 2/9 09/11/2016 06/06/2016 02/28/2016 08/31/2015 06/07/2015  Decreased Interest 0 0 0 0 0  Down, Depressed, Hopeless 0 0 0 0 0  PHQ - 2 Score 0 0 0 0 0   Fall Risk  09/11/2016 06/06/2016 02/28/2016 08/31/2015 06/07/2015    Falls in the past year? No Yes No No No  Comment - Fractured left arm - - -  Number falls in past yr: - 1 - - -  Injury with Fall? - Yes - - -        Assessment & Plan:   1. Type 2 diabetes mellitus without complication, with long-term current use of insulin ([place])   2. Pure hypercholesterolemia   3. Essential hypertension   4. Thyroid nodule   5. Panlobular emphysema ([place])  6. Need for immunization against influenza   7. Dermatitis    -uncontrolled DMII; increase Lantus to 50 units qhs.    I recommend weight loss, exercise, and low-carbohydrate low-sugar food choices. You should AVOID: regular sodas, sweetened tea, fruit juices.  You should LIMIT: breads, pastas, rice, potatoes, and desserts/sweets.  I would recommend limiting your total carbohydrate intake per meal to 45 grams; I would limit your total carbohydrate intake per snack to 30 grams.  I would also have a goal of 60 grams of protein intake per day; this would equal 10-15 grams of protein per meal and 5-10 grams of protein per snack. -persistent dermatitis LEFT anterior shin; rx for hydrocortisone provided to apply bid. -s/p biopsy of thyroid nodule; pathology negative; repeat thyroid US in one year.   Orders Placed This Encounter  Procedures  . Flu Vaccine QUAD 36+ mos IM  . CBC with Differential/Platelet  . Comprehensive metabolic panel    Order Specific Question:   Has the patient fasted?    Answer:   Yes  . Lipid panel    Order Specific Question:   Has the patient fasted?    Answer:   Yes  . POCT glycosylated hemoglobin (Hb A1C)  . POCT urinalysis dipstick   Meds ordered this encounter  Medications  . hydrocortisone 2.5 % ointment    Sig: Apply topically 2 (two) times daily.    Dispense:  30 g    Refill:  0  . Zoster Vac Recomb Adjuvanted (SHINGRIX) injection    Sig: Inject 0.5 mLs into the muscle once.    Dispense:  0.5 mL    Refill:  1  . Insulin Glargine (LANTUS SOLOSTAR) 100 UNIT/ML Solostar Pen     Sig: Inject 50 Units into the skin daily at 10 pm.    Dispense:  15 mL    Refill:  11    DISPENSE: 3 MONTH SUPPLY    Return in about 3 months (around 12/12/2016) for recheck diabetes.   Nicolaus Andel Elayne Guerin, M.D. Primary Care at [facility] previously [facility] [address] [place], [place]  [phone] phone [phone] fax

## 2016-09-11 ENCOUNTER — Encounter: Payer: Self-pay | Admitting: Family Medicine

## 2016-09-11 ENCOUNTER — Ambulatory Visit (INDEPENDENT_AMBULATORY_CARE_PROVIDER_SITE_OTHER): Payer: Medicare Other | Admitting: Family Medicine

## 2016-09-11 VITALS — BP 134/70 | HR 91 | Temp 98.3°F | Resp 16 | Ht 66.93 in | Wt 172.0 lb

## 2016-09-11 DIAGNOSIS — I10 Essential (primary) hypertension: Secondary | ICD-10-CM | POA: Diagnosis not present

## 2016-09-11 DIAGNOSIS — E119 Type 2 diabetes mellitus without complications: Secondary | ICD-10-CM | POA: Diagnosis not present

## 2016-09-11 DIAGNOSIS — Z23 Encounter for immunization: Secondary | ICD-10-CM

## 2016-09-11 DIAGNOSIS — L309 Dermatitis, unspecified: Secondary | ICD-10-CM

## 2016-09-11 DIAGNOSIS — Z794 Long term (current) use of insulin: Secondary | ICD-10-CM

## 2016-09-11 DIAGNOSIS — E041 Nontoxic single thyroid nodule: Secondary | ICD-10-CM

## 2016-09-11 DIAGNOSIS — E7800 Pure hypercholesterolemia, unspecified: Secondary | ICD-10-CM | POA: Diagnosis not present

## 2016-09-11 DIAGNOSIS — J431 Panlobular emphysema: Secondary | ICD-10-CM | POA: Diagnosis not present

## 2016-09-11 LAB — POCT GLYCOSYLATED HEMOGLOBIN (HGB A1C): HEMOGLOBIN A1C: 10.1

## 2016-09-11 MED ORDER — INSULIN GLARGINE 100 UNIT/ML SOLOSTAR PEN: 50.0000 [IU] | PEN_INJECTOR | Freq: Every day | SUBCUTANEOUS | 11 refills | Status: DC

## 2016-09-11 MED ORDER — ZOSTER VAC RECOMB ADJUVANTED 50 MCG/0.5ML IM SUSR: 0.5000 mL | Freq: Once | INTRAMUSCULAR | 1 refills | Status: AC

## 2016-09-11 MED ORDER — HYDROCORTISONE 2.5 % EX OINT: TOPICAL_OINTMENT | Freq: Two times a day (BID) | CUTANEOUS | 0 refills | Status: DC

## 2016-09-11 NOTE — Patient Instructions (Addendum)
IF you received an x-ray today, you will receive an invoice from [facility]. Please contact [facility] at [phone] with questions or concerns regarding your invoice.   IF you received labwork today, you will receive an invoice from [facility]. Please contact [facility] at [phone] with questions or concerns regarding your invoice.   Our billing staff will not be able to assist you with questions regarding bills from these companies.  You will be contacted with the lab results as soon as they are available. The fastest way to get your results is to activate your My Chart account. Instructions are located on the last page of this paperwork. If you have not heard from [place] regarding the results in 2 weeks, please contact this office.     Diabetes Mellitus and Food It is important for you to manage your blood sugar (glucose) level. Your blood glucose level can be greatly affected by what you eat. Eating healthier foods in the appropriate amounts throughout the day at about the same time each day will help you control your blood glucose level. It can also help slow or prevent worsening of your diabetes mellitus. Healthy eating may even help you improve the level of your blood pressure and reach or maintain a healthy weight. General recommendations for healthful eating and cooking habits include:  Eating meals and snacks regularly. Avoid going long periods of time without eating to lose weight.  Eating a diet that consists mainly of plant-based foods, such as fruits, vegetables, nuts, legumes, and whole grains.  Using low-heat cooking methods, such as baking, instead of high-heat cooking methods, such as deep frying.  Work with your dietitian to make sure you understand how to use the Nutrition Facts information on food labels. How can food affect me? Carbohydrates Carbohydrates affect your blood glucose level more than any other type of food. Your dietitian will help  you determine how many carbohydrates to eat at each meal and teach you how to count carbohydrates. Counting carbohydrates is important to keep your blood glucose at a healthy level, especially if you are using insulin or taking certain medicines for diabetes mellitus. Alcohol Alcohol can cause sudden decreases in blood glucose (hypoglycemia), especially if you use insulin or take certain medicines for diabetes mellitus. Hypoglycemia can be a life-threatening condition. Symptoms of hypoglycemia (sleepiness, dizziness, and disorientation) are similar to symptoms of having too much alcohol. If your health care provider has given you approval to drink alcohol, do so in moderation and use the following guidelines:  Women should not have more than one drink per day, and men should not have more than two drinks per day. One drink is equal to: ? 12 oz of beer. ? 5 oz of wine. ? 1 oz of hard liquor.  Do not drink on an empty stomach.  Keep yourself hydrated. Have water, diet soda, or unsweetened iced tea.  Regular soda, juice, and other mixers might contain a lot of carbohydrates and should be counted.  What foods are not recommended? As you make food choices, it is important to remember that all foods are not the same. Some foods have fewer nutrients per serving than other foods, even though they might have the same number of calories or carbohydrates. It is difficult to get your body what it needs when you eat foods with fewer nutrients. Examples of foods that you should avoid that are high in calories and carbohydrates but low in nutrients include:  Trans fats (most processed  foods list trans fats on the Nutrition Facts label).  Regular soda.  Juice.  Candy.  Sweets, such as cake, pie, doughnuts, and cookies.  Fried foods.  What foods can I eat? Eat nutrient-rich foods, which will nourish your body and keep you healthy. The food you should eat also will depend on several factors,  including:  The calories you need.  The medicines you take.  Your weight.  Your blood glucose level.  Your blood pressure level.  Your cholesterol level.  You should eat a variety of foods, including:  Protein. ? Lean cuts of meat. ? Proteins low in saturated fats, such as fish, egg whites, and beans. Avoid processed meats.  Fruits and vegetables. ? Fruits and vegetables that may help control blood glucose levels, such as apples, mangoes, and yams.  Dairy products. ? Choose fat-free or low-fat dairy products, such as milk, yogurt, and cheese.  Grains, bread, pasta, and rice. ? Choose whole grain products, such as multigrain bread, whole oats, and brown rice. These foods may help control blood pressure.  Fats. ? Foods containing healthful fats, such as nuts, avocado, olive oil, canola oil, and fish.  Does everyone with diabetes mellitus have the same meal plan? Because every person with diabetes mellitus is different, there is not one meal plan that works for everyone. It is very important that you meet with a dietitian who will help you create a meal plan that is just right for you. This information is not intended to replace advice given to you by your health care provider. Make sure you discuss any questions you have with your health care provider. Document Released: 09/28/2004 Document Revised: 06/09/2015 Document Reviewed: 11/28/2012 Elsevier Interactive Patient Education  2017 [employer].

## 2016-09-12 LAB — LIPID PANEL
CHOL/HDL RATIO: 2.9 ratio (ref 0.0–4.4)
Cholesterol, Total: 152 mg/dL (ref 100–199)
HDL: 53 mg/dL (ref >39)
LDL CALC: 73 mg/dL (ref 0–99)
Triglycerides: 130 mg/dL (ref 0–149)
VLDL Cholesterol Cal: 26 mg/dL (ref 5–40)

## 2016-09-12 LAB — COMPREHENSIVE METABOLIC PANEL
ALBUMIN: 4.1 g/dL (ref 3.5–4.8)
ALT: 14 IU/L (ref 0–32)
AST: 13 IU/L (ref 0–40)
Albumin/Globulin Ratio: 1.6 (ref 1.2–2.2)
Alkaline Phosphatase: 122 IU/L — ABNORMAL HIGH (ref 39–117)
BUN / CREAT RATIO: 14 (ref 12–28)
BUN: 11 mg/dL (ref 8–27)
Bilirubin Total: 0.2 mg/dL (ref 0.0–1.2)
CALCIUM: 9.5 mg/dL (ref 8.7–10.3)
CO2: 20 mmol/L (ref 20–29)
CREATININE: 0.78 mg/dL (ref 0.57–1.00)
Chloride: 104 mmol/L (ref 96–106)
GFR, EST AFRICAN AMERICAN: 88 mL/min/{1.73_m2} (ref >59)
GFR, EST NON AFRICAN AMERICAN: 77 mL/min/{1.73_m2} (ref >59)
GLOBULIN, TOTAL: 2.5 g/dL (ref 1.5–4.5)
Glucose: 198 mg/dL — ABNORMAL HIGH (ref 65–99)
Potassium: 4.3 mmol/L (ref 3.5–5.2)
SODIUM: 142 mmol/L (ref 134–144)
Total Protein: 6.6 g/dL (ref 6.0–8.5)

## 2016-09-12 LAB — CBC WITH DIFFERENTIAL/PLATELET
BASOS: 0 %
Basophils Absolute: 0 10*3/uL (ref 0.0–0.2)
EOS (ABSOLUTE): 0.1 10*3/uL (ref 0.0–0.4)
EOS: 1 %
HEMATOCRIT: 44.9 % (ref 34.0–46.6)
HEMOGLOBIN: 14.8 g/dL (ref 11.1–15.9)
IMMATURE GRANULOCYTES: 0 %
Immature Grans (Abs): 0 10*3/uL (ref 0.0–0.1)
LYMPHS ABS: 3 10*3/uL (ref 0.7–3.1)
Lymphs: 38 %
MCH: 28 pg (ref 26.6–33.0)
MCHC: 33 g/dL (ref 31.5–35.7)
MCV: 85 fL (ref 79–97)
MONOCYTES: 6 %
Monocytes Absolute: 0.5 10*3/uL (ref 0.1–0.9)
NEUTROS PCT: 55 %
Neutrophils Absolute: 4.3 10*3/uL (ref 1.4–7.0)
Platelets: 263 10*3/uL (ref 150–379)
RBC: 5.28 x10E6/uL (ref 3.77–5.28)
RDW: 15.6 % — ABNORMAL HIGH (ref 12.3–15.4)
WBC: 7.9 10*3/uL (ref 3.4–10.8)

## 2016-09-17 ENCOUNTER — Encounter: Payer: Self-pay | Admitting: Family Medicine

## 2016-12-17 ENCOUNTER — Ambulatory Visit (INDEPENDENT_AMBULATORY_CARE_PROVIDER_SITE_OTHER): Payer: Medicare Other | Admitting: Family Medicine

## 2016-12-17 ENCOUNTER — Other Ambulatory Visit: Payer: Self-pay

## 2016-12-17 ENCOUNTER — Encounter: Payer: Self-pay | Admitting: Family Medicine

## 2016-12-17 VITALS — BP 136/78 | HR 96 | Temp 98.6°F | Resp 16 | Ht 67.32 in | Wt 177.0 lb

## 2016-12-17 DIAGNOSIS — B3749 Other urogenital candidiasis: Secondary | ICD-10-CM | POA: Diagnosis not present

## 2016-12-17 DIAGNOSIS — J431 Panlobular emphysema: Secondary | ICD-10-CM

## 2016-12-17 DIAGNOSIS — I10 Essential (primary) hypertension: Secondary | ICD-10-CM | POA: Diagnosis not present

## 2016-12-17 DIAGNOSIS — E7800 Pure hypercholesterolemia, unspecified: Secondary | ICD-10-CM | POA: Diagnosis not present

## 2016-12-17 DIAGNOSIS — Z720 Tobacco use: Secondary | ICD-10-CM

## 2016-12-17 DIAGNOSIS — R590 Localized enlarged lymph nodes: Secondary | ICD-10-CM | POA: Diagnosis not present

## 2016-12-17 DIAGNOSIS — R040 Epistaxis: Secondary | ICD-10-CM

## 2016-12-17 DIAGNOSIS — E119 Type 2 diabetes mellitus without complications: Secondary | ICD-10-CM

## 2016-12-17 DIAGNOSIS — Z794 Long term (current) use of insulin: Secondary | ICD-10-CM

## 2016-12-17 DIAGNOSIS — R8290 Unspecified abnormal findings in urine: Secondary | ICD-10-CM

## 2016-12-17 LAB — POCT GLYCOSYLATED HEMOGLOBIN (HGB A1C): HEMOGLOBIN A1C: 9

## 2016-12-17 MED ORDER — GLIMEPIRIDE 4 MG PO TABS
4.0000 mg | ORAL_TABLET | Freq: Two times a day (BID) | ORAL | 3 refills | Status: DC
Start: 2016-12-17 — End: 2017-10-24

## 2016-12-17 MED ORDER — NYSTATIN 100000 UNIT/GM EX CREA: 1.0000 "application " | TOPICAL_CREAM | Freq: Three times a day (TID) | CUTANEOUS | 1 refills | Status: DC

## 2016-12-17 MED ORDER — INSULIN GLARGINE 100 UNIT/ML SOLOSTAR PEN: 55.0000 [IU] | PEN_INJECTOR | Freq: Every day | SUBCUTANEOUS | 5 refills | Status: DC

## 2016-12-17 NOTE — Patient Instructions (Addendum)
Increase Glimepiride to 4mg  twice daily. Increase Lantus to 55 units at 10pm. Spray nasal saline into LEFT nose every day at bedtime.   IF you received an x-ray today, you will receive an invoice from [facility]. Please contact [facility] at [phone] with questions or concerns regarding your invoice.   IF you received labwork today, you will receive an invoice from [place]. Please contact [facility] at [phone] with questions or concerns regarding your invoice.   Our billing staff will not be able to assist you with questions regarding bills from these companies.  You will be contacted with the lab results as soon as they are available. The fastest way to get your results is to activate your My Chart account. Instructions are located on the last page of this paperwork. If you have not heard from [place] regarding the results in 2 weeks, please contact this office.      Diabetes Mellitus and Skin Care Diabetes (diabetes mellitus) can lead to health problems over time, including skin problems. People with diabetes have a higher risk for many types of skin complications. This is because having poorly controlled blood sugar (glucose) levels can:  Damage nerves and blood vessels. This can result in decreased feeling in your legs and feet, which means you may not notice minor skin injuries that could lead to serious problems.  Reduce blood flow (circulation), which makes wounds heal more slowly and increases your risk of infection.  Cause areas of skin to become thick or discolored.  What are some common skin conditions that affect people with diabetes? Diabetes often causes dry skin. It can also cause the skin on the feet to get thinner, break more easily, and heal more slowly. There are certain skin conditions that commonly affect people who have diabetes, such as:  Bacterial skin infections, such as styes, boils, infected hair follicles, and infections of the skin  around the nails.  Fungal skin infections. These are most common in areas where skin rubs together, such as in the armpits or under the breasts.  Open sores, especially on the feet.  Tissue death (gangrene). This can happen on your feet if a serious infection does not heal properly. Gangrene can cause the need for a foot or leg to be surgically removed (amputated).  Diabetes can also cause the skin to change. You may develop:  Dark, velvety markings on the skin that usually appear on the face, neck, armpits, inner thighs, and groin (acanthosis nigricans). This typically affects people of African-American and American-Indian descent.  Red, raised, scar-like tissue that may itch, feel painful, or develop into a wound (necrobiosis lipoidica).  Blisters on feet, toes, hands, or fingers.  Thickened, wax-like areas of skin that usually occur on the hands, forehead, or toes (digital sclerosis).  Brown or red ring-shaped or half-ring-shaped patches of skin on the ears or fingers (disseminated granuloma).  Pea-shaped yellow bumps that may be itchy and surrounded by a red ring (eruptive xanthomatosis). This usually affects the arms, feet, buttocks, and the top of the hands.  Round, discolored patches of tan skin that do not hurt or itch (diabetic dermopathy). These may look like age spots.  What do I need to know about itchy skin? It is common for people with diabetes to have itchy skin caused by dryness. Frequent high blood glucose levels can cause itchiness, and poor circulation and certain skin infections can make dry, itchy skin worse. If you have itchy skin that is red or covered in a  rash, this could be a sign of an allergic reaction to a medicine. If you have a rash or if your skin is very itchy, contact your health care provider. You may need help to manage your diabetes better, or you may need treatment for an infection. How can I prevent skin breakdown? When you have diabetes and you get  a badly infected ulcer or sore that does not heal, your skin can break down, especially if you have poor circulation or are on bed rest. To prevent skin breakdown:  Keep your skin clean and dry. Wash your skin often. Do not use hot water.  Do not use any products that contain nicotine or tobacco, such as cigarettes and e-cigarettes. Smoking affects the body's ability to heal. If you need help quitting, ask your health care provider.  Check your skin every day for cuts, bruises, redness, blisters, or sores, especially on your feet. Tell your health care provider about any cuts, wounds, or sores you have, especially if they are healing slowly.  If you are on bed rest, try to change positions often.  What else do I need to know about taking care of my skin?   To relieve dry skin and itching: ? Limit baths and showers to 5-10 minutes. ? Bathe with lukewarm water instead of hot water. ? Use mild soap and gentle skin cleansers. Do not use soap that is perfumed or harsh or dries your skin. ? Put on lotion as soon as you finish bathing.  Make sure that your health care provider performs a visual foot exam at every medical visit.  Schedule a foot exam with your health care provider once every year. This exam includes an inspection of the structure and skin of your feet.  If you get a skin injury, such as a cut, blister, or sore, check the area every day for signs of infection. Check for: ? More redness, swelling, or pain. ? More fluid or blood. ? Warmth. ? Pus or a bad smell. Contact a health care provider if:  You develop a cut or sore, especially on your feet.  You develop signs of infection after a skin injury.  Your blood glucose level is higher than 240 mg/dL (13.3 mmol/L) for 2 days in a row.  You have itchy skin that develops redness or a rash.  You have discolored areas of skin.  You have areas where your skin is changing, such as thickening or appearing shiny. This  information is not intended to replace advice given to you by your health care provider. Make sure you discuss any questions you have with your health care provider. Document Released: 06/14/2015 Document Revised: 07/22/2015 Document Reviewed: 06/14/2015 Elsevier Interactive Patient Education  2018 [employer].

## 2016-12-17 NOTE — Progress Notes (Signed)
Subjective:    Patient ID: [name], female    DOB: 17-Feb-1945, 71 y.o.   MRN: [id number]  12/17/2016  Diabetes (4 month follow-up )    HPI This 71 y.o. female presents for three month follow-up of DMII, hypertension, hypercholesterolemia.  Management changes made at last visit include: -uncontrolled DMII; increase Lantus to 50 units qhs for HgbA1c of 10.1. Glucose 198 at visit.  Cholesterol under excellent control.   -persistent dermatitis LEFT anterior shin; rx for hydrocortisone provided to apply bid. -s/p biopsy of thyroid nodule; pathology negative; repeat thyroid US in one year.  Seven siblings got together at Thanksgiving and was wonderful.  Really nice.   Sugars 126 this morning.  Ranges 80-130.    1:30pm 74 (rare)-200-300.  Before dinner: 77-300.  Lantus 50 units 10pm.  Taking Glimepiride '2mg'$  twice daily. Metformin '1000mg'$  bid.    Not check BP at home.    B:  Boiled egg, wheat toast, coffee. Yesterday, sausage patty, one egg, toast, grits, coffee cream and splenda. Snack:  None Lunch:  Skips; sandwich on wheat toast.  Tuna, diet coke, rare chips Supper: beef stew  Urine smells horrible.  Inner thighs have a rash; diaper rash intermittent; scaly.   Rash present after last visit.  L nare bleeding: intermittent; duration 1 minute.  Occurs twice in past three months.  L neck gland pain: onset five days ago; much improved; denies fever/chills/sweats.  Denies sore throat or ear pain; denies rhinorrhea or nasal congestion; denies teeth or gum issues.  Improved from onset.      BP Readings from Last 3 Encounters:  12/17/16 136/78  09/11/16 134/70  06/06/16 138/79   Wt Readings from Last 3 Encounters:  12/17/16 177 lb (80.3 kg)  09/11/16 172 lb (78 kg)  06/06/16 165 lb 9.6 oz (75.1 kg)   Immunization History  Administered Date(s) Administered  . Influenza Split 09/28/2011, 09/15/2012, 09/23/2013  . Influenza,inj,Quad PF,6+ Mos 09/21/2014, 09/11/2016  .  Influenza-Unspecified 09/29/2015  . Pneumococcal Conjugate-13 02/07/2015  . Pneumococcal Polysaccharide-23 12/20/2011  . Tdap 09/21/2014  . Zoster 04/17/2012    Review of Systems  Constitutional: Negative for chills, diaphoresis, fatigue and fever.  HENT: Positive for nosebleeds. Negative for congestion, dental problem, drooling, ear discharge, ear pain, facial swelling, mouth sores, postnasal drip, rhinorrhea, sinus pressure, sinus pain, sneezing, sore throat, trouble swallowing and voice change.   Eyes: Negative for visual disturbance.  Respiratory: Negative for cough and shortness of breath.   Cardiovascular: Negative for chest pain, palpitations and leg swelling.  Gastrointestinal: Negative for abdominal pain, constipation, diarrhea, nausea and vomiting.  Endocrine: Negative for cold intolerance, heat intolerance, polydipsia, polyphagia and polyuria.  Genitourinary: Negative for decreased urine volume, difficulty urinating, dyspareunia, dysuria, flank pain, frequency, hematuria, pelvic pain, urgency, vaginal bleeding, vaginal discharge and vaginal pain.  Skin: Positive for rash.  Neurological: Negative for dizziness, tremors, seizures, syncope, facial asymmetry, speech difficulty, weakness, light-headedness, numbness and headaches.  Hematological: Positive for adenopathy. Does not bruise/bleed easily.    Past Medical History:  Diagnosis Date  . COPD (chronic obstructive pulmonary disease) ([place])   . Diabetes mellitus   . Hyperlipidemia   . Hypertension   . Night sweats   . Rectal polyp    Past Surgical History:  Procedure Laterality Date  . ABDOMINAL HYSTERECTOMY  2000   fibroids; ovaries intact.  Marland Kitchen BACK SURGERY     plate put in back  . 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 TUBAL  LIGATION     No Known Allergies Current Outpatient Medications on File Prior to Visit  Medication Sig Dispense Refill  . Albuterol Sulfate (PROAIR RESPICLICK) 283 (90 Base) MCG/ACT  AEPB Inhale 2 Act into the lungs 4 (four) times daily as needed. 3 each 1  . aspirin 81 MG tablet Take 81 mg by mouth daily.      . blood glucose meter kit and supplies KIT Dispense based on patient and insurance preference. Use up to four times daily as directed. (FOR ICD-9 250.00, 250.01). 1 each 0  . Cholecalciferol (VITAMIN D3) 1000 UNITS CAPS Take 2 capsules by mouth every morning.     . fluticasone furoate-vilanterol (BREO ELLIPTA) 100-25 MCG/INH AEPB Inhale 1 puff into the lungs daily. 90 each 3  . hydrocortisone 2.5 % ointment Apply topically 2 (two) times daily. 30 g 0  . lisinopril (PRINIVIL,ZESTRIL) 10 MG tablet Take 1 tablet (10 mg total) by mouth daily. 90 tablet 3  . metFORMIN (GLUCOPHAGE) 1000 MG tablet Take 1 tablet (1,000 mg total) by mouth 2 (two) times daily with a meal. 180 tablet 3  . metoprolol succinate (TOPROL-XL) 25 MG 24 hr tablet Take 1 tablet (25 mg total) by mouth daily. 90 tablet 3  . Needles & Syringes MISC Insulin needles and syringes 100 each 3  . pravastatin (PRAVACHOL) 40 MG tablet Take 1 tablet by mouth  every evening after a meal 90 tablet 3  . tiotropium (SPIRIVA) 18 MCG inhalation capsule Place 1 capsule (18 mcg total) into inhaler and inhale daily. 90 capsule 3   No current facility-administered medications on file prior to visit.    Social History   Socioeconomic History  . Marital status: Divorced    Spouse name: Not on file  . Number of children: 3  . Years of education: Not on file  . Highest education level: Not on file  Social Needs  . Financial resource strain: Not on file  . Food insecurity - worry: Not on file  . Food insecurity - inability: Not on file  . Transportation needs - medical: Not on file  . Transportation needs - non-medical: Not on file  Occupational History  . Occupation: retired    Comment: [employer]  . Smoking status: Current Every Day Smoker    Packs/day: 1.50    Years: 50.00    Pack years:  75.00    Types: Cigarettes  . Smokeless tobacco: Never Used  Substance and Sexual Activity  . Alcohol use: No  . Drug use: No  . Sexual activity: Not on file  Other Topics Concern  . Not on file  Social History Narrative   Marital status: divorced; not dating in 2018 but interested slightly.        Children:  3 children; 7 seven grandchildren; 1 gg      Lives: with oldest daughter, 2 grandchildren, 56 gg, 1 friend of daughters, daughter's boyfriend.       Employment: retired [employer] age 79      Tobacco; 1ppd x 50 years      Alcohol: none      Exercise: a little in 2018; tries to walk for 10-15 minutes per day.      ADLs:   independent with ADLs; no transportation in 2018.      Advanced Directives:    none   Family History  Problem Relation Age of Onset  . Diabetes Sister   . Diabetes Brother   .  Hypertension Brother   . Cancer Mother 4       colon(age 68) and lung (age 65)  . Diabetes Mother   . Heart disease Mother 21       CABG  . Hyperlipidemia Mother   . Cancer Father 77       stomach  . Diabetes Maternal Grandmother   . Mental illness Sister        Objective:    BP 136/78   Pulse 96   Temp 98.6 F (37 C) (Oral)   Resp 16   Ht 5' 7.32" (1.71 m)   Wt 177 lb (80.3 kg)   SpO2 95%   BMI 27.46 kg/m  Physical Exam  Constitutional: She is oriented to person, place, and time. She appears well-developed and well-nourished. No distress.  HENT:  Head: Normocephalic and atraumatic.  Right Ear: External ear normal.  Left Ear: External ear normal.  Nose: Nose normal.  Mouth/Throat: Oropharynx is clear and moist.  Eyes: Conjunctivae and EOM are normal. Pupils are equal, round, and reactive to light.  Neck: Normal range of motion. Neck supple. Carotid bruit is not present. No thyromegaly present.  Cardiovascular: Normal rate, regular rhythm, normal heart sounds and intact distal pulses. Exam reveals no gallop and no friction rub.  No murmur  heard. Pulmonary/Chest: Effort normal and breath sounds normal. She has no wheezes. She has no rales.  Abdominal: Soft. Bowel sounds are normal. She exhibits no distension and no mass. There is no tenderness. There is no rebound and no guarding.  Genitourinary:     Genitourinary Comments: Hypertrophied scaling hyperpigmented rash medial thighs with distinct border; no satellite lesions.  Lymphadenopathy:       Head (right side): No submental, no submandibular, no tonsillar, no preauricular, no posterior auricular and no occipital adenopathy present.       Head (left side): No submental, no submandibular, no tonsillar, no preauricular and no occipital adenopathy present.    She has cervical adenopathy.       Right cervical: No superficial cervical, no deep cervical and no posterior cervical adenopathy present.      Left cervical: Superficial cervical adenopathy present. No posterior cervical adenopathy present.  L anterior cervical LAD; no enlargement.  Neurological: She is alert and oriented to person, place, and time. No cranial nerve deficit.  Skin: Skin is warm and dry. No rash noted. She is not diaphoretic. No erythema. No pallor.  Anterior shin LEFT with hypopigmentation yet no rash present.  Psychiatric: She has a normal mood and affect. Her behavior is normal.   No results found. Depression screen [facility] 2/9 12/17/2016 09/11/2016 06/06/2016 02/28/2016 08/31/2015  Decreased Interest 0 0 0 0 0  Down, Depressed, Hopeless 0 0 0 0 0  PHQ - 2 Score 0 0 0 0 0   Fall Risk  12/17/2016 09/11/2016 06/06/2016 02/28/2016 08/31/2015  Falls in the past year? No No Yes No No  Comment - - Fractured left arm - -  Number falls in past yr: - - 1 - -  Injury with Fall? - - Yes - -    Results for orders placed or performed in visit on 12/17/16  POCT glycosylated hemoglobin (Hb A1C)  Result Value Ref Range   Hemoglobin A1C 9.0        Assessment & Plan:   1. Type 2 diabetes mellitus without complication,  with long-term current use of insulin ([place])   2. Essential hypertension   3. Panlobular emphysema ([place])  4. Pure hypercholesterolemia   5. Tobacco user   6. Abnormal urine odor   7. Left-sided epistaxis   8. LAD (lymphadenopathy) of left cervical region   9. Candidiasis of urogenital sites     Uncontrolled diabetes mellitus type 2 yet improvement from last visit with hemoglobin A1c decreasing from 10-9.  Increase Lantus to 55 units every evening.  Increase glimepiride to 4 mg twice daily.  Controlled hypertension hypercholesterolemia.  Obtain labs for chronic disease management.  No changes.  Patient continues to smoke and suffers with COPD.  Continue Brio elliptical as well as Spiriva.  Patient continues to suffer with dyspnea on exertion.  Encourage smoking cessation.  New onset abnormal urine odor.  Obtain UA and urine culture.  No associated symptoms yet new onset per patient.  New onset left-sided epistaxis intermittently.  2 episodes of short duration in the past 3 months.  Upon exam, no acute abnormality appreciated other than white drainage present.  Recommend nasal saline spray to left nostril every night at bedtime.  New onset left cervical lymphadenopathy.  Onset 5 days ago with improvement since onset.  No focal abnormalities on exam.  Recommend supportive care.  Return to clinic if acutely worsens.  New onset candidiasis of urogenital region.  Secondary to hyperglycemia.  Prescription for nystatin cream 3 times daily for 2 weeks provided.  Orders Placed This Encounter  Procedures  . Urine Culture    Order Specific Question:   Source    Answer:   clean catch  . CBC with Differential/Platelet  . Comprehensive metabolic panel    Order Specific Question:   Has the patient fasted?    Answer:   No  . Urinalysis, dipstick only  . Urine Microscopic  . POCT glycosylated hemoglobin (Hb A1C)   Meds ordered this encounter  Medications  . glimepiride (AMARYL) 4 MG tablet     Sig: Take 1 tablet (4 mg total) by mouth 2 (two) times daily with a meal.    Dispense:  180 tablet    Refill:  3  . nystatin cream (MYCOSTATIN)    Sig: Apply 1 application topically 3 (three) times daily.    Dispense:  30 g    Refill:  1  . Insulin Glargine (LANTUS SOLOSTAR) 100 UNIT/ML Solostar Pen    Sig: Inject 55 Units into the skin daily at 10 pm.    Dispense:  30 pen    Refill:  5    DISPENSE: 3 MONTH SUPPLY    Return in about 4 months (around 04/17/2017) for follow-up chronic medical conditions.   Jacek Colson Elayne Guerin, M.D. Primary Care at [facility] [address] [place], [place]  [address] [phone] phone [phone] fax

## 2016-12-18 ENCOUNTER — Ambulatory Visit: Payer: Medicare Other | Admitting: Family Medicine

## 2016-12-18 LAB — COMPREHENSIVE METABOLIC PANEL
ALK PHOS: 132 IU/L — AB (ref 39–117)
ALT: 14 IU/L (ref 0–32)
AST: 14 IU/L (ref 0–40)
Albumin/Globulin Ratio: 1.4 (ref 1.2–2.2)
Albumin: 3.9 g/dL (ref 3.5–4.8)
BUN/Creatinine Ratio: 10 — ABNORMAL LOW (ref 12–28)
BUN: 10 mg/dL (ref 8–27)
Bilirubin Total: 0.3 mg/dL (ref 0.0–1.2)
CO2: 23 mmol/L (ref 20–29)
CREATININE: 0.98 mg/dL (ref 0.57–1.00)
Calcium: 9.6 mg/dL (ref 8.7–10.3)
Chloride: 102 mmol/L (ref 96–106)
GFR calc Af Amer: 67 mL/min/{1.73_m2} (ref >59)
GFR calc non Af Amer: 58 mL/min/{1.73_m2} — ABNORMAL LOW (ref >59)
GLOBULIN, TOTAL: 2.7 g/dL (ref 1.5–4.5)
GLUCOSE: 205 mg/dL — AB (ref 65–99)
Potassium: 4.8 mmol/L (ref 3.5–5.2)
SODIUM: 142 mmol/L (ref 134–144)
Total Protein: 6.6 g/dL (ref 6.0–8.5)

## 2016-12-18 LAB — CBC WITH DIFFERENTIAL/PLATELET
BASOS: 0 %
Basophils Absolute: 0 10*3/uL (ref 0.0–0.2)
EOS (ABSOLUTE): 0.1 10*3/uL (ref 0.0–0.4)
Eos: 1 %
Hematocrit: 44.4 % (ref 34.0–46.6)
Hemoglobin: 14.3 g/dL (ref 11.1–15.9)
IMMATURE GRANULOCYTES: 0 %
Immature Grans (Abs): 0 10*3/uL (ref 0.0–0.1)
LYMPHS ABS: 3 10*3/uL (ref 0.7–3.1)
Lymphs: 38 %
MCH: 27.9 pg (ref 26.6–33.0)
MCHC: 32.2 g/dL (ref 31.5–35.7)
MCV: 87 fL (ref 79–97)
MONOS ABS: 0.6 10*3/uL (ref 0.1–0.9)
Monocytes: 7 %
NEUTROS ABS: 4.3 10*3/uL (ref 1.4–7.0)
NEUTROS PCT: 54 %
PLATELETS: 327 10*3/uL (ref 150–379)
RBC: 5.12 x10E6/uL (ref 3.77–5.28)
RDW: 14.7 % (ref 12.3–15.4)
WBC: 8 10*3/uL (ref 3.4–10.8)

## 2016-12-18 LAB — URINALYSIS, DIPSTICK ONLY
BILIRUBIN UA: NEGATIVE
GLUCOSE, UA: NEGATIVE
Ketones, UA: NEGATIVE
Nitrite, UA: NEGATIVE
RBC UA: NEGATIVE
SPEC GRAV UA: 1.016 (ref 1.005–1.030)
Urobilinogen, Ur: 1 mg/dL (ref 0.2–1.0)
pH, UA: 5.5 (ref 5.0–7.5)

## 2016-12-18 LAB — URINALYSIS, MICROSCOPIC ONLY: Casts: NONE SEEN /lpf

## 2016-12-19 LAB — URINE CULTURE

## 2017-02-15 ENCOUNTER — Other Ambulatory Visit: Payer: Self-pay | Admitting: Family Medicine

## 2017-02-15 MED ORDER — CIPROFLOXACIN HCL 500 MG PO TABS: 500.0000 mg | ORAL_TABLET | Freq: Two times a day (BID) | ORAL | 0 refills | Status: DC

## 2017-03-11 ENCOUNTER — Ambulatory Visit (INDEPENDENT_AMBULATORY_CARE_PROVIDER_SITE_OTHER): Payer: Medicare Other | Admitting: Physician Assistant

## 2017-03-11 VITALS — BP 172/84 | HR 93 | Temp 97.9°F | Resp 16 | Ht 67.0 in | Wt 176.0 lb

## 2017-03-11 DIAGNOSIS — M79675 Pain in left toe(s): Secondary | ICD-10-CM

## 2017-03-11 DIAGNOSIS — R300 Dysuria: Secondary | ICD-10-CM

## 2017-03-11 LAB — POCT URINALYSIS DIP (MANUAL ENTRY)
BILIRUBIN UA: NEGATIVE
GLUCOSE UA: NEGATIVE mg/dL
LEUKOCYTES UA: NEGATIVE
NITRITE UA: NEGATIVE
Protein Ur, POC: 100 mg/dL — AB
RBC UA: NEGATIVE
Spec Grav, UA: 1.015 (ref 1.010–1.025)
Urobilinogen, UA: 0.2 E.U./dL
pH, UA: 5 (ref 5.0–8.0)

## 2017-03-11 MED ORDER — CEPHALEXIN 500 MG PO CAPS: 500.0000 mg | ORAL_CAPSULE | Freq: Two times a day (BID) | ORAL | 0 refills | Status: DC

## 2017-03-11 NOTE — Patient Instructions (Addendum)
If the pain returns, use a warm compress and take the keflex.  If this is not getting any better, you need to return.   Let dr. Tamala Julian know if you want to proceed with evaluation of the incontinence.   Probiotics What are probiotics? Probiotics are the good bacteria and yeasts that live in your body and keep you and your digestive system healthy. Probiotics also help your body's defense (immune) system and protect your body against bad bacterial growth. Certain foods contain probiotics, such as yogurt. Probiotics can also be purchased as a supplement. As with any supplement or drug, it is important to discuss its use with your health care provider. What affects the balance of bacteria in my body? The balance of bacteria in your body can be affected by:  Antibiotic medicines. Antibiotics are sometimes necessary to treat infection. Unfortunately, they may kill good or friendly bacteria in your body as well as the bad bacteria. This may lead to stomach problems like diarrhea, gas, and cramping.  Disease. Some conditions are the result of an overgrowth of bad bacteria, yeasts, parasites, or fungi. These conditions include: ? Infectious diarrhea. ? Stomach and respiratory infections. ? Skin infections. ? Irritable bowel syndrome (IBS). ? Inflammatory bowel diseases. ? Ulcer due to Helicobacter pylori (H. pylori) infection. ? Tooth decay and periodontal disease. ? Vaginal infections.  Stress and poor diet may also lower the good bacteria in your body. What type of probiotic is right for me? Probiotics are available over the counter at your local pharmacy, health food, or grocery store. They come in many different forms, combinations of strains, and dosing strengths. Some may need to be refrigerated. Always read the label for storage and usage instructions. Specific strains have been shown to be more effective for certain conditions. Ask your health care provider what option is best for you. Why  would I need probiotics? There are many reasons your health care provider might recommend a probiotic supplement, including:  Diarrhea.  Constipation.  IBS.  Respiratory infections.  Yeast infections.  Acne, eczema, and other skin conditions.  Frequent urinary tract infections (UTIs).  Are there side effects of probiotics? Some people experience mild side effects when taking probiotics. Side effects are usually temporary and may include:  Gas.  Bloating.  Cramping.  Rarely, serious side effects, such as infection or immune system changes, may occur. What else do I need to know about probiotics?  There are many different strains of probiotics. Certain strains may be more effective depending on your condition. Probiotics are available in varying doses. Ask your health care provider which probiotic you should use and how often.  If you are taking probiotics along with antibiotics, it is generally recommended to wait at least 2 hours between taking the antibiotic and taking the probiotic. For more information: [facility] for Complementary and Alternative Medicine [email or web address] This information is not intended to replace advice given to you by your health care provider. Make sure you discuss any questions you have with your health care provider. Document Released: 07/29/2013 Document Revised: 11/29/2015 Document Reviewed: 03/31/2013 Elsevier Interactive Patient Education  2017 [employer].      IF you received an x-ray today, you will receive an invoice from [facility]. Please contact [facility] at [phone] with questions or concerns regarding your invoice.   IF you received labwork today, you will receive an invoice from [facility]. Please contact [facility] at [phone] with questions or concerns regarding your invoice.   Our billing staff  will not be able to assist you with questions regarding bills from these companies.  You will be  contacted with the lab results as soon as they are available. The fastest way to get your results is to activate your My Chart account. Instructions are located on the last page of this paperwork. If you have not heard from [place] regarding the results in 2 weeks, please contact this office.

## 2017-03-11 NOTE — Progress Notes (Signed)
PRIMARY CARE AT [place], [place] [address] [phone]  Date:  03/11/2017   Name:  [name]   DOB:  08/16/1945   MRN:  [id number]  PCP:  Wardell Honour, MD    History of Present Illness:  [name] is a 72 y.o. female patient who presents to PCP with  Chief Complaint  Patient presents with         . odor in urine    x 1wk, pt was given  an antibactic last week, but feels the same     Patient reports toe pain at the left middle.  She notes that 1 week ago it was red and painful to the touch. She denies any trauma to the ffot.  No ill fitting foot wear.  She has not had heavy ambulation.  This has now resolved, but is darkened in that area.  She has a hx of diabetes, so with this hx she wanted to follow up on this.  Also reports abnormal odor to urine.  No dysuria, hematuria, but does have some frequency.    Patient Active Problem List   Diagnosis Date Noted  . Pure hypercholesterolemia 09/10/2016  . Thyroid nodule 09/10/2016  . COPD (chronic obstructive pulmonary disease) ([place]) 09/10/2016  . Family history of colon cancer in mother 06/06/2016  . Nuclear sclerotic cataract of both eyes 04/08/2013  . Type 2 diabetes mellitus without complication, with long-term current use of insulin ([place]) 09/13/2011  . HTN (hypertension) 09/13/2011  . Tobacco user 09/13/2011  . DDD (degenerative disc disease), lumbar 09/13/2011  . History of rectal polypectomy 08/31/2010    Past Medical History:  Diagnosis Date  . COPD (chronic obstructive pulmonary disease) ([place])   . Diabetes mellitus   . Hyperlipidemia   . Hypertension   . Night sweats   . Rectal polyp     Past Surgical History:  Procedure Laterality Date  . ABDOMINAL HYSTERECTOMY  2000   fibroids; ovaries intact.  Marland Kitchen BACK SURGERY     plate put in back  . 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 TUBAL LIGATION      Social History   Tobacco Use  . Smoking status: Current Every Day Smoker   Packs/day: 1.50    Years: 50.00    Pack years: 75.00    Types: Cigarettes  . Smokeless tobacco: Never Used  Substance Use Topics  . Alcohol use: No  . Drug use: No    Family History  Problem Relation Age of Onset  . Diabetes Sister   . Diabetes Brother   . Hypertension Brother   . Cancer Mother 66       colon(age 69) and lung (age 58)  . Diabetes Mother   . Heart disease Mother 17       CABG  . Hyperlipidemia Mother   . Cancer Father 40       stomach  . Diabetes Maternal Grandmother   . Mental illness Sister     No Known Allergies  Medication list has been reviewed and updated.  Current Outpatient Medications on File Prior to Visit  Medication Sig Dispense Refill  . Albuterol Sulfate (PROAIR RESPICLICK) 433 (90 Base) MCG/ACT AEPB Inhale 2 Act into the lungs 4 (four) times daily as needed. 3 each 1  . aspirin 81 MG tablet Take 81 mg by mouth daily.      . blood glucose meter kit and supplies KIT Dispense based on patient and  insurance preference. Use up to four times daily as directed. (FOR ICD-9 250.00, 250.01). 1 each 0  . Cholecalciferol (VITAMIN D3) 1000 UNITS CAPS Take 2 capsules by mouth every morning.     . ciprofloxacin (CIPRO) 500 MG tablet Take 1 tablet (500 mg total) by mouth 2 (two) times daily. 14 tablet 0  . fluticasone furoate-vilanterol (BREO ELLIPTA) 100-25 MCG/INH AEPB Inhale 1 puff into the lungs daily. 90 each 3  . glimepiride (AMARYL) 4 MG tablet Take 1 tablet (4 mg total) by mouth 2 (two) times daily with a meal. 180 tablet 3  . hydrocortisone 2.5 % ointment Apply topically 2 (two) times daily. 30 g 0  . Insulin Glargine (LANTUS SOLOSTAR) 100 UNIT/ML Solostar Pen Inject 55 Units into the skin daily at 10 pm. 30 pen 5  . lisinopril (PRINIVIL,ZESTRIL) 10 MG tablet Take 1 tablet (10 mg total) by mouth daily. 90 tablet 3  . metFORMIN (GLUCOPHAGE) 1000 MG tablet Take 1 tablet (1,000 mg total) by mouth 2 (two) times daily with a meal. 180 tablet 3  .  metoprolol succinate (TOPROL-XL) 25 MG 24 hr tablet Take 1 tablet (25 mg total) by mouth daily. 90 tablet 3  . Needles & Syringes MISC Insulin needles and syringes 100 each 3  . nystatin cream (MYCOSTATIN) Apply 1 application topically 3 (three) times daily. 30 g 1  . pravastatin (PRAVACHOL) 40 MG tablet Take 1 tablet by mouth  every evening after a meal 90 tablet 3  . tiotropium (SPIRIVA) 18 MCG inhalation capsule Place 1 capsule (18 mcg total) into inhaler and inhale daily. 90 capsule 3   No current facility-administered medications on file prior to visit.     ROS ROS otherwise unremarkable unless listed above.  Physical Examination: BP (!) 172/84   Pulse 93   Temp 97.9 F (36.6 C) (Oral)   Resp 16   Ht _0  (1.702 m)   Wt 176 lb (79.8 kg)   SpO2 96%   BMI 27.57 kg/m  Ideal Body Weight: Weight in (lb) to have BMI = 25: 159.3  Physical Exam  Constitutional: She is oriented to person, place, and time. She appears well-developed and well-nourished. No distress.  HENT:  Head: Normocephalic and atraumatic.  Right Ear: External ear normal.  Left Ear: External ear normal.  Eyes: Conjunctivae and EOM are normal. Pupils are equal, round, and reactive to light.  Cardiovascular: Normal rate.  Pulmonary/Chest: Effort normal. No respiratory distress.  Abdominal: There is no tenderness.  Musculoskeletal:  Distal digit medial hyperpigmented .5cm area wihtout erythema or tenderness.  Normal rom.    Neurological: She is alert and oriented to person, place, and time.  Skin: She is not diaphoretic.  Psychiatric: She has a normal mood and affect. Her behavior is normal.    Results for orders placed or performed in visit on 03/11/17  POCT urinalysis dipstick  Result Value Ref Range   Color, UA yellow yellow   Clarity, UA clear clear   Glucose, UA negative negative mg/dL   Bilirubin, UA negative negative   Ketones, POC UA trace (5) (A) negative mg/dL   Spec Grav, UA 1.015 1.010 - 1.025    Blood, UA negative negative   pH, UA 5.0 5.0 - 8.0   Protein Ur, POC =100 (A) negative mg/dL   Urobilinogen, UA 0.2 0.2 or 1.0 E.U./dL   Nitrite, UA Negative Negative   Leukocytes, UA Negative Negative    Assessment and Plan: Makita Blow is a 72  y.o. female who is here today for cc of  Chief Complaint  Patient presents with  . Back Pain    lower back/ x 1 wk  . odor in urine    x 1wk, pt was given  an antibactic last week, but feels the same  --will obtain culture.  Advised choice of keflex to start if the pain returns.  This is improving, and she chooses to watch this.  Rtc as needed. Pain of toe of left foot - Plan: cephALEXin (KEFLEX) 500 MG capsule  Dysuria - Plan: POCT urinalysis dipstick, Urine Culture  Ivar Drape, PA-C [facility] 3/1/20194:09 PM

## 2017-03-12 LAB — URINE CULTURE: ORGANISM ID, BACTERIA: NO GROWTH

## 2017-03-15 ENCOUNTER — Encounter: Payer: Self-pay | Admitting: Physician Assistant

## 2017-03-25 ENCOUNTER — Other Ambulatory Visit: Payer: Self-pay | Admitting: Family Medicine

## 2017-03-25 DIAGNOSIS — Z1231 Encounter for screening mammogram for malignant neoplasm of breast: Secondary | ICD-10-CM

## 2017-04-16 NOTE — Progress Notes (Signed)
Subjective:    Patient ID: [name], female    DOB: [birth year], 72 y.o.   MRN: [id number]  04/17/2017  Follow-up (Diabetes Type 2 follow up mangement) and Chest Pain (presenting chest pain, Nausea and vomiting)    HPI This 72 y.o. female presents for evaluation of chest pain, nausea, vomiting.  No body aches. ST and cough for two days.  Loose stools but no real diarrhea; no bloody stools or black stools.   Grandson with cough. Having back pain.  Intolerant to Metformin with diarrhea.   Sharp pain in chest during visit; steady pain; no pressure.   Lows in the morning most often.  Really gassy last night; passed a lot of gas last night.  Felt like gas pain.    BP Readings from Last 3 Encounters:  04/17/17 140/70  03/11/17 (!) 172/84  12/17/16 136/78   Wt Readings from Last 3 Encounters:  04/17/17 174 lb (78.9 kg)  03/11/17 176 lb (79.8 kg)  12/17/16 177 lb (80.3 kg)   Immunization History  Administered Date(s) Administered  . Influenza Split 09/28/2011, 09/15/2012, 09/23/2013  . Influenza,inj,Quad PF,6+ Mos 09/21/2014, 09/11/2016  . Influenza-Unspecified 09/29/2015  . Pneumococcal Conjugate-13 02/07/2015  . Pneumococcal Polysaccharide-23 12/20/2011  . Tdap 09/21/2014  . Zoster 04/17/2012    Review of Systems  Constitutional: Negative for chills, diaphoresis, fatigue and fever.  HENT: Negative for congestion, ear pain, postnasal drip, rhinorrhea, sinus pressure, sinus pain and sore throat.   Eyes: Negative for visual disturbance.  Respiratory: Negative for cough, shortness of breath, wheezing and stridor.   Cardiovascular: Positive for chest pain. Negative for palpitations and leg swelling.  Gastrointestinal: Positive for abdominal distention, diarrhea, nausea and vomiting. Negative for abdominal pain, anal bleeding, blood in stool, constipation and rectal pain.  Endocrine: Negative for cold intolerance, heat intolerance, polydipsia, polyphagia and polyuria.    Neurological: Negative for dizziness, tremors, seizures, syncope, facial asymmetry, speech difficulty, weakness, light-headedness, numbness and headaches.    Past Medical History:  Diagnosis Date  . COPD (chronic obstructive pulmonary disease) ([place])   . Diabetes mellitus   . Hyperlipidemia   . Hypertension   . Night sweats   . Rectal polyp    Past Surgical History:  Procedure Laterality Date  . ABDOMINAL HYSTERECTOMY  2000   fibroids; ovaries intact.  [facility] BACK SURGERY     plate put in back  . 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 TUBAL LIGATION     No Known Allergies Current Outpatient Medications on File Prior to Visit  Medication Sig Dispense Refill  . Albuterol Sulfate (PROAIR RESPICLICK) 109 (90 Base) MCG/ACT AEPB Inhale 2 Act into the lungs 4 (four) times daily as needed. 3 each 1  . aspirin 81 MG tablet Take 81 mg by mouth daily.      . blood glucose meter kit and supplies KIT Dispense based on patient and insurance preference. Use up to four times daily as directed. (FOR ICD-9 250.00, 250.01). 1 each 0  . Cholecalciferol (VITAMIN D3) 1000 UNITS CAPS Take 2 capsules by mouth every morning.     . ciprofloxacin (CIPRO) 500 MG tablet Take 1 tablet (500 mg total) by mouth 2 (two) times daily. 14 tablet 0  . fluticasone furoate-vilanterol (BREO ELLIPTA) 100-25 MCG/INH AEPB Inhale 1 puff into the lungs daily. 90 each 3  . glimepiride (AMARYL) 4 MG tablet Take 1 tablet (4 mg total) by mouth 2 (two) times daily with a meal. 180 tablet 3  .  hydrocortisone 2.5 % ointment Apply topically 2 (two) times daily. 30 g 0  . Insulin Glargine (LANTUS SOLOSTAR) 100 UNIT/ML Solostar Pen Inject 55 Units into the skin daily at 10 pm. 30 pen 5  . lisinopril (PRINIVIL,ZESTRIL) 10 MG tablet Take 1 tablet (10 mg total) by mouth daily. 90 tablet 3  . metFORMIN (GLUCOPHAGE) 1000 MG tablet Take 1 tablet (1,000 mg total) by mouth 2 (two) times daily with a meal. 180 tablet 3  . metoprolol  succinate (TOPROL-XL) 25 MG 24 hr tablet Take 1 tablet (25 mg total) by mouth daily. 90 tablet 3  . Needles & Syringes MISC Insulin needles and syringes 100 each 3  . nystatin cream (MYCOSTATIN) Apply 1 application topically 3 (three) times daily. 30 g 1  . pravastatin (PRAVACHOL) 40 MG tablet Take 1 tablet by mouth  every evening after a meal 90 tablet 3  . tiotropium (SPIRIVA) 18 MCG inhalation capsule Place 1 capsule (18 mcg total) into inhaler and inhale daily. 90 capsule 3   No current facility-administered medications on file prior to visit.    Social History   Socioeconomic History  . Marital status: Divorced    Spouse name: Not on file  . Number of children: 3  . Years of education: Not on file  . Highest education level: Not on file  Occupational History  . Occupation: retired    Comment: [employer]  Social Needs  . Financial resource strain: Not on file  . Food insecurity:    Worry: Not on file    Inability: Not on file  . Transportation needs:    Medical: Not on file    Non-medical: Not on file  Tobacco Use  . Smoking status: Current Every Day Smoker    Packs/day: 1.50    Years: 50.00    Pack years: 75.00    Types: Cigarettes  . Smokeless tobacco: Never Used  Substance and Sexual Activity  . Alcohol use: No  . Drug use: No  . Sexual activity: Not on file  Lifestyle  . Physical activity:    Days per week: Not on file    Minutes per session: Not on file  . Stress: Not on file  Relationships  . Social connections:    Talks on phone: Not on file    Gets together: Not on file    Attends religious service: Not on file    Active member of club or organization: Not on file    Attends meetings of clubs or organizations: Not on file    Relationship status: Not on file  . Intimate partner violence:    Fear of current or ex partner: Not on file    Emotionally abused: Not on file    Physically abused: Not on file    Forced sexual activity: Not on file    Other Topics Concern  . Not on file  Social History Narrative   Marital status: divorced; not dating in 2018 but interested slightly.        Children:  3 children; 7 seven grandchildren; 1 gg      Lives: with oldest daughter, 2 grandchildren, 22 gg, 1 friend of daughters, daughter's boyfriend.       Employment: retired [employer] age 73      Tobacco; 1ppd x 50 years      Alcohol: none      Exercise: a little in 2018; tries to walk for 10-15 minutes per day.  ADLs:   independent with ADLs; no transportation in 2018.      Advanced Directives:    none   Family History  Problem Relation Age of Onset  . Diabetes Sister   . Diabetes Brother   . Hypertension Brother   . Cancer Mother 50       colon(age 51) and lung (age 76)  . Diabetes Mother   . Heart disease Mother 40       CABG  . Hyperlipidemia Mother   . Cancer Father 38       stomach  . Diabetes Maternal Grandmother   . Mental illness Sister        Objective:    BP 140/70   Pulse 73   Ht '5\' 7"'$  (1.702 m)   Wt 174 lb (78.9 kg)   BMI 27.25 kg/m  Physical Exam  Constitutional: She is oriented to person, place, and time. She appears well-developed and well-nourished. She appears ill. She appears distressed.  Actively vomiting during exam.  HENT:  Head: Normocephalic and atraumatic.  Right Ear: External ear normal.  Left Ear: External ear normal.  Nose: Nose normal.  Mouth/Throat: Oropharynx is clear and moist.  Eyes: Pupils are equal, round, and reactive to light. Conjunctivae and EOM are normal.  Neck: Normal range of motion. Neck supple. Carotid bruit is not present. No thyromegaly present.  Cardiovascular: Normal rate, regular rhythm, normal heart sounds and intact distal pulses. Exam reveals no gallop and no friction rub.  No murmur heard. Pulmonary/Chest: Effort normal and breath sounds normal. She has no wheezes. She has no rales.  Abdominal: Soft. Bowel sounds are normal. She exhibits no distension  and no mass. There is no tenderness. There is no rebound and no guarding.  Lymphadenopathy:    She has no cervical adenopathy.  Neurological: She is alert and oriented to person, place, and time. No cranial nerve deficit.  Skin: Skin is warm and dry. No rash noted. She is not diaphoretic. No erythema. No pallor.  Psychiatric: She has a normal mood and affect. Her behavior is normal.   No results found. Depression screen [facility] 2/9 04/17/2017 12/17/2016 09/11/2016 06/06/2016 02/28/2016  Decreased Interest 0 0 0 0 0  Down, Depressed, Hopeless 0 0 0 0 0  PHQ - 2 Score 0 0 0 0 0   Fall Risk  04/17/2017 12/17/2016 09/11/2016 06/06/2016 02/28/2016  Falls in the past year? No No No Yes No  Comment - - - Fractured left arm -  Number falls in past yr: - - - 1 -  Injury with Fall? - - - Yes -    Results for orders placed or performed in visit on 04/17/17  POCT glycosylated hemoglobin (Hb A1C)  Result Value Ref Range   Hemoglobin A1C 8.5   POCT glucose (manual entry)  Result Value Ref Range   POC Glucose 244 (A) 70 - 99 mg/dl       Assessment & Plan:   1. Essential hypertension   2. Panlobular emphysema ([place])   3. Type 2 diabetes mellitus without complication, with long-term current use of insulin ([place])   4. Pure hypercholesterolemia   5. Chest pain, unspecified type   6. Non-intractable cyclical vomiting with nausea   7. Generalized abdominal pain     Chest pain: New onset.  Resolved with GI cocktail during office visit.  EKG obtained without acute process.  If recurrent, recommend presenting to emergency department.  Nausea and vomiting and diarrhea: New onset.  Active vomiting during office visit that responded well to Zofran 4 mg IM in office.  Chest pain also resolved with GI cocktail.  Recommend brat diet and gentle hydration at home.  Prescription for Zofran sent to pharmacy.  If Zofran not effective for controlling nausea and vomiting, recommend patient present to emergency department for IV  hydration and further antiemetics  Diabetes mellitus type 2: Uncontrolled currently with a glucose of 244 and hemoglobin A1c of 8.5.  Will have patient monitor closely after acute illness has resolved.  Orders Placed This Encounter  Procedures  . Urine Culture  . CBC with Differential/Platelet  . Comprehensive metabolic panel    Order Specific Question:   Has the patient fasted?    Answer:   No  . Lipid panel    Order Specific Question:   Has the patient fasted?    Answer:   No  . Lipase  . Amylase  . Troponin I  . POCT urinalysis dipstick  . POCT glycosylated hemoglobin (Hb A1C)  . POCT glucose (manual entry)  . EKG 12-Lead   Meds ordered this encounter  Medications  . ondansetron (ZOFRAN) injection 2 mg  . ondansetron (ZOFRAN-ODT) 8 MG disintegrating tablet    Sig: Take 1 tablet (8 mg total) by mouth every 8 (eight) hours as needed for nausea or vomiting.    Dispense:  20 tablet    Refill:  0    No follow-ups on file.   Marshae Azam Elayne Guerin, M.D. Primary Care at [facility] [address] [place], [place]  [address] [phone] phone [phone] fax

## 2017-04-17 ENCOUNTER — Telehealth: Payer: Self-pay | Admitting: Family Medicine

## 2017-04-17 ENCOUNTER — Other Ambulatory Visit: Payer: Self-pay | Admitting: Family Medicine

## 2017-04-17 ENCOUNTER — Encounter: Payer: Self-pay | Admitting: Family Medicine

## 2017-04-17 ENCOUNTER — Other Ambulatory Visit: Payer: Self-pay

## 2017-04-17 ENCOUNTER — Emergency Department (HOSPITAL_COMMUNITY): Payer: Medicare Other

## 2017-04-17 ENCOUNTER — Ambulatory Visit (INDEPENDENT_AMBULATORY_CARE_PROVIDER_SITE_OTHER): Payer: Medicare Other | Admitting: Family Medicine

## 2017-04-17 ENCOUNTER — Encounter (HOSPITAL_COMMUNITY): Payer: Self-pay

## 2017-04-17 VITALS — BP 140/70 | HR 73 | Ht 67.0 in | Wt 174.0 lb

## 2017-04-17 DIAGNOSIS — J449 Chronic obstructive pulmonary disease, unspecified: Secondary | ICD-10-CM | POA: Diagnosis not present

## 2017-04-17 DIAGNOSIS — E7800 Pure hypercholesterolemia, unspecified: Secondary | ICD-10-CM

## 2017-04-17 DIAGNOSIS — Z794 Long term (current) use of insulin: Secondary | ICD-10-CM | POA: Insufficient documentation

## 2017-04-17 DIAGNOSIS — E119 Type 2 diabetes mellitus without complications: Secondary | ICD-10-CM | POA: Insufficient documentation

## 2017-04-17 DIAGNOSIS — F17210 Nicotine dependence, cigarettes, uncomplicated: Secondary | ICD-10-CM | POA: Insufficient documentation

## 2017-04-17 DIAGNOSIS — I10 Essential (primary) hypertension: Secondary | ICD-10-CM | POA: Insufficient documentation

## 2017-04-17 DIAGNOSIS — K8070 Calculus of gallbladder and bile duct without cholecystitis without obstruction: Secondary | ICD-10-CM | POA: Diagnosis not present

## 2017-04-17 DIAGNOSIS — Z7982 Long term (current) use of aspirin: Secondary | ICD-10-CM | POA: Insufficient documentation

## 2017-04-17 DIAGNOSIS — Z79899 Other long term (current) drug therapy: Secondary | ICD-10-CM | POA: Diagnosis not present

## 2017-04-17 DIAGNOSIS — J431 Panlobular emphysema: Secondary | ICD-10-CM | POA: Diagnosis not present

## 2017-04-17 DIAGNOSIS — K8020 Calculus of gallbladder without cholecystitis without obstruction: Secondary | ICD-10-CM | POA: Diagnosis not present

## 2017-04-17 DIAGNOSIS — G43A0 Cyclical vomiting, not intractable: Secondary | ICD-10-CM | POA: Diagnosis not present

## 2017-04-17 DIAGNOSIS — R079 Chest pain, unspecified: Secondary | ICD-10-CM

## 2017-04-17 DIAGNOSIS — R1115 Cyclical vomiting syndrome unrelated to migraine: Secondary | ICD-10-CM

## 2017-04-17 DIAGNOSIS — R1084 Generalized abdominal pain: Secondary | ICD-10-CM

## 2017-04-17 DIAGNOSIS — R1013 Epigastric pain: Secondary | ICD-10-CM | POA: Diagnosis not present

## 2017-04-17 LAB — BASIC METABOLIC PANEL
Anion gap: 13 (ref 5–15)
BUN: 15 mg/dL (ref 6–20)
CALCIUM: 9.4 mg/dL (ref 8.9–10.3)
CO2: 22 mmol/L (ref 22–32)
CREATININE: 0.83 mg/dL (ref 0.44–1.00)
Chloride: 102 mmol/L (ref 101–111)
GFR calc Af Amer: >60 mL/min (ref >60)
GLUCOSE: 251 mg/dL — AB (ref 65–99)
Potassium: 4.5 mmol/L (ref 3.5–5.1)
Sodium: 137 mmol/L (ref 135–145)

## 2017-04-17 LAB — CBG MONITORING, ED: Glucose-Capillary: 271 mg/dL — ABNORMAL HIGH (ref 65–99)

## 2017-04-17 LAB — POCT URINALYSIS DIP (MANUAL ENTRY)
Bilirubin, UA: NEGATIVE
Blood, UA: NEGATIVE
LEUKOCYTES UA: NEGATIVE
Nitrite, UA: NEGATIVE
Spec Grav, UA: >=1.03 — AB (ref 1.010–1.025)
Urobilinogen, UA: 0.2 E.U./dL
pH, UA: 5 (ref 5.0–8.0)

## 2017-04-17 LAB — POCT GLYCOSYLATED HEMOGLOBIN (HGB A1C): Hemoglobin A1C: 8.5

## 2017-04-17 LAB — CBC
HCT: 47.9 % — ABNORMAL HIGH (ref 36.0–46.0)
Hemoglobin: 15.1 g/dL — ABNORMAL HIGH (ref 12.0–15.0)
MCH: 27.8 pg (ref 26.0–34.0)
MCHC: 31.5 g/dL (ref 30.0–36.0)
MCV: 88.2 fL (ref 78.0–100.0)
PLATELETS: 290 10*3/uL (ref 150–400)
RBC: 5.43 MIL/uL — ABNORMAL HIGH (ref 3.87–5.11)
RDW: 15 % (ref 11.5–15.5)
WBC: 15.6 10*3/uL — ABNORMAL HIGH (ref 4.0–10.5)

## 2017-04-17 LAB — TROPONIN I

## 2017-04-17 LAB — GLUCOSE, POCT (MANUAL RESULT ENTRY): POC Glucose: 244 mg/dl — AB (ref 70–99)

## 2017-04-17 LAB — I-STAT TROPONIN, ED: TROPONIN I, POC: 0 ng/mL (ref 0.00–0.08)

## 2017-04-17 MED ORDER — ONDANSETRON HCL 4 MG/2ML IJ SOLN
2.0000 mg | Freq: Once | INTRAMUSCULAR | Status: AC
  Administered 2017-04-17: 2 mg via INTRAMUSCULAR

## 2017-04-17 MED ORDER — ONDANSETRON 8 MG PO TBDP: 8.0000 mg | ORAL_TABLET | Freq: Three times a day (TID) | ORAL | 0 refills | Status: DC | PRN

## 2017-04-17 NOTE — Patient Instructions (Addendum)
  Zantac/ranitidine 150mg  one tablet daily. HOLD metformin and glimeperide.  IF you received an x-ray today, you will receive an invoice from [facility]. Please contact [facility] at [phone] with questions or concerns regarding your invoice.   IF you received labwork today, you will receive an invoice from [facility]. Please contact [facility] at [phone] with questions or concerns regarding your invoice.   Our billing staff will not be able to assist you with questions regarding bills from these companies.  You will be contacted with the lab results as soon as they are available. The fastest way to get your results is to activate your My Chart account. Instructions are located on the last page of this paperwork. If you have not heard from [place] regarding the results in 2 weeks, please contact this office.

## 2017-04-17 NOTE — ED Triage Notes (Signed)
Patient c/o mid chest pain that began this AM. Pain woke the patient up. Patient also c/o abdominal pain that began today and is progressively getting worse.patient also c/o N/V and loose stool today.

## 2017-04-17 NOTE — Telephone Encounter (Signed)
Joy:  Troponin I : less than 0.01- normal range

## 2017-04-18 ENCOUNTER — Emergency Department (HOSPITAL_COMMUNITY): Payer: Medicare Other

## 2017-04-18 ENCOUNTER — Emergency Department (HOSPITAL_COMMUNITY)
Admission: EM | Admit: 2017-04-18 | Discharge: 2017-04-18 | Disposition: A | Discharge status: Home / Self Care | Payer: Medicare Other | Attending: Emergency Medicine | Admitting: Emergency Medicine

## 2017-04-18 ENCOUNTER — Encounter (HOSPITAL_COMMUNITY): Payer: Self-pay

## 2017-04-18 DIAGNOSIS — K8020 Calculus of gallbladder without cholecystitis without obstruction: Secondary | ICD-10-CM

## 2017-04-18 DIAGNOSIS — K8050 Calculus of bile duct without cholangitis or cholecystitis without obstruction: Secondary | ICD-10-CM

## 2017-04-18 LAB — CBC WITH DIFFERENTIAL/PLATELET
Basophils Absolute: 0 10*3/uL (ref 0.0–0.1)
Basophils Absolute: 0 10*3/uL (ref 0.0–0.2)
Basophils Relative: 0 %
Basos: 0 %
EOS (ABSOLUTE): 0 10*3/uL (ref 0.0–0.4)
EOS: 0 %
Eosinophils Absolute: 0 10*3/uL (ref 0.0–0.7)
Eosinophils Relative: 0 %
HEMATOCRIT: 45 % (ref 34.0–46.6)
HEMATOCRIT: 46.6 % — AB (ref 36.0–46.0)
HEMOGLOBIN: 14.4 g/dL (ref 11.1–15.9)
HEMOGLOBIN: 15.3 g/dL — AB (ref 12.0–15.0)
Immature Grans (Abs): 0 10*3/uL (ref 0.0–0.1)
Immature Granulocytes: 0 %
LYMPHS ABS: 2.7 10*3/uL (ref 0.7–3.1)
LYMPHS PCT: 8 %
Lymphs Abs: 1.6 10*3/uL (ref 0.7–4.0)
Lymphs: 29 %
MCH: 27.5 pg (ref 26.6–33.0)
MCH: 28.7 pg (ref 26.0–34.0)
MCHC: 32 g/dL (ref 31.5–35.7)
MCHC: 32.8 g/dL (ref 30.0–36.0)
MCV: 86 fL (ref 79–97)
MCV: 87.4 fL (ref 78.0–100.0)
MONOCYTES: 3 %
MONOS ABS: 0.3 10*3/uL (ref 0.1–0.9)
MONOS PCT: 6 %
Monocytes Absolute: 1.1 10*3/uL — ABNORMAL HIGH (ref 0.1–1.0)
NEUTROS ABS: 17.2 10*3/uL — AB (ref 1.7–7.7)
NEUTROS ABS: 6.4 10*3/uL (ref 1.4–7.0)
NEUTROS PCT: 86 %
Neutrophils: 68 %
Platelets: 275 10*3/uL (ref 150–400)
Platelets: 278 10*3/uL (ref 150–379)
RBC: 5.33 MIL/uL — ABNORMAL HIGH (ref 3.87–5.11)
RBC: 5.23 x10E6/uL (ref 3.77–5.28)
RDW: 15.1 % (ref 11.5–15.5)
RDW: 14.9 % (ref 12.3–15.4)
WBC: 19.9 10*3/uL — ABNORMAL HIGH (ref 4.0–10.5)
WBC: 9.4 10*3/uL (ref 3.4–10.8)

## 2017-04-18 LAB — COMPREHENSIVE METABOLIC PANEL WITH GFR
ALT: 16 IU/L (ref 0–32)
AST: 15 IU/L (ref 0–40)
Albumin/Globulin Ratio: 1.3 (ref 1.2–2.2)
Albumin: 4 g/dL (ref 3.5–4.8)
Alkaline Phosphatase: 123 IU/L — ABNORMAL HIGH (ref 39–117)
BUN/Creatinine Ratio: 14 (ref 12–28)
BUN: 14 mg/dL (ref 8–27)
Bilirubin Total: 0.3 mg/dL (ref 0.0–1.2)
CO2: 21 mmol/L (ref 20–29)
Calcium: 9.6 mg/dL (ref 8.7–10.3)
Chloride: 101 mmol/L (ref 96–106)
Creatinine, Ser: 0.99 mg/dL (ref 0.57–1.00)
GFR calc Af Amer: 66 mL/min/1.73
GFR calc non Af Amer: 57 mL/min/1.73 — ABNORMAL LOW
Globulin, Total: 3 g/dL (ref 1.5–4.5)
Glucose: 244 mg/dL — ABNORMAL HIGH (ref 65–99)
Potassium: 5.1 mmol/L (ref 3.5–5.2)
Sodium: 141 mmol/L (ref 134–144)
Total Protein: 7 g/dL (ref 6.0–8.5)

## 2017-04-18 LAB — HEPATIC FUNCTION PANEL
ALBUMIN: 3.7 g/dL (ref 3.5–5.0)
ALK PHOS: 114 U/L (ref 38–126)
ALT: 18 U/L (ref 14–54)
AST: 22 U/L (ref 15–41)
Bilirubin, Direct: 0.3 mg/dL (ref 0.1–0.5)
Indirect Bilirubin: 0.8 mg/dL (ref 0.3–0.9)
Total Bilirubin: 1.1 mg/dL (ref 0.3–1.2)
Total Protein: 7.8 g/dL (ref 6.5–8.1)

## 2017-04-18 LAB — MICROALBUMIN / CREATININE URINE RATIO
Creatinine, Urine: 122 mg/dL
Microalb/Creat Ratio: 506.9 mg/g creat — ABNORMAL HIGH (ref 0.0–30.0)
Microalbumin, Urine: 618.4 ug/mL

## 2017-04-18 LAB — URINE CULTURE: Organism ID, Bacteria: NO GROWTH

## 2017-04-18 LAB — AMYLASE: AMYLASE: 141 U/L — AB (ref 31–124)

## 2017-04-18 LAB — LIPID PANEL
CHOLESTEROL TOTAL: 157 mg/dL (ref 100–199)
Chol/HDL Ratio: 3.3 ratio (ref 0.0–4.4)
HDL: 48 mg/dL (ref >39)
LDL CALC: 89 mg/dL (ref 0–99)
TRIGLYCERIDES: 100 mg/dL (ref 0–149)
VLDL Cholesterol Cal: 20 mg/dL (ref 5–40)

## 2017-04-18 LAB — I-STAT TROPONIN, ED: Troponin i, poc: 0 ng/mL (ref 0.00–0.08)

## 2017-04-18 LAB — LIPASE: Lipase: 72 U/L (ref 14–85)

## 2017-04-18 LAB — LIPASE, BLOOD: LIPASE: 46 U/L (ref 11–51)

## 2017-04-18 MED ORDER — SODIUM CHLORIDE 0.9 % IV SOLN
2.0000 g | Freq: Once | INTRAVENOUS | Status: AC
  Administered 2017-04-18: 2 g via INTRAVENOUS
  Filled 2017-04-18: qty 20

## 2017-04-18 MED ORDER — MORPHINE SULFATE (PF) 4 MG/ML IV SOLN
4.0000 mg | Freq: Once | INTRAVENOUS | Status: AC
  Administered 2017-04-18: 4 mg via INTRAVENOUS
  Filled 2017-04-18: qty 1

## 2017-04-18 MED ORDER — OXYCODONE-ACETAMINOPHEN 5-325 MG PO TABS: 1.0000 | ORAL_TABLET | ORAL | 0 refills | Status: DC | PRN

## 2017-04-18 MED ORDER — SODIUM CHLORIDE 0.9 % IV BOLUS
1000.0000 mL | Freq: Once | INTRAVENOUS | Status: AC
  Administered 2017-04-18: 1000 mL via INTRAVENOUS

## 2017-04-18 MED ORDER — OXYCODONE-ACETAMINOPHEN 5-325 MG PO TABS: 1.0000 | ORAL_TABLET | Freq: Once | ORAL | Status: DC

## 2017-04-18 MED ORDER — ONDANSETRON HCL 4 MG PO TABS: 4.0000 mg | ORAL_TABLET | Freq: Four times a day (QID) | ORAL | 0 refills | Status: DC | PRN

## 2017-04-18 MED ORDER — IOPAMIDOL (ISOVUE-300) INJECTION 61%
100.0000 mL | Freq: Once | INTRAVENOUS | Status: AC | PRN
  Administered 2017-04-18: 100 mL via INTRAVENOUS

## 2017-04-18 MED ORDER — IOPAMIDOL (ISOVUE-300) INJECTION 61%
INTRAVENOUS | Status: AC
  Filled 2017-04-18: qty 100

## 2017-04-18 MED ORDER — ONDANSETRON HCL 4 MG/2ML IJ SOLN
4.0000 mg | Freq: Once | INTRAMUSCULAR | Status: AC
  Administered 2017-04-18: 4 mg via INTRAVENOUS
  Filled 2017-04-18: qty 2

## 2017-04-18 NOTE — Discharge Instructions (Addendum)
Stay on a low fat diet.  Return if pain is not being adequately controlled, you are vomiting, or if you start running a fever.

## 2017-04-18 NOTE — Telephone Encounter (Signed)
Patient is in ED 04/18/2017

## 2017-04-18 NOTE — ED Notes (Signed)
EKG was clicked off in triage, but no EKG was exported to the patient's chart. Tech came in after patient was brought back to room and stated the EKG was already done. While looking at the patients chart I found that none can be found, so an EKG was now completed and handed to Dr. Roxanne Mins directly. Patient has been on monitor since coming back from triage.

## 2017-04-18 NOTE — ED Provider Notes (Signed)
[facility] DEPT Provider Note   CSN: [id number] Arrival date & time: 04/17/17  1810     History   Chief Complaint Chief Complaint  Patient presents with  . Chest Pain  . Abdominal Pain    HPI [name] is a 72 y.o. female.  The history is provided by the patient.  She has history of hypertension, diabetes, hyperlipidemia, COPD and comes in with epigastric pain since this morning.  Pain radiates throughout her abdomen, but not to the back or shoulders.  She had seen her primary care provider and had an ECG done.  She was given an injection for nausea and a GI cocktail which gave some temporary relief, and sent home with a prescription for ranitidine and ondansetron.  Symptoms have persisted through the day.  There has been nausea and vomiting and she has not been able to hold the medication down.  She rates pain at 7/10.  It is better if she lays very still, worse with any movement or palpation.  Past Medical History:  Diagnosis Date  . COPD (chronic obstructive pulmonary disease) ([place])   . Diabetes mellitus   . Hyperlipidemia   . Hypertension   . Night sweats   . Rectal polyp     Patient Active Problem List   Diagnosis Date Noted  . Pure hypercholesterolemia 09/10/2016  . Thyroid nodule 09/10/2016  . COPD (chronic obstructive pulmonary disease) ([place]) 09/10/2016  . Family history of colon cancer in mother 06/06/2016  . Nuclear sclerotic cataract of both eyes 04/08/2013  . Type 2 diabetes mellitus without complication, with long-term current use of insulin ([place]) 09/13/2011  . HTN (hypertension) 09/13/2011  . Tobacco user 09/13/2011  . DDD (degenerative disc disease), lumbar 09/13/2011  . History of rectal polypectomy 08/31/2010    Past Surgical History:  Procedure Laterality Date  . ABDOMINAL HYSTERECTOMY  2000   fibroids; ovaries intact.  [facility] BACK SURGERY     plate put in back  . 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 TUBAL  LIGATION       OB History   None      Home Medications    Prior to Admission medications   Medication Sig Start Date End Date Taking? Authorizing Provider  Albuterol Sulfate (PROAIR RESPICLICK) 829 (90 Base) MCG/ACT AEPB Inhale 2 Act into the lungs 4 (four) times daily as needed. 08/31/15   Wardell Honour, MD  aspirin 81 MG tablet Take 81 mg by mouth daily.      [provider]  blood glucose meter kit and supplies KIT Dispense based on patient and insurance preference. Use up to four times daily as directed. (FOR ICD-9 250.00, 250.01). 02/17/15   Wardell Honour, MD  Cholecalciferol (VITAMIN D3) 1000 UNITS CAPS Take 2 capsules by mouth every morning.     [provider]  ciprofloxacin (CIPRO) 500 MG tablet Take 1 tablet (500 mg total) by mouth 2 (two) times daily. 02/15/17   Wardell Honour, MD  fluticasone furoate-vilanterol (BREO ELLIPTA) 100-25 MCG/INH AEPB Inhale 1 puff into the lungs daily. 06/06/16   Wardell Honour, MD  glimepiride (AMARYL) 4 MG tablet Take 1 tablet (4 mg total) by mouth 2 (two) times daily with a meal. 12/17/16   Wardell Honour, MD  hydrocortisone 2.5 % ointment Apply topically 2 (two) times daily. 09/11/16   Wardell Honour, MD  Insulin Glargine (LANTUS SOLOSTAR) 100 UNIT/ML Solostar Pen Inject 55 Units into  the skin daily at 10 pm. 12/17/16   Wardell Honour, MD  lisinopril (PRINIVIL,ZESTRIL) 10 MG tablet Take 1 tablet (10 mg total) by mouth daily. 06/06/16   Wardell Honour, MD  metFORMIN (GLUCOPHAGE) 1000 MG tablet Take 1 tablet (1,000 mg total) by mouth 2 (two) times daily with a meal. 06/06/16   Wardell Honour, MD  metoprolol succinate (TOPROL-XL) 25 MG 24 hr tablet Take 1 tablet (25 mg total) by mouth daily. 06/06/16   Wardell Honour, MD  Needles & Syringes MISC Insulin needles and syringes 06/06/16   Wardell Honour, MD  nystatin cream (MYCOSTATIN) Apply 1 application topically 3 (three) times daily. 12/17/16   Wardell Honour, MD  ondansetron  (ZOFRAN-ODT) 8 MG disintegrating tablet Take 1 tablet (8 mg total) by mouth every 8 (eight) hours as needed for nausea or vomiting. 04/17/17   Wardell Honour, MD  pravastatin (PRAVACHOL) 40 MG tablet Take 1 tablet by mouth  every evening after a meal 06/06/16   Wardell Honour, MD  tiotropium (SPIRIVA) 18 MCG inhalation capsule Place 1 capsule (18 mcg total) into inhaler and inhale daily. 06/06/16   Wardell Honour, MD    Family History Family History  Problem Relation Age of Onset  . Diabetes Sister   . Diabetes Brother   . Hypertension Brother   . Cancer Mother 53       colon(age 11) and lung (age 71)  . Diabetes Mother   . Heart disease Mother 33       CABG  . Hyperlipidemia Mother   . Cancer Father 10       stomach  . Diabetes Maternal Grandmother   . Mental illness Sister     Social History Social History   Tobacco Use  . Smoking status: Current Every Day Smoker    Packs/day: 0.50    Years: 50.00    Pack years: 25.00    Types: Cigarettes  . Smokeless tobacco: Never Used  Substance Use Topics  . Alcohol use: No  . Drug use: No     Allergies   Patient has no known allergies.   Review of Systems Review of Systems  All other systems reviewed and are negative.    Physical Exam Updated Vital Signs BP (!) 141/86 (BP Location: Right Arm)   Pulse (!) 103   Temp 98.2 F (36.8 C) (Oral)   Resp 19   Ht '5\' 7"'$  (1.702 m)   Wt 78.9 kg (174 lb)   SpO2 100%   BMI 27.25 kg/m   Physical Exam  Nursing note and vitals reviewed.  72 year old female, resting comfortably and in no acute distress. Vital signs are significant for borderline elevated systolic blood pressure, borderline tachycardia. Oxygen saturation is 100%, which is normal. Head is normocephalic and atraumatic. PERRLA, EOMI. Oropharynx is clear. Neck is nontender and supple without adenopathy or JVD. Back is nontender and there is no CVA tenderness. Lungs are clear without rales, wheezes, or  rhonchi. Chest is nontender. Heart has regular rate and rhythm without murmur. Abdomen is soft, flat, with moderate epigastric tenderness.  There is also mild to moderate tenderness in the periumbilical area and lower abdomen.  There is no rebound or guarding.  There are no masses or hepatosplenomegaly and peristalsis is hypoactive. Extremities have no cyanosis or edema, full range of motion is present. Skin is warm and dry without rash. Neurologic: Mental status is normal, cranial nerves are intact, there are  no motor or sensory deficits.  ED Treatments / Results  Labs (all labs ordered are listed, but only abnormal results are displayed) Labs Reviewed  CBC - Abnormal; Notable for the following components:      Result Value   WBC 15.6 (*)    RBC 5.43 (*)    Hemoglobin 15.1 (*)    HCT 47.9 (*)    All other components within normal limits  BASIC METABOLIC PANEL - Abnormal; Notable for the following components:   Glucose, Bld 251 (*)    All other components within normal limits  CBC WITH DIFFERENTIAL/PLATELET - Abnormal; Notable for the following components:   WBC 19.9 (*)    RBC 5.33 (*)    Hemoglobin 15.3 (*)    HCT 46.6 (*)    Neutro Abs 17.2 (*)    Monocytes Absolute 1.1 (*)    All other components within normal limits  CBG MONITORING, ED - Abnormal; Notable for the following components:   Glucose-Capillary 271 (*)    All other components within normal limits  HEPATIC FUNCTION PANEL  LIPASE, BLOOD  I-STAT TROPONIN, ED  I-STAT TROPONIN, ED    EKG EKG Interpretation  Date/Time:  Thursday April 18 2017 04:11:17 EDT Ventricular Rate:  114 PR Interval:    QRS Duration: 86 QT Interval:  332 QTC Calculation: 458 R Axis:   6 Text Interpretation:  Sinus tachycardia Consider right atrial enlargement Otherwise within normal limits When compared with ECG of 08/01/2010, No significant change was found Confirmed by Delora Fuel ([id number]) on 04/18/2017 4:13:31 AM   Radiology Dg  Chest 2 View  Result Date: 04/17/2017 CLINICAL DATA:  Chest pain. EXAM: CHEST - 2 VIEW COMPARISON:  Radiographs of May 31, 2015. FINDINGS: The heart size and mediastinal contours are within normal limits. Both lungs are clear. No pneumothorax or pleural effusion is noted. Atherosclerosis of thoracic aorta is noted. The visualized skeletal structures are unremarkable. IMPRESSION: No active cardiopulmonary disease. Aortic Atherosclerosis (ICD10-I70.0). Electronically Signed   By: Marijo Conception, M.D.   On: 04/17/2017 19:35   Ct Abdomen Pelvis W Contrast  Result Date: 04/18/2017 CLINICAL DATA:  Abdominal pain with nausea and vomiting EXAM: CT ABDOMEN AND PELVIS WITH CONTRAST TECHNIQUE: Multidetector CT imaging of the abdomen and pelvis was performed using the standard protocol following bolus administration of intravenous contrast. CONTRAST:  18m ISOVUE-300 IOPAMIDOL (ISOVUE-300) INJECTION 61% COMPARISON:  CT abdomen pelvis 05/31/2015 FINDINGS: LOWER CHEST: No basilar pulmonary nodules or pleural effusion. No apical pericardial effusion. HEPATOBILIARY: Normal hepatic contours and density. No intra- or extrahepatic biliary dilatation. There is cholelithiasis with mild gallbladder wall thickening. No abnormal enhancement in the gallbladder fossa. The visible gallstone is at the gallbladder neck. PANCREAS: Normal parenchymal contours without ductal dilatation. No peripancreatic fluid collection. SPLEEN: Normal. ADRENALS/URINARY TRACT: --Adrenal glands: Normal. --Right kidney/ureter: No hydronephrosis, nephroureterolithiasis, perinephric stranding or solid renal mass. --Left kidney/ureter: There is an interpolar cyst measuring 2.4 cm. No hydronephrosis, nephroureterolithiasis, perinephric stranding or solid renal mass. --Urinary bladder: Normal for degree of distention STOMACH/BOWEL: --Stomach/Duodenum: No hiatal hernia or other gastric abnormality. Normal duodenal course. --Small bowel: No dilatation or  inflammation. --Colon: No focal abnormality. --Appendix: Normal. VASCULAR/LYMPHATIC: Atherosclerotic calcification is present within the non-aneurysmal abdominal aorta, without hemodynamically significant stenosis. The portal vein, splenic vein, superior mesenteric vein and IVC are patent. No abdominal or pelvic lymphadenopathy. REPRODUCTIVE: Increased size of right ovarian cyst, now 3.2 cm. There is mural calcification, unchanged. Normal left ovary. Status post hysterectomy. MUSCULOSKELETAL. No bony  spinal canal stenosis or focal osseous abnormality. OTHER: None. IMPRESSION: 1. Cholelithiasis with stone at the gallbladder neck with mild gallbladder dilatation and wall thickening. These findings are compatible with, though not diagnostic of, acute cholecystitis. Dedicated right upper quadrant ultrasound may be helpful for further characterization. 2. Increased size of right ovarian cyst with mural calcification, now measuring 3.2 cm. ACR guidelines indicate that non emergent pelvic ultrasound should be obtained for further characterization. Source: White Paper: Managing Incidental Findings on Abdominal and Pelvic CT and MRI, Part 1: Adnexal Findings, JACR Sept. 2013 3.  Aortic Atherosclerosis (ICD10-I70.0). Electronically Signed   By: Ulyses Jarred M.D.   On: 04/18/2017 01:56   US Abdomen Limited  Result Date: 04/18/2017 CLINICAL DATA:  Cholelithiasis on CT. EXAM: ULTRASOUND ABDOMEN LIMITED RIGHT UPPER QUADRANT COMPARISON:  CT abdomen and pelvis 04/18/2017 FINDINGS: Gallbladder: Gallbladder sludge and tiny stones layering in the gallbladder. Gallbladder wall thickness measures about 2.4 mm. No wall thickening or edema is identified. Murphy's sign is negative. Common bile duct: Diameter: 5.1 mm, normal Liver: Diffusely increased parenchymal echotexture consistent with fatty infiltration. No focal lesions. Portal vein is patent on color Doppler imaging with normal direction of blood flow towards the liver.  IMPRESSION: Cholelithiasis and gallbladder sludge. No additional changes to suggest cholecystitis. Diffuse fatty infiltration of the liver. Electronically Signed   By: Lucienne Capers M.D.   On: 04/18/2017 04:57    Procedures Procedures   Medications Ordered in ED Medications  sodium chloride 0.9 % bolus 1,000 mL (has no administration in time range)  ondansetron (ZOFRAN) injection 4 mg (has no administration in time range)  morphine 4 MG/ML injection 4 mg (has no administration in time range)     Initial Impression / Assessment and Plan / ED Course  I have reviewed the triage vital signs and the nursing notes.  Pertinent labs & imaging results that were available during my care of the patient were reviewed by me and considered in my medical decision making (see chart for details).  Epigastric and abdominal pain of uncertain cause.  Pattern is not suggestive of coronary artery disease.  I reviewed ECG from her PCPs office and that was normal.  We will repeat ECG.  Troponin is normal here, and will be repeated.  WBC is moderately elevated.  Electrolytes and renal function are normal.  Will check hepatic function panel and lipase and sent for CT of abdomen and pelvis.  Old records are reviewed confirming office visit with complaint of abdominal/chest pain earlier today, CT of abdomen May 31, 2015 showed cholelithiasis.  It is possible that her symptoms are from cholecystitis.  She is given IV fluids, morphine, ondansetron.  CT of abdomen shows cholelithiasis and concern for possible cholecystitis.  Ultrasound is obtained showing thickened gallbladder wall but no pericholecystic fluid and not indicative of cholecystitis.  Leukocytosis is noted, but patient is nontoxic in appearance and this is felt to be a stress reaction and not indicative of active infection.  She had adequate pain relief with morphine and adequate nausea relief with ondansetron.  She is referred to general surgery for  follow-up.  Discharged with prescription for oxycodone-acetaminophen and ondansetron.  Return precautions discussed.  Final Clinical Impressions(s) / ED Diagnoses   Final diagnoses:  Biliary colic  Calculus of gallbladder without cholecystitis without obstruction    ED Discharge Orders        Ordered    oxyCODONE-acetaminophen (PERCOCET) 5-325 MG tablet  Every 4 hours PRN  04/18/17 0519    ondansetron (ZOFRAN) 4 MG tablet  Every 6 hours PRN     63/14/97 0263       Delora Fuel, MD 78/58/85 [phone]

## 2017-04-24 ENCOUNTER — Encounter: Payer: Self-pay | Admitting: Physician Assistant

## 2017-04-25 ENCOUNTER — Ambulatory Visit: Payer: Self-pay | Admitting: Surgery

## 2017-04-25 ENCOUNTER — Ambulatory Visit
Admission: RE | Admit: 2017-04-25 | Discharge: 2017-04-25 | Disposition: A | Discharge status: Home / Self Care | Payer: Medicare Other | Source: Ambulatory Visit | Attending: Family Medicine | Admitting: Family Medicine

## 2017-04-25 DIAGNOSIS — K8010 Calculus of gallbladder with chronic cholecystitis without obstruction: Secondary | ICD-10-CM | POA: Diagnosis not present

## 2017-04-25 DIAGNOSIS — Z1231 Encounter for screening mammogram for malignant neoplasm of breast: Secondary | ICD-10-CM

## 2017-04-25 NOTE — H&P (View-Only) (Signed)
History of Present Illness [name]. Colleen Laws MD; 04/25/2017 8:05 PM) The patient is a 72 year old female who presents for evaluation of gall stones. PCP - [name], MD Reason: symptomatic cholelithiasis  This is a 72 yo female with COPD, DM, HTN, hyperlipidemia who presents after ED visit on 04/18/17. She developed acute epigastric pain that radiated throughout her abdomen. She had nausea, vomiting, and some diarrhea. Cardiac etiology was ruled out, but the pain worsened. She was evaluated in the ED and was found to have gallstones but no sign of cholecystitis. LFT's WNL. WBC was elevated at 19.9. Since discharge from the ED, she has had mild RUQ abdominal pain and occasional nausea. She presents now to discuss cholecystectomy.   CLINICAL DATA: Abdominal pain with nausea and vomiting  EXAM: CT ABDOMEN AND PELVIS WITH CONTRAST  TECHNIQUE: Multidetector CT imaging of the abdomen and pelvis was performed using the standard protocol following bolus administration of intravenous contrast.  CONTRAST: 128mL ISOVUE-300 IOPAMIDOL (ISOVUE-300) INJECTION 61%  COMPARISON: CT abdomen pelvis 05/31/2015  FINDINGS: LOWER CHEST: No basilar pulmonary nodules or pleural effusion. No apical pericardial effusion.  HEPATOBILIARY: Normal hepatic contours and density. No intra- or extrahepatic biliary dilatation. There is cholelithiasis with mild gallbladder wall thickening. No abnormal enhancement in the gallbladder fossa. The visible gallstone is at the gallbladder neck.  PANCREAS: Normal parenchymal contours without ductal dilatation. No peripancreatic fluid collection.  SPLEEN: Normal.  ADRENALS/URINARY TRACT:  --Adrenal glands: Normal.  --Right kidney/ureter: No hydronephrosis, nephroureterolithiasis, perinephric stranding or solid renal mass.  --Left kidney/ureter: There is an interpolar cyst measuring 2.4 cm. No hydronephrosis, nephroureterolithiasis, perinephric  stranding or solid renal mass.  --Urinary bladder: Normal for degree of distention  STOMACH/BOWEL:  --Stomach/Duodenum: No hiatal hernia or other gastric abnormality. Normal duodenal course.  --Small bowel: No dilatation or inflammation.  --Colon: No focal abnormality.  --Appendix: Normal.  VASCULAR/LYMPHATIC: Atherosclerotic calcification is present within the non-aneurysmal abdominal aorta, without hemodynamically significant stenosis. The portal vein, splenic vein, superior mesenteric vein and IVC are patent. No abdominal or pelvic lymphadenopathy.  REPRODUCTIVE: Increased size of right ovarian cyst, now 3.2 cm. There is mural calcification, unchanged. Normal left ovary. Status post hysterectomy.  MUSCULOSKELETAL. No bony spinal canal stenosis or focal osseous abnormality.  OTHER: None.  IMPRESSION: 1. Cholelithiasis with stone at the gallbladder neck with mild gallbladder dilatation and wall thickening. These findings are compatible with, though not diagnostic of, acute cholecystitis. Dedicated right upper quadrant ultrasound may be helpful for further characterization. 2. Increased size of right ovarian cyst with mural calcification, now measuring 3.2 cm. ACR guidelines indicate that non emergent pelvic ultrasound should be obtained for further characterization. Source: White Paper: Managing Incidental Findings on Abdominal and Pelvic CT and MRI, Part 1: Adnexal Findings, JACR Sept. 2013 3. Aortic Atherosclerosis (ICD10-I70.0).   Electronically Signed By: Ulyses Jarred M.D. On: 04/18/2017 01:5 CLINICAL DATA: Cholelithiasis on CT.  EXAM: ULTRASOUND ABDOMEN LIMITED RIGHT UPPER QUADRANT  COMPARISON: CT abdomen and pelvis 04/18/2017  FINDINGS: Gallbladder:  Gallbladder sludge and tiny stones layering in the gallbladder. Gallbladder wall thickness measures about 2.4 mm. No wall thickening or edema is identified. Murphy's sign is  negative.  Common bile duct:  Diameter: 5.1 mm, normal  Liver:  Diffusely increased parenchymal echotexture consistent with fatty infiltration. No focal lesions. Portal vein is patent on color Doppler imaging with normal direction of blood flow towards the liver.  IMPRESSION: Cholelithiasis and gallbladder sludge. No additional changes to suggest cholecystitis. Diffuse fatty infiltration of the  liver.   Electronically Signed By: Lucienne Capers M.D. On: 04/18/2017 04:57   Past Surgical History ([name], [place]; 04/25/2017 2:15 PM) Colon Polyp Removal - Colonoscopy Colon Polyp Removal - Open Hysterectomy (not due to cancer) - Partial Spinal Surgery - Lower Back Tonsillectomy  Diagnostic Studies History ([name], [place]; 04/25/2017 2:15 PM) Colonoscopy 1-5 years ago Mammogram within last year Pap Smear >5 years ago  Allergies ([name], [place]; 04/25/2017 2:16 PM) No Known Drug Allergies [04/25/2017]: Allergies Reconciled  Medication History ([name], [place]; 04/25/2017 2:16 PM) Adair Patter (100-25MCG/INH Aero Pow Br Act, Inhalation) Active. Spiriva HandiHaler (18MCG Capsule, Inhalation) Active. Glimepiride (4MG  Tablet, Oral) Active. Lantus SoloStar (100UNIT/ML Soln Pen-inj, Subcutaneous) Active. Lisinopril (10MG  Tablet, Oral) Active. MetFORMIN HCl (1000MG  Tablet, Oral) Active. Metoprolol Succinate ER (25MG  Tablet ER 24HR, Oral) Active. Ondansetron HCl (4MG  Tablet, Oral) Active. Pravastatin Sodium (40MG  Tablet, Oral) Active. Aspirin (81MG  Tablet, Oral) Active. Medications Reconciled  Social History ([name], [place]; 04/25/2017 2:15 PM) Alcohol use Remotely quit alcohol use. Caffeine use Carbonated beverages, Coffee. No drug use Tobacco use Current every day smoker.  Family History ([name], RMA; 04/25/2017 2:15 PM) Cancer Father. Colon Cancer Mother. Diabetes  [place] Brother, Mother, Sister. Heart Disease Daughter, Mother. Hypertension Brother, Daughter. Migraine Headache Daughter. Respiratory Condition Mother.  Pregnancy / Birth History ([name], [place]; 04/25/2017 2:15 PM) Age at menarche 33 years. Age of menopause 23-60 Gravida 5 Maternal age 75-20 Para 4  Other Problems ([name], [place]; 04/25/2017 2:15 PM) Back Pain Chronic Obstructive Lung Disease Diabetes Mellitus High blood pressure Hypercholesterolemia Thyroid Disease     Review of Systems (Tanisha A. Brown RMA; 04/25/2017 2:15 PM) General Present- Fatigue. Not Present- Appetite Loss, Chills, Fever, Night Sweats, Weight Gain and Weight Loss. Skin Not Present- Change in Wart/Mole, Dryness, Hives, Jaundice, New Lesions, Non-Healing Wounds, Rash and Ulcer. HEENT Present- Wears glasses/contact lenses. Not Present- Earache, Hearing Loss, Hoarseness, Nose Bleed, Oral Ulcers, Ringing in the Ears, Seasonal Allergies, Sinus Pain, Sore Throat, Visual Disturbances and Yellow Eyes. Respiratory Present- Difficulty Breathing. Not Present- Bloody sputum, Chronic Cough, Snoring and Wheezing. Breast Not Present- Breast Mass, Breast Pain, Nipple Discharge and Skin Changes. Cardiovascular Present- Shortness of Breath. Not Present- Chest Pain, Difficulty Breathing Lying Down, Leg Cramps, Palpitations, Rapid Heart Rate and Swelling of Extremities. Gastrointestinal Not Present- Abdominal Pain, Bloating, Bloody Stool, Change in Bowel Habits, Chronic diarrhea, Constipation, Difficulty Swallowing, Excessive gas, Gets full quickly at meals, Hemorrhoids, Indigestion, Nausea, Rectal Pain and Vomiting. Female Genitourinary Not Present- Frequency, Nocturia, Painful Urination, Pelvic Pain and Urgency. Musculoskeletal Present- Back Pain. Not Present- Joint Pain, Joint Stiffness, Muscle Pain, Muscle Weakness and Swelling of Extremities. Neurological Not Present-  Decreased Memory, Fainting, Headaches, Numbness, Seizures, Tingling, Tremor, Trouble walking and Weakness. Psychiatric Not Present- Anxiety, Bipolar, Change in Sleep Pattern, Depression, Fearful and Frequent crying. Endocrine Not Present- Cold Intolerance, Excessive Hunger, Hair Changes, Heat Intolerance, Hot flashes and New Diabetes. Hematology Not Present- Blood Thinners, Easy Bruising, Excessive bleeding, Gland problems, HIV and Persistent Infections.  Vitals (Tanisha A. Brown RMA; 04/25/2017 2:16 PM) 04/25/2017 2:15 PM Weight: 173.2 lb Height: 67in Body Surface Area: 1.9 m Body Mass Index: 27.13 kg/m  Temp.: 97.32F  Pulse: 78 (Regular)  BP: 142/86 (Sitting, Left Arm, Standard)      Physical Exam Rodman Key K. Mikesha Migliaccio MD; 04/25/2017 8:06 PM)  The physical exam findings are as follows: Note:WDWN in NAD Eyes: Pupils equal, round; sclera anicteric HENT: Oral mucosa  moist; good dentition Neck: No masses palpated, no thyromegaly Lungs: CTA bilaterally; normal respiratory effort CV: Regular rate and rhythm; no murmurs; extremities well-perfused with no edema Abd: +bowel sounds, soft, mild RUQ tenderness, small periumbilical scar; no palpable organomegaly; no palpable hernias Skin: Warm, dry; no sign of jaundice Psychiatric - alert and oriented x 4; calm mood and affect    Assessment & Plan Rodman Key K. Saleena Tamas MD; 04/25/2017 2:47 PM)  CHOLECYSTITIS WITH CHOLELITHIASIS (K80.10)  Current Plans Schedule for Surgery - Laparoscopic cholecystectomy with intraoperative cholangiogram. The surgical procedure has been discussed with the patient. Potential risks, benefits, alternative treatments, and expected outcomes have been explained. All of the patient's questions at this time have been answered. The likelihood of reaching the patient's treatment goal is good. The patient understand the proposed surgical procedure and wishes to proceed.   [name]. Georgette Dover,  MD, [facility]  General/ Trauma Surgery  04/25/2017 8:07 PM

## 2017-04-25 NOTE — H&P (Signed)
History of Present Illness [name]. Colleen Mota MD; 04/25/2017 8:05 PM) The patient is a 72 year old female who presents for evaluation of gall stones. PCP - [name], MD Reason: symptomatic cholelithiasis  This is a 72 yo female with COPD, DM, HTN, hyperlipidemia who presents after ED visit on 04/18/17. She developed acute epigastric pain that radiated throughout her abdomen. She had nausea, vomiting, and some diarrhea. Cardiac etiology was ruled out, but the pain worsened. She was evaluated in the ED and was found to have gallstones but no sign of cholecystitis. LFT's WNL. WBC was elevated at 19.9. Since discharge from the ED, she has had mild RUQ abdominal pain and occasional nausea. She presents now to discuss cholecystectomy.   CLINICAL DATA: Abdominal pain with nausea and vomiting  EXAM: CT ABDOMEN AND PELVIS WITH CONTRAST  TECHNIQUE: Multidetector CT imaging of the abdomen and pelvis was performed using the standard protocol following bolus administration of intravenous contrast.  CONTRAST: 163mL ISOVUE-300 IOPAMIDOL (ISOVUE-300) INJECTION 61%  COMPARISON: CT abdomen pelvis 05/31/2015  FINDINGS: LOWER CHEST: No basilar pulmonary nodules or pleural effusion. No apical pericardial effusion.  HEPATOBILIARY: Normal hepatic contours and density. No intra- or extrahepatic biliary dilatation. There is cholelithiasis with mild gallbladder wall thickening. No abnormal enhancement in the gallbladder fossa. The visible gallstone is at the gallbladder neck.  PANCREAS: Normal parenchymal contours without ductal dilatation. No peripancreatic fluid collection.  SPLEEN: Normal.  ADRENALS/URINARY TRACT:  --Adrenal glands: Normal.  --Right kidney/ureter: No hydronephrosis, nephroureterolithiasis, perinephric stranding or solid renal mass.  --Left kidney/ureter: There is an interpolar cyst measuring 2.4 cm. No hydronephrosis, nephroureterolithiasis, perinephric  stranding or solid renal mass.  --Urinary bladder: Normal for degree of distention  STOMACH/BOWEL:  --Stomach/Duodenum: No hiatal hernia or other gastric abnormality. Normal duodenal course.  --Small bowel: No dilatation or inflammation.  --Colon: No focal abnormality.  --Appendix: Normal.  VASCULAR/LYMPHATIC: Atherosclerotic calcification is present within the non-aneurysmal abdominal aorta, without hemodynamically significant stenosis. The portal vein, splenic vein, superior mesenteric vein and IVC are patent. No abdominal or pelvic lymphadenopathy.  REPRODUCTIVE: Increased size of right ovarian cyst, now 3.2 cm. There is mural calcification, unchanged. Normal left ovary. Status post hysterectomy.  MUSCULOSKELETAL. No bony spinal canal stenosis or focal osseous abnormality.  OTHER: None.  IMPRESSION: 1. Cholelithiasis with stone at the gallbladder neck with mild gallbladder dilatation and wall thickening. These findings are compatible with, though not diagnostic of, acute cholecystitis. Dedicated right upper quadrant ultrasound may be helpful for further characterization. 2. Increased size of right ovarian cyst with mural calcification, now measuring 3.2 cm. ACR guidelines indicate that non emergent pelvic ultrasound should be obtained for further characterization. Source: White Paper: Managing Incidental Findings on Abdominal and Pelvic CT and MRI, Part 1: Adnexal Findings, JACR Sept. 2013 3. Aortic Atherosclerosis (ICD10-I70.0).   Electronically Signed By: Ulyses Jarred M.D. On: 04/18/2017 01:5 CLINICAL DATA: Cholelithiasis on CT.  EXAM: ULTRASOUND ABDOMEN LIMITED RIGHT UPPER QUADRANT  COMPARISON: CT abdomen and pelvis 04/18/2017  FINDINGS: Gallbladder:  Gallbladder sludge and tiny stones layering in the gallbladder. Gallbladder wall thickness measures about 2.4 mm. No wall thickening or edema is identified. Murphy's sign is  negative.  Common bile duct:  Diameter: 5.1 mm, normal  Liver:  Diffusely increased parenchymal echotexture consistent with fatty infiltration. No focal lesions. Portal vein is patent on color Doppler imaging with normal direction of blood flow towards the liver.  IMPRESSION: Cholelithiasis and gallbladder sludge. No additional changes to suggest cholecystitis. Diffuse fatty infiltration of the  liver.   Electronically Signed By: Lucienne Capers M.D. On: 04/18/2017 04:57   Past Surgical History ([name], [name]; 04/25/2017 2:15 PM) Colon Polyp Removal - Colonoscopy Colon Polyp Removal - Open Hysterectomy (not due to cancer) - Partial Spinal Surgery - Lower Back Tonsillectomy  Diagnostic Studies History ([name], [name]; 04/25/2017 2:15 PM) Colonoscopy 1-5 years ago Mammogram within last year Pap Smear >5 years ago  Allergies ([name], [name]; 04/25/2017 2:16 PM) No Known Drug Allergies [04/25/2017]: Allergies Reconciled  Medication History ([name], [name]; 04/25/2017 2:16 PM) Adair Patter (100-25MCG/INH Aero Pow Br Act, Inhalation) Active. Spiriva HandiHaler (18MCG Capsule, Inhalation) Active. Glimepiride (4MG  Tablet, Oral) Active. Lantus SoloStar (100UNIT/ML Soln Pen-inj, Subcutaneous) Active. Lisinopril (10MG  Tablet, Oral) Active. MetFORMIN HCl (1000MG  Tablet, Oral) Active. Metoprolol Succinate ER (25MG  Tablet ER 24HR, Oral) Active. Ondansetron HCl (4MG  Tablet, Oral) Active. Pravastatin Sodium (40MG  Tablet, Oral) Active. Aspirin (81MG  Tablet, Oral) Active. Medications Reconciled  Social History ([name], [name]; 04/25/2017 2:15 PM) Alcohol use Remotely quit alcohol use. Caffeine use Carbonated beverages, Coffee. No drug use Tobacco use Current every day smoker.  Family History ([name], RMA; 04/25/2017 2:15 PM) Cancer Father. Colon Cancer Mother. Diabetes  Mellitus Brother, Mother, Sister. Heart Disease Daughter, Mother. Hypertension Brother, Daughter. Migraine Headache Daughter. Respiratory Condition Mother.  Pregnancy / Birth History ([name], [name]; 04/25/2017 2:15 PM) Age at menarche 89 years. Age of menopause 44-60 Gravida 5 Maternal age 29-20 Para 4  Other Problems ([name], [place]; 04/25/2017 2:15 PM) Back Pain Chronic Obstructive Lung Disease Diabetes Mellitus High blood pressure Hypercholesterolemia Thyroid Disease     Review of Systems (Colleen A. Brown RMA; 04/25/2017 2:15 PM) General Present- Fatigue. Not Present- Appetite Loss, Chills, Fever, Night Sweats, Weight Gain and Weight Loss. Skin Not Present- Change in Wart/Mole, Dryness, Hives, Jaundice, New Lesions, Non-Healing Wounds, Rash and Ulcer. HEENT Present- Wears glasses/contact lenses. Not Present- Earache, Hearing Loss, Hoarseness, Nose Bleed, Oral Ulcers, Ringing in the Ears, Seasonal Allergies, Sinus Pain, Sore Throat, Visual Disturbances and Yellow Eyes. Respiratory Present- Difficulty Breathing. Not Present- Bloody sputum, Chronic Cough, Snoring and Wheezing. Breast Not Present- Breast Mass, Breast Pain, Nipple Discharge and Skin Changes. Cardiovascular Present- Shortness of Breath. Not Present- Chest Pain, Difficulty Breathing Lying Down, Leg Cramps, Palpitations, Rapid Heart Rate and Swelling of Extremities. Gastrointestinal Not Present- Abdominal Pain, Bloating, Bloody Stool, Change in Bowel Habits, Chronic diarrhea, Constipation, Difficulty Swallowing, Excessive gas, Gets full quickly at meals, Hemorrhoids, Indigestion, Nausea, Rectal Pain and Vomiting. Female Genitourinary Not Present- Frequency, Nocturia, Painful Urination, Pelvic Pain and Urgency. Musculoskeletal Present- Back Pain. Not Present- Joint Pain, Joint Stiffness, Muscle Pain, Muscle Weakness and Swelling of Extremities. Neurological Not Present-  Decreased Memory, Fainting, Headaches, Numbness, Seizures, Tingling, Tremor, Trouble walking and Weakness. Psychiatric Not Present- Anxiety, Bipolar, Change in Sleep Pattern, Depression, Fearful and Frequent crying. Endocrine Not Present- Cold Intolerance, Excessive Hunger, Hair Changes, Heat Intolerance, Hot flashes and New Diabetes. Hematology Not Present- Blood Thinners, Easy Bruising, Excessive bleeding, Gland problems, HIV and Persistent Infections.  Vitals (Colleen A. Brown RMA; 04/25/2017 2:16 PM) 04/25/2017 2:15 PM Weight: 173.2 lb Height: 67in Body Surface Area: 1.9 m Body Mass Index: 27.13 kg/m  Temp.: 97.37F  Pulse: 78 (Regular)  BP: 142/86 (Sitting, Left Arm, Standard)      Physical Exam Rodman Key K. Brittney Mucha MD; 04/25/2017 8:06 PM)  The physical exam findings are as follows: Note:WDWN in NAD Eyes: Pupils equal, round; sclera anicteric HENT: Oral mucosa  moist; good dentition Neck: No masses palpated, no thyromegaly Lungs: CTA bilaterally; normal respiratory effort CV: Regular rate and rhythm; no murmurs; extremities well-perfused with no edema Abd: +bowel sounds, soft, mild RUQ tenderness, small periumbilical scar; no palpable organomegaly; no palpable hernias Skin: Warm, dry; no sign of jaundice Psychiatric - alert and oriented x 4; calm mood and affect    Assessment & Plan Rodman Key K. Seeley Southgate MD; 04/25/2017 2:47 PM)  CHOLECYSTITIS WITH CHOLELITHIASIS (K80.10)  Current Plans Schedule for Surgery - Laparoscopic cholecystectomy with intraoperative cholangiogram. The surgical procedure has been discussed with the patient. Potential risks, benefits, alternative treatments, and expected outcomes have been explained. All of the patient's questions at this time have been answered. The likelihood of reaching the patient's treatment goal is good. The patient understand the proposed surgical procedure and wishes to proceed.   [name]. Georgette Dover,  MD, [facility]  General/ Trauma Surgery  04/25/2017 8:07 PM

## 2017-04-26 DIAGNOSIS — Z794 Long term (current) use of insulin: Secondary | ICD-10-CM | POA: Diagnosis not present

## 2017-04-26 DIAGNOSIS — H524 Presbyopia: Secondary | ICD-10-CM | POA: Diagnosis not present

## 2017-04-26 DIAGNOSIS — H2513 Age-related nuclear cataract, bilateral: Secondary | ICD-10-CM | POA: Diagnosis not present

## 2017-04-26 DIAGNOSIS — H5203 Hypermetropia, bilateral: Secondary | ICD-10-CM | POA: Diagnosis not present

## 2017-04-26 DIAGNOSIS — H52223 Regular astigmatism, bilateral: Secondary | ICD-10-CM | POA: Diagnosis not present

## 2017-04-26 DIAGNOSIS — E119 Type 2 diabetes mellitus without complications: Secondary | ICD-10-CM | POA: Diagnosis not present

## 2017-04-26 DIAGNOSIS — Z7984 Long term (current) use of oral hypoglycemic drugs: Secondary | ICD-10-CM | POA: Diagnosis not present

## 2017-04-29 NOTE — Pre-Procedure Instructions (Signed)
[name] [facility]  04/29/2017      [facility] [place], [place] [address] [place] [address] Phone: [phone] Fax: [phone]    Your procedure is scheduled on April 17  Report to [facility] Admitting at 0900 A.M.  Call this number if you have problems the morning of surgery:  [phone]   Remember:  Do not eat food or drink liquids after midnight.  Continue all medications as directed by your physician except follow these medication instructions before surgery below   Take these medicines the morning of surgery with A SIP OF WATER  Albuterol Sulfate (PROAIR RESPICLICK)  fluticasone furoate-vilanterol (BREO ELLIPTA) metoprolol succinate (TOPROL-XL) oxyCODONE-acetaminophen (PERCOCET) tiotropium (SPIRIVA)  7 days prior to surgery STOP taking any Aspirin(unless otherwise instructed by your surgeon), Aleve, Naproxen, Ibuprofen, Motrin, Advil, Goody's, BC's, all herbal medications, fish oil, and all vitamins  Follow your doctors instructions regarding your Aspirin.  If no instructions were given by your doctor, then you will need to call the prescribing office office to get instructions.     WHAT DO I DO ABOUT MY DIABETES MEDICATION?   Marland Kitchen Do not take oral diabetes medicines (pills) the morning of surgery. metFORMIN (GLUCOPHAGE)  . THE NIGHT BEFORE SURGERY, take ____27_______ units of ___Insulin Glargine (LANTUS SOLOSTAR)________insulin.       . If your CBG is greater than 220 mg/dL, you may take  of your sliding scale (correction) dose of insulin.   How to Manage Your Diabetes Before and After Surgery  Why is it important to control my blood sugar before and after surgery? . Improving blood sugar levels before and after surgery helps healing and can limit problems. . A way of improving blood sugar control is eating a healthy diet by: o  Eating less sugar and  carbohydrates o  Increasing activity/exercise o  Talking with your doctor about reaching your blood sugar goals . High blood sugars (greater than 180 mg/dL) can raise your risk of infections and slow your recovery, so you will need to focus on controlling your diabetes during the weeks before surgery. . Make sure that the doctor who takes care of your diabetes knows about your planned surgery including the date and location.  How do I manage my blood sugar before surgery? . Check your blood sugar at least 4 times a day, starting 2 days before surgery, to make sure that the level is not too high or low. o Check your blood sugar the morning of your surgery when you wake up and every 2 hours until you get to the Short Stay unit. . If your blood sugar is less than 70 mg/dL, you will need to treat for low blood sugar: o Do not take insulin. o Treat a low blood sugar (less than 70 mg/dL) with  cup of clear juice (cranberry or apple), 4 glucose tablets, OR glucose gel. o Recheck blood sugar in 15 minutes after treatment (to make sure it is greater than 70 mg/dL). If your blood sugar is not greater than 70 mg/dL on recheck, call [phone] for further instructions. . Report your blood sugar to the short stay nurse when you get to Short Stay.  . If you are admitted to the hospital after surgery: o Your blood sugar will be checked by the staff and you will probably be given insulin after surgery (instead of oral  diabetes medicines) to make sure you have good blood sugar levels. o The goal for blood sugar control after surgery is 80-180 mg/dL.    Do not wear jewelry, make-up or nail polish.  Do not wear lotions, powders, or perfumes, or deodorant.  Do not shave 48 hours prior to surgery.    Do not bring valuables to the hospital.  [facility] is not responsible for any belongings or valuables.  Contacts, dentures or bridgework may not be worn into surgery.  Leave your suitcase in the car.  After  surgery it may be brought to your room.  For patients admitted to the hospital, discharge time will be determined by your treatment team.  Patients discharged the day of surgery will not be allowed to drive home.    Special instructions:   [name]- Preparing For Surgery  Before surgery, you can play an important role. Because skin is not sterile, your skin needs to be as free of germs as possible. You can reduce the number of germs on your skin by washing with CHG (chlorahexidine gluconate) Soap before surgery.  CHG is an antiseptic cleaner which kills germs and bonds with the skin to continue killing germs even after washing.  Please do not use if you have an allergy to CHG or antibacterial soaps. If your skin becomes reddened/irritated stop using the CHG.  Do not shave (including legs and underarms) for at least 48 hours prior to first CHG shower. It is OK to shave your face.  Please follow these instructions carefully.   1. Shower the NIGHT BEFORE SURGERY and the MORNING OF SURGERY with CHG.   2. If you chose to wash your hair, wash your hair first as usual with your normal shampoo.  3. After you shampoo, rinse your hair and body thoroughly to remove the shampoo.  4. Use CHG as you would any other liquid soap. You can apply CHG directly to the skin and wash gently with a scrungie or a clean washcloth.   5. Apply the CHG Soap to your body ONLY FROM THE NECK DOWN.  Do not use on open wounds or open sores. Avoid contact with your eyes, ears, mouth and genitals (private parts). Wash Face and genitals (private parts)  with your normal soap.  6. Wash thoroughly, paying special attention to the area where your surgery will be performed.  7. Thoroughly rinse your body with warm water from the neck down.  8. DO NOT shower/wash with your normal soap after using and rinsing off the CHG Soap.  9. Pat yourself dry with a CLEAN TOWEL.  10. Wear CLEAN PAJAMAS to bed the night before  surgery, wear comfortable clothes the morning of surgery  11. Place CLEAN SHEETS on your bed the night of your first shower and DO NOT SLEEP WITH PETS.    Day of Surgery: Do not apply any deodorants/lotions. Please wear clean clothes to the hospital/surgery center.      Please read over the following fact sheets that you were given.

## 2017-04-29 NOTE — Telephone Encounter (Signed)
ED notes reviewed on 04/18/17.

## 2017-04-30 ENCOUNTER — Other Ambulatory Visit: Payer: Self-pay

## 2017-04-30 ENCOUNTER — Encounter (HOSPITAL_COMMUNITY): Payer: Self-pay

## 2017-04-30 ENCOUNTER — Encounter (HOSPITAL_COMMUNITY)
Admission: RE | Admit: 2017-04-30 | Discharge: 2017-04-30 | Disposition: A | Discharge status: Home / Self Care | Payer: Medicare Other | Source: Ambulatory Visit | Attending: Surgery | Admitting: Surgery

## 2017-04-30 DIAGNOSIS — Z833 Family history of diabetes mellitus: Secondary | ICD-10-CM | POA: Diagnosis not present

## 2017-04-30 DIAGNOSIS — Z794 Long term (current) use of insulin: Secondary | ICD-10-CM | POA: Diagnosis not present

## 2017-04-30 DIAGNOSIS — Z8249 Family history of ischemic heart disease and other diseases of the circulatory system: Secondary | ICD-10-CM | POA: Diagnosis not present

## 2017-04-30 DIAGNOSIS — E119 Type 2 diabetes mellitus without complications: Secondary | ICD-10-CM | POA: Diagnosis not present

## 2017-04-30 DIAGNOSIS — Z820 Family history of epilepsy and other diseases of the nervous system: Secondary | ICD-10-CM | POA: Diagnosis not present

## 2017-04-30 DIAGNOSIS — Z79899 Other long term (current) drug therapy: Secondary | ICD-10-CM | POA: Diagnosis not present

## 2017-04-30 DIAGNOSIS — E079 Disorder of thyroid, unspecified: Secondary | ICD-10-CM | POA: Diagnosis not present

## 2017-04-30 DIAGNOSIS — J449 Chronic obstructive pulmonary disease, unspecified: Secondary | ICD-10-CM | POA: Diagnosis not present

## 2017-04-30 DIAGNOSIS — E7800 Pure hypercholesterolemia, unspecified: Secondary | ICD-10-CM | POA: Diagnosis not present

## 2017-04-30 DIAGNOSIS — Z7982 Long term (current) use of aspirin: Secondary | ICD-10-CM | POA: Diagnosis not present

## 2017-04-30 DIAGNOSIS — K828 Other specified diseases of gallbladder: Secondary | ICD-10-CM | POA: Diagnosis not present

## 2017-04-30 DIAGNOSIS — Z836 Family history of other diseases of the respiratory system: Secondary | ICD-10-CM | POA: Diagnosis not present

## 2017-04-30 DIAGNOSIS — E785 Hyperlipidemia, unspecified: Secondary | ICD-10-CM | POA: Diagnosis not present

## 2017-04-30 DIAGNOSIS — Z800 Family history of malignant neoplasm of digestive organs: Secondary | ICD-10-CM | POA: Diagnosis not present

## 2017-04-30 DIAGNOSIS — K8010 Calculus of gallbladder with chronic cholecystitis without obstruction: Secondary | ICD-10-CM | POA: Diagnosis not present

## 2017-04-30 DIAGNOSIS — F17200 Nicotine dependence, unspecified, uncomplicated: Secondary | ICD-10-CM | POA: Diagnosis not present

## 2017-04-30 DIAGNOSIS — I10 Essential (primary) hypertension: Secondary | ICD-10-CM | POA: Diagnosis not present

## 2017-04-30 LAB — GLUCOSE, CAPILLARY: GLUCOSE-CAPILLARY: 102 mg/dL — AB (ref 65–99)

## 2017-04-30 NOTE — Progress Notes (Signed)
PCP - Dr. [name] Cardiologist - patient denies  Chest x-ray - 04/17/2017 EKG - 04/18/2017 Stress Test - patient denies ECHO - patient denies Cardiac Cath - patient denies  Sleep Study - patient denies  Fasting Blood Sugar - 100's Checks Blood Sugar 3 times a day   Aspirin Instructions: last dose was 04/25/2017 Anesthesia review: n/a  Patient denies shortness of breath, fever, cough and chest pain at PAT appointment   Patient verbalized understanding of instructions that were given to them at the PAT appointment. Patient was also instructed that they will need to review over the PAT instructions again at home before surgery.

## 2017-04-30 NOTE — Pre-Procedure Instructions (Signed)
Yamaris Cummings [facility]  04/30/2017      [facility] [place], [place] [address] [place] [address] Phone: [phone] Fax: [phone]    Your procedure is scheduled on April 17  Report to [facility] Admitting at 0900 A.M.  Call this number if you have problems the morning of surgery:  [phone]   Remember:  Do not eat food or drink liquids after midnight.  Continue all medications as directed by your physician except follow these medication instructions before surgery below   Take these medicines the morning of surgery with A SIP OF WATER  Albuterol Sulfate (PROAIR RESPICLICK)  fluticasone furoate-vilanterol (BREO ELLIPTA) metoprolol succinate (TOPROL-XL) oxyCODONE-acetaminophen (PERCOCET) ondasetron (Zofran) - if needed tiotropium (SPIRIVA)  7 days prior to surgery STOP taking any Aspirin(unless otherwise instructed by your surgeon), Aleve, Naproxen, Ibuprofen, Motrin, Advil, Goody's, BC's, all herbal medications, fish oil, and all vitamins  Follow your doctors instructions regarding your Aspirin.  If no instructions were given by your doctor, then you will need to call the prescribing office office to get instructions.     WHAT DO I DO ABOUT MY DIABETES MEDICATION?   Marland Kitchen Do not take oral diabetes medicines (pills) the morning of surgery. metFORMIN (GLUCOPHAGE) or Glimepiride (AMARYL)  . THE NIGHT BEFORE SURGERY, take ____27_______ units of ___Insulin Glargine (LANTUS SOLOSTAR)________insulin.       . If your CBG is greater than 220 mg/dL, you may take  of your sliding scale (correction) dose of insulin.   How to Manage Your Diabetes Before and After Surgery  Why is it important to control my blood sugar before and after surgery? . Improving blood sugar levels before and after surgery helps healing and can limit problems. . A way of improving blood sugar control  is eating a healthy diet by: o  Eating less sugar and carbohydrates o  Increasing activity/exercise o  Talking with your doctor about reaching your blood sugar goals . High blood sugars (greater than 180 mg/dL) can raise your risk of infections and slow your recovery, so you will need to focus on controlling your diabetes during the weeks before surgery. . Make sure that the doctor who takes care of your diabetes knows about your planned surgery including the date and location.  How do I manage my blood sugar before surgery? . Check your blood sugar at least 4 times a day, starting 2 days before surgery, to make sure that the level is not too high or low. o Check your blood sugar the morning of your surgery when you wake up and every 2 hours until you get to the Short Stay unit. . If your blood sugar is less than 70 mg/dL, you will need to treat for low blood sugar: o Do not take insulin. o Treat a low blood sugar (less than 70 mg/dL) with  cup of clear juice (cranberry or apple), 4 glucose tablets, OR glucose gel. o Recheck blood sugar in 15 minutes after treatment (to make sure it is greater than 70 mg/dL). If your blood sugar is not greater than 70 mg/dL on recheck, call [phone] for further instructions. . Report your blood sugar to the short stay nurse when you get to Short Stay.  . If you are admitted to the hospital after surgery: o Your blood sugar will be checked by the staff and you will probably  be given insulin after surgery (instead of oral diabetes medicines) to make sure you have good blood sugar levels. o The goal for blood sugar control after surgery is 80-180 mg/dL.    Do not wear jewelry, make-up or nail polish.  Do not wear lotions, powders, or perfumes, or deodorant.  Do not shave 48 hours prior to surgery.    Do not bring valuables to the hospital.  [facility] is not responsible for any belongings or valuables.  Contacts, dentures or bridgework may not be worn  into surgery.  Leave your suitcase in the car.  After surgery it may be brought to your room.  For patients admitted to the hospital, discharge time will be determined by your treatment team.  Patients discharged the day of surgery will not be allowed to drive home.    Special instructions:   [facility]- Preparing For Surgery  Before surgery, you can play an important role. Because skin is not sterile, your skin needs to be as free of germs as possible. You can reduce the number of germs on your skin by washing with CHG (chlorahexidine gluconate) Soap before surgery.  CHG is an antiseptic cleaner which kills germs and bonds with the skin to continue killing germs even after washing.  Please do not use if you have an allergy to CHG or antibacterial soaps. If your skin becomes reddened/irritated stop using the CHG.  Do not shave (including legs and underarms) for at least 48 hours prior to first CHG shower. It is OK to shave your face.  Please follow these instructions carefully.   1. Shower the NIGHT BEFORE SURGERY and the MORNING OF SURGERY with CHG.   2. If you chose to wash your hair, wash your hair first as usual with your normal shampoo.  3. After you shampoo, rinse your hair and body thoroughly to remove the shampoo.  4. Use CHG as you would any other liquid soap. You can apply CHG directly to the skin and wash gently with a scrungie or a clean washcloth.   5. Apply the CHG Soap to your body ONLY FROM THE NECK DOWN.  Do not use on open wounds or open sores. Avoid contact with your eyes, ears, mouth and genitals (private parts). Wash Face and genitals (private parts)  with your normal soap.  6. Wash thoroughly, paying special attention to the area where your surgery will be performed.  7. Thoroughly rinse your body with warm water from the neck down.  8. DO NOT shower/wash with your normal soap after using and rinsing off the CHG Soap.  9. Pat yourself dry with a CLEAN  TOWEL.  10. Wear CLEAN PAJAMAS to bed the night before surgery, wear comfortable clothes the morning of surgery  11. Place CLEAN SHEETS on your bed the night of your first shower and DO NOT SLEEP WITH PETS.    Day of Surgery: Do not apply any deodorants/lotions. Please wear clean clothes to the hospital/surgery center.      Please read over the following fact sheets that you were given.

## 2017-05-01 ENCOUNTER — Ambulatory Visit (HOSPITAL_COMMUNITY): Payer: Medicare Other

## 2017-05-01 ENCOUNTER — Encounter (HOSPITAL_COMMUNITY)
Admission: RE | Disposition: A | Discharge status: Home / Self Care | Payer: Self-pay | Source: Ambulatory Visit | Attending: Surgery

## 2017-05-01 ENCOUNTER — Ambulatory Visit (HOSPITAL_COMMUNITY)
Admission: RE | Admit: 2017-05-01 | Discharge: 2017-05-01 | Disposition: A | Discharge status: Home / Self Care | Payer: Medicare Other | Source: Ambulatory Visit | Attending: Surgery | Admitting: Surgery

## 2017-05-01 ENCOUNTER — Ambulatory Visit (HOSPITAL_COMMUNITY): Payer: Medicare Other | Admitting: Certified Registered Nurse Anesthetist

## 2017-05-01 ENCOUNTER — Encounter (HOSPITAL_COMMUNITY): Payer: Self-pay

## 2017-05-01 DIAGNOSIS — Z794 Long term (current) use of insulin: Secondary | ICD-10-CM | POA: Diagnosis not present

## 2017-05-01 DIAGNOSIS — Z79899 Other long term (current) drug therapy: Secondary | ICD-10-CM | POA: Diagnosis not present

## 2017-05-01 DIAGNOSIS — Z833 Family history of diabetes mellitus: Secondary | ICD-10-CM | POA: Insufficient documentation

## 2017-05-01 DIAGNOSIS — I10 Essential (primary) hypertension: Secondary | ICD-10-CM | POA: Insufficient documentation

## 2017-05-01 DIAGNOSIS — Z7982 Long term (current) use of aspirin: Secondary | ICD-10-CM | POA: Diagnosis not present

## 2017-05-01 DIAGNOSIS — K8010 Calculus of gallbladder with chronic cholecystitis without obstruction: Secondary | ICD-10-CM | POA: Diagnosis not present

## 2017-05-01 DIAGNOSIS — E7800 Pure hypercholesterolemia, unspecified: Secondary | ICD-10-CM | POA: Insufficient documentation

## 2017-05-01 DIAGNOSIS — Z836 Family history of other diseases of the respiratory system: Secondary | ICD-10-CM | POA: Insufficient documentation

## 2017-05-01 DIAGNOSIS — Z820 Family history of epilepsy and other diseases of the nervous system: Secondary | ICD-10-CM | POA: Insufficient documentation

## 2017-05-01 DIAGNOSIS — K828 Other specified diseases of gallbladder: Secondary | ICD-10-CM | POA: Diagnosis not present

## 2017-05-01 DIAGNOSIS — K219 Gastro-esophageal reflux disease without esophagitis: Secondary | ICD-10-CM | POA: Diagnosis not present

## 2017-05-01 DIAGNOSIS — E785 Hyperlipidemia, unspecified: Secondary | ICD-10-CM | POA: Insufficient documentation

## 2017-05-01 DIAGNOSIS — J449 Chronic obstructive pulmonary disease, unspecified: Secondary | ICD-10-CM | POA: Diagnosis not present

## 2017-05-01 DIAGNOSIS — K8000 Calculus of gallbladder with acute cholecystitis without obstruction: Secondary | ICD-10-CM | POA: Diagnosis not present

## 2017-05-01 DIAGNOSIS — Z8249 Family history of ischemic heart disease and other diseases of the circulatory system: Secondary | ICD-10-CM | POA: Diagnosis not present

## 2017-05-01 DIAGNOSIS — F17200 Nicotine dependence, unspecified, uncomplicated: Secondary | ICD-10-CM | POA: Diagnosis not present

## 2017-05-01 DIAGNOSIS — E119 Type 2 diabetes mellitus without complications: Secondary | ICD-10-CM | POA: Insufficient documentation

## 2017-05-01 DIAGNOSIS — E079 Disorder of thyroid, unspecified: Secondary | ICD-10-CM | POA: Insufficient documentation

## 2017-05-01 DIAGNOSIS — Z800 Family history of malignant neoplasm of digestive organs: Secondary | ICD-10-CM | POA: Diagnosis not present

## 2017-05-01 DIAGNOSIS — Z419 Encounter for procedure for purposes other than remedying health state, unspecified: Secondary | ICD-10-CM

## 2017-05-01 HISTORY — PX: CHOLECYSTECTOMY: SHX55

## 2017-05-01 LAB — GLUCOSE, CAPILLARY
GLUCOSE-CAPILLARY: 68 mg/dL (ref 65–99)
Glucose-Capillary: 76 mg/dL (ref 65–99)
Glucose-Capillary: 95 mg/dL (ref 65–99)
Glucose-Capillary: 121 mg/dL — ABNORMAL HIGH (ref 65–99)

## 2017-05-01 SURGERY — LAPAROSCOPIC CHOLECYSTECTOMY WITH INTRAOPERATIVE CHOLANGIOGRAM
Anesthesia: General | Site: Abdomen

## 2017-05-01 MED ORDER — ROCURONIUM BROMIDE 10 MG/ML (PF) SYRINGE
PREFILLED_SYRINGE | INTRAVENOUS | Status: AC
  Filled 2017-05-01: qty 5

## 2017-05-01 MED ORDER — CHLORHEXIDINE GLUCONATE CLOTH 2 % EX PADS: 6.0000 | MEDICATED_PAD | Freq: Once | CUTANEOUS | Status: DC

## 2017-05-01 MED ORDER — LACTATED RINGERS IV SOLN
INTRAVENOUS | Status: DC
  Administered 2017-05-01: 09:00:00 via INTRAVENOUS

## 2017-05-01 MED ORDER — CEFAZOLIN SODIUM-DEXTROSE 2-4 GM/100ML-% IV SOLN
2.0000 g | INTRAVENOUS | Status: AC
  Administered 2017-05-01: 2 g via INTRAVENOUS

## 2017-05-01 MED ORDER — PHENYLEPHRINE 40 MCG/ML (10ML) SYRINGE FOR IV PUSH (FOR BLOOD PRESSURE SUPPORT)
PREFILLED_SYRINGE | INTRAVENOUS | Status: DC | PRN
  Administered 2017-05-01: 80 ug via INTRAVENOUS

## 2017-05-01 MED ORDER — PHENYLEPHRINE 40 MCG/ML (10ML) SYRINGE FOR IV PUSH (FOR BLOOD PRESSURE SUPPORT)
PREFILLED_SYRINGE | INTRAVENOUS | Status: AC
  Filled 2017-05-01: qty 10

## 2017-05-01 MED ORDER — EPHEDRINE 5 MG/ML INJ
INTRAVENOUS | Status: AC
  Filled 2017-05-01: qty 10

## 2017-05-01 MED ORDER — SODIUM CHLORIDE 0.9 % IV SOLN
INTRAVENOUS | Status: DC | PRN
  Administered 2017-05-01: 12 mL

## 2017-05-01 MED ORDER — DEXAMETHASONE SODIUM PHOSPHATE 10 MG/ML IJ SOLN
INTRAMUSCULAR | Status: DC | PRN
  Administered 2017-05-01: 5 mg via INTRAVENOUS

## 2017-05-01 MED ORDER — CEFAZOLIN SODIUM-DEXTROSE 2-4 GM/100ML-% IV SOLN
INTRAVENOUS | Status: AC
  Filled 2017-05-01: qty 100

## 2017-05-01 MED ORDER — ONDANSETRON HCL 4 MG/2ML IJ SOLN
INTRAMUSCULAR | Status: DC | PRN
  Administered 2017-05-01: 4 mg via INTRAVENOUS

## 2017-05-01 MED ORDER — FENTANYL CITRATE (PF) 250 MCG/5ML IJ SOLN
INTRAMUSCULAR | Status: AC
  Filled 2017-05-01: qty 5

## 2017-05-01 MED ORDER — OXYCODONE HCL 5 MG PO TABS: 5.0000 mg | ORAL_TABLET | Freq: Four times a day (QID) | ORAL | 0 refills | Status: DC | PRN

## 2017-05-01 MED ORDER — SODIUM CHLORIDE 0.9 % IR SOLN
Status: DC | PRN
  Administered 2017-05-01: 1000 mL

## 2017-05-01 MED ORDER — OXYCODONE HCL 5 MG PO TABS
5.0000 mg | ORAL_TABLET | Freq: Once | ORAL | Status: AC
  Administered 2017-05-01: 5 mg via ORAL

## 2017-05-01 MED ORDER — ONDANSETRON HCL 4 MG/2ML IJ SOLN
INTRAMUSCULAR | Status: AC
  Filled 2017-05-01: qty 2

## 2017-05-01 MED ORDER — PROPOFOL 10 MG/ML IV BOLUS
INTRAVENOUS | Status: DC | PRN
  Administered 2017-05-01: 150 mg via INTRAVENOUS

## 2017-05-01 MED ORDER — ROCURONIUM BROMIDE 10 MG/ML (PF) SYRINGE
PREFILLED_SYRINGE | INTRAVENOUS | Status: DC | PRN
  Administered 2017-05-01: 50 mg via INTRAVENOUS
  Administered 2017-05-01: 5 mg via INTRAVENOUS

## 2017-05-01 MED ORDER — DEXAMETHASONE SODIUM PHOSPHATE 10 MG/ML IJ SOLN
INTRAMUSCULAR | Status: AC
  Filled 2017-05-01: qty 1

## 2017-05-01 MED ORDER — FENTANYL CITRATE (PF) 100 MCG/2ML IJ SOLN
25.0000 ug | INTRAMUSCULAR | Status: DC | PRN
  Administered 2017-05-01 (×2): 50 ug via INTRAVENOUS

## 2017-05-01 MED ORDER — BUPIVACAINE-EPINEPHRINE (PF) 0.25% -1:200000 IJ SOLN
INTRAMUSCULAR | Status: AC
  Filled 2017-05-01: qty 30

## 2017-05-01 MED ORDER — OXYCODONE HCL 5 MG PO TABS
ORAL_TABLET | ORAL | Status: AC
  Filled 2017-05-01: qty 1

## 2017-05-01 MED ORDER — HEMOSTATIC AGENTS (NO CHARGE) OPTIME
TOPICAL | Status: DC | PRN
  Administered 2017-05-01: 1 via TOPICAL

## 2017-05-01 MED ORDER — SUGAMMADEX SODIUM 200 MG/2ML IV SOLN
INTRAVENOUS | Status: AC
  Filled 2017-05-01: qty 2

## 2017-05-01 MED ORDER — FENTANYL CITRATE (PF) 100 MCG/2ML IJ SOLN
INTRAMUSCULAR | Status: AC
  Administered 2017-05-01: 50 ug via INTRAVENOUS
  Filled 2017-05-01: qty 2

## 2017-05-01 MED ORDER — MEPERIDINE HCL 50 MG/ML IJ SOLN: 6.2500 mg | INTRAMUSCULAR | Status: DC | PRN

## 2017-05-01 MED ORDER — LIDOCAINE 2% (20 MG/ML) 5 ML SYRINGE
INTRAMUSCULAR | Status: DC | PRN
  Administered 2017-05-01: 60 mg via INTRAVENOUS

## 2017-05-01 MED ORDER — BUPIVACAINE-EPINEPHRINE 0.25% -1:200000 IJ SOLN
INTRAMUSCULAR | Status: DC | PRN
  Administered 2017-05-01: 8 mL

## 2017-05-01 MED ORDER — FENTANYL CITRATE (PF) 250 MCG/5ML IJ SOLN
INTRAMUSCULAR | Status: DC | PRN
  Administered 2017-05-01: 100 ug via INTRAVENOUS

## 2017-05-01 MED ORDER — 0.9 % SODIUM CHLORIDE (POUR BTL) OPTIME
TOPICAL | Status: DC | PRN
  Administered 2017-05-01: 1000 mL

## 2017-05-01 MED ORDER — PROPOFOL 10 MG/ML IV BOLUS
INTRAVENOUS | Status: AC
  Filled 2017-05-01: qty 20

## 2017-05-01 MED ORDER — LIDOCAINE 2% (20 MG/ML) 5 ML SYRINGE
INTRAMUSCULAR | Status: AC
  Filled 2017-05-01: qty 5

## 2017-05-01 MED ORDER — IOPAMIDOL (ISOVUE-300) INJECTION 61%
INTRAVENOUS | Status: AC
  Filled 2017-05-01: qty 50

## 2017-05-01 MED ORDER — SUGAMMADEX SODIUM 200 MG/2ML IV SOLN
INTRAVENOUS | Status: DC | PRN
  Administered 2017-05-01: 150 mg via INTRAVENOUS

## 2017-05-01 SURGICAL SUPPLY — 49 items
APL SKNCLS STERI-STRIP NONHPOA (GAUZE/BANDAGES/DRESSINGS) ×1
APPLIER CLIP ROT 10 11.4 M/L (STAPLE) ×2
APR CLP MED LRG 11.4X10 (STAPLE) ×1
BAG SPEC RTRVL 10 TROC 200 (ENDOMECHANICALS)
BAG SPEC RTRVL LRG 6X4 10 (ENDOMECHANICALS) ×1
BENZOIN TINCTURE PRP APPL 2/3 (GAUZE/BANDAGES/DRESSINGS) ×2 IMPLANT
BLADE CLIPPER SURG (BLADE) IMPLANT
CANISTER SUCT 3000ML PPV (MISCELLANEOUS) ×2 IMPLANT
CHLORAPREP W/TINT 26ML (MISCELLANEOUS) ×2 IMPLANT
CLIP APPLIE ROT 10 11.4 M/L (STAPLE) ×1 IMPLANT
CLSR STERI-STRIP ANTIMIC 1/2X4 (GAUZE/BANDAGES/DRESSINGS) ×1 IMPLANT
COVER MAYO STAND STRL (DRAPES) ×2 IMPLANT
COVER SURGICAL LIGHT HANDLE (MISCELLANEOUS) ×2 IMPLANT
DRAPE C-ARM 42X72 X-RAY (DRAPES) ×2 IMPLANT
DRSG TEGADERM 2-3/8X2-3/4 SM (GAUZE/BANDAGES/DRESSINGS) ×5 IMPLANT
DRSG TEGADERM 4X4.75 (GAUZE/BANDAGES/DRESSINGS) ×2 IMPLANT
ELECT REM PT RETURN 9FT ADLT (ELECTROSURGICAL) ×2
ELECTRODE REM PT RTRN 9FT ADLT (ELECTROSURGICAL) ×1 IMPLANT
FILTER SMOKE EVAC LAPAROSHD (FILTER) ×2 IMPLANT
GAUZE SPONGE 2X2 8PLY STRL LF (GAUZE/BANDAGES/DRESSINGS) ×1 IMPLANT
GLOVE BIO SURGEON STRL SZ7 (GLOVE) ×2 IMPLANT
GLOVE BIOGEL PI IND STRL 7.5 (GLOVE) ×1 IMPLANT
GLOVE BIOGEL PI INDICATOR 7.5 (GLOVE) ×1
GOWN STRL REUS W/ TWL LRG LVL3 (GOWN DISPOSABLE) ×3 IMPLANT
GOWN STRL REUS W/TWL LRG LVL3 (GOWN DISPOSABLE) ×6
HEMOSTAT SNOW SURGICEL 2X4 (HEMOSTASIS) ×1 IMPLANT
KIT BASIN OR (CUSTOM PROCEDURE TRAY) ×2 IMPLANT
KIT TURNOVER KIT B (KITS) ×2 IMPLANT
NS IRRIG 1000ML POUR BTL (IV SOLUTION) ×2 IMPLANT
PAD ARMBOARD 7.5X6 YLW CONV (MISCELLANEOUS) ×2 IMPLANT
POUCH RETRIEVAL ECOSAC 10 (ENDOMECHANICALS) IMPLANT
POUCH RETRIEVAL ECOSAC 10MM (ENDOMECHANICALS)
POUCH SPECIMEN RETRIEVAL 10MM (ENDOMECHANICALS) ×1 IMPLANT
SCISSORS LAP 5X35 DISP (ENDOMECHANICALS) ×2 IMPLANT
SET CHOLANGIOGRAPH 5 50 .035 (SET/KITS/TRAYS/PACK) ×2 IMPLANT
SET IRRIG TUBING LAPAROSCOPIC (IRRIGATION / IRRIGATOR) ×2 IMPLANT
SLEEVE ENDOPATH XCEL 5M (ENDOMECHANICALS) ×2 IMPLANT
SPECIMEN JAR SMALL (MISCELLANEOUS) ×2 IMPLANT
SPONGE GAUZE 2X2 STER 10/PKG (GAUZE/BANDAGES/DRESSINGS) ×1
STRIP CLOSURE SKIN 1/2X4 (GAUZE/BANDAGES/DRESSINGS) ×2 IMPLANT
SUT MNCRL AB 4-0 PS2 18 (SUTURE) ×2 IMPLANT
TOWEL OR 17X24 6PK STRL BLUE (TOWEL DISPOSABLE) ×2 IMPLANT
TOWEL OR 17X26 10 PK STRL BLUE (TOWEL DISPOSABLE) ×1 IMPLANT
TRAY LAPAROSCOPIC MC (CUSTOM PROCEDURE TRAY) ×2 IMPLANT
TROCAR XCEL BLUNT TIP 100MML (ENDOMECHANICALS) ×2 IMPLANT
TROCAR XCEL NON-BLD 11X100MML (ENDOMECHANICALS) ×2 IMPLANT
TROCAR XCEL NON-BLD 5MMX100MML (ENDOMECHANICALS) ×2 IMPLANT
TUBING INSUFFLATION (TUBING) ×2 IMPLANT
WATER STERILE IRR 1000ML POUR (IV SOLUTION) ×1 IMPLANT

## 2017-05-01 NOTE — Transfer of Care (Signed)
Immediate Anesthesia Transfer of Care Note  Patient: [name]  Procedure(s) Performed: LAPAROSCOPIC CHOLECYSTECTOMY WITH INTRAOPERATIVE CHOLANGIOGRAM (N/A Abdomen)  Patient Location: PACU  Anesthesia Type:General  Level of Consciousness: drowsy and patient cooperative  Airway & Oxygen Therapy: Patient Spontanous Breathing and Patient connected to nasal cannula oxygen  Post-op Assessment: Report given to RN, Post -op Vital signs reviewed and stable and Patient moving all extremities X 4  Post vital signs: Reviewed and stable  Last Vitals:  Vitals Value Taken Time  BP 156/77 05/01/2017 12:36 PM  Temp 36.6 C 05/01/2017 12:36 PM  Pulse    Resp 23 05/01/2017 12:38 PM  SpO2    Vitals shown include unvalidated device data.  Last Pain:  Vitals:   05/01/17 1236  TempSrc:   PainSc: Asleep         Complications: No apparent anesthesia complications

## 2017-05-01 NOTE — Anesthesia Preprocedure Evaluation (Addendum)
Anesthesia Evaluation  Patient identified by MRN, date of birth, ID band Patient awake    Reviewed: Allergy & Precautions, H&P , NPO status , Patient's Chart, lab work & pertinent test results, reviewed documented beta blocker date and time   Airway Mallampati: I  TM Distance: >3 FB Neck ROM: full    Dental no notable dental hx. (+) Poor Dentition, Chipped, Missing, Loose,    Pulmonary COPD, Current Smoker,    Pulmonary exam normal breath sounds clear to auscultation       Cardiovascular Exercise Tolerance: Good hypertension, Pt. on medications and Pt. on home beta blockers  Rhythm:regular Rate:Normal     Neuro/Psych negative neurological ROS  negative psych ROS   GI/Hepatic negative GI ROS, Neg liver ROS,   Endo/Other  diabetes, Type 2, Insulin Dependent  Renal/GU negative Renal ROS  negative genitourinary   Musculoskeletal  (+) Arthritis , Osteoarthritis,    Abdominal   Peds  Hematology negative hematology ROS (+)   Anesthesia Other Findings   Reproductive/Obstetrics negative OB ROS                            Anesthesia Physical Anesthesia Plan  ASA: III  Anesthesia Plan: General   Post-op Pain Management:    Induction: Intravenous  PONV Risk Score and Plan: 3 and Ondansetron, Treatment may vary due to age or medical condition and Dexamethasone  Airway Management Planned: Oral ETT  Additional Equipment:   Intra-op Plan:   Post-operative Plan: Extubation in OR  Informed Consent: I have reviewed the patients History and Physical, chart, labs and discussed the procedure including the risks, benefits and alternatives for the proposed anesthesia with the patient or authorized representative who has indicated his/her understanding and acceptance.   Dental Advisory Given  Plan Discussed with: CRNA, Anesthesiologist and Surgeon  Anesthesia Plan Comments: (  )         Anesthesia Quick Evaluation

## 2017-05-01 NOTE — Interval H&P Note (Signed)
History and Physical Interval Note:  05/01/2017 10:26 AM  [name]  has presented today for surgery, with the diagnosis of CHRONIC CALCULUS CHOLECYSTITIS  The various methods of treatment have been discussed with the patient and family. After consideration of risks, benefits and other options for treatment, the patient has consented to  Procedure(s): LAPAROSCOPIC CHOLECYSTECTOMY WITH INTRAOPERATIVE CHOLANGIOGRAM (N/A) as a surgical intervention .  The patient's history has been reviewed, patient examined, no change in status, stable for surgery.  I have reviewed the patient's chart and labs.  Questions were answered to the patient's satisfaction.     [name]

## 2017-05-01 NOTE — Op Note (Signed)
Laparoscopic Cholecystectomy with IOC Procedure Note  Indications: This patient presents with symptomatic gallbladder disease and will undergo laparoscopic cholecystectomy.  Pre-operative Diagnosis: Calculus of gallbladder with other cholecystitis, without mention of obstruction  Post-operative Diagnosis: Same  Surgeon: [name]   Assistants: none  Anesthesia: General endotracheal anesthesia  ASA Class: 2  Procedure Details  The patient was seen again in the Holding Room. The risks, benefits, complications, treatment options, and expected outcomes were discussed with the patient. The possibilities of reaction to medication, pulmonary aspiration, perforation of viscus, bleeding, recurrent infection, finding a normal gallbladder, the need for additional procedures, failure to diagnose a condition, the possible need to convert to an open procedure, and creating a complication requiring transfusion or operation were discussed with the patient. The likelihood of improving the patient's symptoms with return to their baseline status is good.  The patient and/or family concurred with the proposed plan, giving informed consent. The site of surgery properly noted. The patient was taken to Operating Room, identified as [name] and the procedure verified as Laparoscopic Cholecystectomy with Intraoperative Cholangiogram. A Time Out was held and the above information confirmed.  Prior to the induction of general anesthesia, antibiotic prophylaxis was administered. General endotracheal anesthesia was then administered and tolerated well. After the induction, the abdomen was prepped with Chloraprep and draped in the sterile fashion. The patient was positioned in the supine position.  Local anesthetic agent was injected into the skin near the umbilicus and an incision made. We dissected down to the abdominal fascia with blunt dissection.  The fascia was incised vertically and we entered the  peritoneal cavity bluntly.  A pursestring suture of 0-Vicryl was placed around the fascial opening.  The Hasson cannula was inserted and secured with the stay suture.  Pneumoperitoneum was then created with CO2 and tolerated well without any adverse changes in the patient's vital signs. An 11-mm port was placed in the subxiphoid position.  Two 5-mm ports were placed in the right upper quadrant. All skin incisions were infiltrated with a local anesthetic agent before making the incision and placing the trocars.   We positioned the patient in reverse Trendelenburg, tilted slightly to the patient's left.  There are significant adhesions of the omentum and duodenum to the liver edge and to the gallbladder.  We bluntly dissected these away to expose the gallbladder.  The gallbladder was identified, the fundus grasped and retracted cephalad. Adhesions were lysed bluntly and with the electrocautery where indicated, taking care not to injure any adjacent organs or viscus. The infundibulum was grasped and retracted laterally, exposing the peritoneum overlying the triangle of Calot. This was then divided and exposed in a blunt fashion. A critical view of the cystic duct and cystic artery was obtained.  The cystic duct was clearly identified and bluntly dissected circumferentially. The cystic duct was ligated with a clip distally.   An incision was made in the cystic duct and the [facility] cholangiogram catheter introduced. The catheter was secured using a clip. A cholangiogram was then obtained which showed good visualization of the distal biliary tree with no sign of filling defects or obstruction.  Some contrast headed proximally towards the liver, but there did not seem to be enough back pressure to entirely fill the biliary tree.Contrast flowed easily into the duodenum. The catheter was then removed.   The cystic duct was then ligated with clips and divided. The cystic artery was identified, dissected free, ligated with  clips and divided as well.  The gallbladder was dissected from the liver bed in retrograde fashion with the electrocautery. The gallbladder was removed and placed in an Endocatch sac. The liver bed was irrigated and inspected. Hemostasis was achieved with the electrocautery and Surgicel SNOW. Copious irrigation was utilized and was repeatedly aspirated until clear.  The gallbladder and Endocatch sac were then removed through the umbilical port site.  The pursestring suture was used to close the umbilical fascia.    We again inspected the right upper quadrant for hemostasis.  Pneumoperitoneum was released as we removed the trocars.  4-0 Monocryl was used to close the skin.   Benzoin, steri-strips, and clean dressings were applied. The patient was then extubated and brought to the recovery room in stable condition. Instrument, sponge, and needle counts were correct at closure and at the conclusion of the case.   Findings: Cholecystitis with Cholelithiasis  Estimated Blood Loss: Minimal         Drains: none         Specimens: Gallbladder           Complications: None; patient tolerated the procedure well.         Disposition: PACU - hemodynamically stable.         Condition: stable  [name]. Georgette Dover, MD, [facility]  General/ Trauma Surgery  05/01/2017 12:34 PM

## 2017-05-01 NOTE — Discharge Instructions (Signed)
[facility] ______[facility] °LAPAROSCOPIC SURGERY: POST OP INSTRUCTIONS °Always review your discharge instruction sheet given to you by the facility where your surgery was performed. °IF YOU HAVE DISABILITY OR FAMILY LEAVE FORMS, YOU MUST BRING THEM TO THE OFFICE FOR PROCESSING.   °DO NOT GIVE THEM TO YOUR DOCTOR. ° °1. A prescription for pain medication may be given to you upon discharge.  Take your pain medication as prescribed, if needed.  If narcotic pain medicine is not needed, then you may take acetaminophen (Tylenol) or ibuprofen (Advil) as needed. °2. Take your usually prescribed medications unless otherwise directed. °3. If you need a refill on your pain medication, please contact your pharmacy.  They will contact our office to request authorization. Prescriptions will not be filled after 5pm or on week-ends. °4. You should follow a light diet the first few days after arrival home, such as soup and crackers, etc.  Be sure to include lots of fluids daily. °5. Most patients will experience some swelling and bruising in the area of the incisions.  Ice packs will help.  Swelling and bruising can take several days to resolve.  °6. It is common to experience some constipation if taking pain medication after surgery.  Increasing fluid intake and taking a stool softener (such as Colace) will usually help or prevent this problem from occurring.  A mild laxative (Milk of Magnesia or Miralax) should be taken according to package instructions if there are no bowel movements after 48 hours. °7. Unless discharge instructions indicate otherwise, you may remove your bandages 24-48 hours after surgery, and you may shower at that time.  You may have steri-strips (small skin tapes) in place directly over the incision.  These strips should be left on the skin for 7-10 days.  If your surgeon used skin glue on the incision, you may shower in 24 hours.  The glue will flake off over the next 2-3 weeks.  Any sutures or  staples will be removed at the office during your follow-up visit. °8. ACTIVITIES:  You may resume regular (light) daily activities beginning the next day--such as daily self-care, walking, climbing stairs--gradually increasing activities as tolerated.  You may have sexual intercourse when it is comfortable.  Refrain from any heavy lifting or straining until approved by your doctor. °a. You may drive when you are no longer taking prescription pain medication, you can comfortably wear a seatbelt, and you can safely maneuver your car and apply brakes. °b. RETURN TO WORK:  __________________________________________________________ °9. You should see your doctor in the office for a follow-up appointment approximately 2-3 weeks after your surgery.  Make sure that you call for this appointment within a day or two after you arrive home to insure a convenient appointment time. °10. OTHER INSTRUCTIONS: __________________________________________________________________________________________________________________________ __________________________________________________________________________________________________________________________ °WHEN TO CALL YOUR DOCTOR: °1. Fever over 101.0 °2. Inability to urinate °3. Continued bleeding from incision. °4. Increased pain, redness, or drainage from the incision. °5. Increasing abdominal pain ° °The clinic staff is available to answer your questions during regular business hours.  Please don’t hesitate to call and ask to speak to one of the nurses for clinical concerns.  If you have a medical emergency, go to the nearest emergency room or call 911.  A surgeon from [facility] is always on call at the hospital. °[address], [address], [place], [place]  [address] ? [address] [address], [place], [place]   [address] °[phone] ? [phone] ? FAX [phone] °Web site:   [email or web address] °

## 2017-05-01 NOTE — Progress Notes (Signed)
Patient's CBG 68, patient not symptomatic.  Dr. Ambrose Pancoast notified.  No new orders received.  Will continue to monitor patient.

## 2017-05-01 NOTE — Anesthesia Procedure Notes (Signed)
Procedure Name: Intubation Date/Time: 05/01/2017 10:53 AM Performed by: Leonor Liv, CRNA Pre-anesthesia Checklist: Patient identified, Emergency Drugs available, Suction available and Patient being monitored Patient Re-evaluated:Patient Re-evaluated prior to induction Oxygen Delivery Method: Circle System Utilized Preoxygenation: Pre-oxygenation with 100% oxygen Induction Type: IV induction Ventilation: Mask ventilation without difficulty Laryngoscope Size: Mac and 3 Grade View: Grade I Tube type: Oral Tube size: 7.0 mm Number of attempts: 1 Airway Equipment and Method: Stylet and Oral airway Placement Confirmation: ETT inserted through vocal cords under direct vision,  positive ETCO2 and breath sounds checked- equal and bilateral Secured at: 22 cm Tube secured with: Tape Dental Injury: Teeth and Oropharynx as per pre-operative assessment and Injury to lip

## 2017-05-02 ENCOUNTER — Encounter (HOSPITAL_COMMUNITY): Payer: Self-pay | Admitting: Surgery

## 2017-05-02 NOTE — Anesthesia Postprocedure Evaluation (Signed)
Anesthesia Post Note  Patient: [name]  Procedure(s) Performed: LAPAROSCOPIC CHOLECYSTECTOMY WITH INTRAOPERATIVE CHOLANGIOGRAM (N/A Abdomen)     Patient location during evaluation: PACU Anesthesia Type: General Level of consciousness: awake and alert Pain management: pain level controlled Vital Signs Assessment: post-procedure vital signs reviewed and stable Respiratory status: spontaneous breathing, nonlabored ventilation, respiratory function stable and patient connected to nasal cannula oxygen Cardiovascular status: blood pressure returned to baseline and stable Postop Assessment: no apparent nausea or vomiting Anesthetic complications: no    Last Vitals:  Vitals:   05/01/17 1321 05/01/17 1350  BP: (!) 159/75 (!) 147/74  Pulse: 79 73  Resp:  16  Temp: 36.6 C   SpO2: 97% 100%    Last Pain:  Vitals:   05/01/17 1349  TempSrc:   PainSc: [place] Kawahara

## 2017-05-07 ENCOUNTER — Ambulatory Visit: Payer: Self-pay | Admitting: *Deleted

## 2017-05-07 NOTE — Telephone Encounter (Signed)
Pt states she had her gallbladder removed on 4/17 and since that time she has noticed that her blood glucose readings have been staying "too low". Pt states she has had a decreased appetite and has not been eating like she was before having surgery  due to pain. Pt is currently checking her blood glucose three times a day. Pt states this morning before breakfast her glucose was 60, at that time the pt states she ate a boiled egg, toast and coffee. When she checked her glucose at 1:30pm today it was 77. Pt states at times in the morning she would wake up sweating and feeling jittery. While on the phone with triage nurse blood glucose was 113. Pt states on yesterday readings were 63, 58 and 79. Pt is currently taking Amaryl 4 mg twice a day, Metformin 1000 mg twice a day and Lantus 55 units at night. Pt given home care advice for treatment of low blood glucose if it occurs again. Earliest appt available for Dr. Tamala Julian was on 4/29. Pt advised to call office back if symptoms worsened before seen in office for appt. Pt verbalized understanding.  Reason for Disposition . [1] Blood glucose < 70 mg/dl (3.9 mmol/l) or symptomatic AND [2] cause known . [1] Morning (before breakfast) blood glucose < 80 mg/dl (4.5 mmol/L) AND [2] more than once in past week  Answer Assessment - Initial Assessment Questions 1. SYMPTOMS: "What symptoms are you concerned about?"      2. ONSET:  "When did the symptoms start?"      3. BLOOD GLUCOSE: "What is your blood glucose level?"      77 at lunch time 4. USUAL RANGE: "What is your blood glucose level usually?" (e.g., usual fasting morning value, usual evening value)      5. TYPE 1 or 2:  "Do you know what type of diabetes you have?"  (e.g., Type 1, Type 2, Gestational; doesn't know)      Type 2 DM 6. INSULIN: "Do you take insulin?"      Lantus 7. DIABETES PILLS: "Do you take any pills for your diabetes?"     yes 8. OTHER SYMPTOMS: "Do you have any symptoms?" (e.g., fever,  frequent urination, difficulty breathing, vomiting)     Amaryl and Metformin  9. LOW BLOOD GLUCOSE TREATMENT: "What have you done so far to treat the low blood glucose level?"     Try to find something to eat, boiled egg, toast and coffee with small cup of applesauce and a small piece of cake 10. ALONE: "Are you alone right now or is someone with you?"         11. PREGNANCY: "Is there any chance you are pregnant?" "When was your last menstrual period?"       n/a  Protocols used: DIABETES - LOW BLOOD SUGAR-A-AH

## 2017-05-09 ENCOUNTER — Ambulatory Visit (INDEPENDENT_AMBULATORY_CARE_PROVIDER_SITE_OTHER): Payer: Medicare Other | Admitting: Physician Assistant

## 2017-05-09 ENCOUNTER — Encounter: Payer: Self-pay | Admitting: Physician Assistant

## 2017-05-09 ENCOUNTER — Other Ambulatory Visit: Payer: Self-pay

## 2017-05-09 VITALS — BP 110/64 | Wt 164.4 lb

## 2017-05-09 DIAGNOSIS — Z794 Long term (current) use of insulin: Secondary | ICD-10-CM

## 2017-05-09 DIAGNOSIS — E162 Hypoglycemia, unspecified: Secondary | ICD-10-CM | POA: Diagnosis not present

## 2017-05-09 DIAGNOSIS — E119 Type 2 diabetes mellitus without complications: Secondary | ICD-10-CM

## 2017-05-09 LAB — GLUCOSE, POCT (MANUAL RESULT ENTRY): POC GLUCOSE: 68 mg/dL — AB (ref 70–99)

## 2017-05-09 NOTE — Progress Notes (Signed)
[name]  MRN: [id number] DOB: 12/03/1945  Subjective:  [name] is a 72 y.o. female seen in office today for a chief complaint of low blood sugars since laproscopic cholecystectomy on 05/01/2017.Patient has PMH of type 2 diabetes.  Controlled on metformin 1000 mg twice daily, glimepiride 4 mg twice daily, and insulin 55 units at nighttime.  Last A1c on 04/16/2017 was 8.5.  Prior to surgery, fasting blood sugars range between 80 and 160.  Since surgery on 4/17, fasting blood sugars have been ranging between 58-80s. When it gets really low, she will feel jittery and sweaty.  She denies, dizziness, confusion, blurred vision, syncope, weakness, tingling, headache, and chest pain.Has continued to take diabetes medication as prescribed.  Notes she has not been eating much since the surgery due to decreased appetite.  Review of Systems  Constitutional: Positive for appetite change. Negative for chills and fever.  Eyes: Negative for visual disturbance.  Gastrointestinal: Negative for abdominal pain, nausea and vomiting.  Neurological: Negative for tremors.    Patient Active Problem List   Diagnosis Date Noted  . Pure hypercholesterolemia 09/10/2016  . Thyroid nodule 09/10/2016  . COPD (chronic obstructive pulmonary disease) ([place]) 09/10/2016  . Family history of colon cancer in mother 06/06/2016  . Nuclear sclerotic cataract of both eyes 04/08/2013  . Type 2 diabetes mellitus without complication, with long-term current use of insulin ([place]) 09/13/2011  . HTN (hypertension) 09/13/2011  . Tobacco user 09/13/2011  . DDD (degenerative disc disease), lumbar 09/13/2011  . History of rectal polypectomy 08/31/2010    Current Outpatient Medications on File Prior to Visit  Medication Sig Dispense Refill  . aspirin 81 MG tablet Take 81 mg by mouth daily.      . blood glucose meter kit and supplies KIT Dispense based on patient and insurance preference. Use up to four times daily as  directed. (FOR ICD-9 250.00, 250.01). 1 each 0  . Cholecalciferol (VITAMIN D3) 1000 UNITS CAPS Take 2 capsules by mouth every morning.     . fluticasone furoate-vilanterol (BREO ELLIPTA) 100-25 MCG/INH AEPB Inhale 1 puff into the lungs daily. 90 each 3  . glimepiride (AMARYL) 4 MG tablet Take 1 tablet (4 mg total) by mouth 2 (two) times daily with a meal. 180 tablet 3  . Insulin Glargine (LANTUS SOLOSTAR) 100 UNIT/ML Solostar Pen Inject 55 Units into the skin daily at 10 pm. 30 pen 5  . lisinopril (PRINIVIL,ZESTRIL) 10 MG tablet Take 1 tablet (10 mg total) by mouth daily. 90 tablet 3  . metFORMIN (GLUCOPHAGE) 1000 MG tablet Take 1 tablet (1,000 mg total) by mouth 2 (two) times daily with a meal. 180 tablet 3  . metoprolol succinate (TOPROL-XL) 25 MG 24 hr tablet Take 1 tablet (25 mg total) by mouth daily. 90 tablet 3  . Needles & Syringes MISC Insulin needles and syringes 100 each 3  . ondansetron (ZOFRAN) 4 MG tablet Take 1 tablet (4 mg total) by mouth every 6 (six) hours as needed for nausea. 12 tablet 0  . ondansetron (ZOFRAN-ODT) 8 MG disintegrating tablet Take 1 tablet (8 mg total) by mouth every 8 (eight) hours as needed for nausea or vomiting. 20 tablet 0  . oxyCODONE (OXY IR/ROXICODONE) 5 MG immediate release tablet Take 1 tablet (5 mg total) by mouth every 6 (six) hours as needed for severe pain. 20 tablet 0  . oxyCODONE-acetaminophen (PERCOCET) 5-325 MG tablet Take 1 tablet by mouth every 4 (four) hours as needed for moderate  pain. 15 tablet 0  . pravastatin (PRAVACHOL) 40 MG tablet Take 1 tablet by mouth  every evening after a meal 90 tablet 3  . tiotropium (SPIRIVA) 18 MCG inhalation capsule Place 1 capsule (18 mcg total) into inhaler and inhale daily. 90 capsule 3  . Albuterol Sulfate (PROAIR RESPICLICK) 631 (90 Base) MCG/ACT AEPB Inhale 2 Act into the lungs 4 (four) times daily as needed. 3 each 1  . hydrocortisone 2.5 % ointment Apply topically 2 (two) times daily. 30 g 0  . nystatin  cream (MYCOSTATIN) Apply 1 application topically 3 (three) times daily. 30 g 1   No current facility-administered medications on file prior to visit.     No Known Allergies   Objective:  BP 110/64   Wt 164 lb 6.4 oz (74.6 kg)   SpO2 94%   BMI 25.75 kg/m   Physical Exam  Constitutional: She is oriented to person, place, and time. She appears well-developed and well-nourished. No distress.  HENT:  Head: Normocephalic and atraumatic.  Mouth/Throat: Uvula is midline, oropharynx is clear and moist and mucous membranes are normal.  Eyes: Pupils are equal, round, and reactive to light. Conjunctivae and EOM are normal.  Neck: Normal range of motion.  Cardiovascular: Normal rate, regular rhythm, normal heart sounds and intact distal pulses.  Pulmonary/Chest: Effort normal and breath sounds normal.  Neurological: She is alert and oriented to person, place, and time. She has normal strength and normal reflexes. No cranial nerve deficit or sensory deficit.  Skin: Skin is warm and dry.  Psychiatric: She has a normal mood and affect.  Vitals reviewed.   POC glucose 68  Assessment and Plan :  This case was precepted with Dr. Pamella Pert. 1. Hypoglycemia POC glucose 68.  Patient is asymptomatic. She is well appearing, no distress. Normal neuro exam.  Vitals were reviewed and normal.  Unfortunately, CMA did not enter in all vitals into the computer system.  Hypoglycemia likely due to decreased oral intake.  Recommend discontinuing Amaryl completely.  Decrease metformin dose to 500 mg twice daily.  Decrease insulin to 27 units at night.  Check fasting blood sugar  in the morning, goal is around 140-150.  Educated patient that if fasting blood sugars are still in the 50s to 70s with this regimen after 3 days, decrease insulin again by half.  We will follow-up with Dr. Tamala Julian on 05/13/2017.  Given strict return/ED precautions. - POCT glucose (manual entry) - CMP14+EGFR  2. Type 2 diabetes mellitus  without complication, with long-term current use of insulin ([name])  [name] PA-C  [facility] 05/14/2017 1:43 PM

## 2017-05-09 NOTE — Patient Instructions (Addendum)
Until you start eating again and your sugars start increasing: I recommend discontinuing amaryl completely Decrease metformin to '500mg'$  twice daily Decrease insulin by half so that means you should take about 27 Units at night.   Fasting blood sugar goal at this time should be ~140-150.   If still having low sugars in the 50-70s with this regimen after 3 days, cut insulin dose into half again.  As you start to eat better and your sugars go up, you can add back metformin and insulin slowly. Hold off on adding back amaryl until you see Dr. Tamala Julian.  I recommend following up with Dr. Tamala Julian, in 5-7 days for repeat glucose in office.   If your sugar drops too low and you start having symptoms, please seek care immediately at the ED.  Thank you for letting me participate in your health and well being.  Hypoglycemia Hypoglycemia is when the sugar (glucose) level in the blood is too low. Symptoms of low blood sugar may include:  Feeling: ? Hungry. ? Worried or nervous (anxious). ? Sweaty and clammy. ? Confused. ? Dizzy. ? Sleepy. ? Sick to your stomach (nauseous).  Having: ? A fast heartbeat. ? A headache. ? A change in your vision. ? Jerky movements that you cannot control (seizure). ? Nightmares. ? Tingling or no feeling (numbness) around the mouth, lips, or tongue.  Having trouble with: ? Talking. ? Paying attention (concentrating). ? Moving (coordination). ? Sleeping.  Shaking.  Passing out (fainting).  Getting upset easily (irritability).  Low blood sugar can happen to people who have diabetes and people who do not have diabetes. Low blood sugar can happen quickly, and it can be an emergency. Treating Low Blood Sugar Low blood sugar is often treated by eating or drinking something sugary right away. If you can think clearly and swallow safely, follow the 15:15 rule:  Take 15 grams of a fast-acting carb (carbohydrate). Some fast-acting carbs are: ? 1 tube of glucose  gel. ? 3 sugar tablets (glucose pills). ? 6-8 pieces of hard candy. ? 4 oz (120 mL) of fruit juice. ? 4 oz (120 mL) of regular (not diet) soda.  Check your blood sugar 15 minutes after you take the carb.  If your blood sugar is still at or below 70 mg/dL (3.9 mmol/L), take 15 grams of a carb again.  If your blood sugar does not go above 70 mg/dL (3.9 mmol/L) after 3 tries, get help right away.  After your blood sugar goes back to normal, eat a meal or a snack within 1 hour.  Treating Very Low Blood Sugar If your blood sugar is at or below 54 mg/dL (3 mmol/L), you have very low blood sugar (severe hypoglycemia). This is an emergency. Do not wait to see if the symptoms will go away. Get medical help right away. Call your local emergency services (911 in the [place] Do not drive yourself to the hospital. If you have very low blood sugar and you cannot eat or drink, you may need a glucagon shot (injection). A family member or friend should learn how to check your blood sugar and how to give you a glucagon shot. Ask your doctor if you need to have a glucagon shot kit at home. Follow these instructions at home: General instructions  Avoid any diets that cause you to not eat enough food. Talk with your doctor before you start any new diet.  Take over-the-counter and prescription medicines only as told by your doctor.  Limit alcohol to no more than 1 drink per day for nonpregnant women and 2 drinks per day for men. One drink equals 12 oz of beer, 5 oz of wine, or 1 oz of hard liquor.  Keep all follow-up visits as told by your doctor. This is important. If You Have Diabetes:   Make sure you know the symptoms of low blood sugar.  Always keep a source of sugar with you, such as: ? Sugar. ? Sugar tablets. ? Glucose gel. ? Fruit juice. ? Regular soda (not diet soda). ? Milk. ? Hard candy. ? Honey.  Take your medicines as told.  Follow your exercise and meal plan. ? Eat on time. Do not  skip meals. ? Follow your sick day plan when you cannot eat or drink normally. Make this plan ahead of time with your doctor.  Check your blood sugar as often as told by your doctor. Always check before and after exercise.  Share your diabetes care plan with: ? Your work or school. ? People you live with.  Check your pee (urine) for ketones: ? When you are sick. ? As told by your doctor.  Carry a card or wear jewelry that says you have diabetes. If You Have Low Blood Sugar From Other Causes:   Check your blood sugar as often as told by your doctor.  Follow instructions from your doctor about what you cannot eat or drink. Contact a doctor if:  You have trouble keeping your blood sugar in your target range.  You have low blood sugar often. Get help right away if:  You still have symptoms after you eat or drink something sugary.  Your blood sugar is at or below 54 mg/dL (3 mmol/L).  You have jerky movements that you cannot control.  You pass out. These symptoms may be an emergency. Do not wait to see if the symptoms will go away. Get medical help right away. Call your local emergency services (911 in the [place] Do not drive yourself to the hospital. This information is not intended to replace advice given to you by your health care provider. Make sure you discuss any questions you have with your health care provider. Document Released: 03/28/2009 Document Revised: 06/09/2015 Document Reviewed: 02/04/2015 Elsevier Interactive Patient Education  2018 [employer].     IF you received an x-ray today, you will receive an invoice from [facility]. Please contact [facility] at [phone] with questions or concerns regarding your invoice.   IF you received labwork today, you will receive an invoice from [facility]. Please contact [facility] at [phone] with questions or concerns regarding your invoice.   Our billing staff will not be able to assist you with  questions regarding bills from these companies.  You will be contacted with the lab results as soon as they are available. The fastest way to get your results is to activate your My Chart account. Instructions are located on the last page of this paperwork. If you have not heard from [place] regarding the results in 2 weeks, please contact this office.

## 2017-05-10 ENCOUNTER — Ambulatory Visit (INDEPENDENT_AMBULATORY_CARE_PROVIDER_SITE_OTHER): Payer: Medicare Other | Admitting: Physician Assistant

## 2017-05-10 DIAGNOSIS — Z794 Long term (current) use of insulin: Secondary | ICD-10-CM | POA: Diagnosis not present

## 2017-05-10 DIAGNOSIS — E119 Type 2 diabetes mellitus without complications: Secondary | ICD-10-CM

## 2017-05-10 LAB — GLUCOSE, POCT (MANUAL RESULT ENTRY): POC Glucose: 137 mg/dl — AB (ref 70–99)

## 2017-05-10 LAB — CMP14+EGFR
ALT: 16 IU/L (ref 0–32)
AST: 21 IU/L (ref 0–40)
Albumin/Globulin Ratio: 1.1 — ABNORMAL LOW (ref 1.2–2.2)
Albumin: 3.5 g/dL (ref 3.5–4.8)
Alkaline Phosphatase: 189 IU/L — ABNORMAL HIGH (ref 39–117)
BUN/Creatinine Ratio: 12 (ref 12–28)
BUN: 10 mg/dL (ref 8–27)
Bilirubin Total: 0.4 mg/dL (ref 0.0–1.2)
CALCIUM: 9.5 mg/dL (ref 8.7–10.3)
CO2: 22 mmol/L (ref 20–29)
CREATININE: 0.82 mg/dL (ref 0.57–1.00)
Chloride: 103 mmol/L (ref 96–106)
GFR calc Af Amer: 83 mL/min/{1.73_m2} (ref >59)
GFR, EST NON AFRICAN AMERICAN: 72 mL/min/{1.73_m2} (ref >59)
Globulin, Total: 3.3 g/dL (ref 1.5–4.5)
Glucose: 26 mg/dL — CL (ref 65–99)
Potassium: 4.3 mmol/L (ref 3.5–5.2)
Sodium: 146 mmol/L — ABNORMAL HIGH (ref 134–144)
TOTAL PROTEIN: 6.8 g/dL (ref 6.0–8.5)

## 2017-05-13 ENCOUNTER — Encounter: Payer: Self-pay | Admitting: Family Medicine

## 2017-05-13 ENCOUNTER — Other Ambulatory Visit: Payer: Self-pay

## 2017-05-13 ENCOUNTER — Ambulatory Visit (INDEPENDENT_AMBULATORY_CARE_PROVIDER_SITE_OTHER): Payer: Medicare Other | Admitting: Family Medicine

## 2017-05-13 ENCOUNTER — Ambulatory Visit: Payer: Self-pay | Admitting: Family Medicine

## 2017-05-13 VITALS — BP 122/72 | HR 103 | Temp 98.0°F | Resp 16 | Wt 164.0 lb

## 2017-05-13 DIAGNOSIS — Z9049 Acquired absence of other specified parts of digestive tract: Secondary | ICD-10-CM

## 2017-05-13 DIAGNOSIS — Z794 Long term (current) use of insulin: Secondary | ICD-10-CM

## 2017-05-13 DIAGNOSIS — E119 Type 2 diabetes mellitus without complications: Secondary | ICD-10-CM

## 2017-05-13 DIAGNOSIS — E162 Hypoglycemia, unspecified: Secondary | ICD-10-CM

## 2017-05-13 LAB — CBC WITH DIFFERENTIAL/PLATELET
BASOS ABS: 0 10*3/uL (ref 0.0–0.2)
BASOS: 0 %
EOS (ABSOLUTE): 0.1 10*3/uL (ref 0.0–0.4)
Eos: 1 %
HEMOGLOBIN: 12.9 g/dL (ref 11.1–15.9)
Hematocrit: 39.4 % (ref 34.0–46.6)
IMMATURE GRANS (ABS): 0 10*3/uL (ref 0.0–0.1)
IMMATURE GRANULOCYTES: 0 %
Lymphocytes Absolute: 2.9 10*3/uL (ref 0.7–3.1)
Lymphs: 27 %
MCH: 27.3 pg (ref 26.6–33.0)
MCHC: 32.7 g/dL (ref 31.5–35.7)
MCV: 84 fL (ref 79–97)
MONOCYTES: 8 %
Monocytes Absolute: 0.8 10*3/uL (ref 0.1–0.9)
NEUTROS PCT: 64 %
Neutrophils Absolute: 7 10*3/uL (ref 1.4–7.0)
PLATELETS: 427 10*3/uL — AB (ref 150–379)
RBC: 4.72 x10E6/uL (ref 3.77–5.28)
RDW: 14.2 % (ref 12.3–15.4)
WBC: 10.9 10*3/uL — ABNORMAL HIGH (ref 3.4–10.8)

## 2017-05-13 LAB — POCT URINALYSIS DIP (MANUAL ENTRY)
Blood, UA: NEGATIVE
Glucose, UA: NEGATIVE mg/dL
LEUKOCYTES UA: NEGATIVE
NITRITE UA: NEGATIVE
PH UA: 5.5 (ref 5.0–8.0)
Spec Grav, UA: 1.02 (ref 1.010–1.025)
Urobilinogen, UA: 2 E.U./dL — AB

## 2017-05-13 LAB — COMPREHENSIVE METABOLIC PANEL
ALT: 18 IU/L (ref 0–32)
AST: 22 IU/L (ref 0–40)
Albumin/Globulin Ratio: 1.1 — ABNORMAL LOW (ref 1.2–2.2)
Albumin: 3.3 g/dL — ABNORMAL LOW (ref 3.5–4.8)
Alkaline Phosphatase: 245 IU/L — ABNORMAL HIGH (ref 39–117)
BUN/Creatinine Ratio: 11 — ABNORMAL LOW (ref 12–28)
BUN: 10 mg/dL (ref 8–27)
Bilirubin Total: 0.3 mg/dL (ref 0.0–1.2)
CALCIUM: 9.6 mg/dL (ref 8.7–10.3)
CO2: 23 mmol/L (ref 20–29)
Chloride: 99 mmol/L (ref 96–106)
Creatinine, Ser: 0.91 mg/dL (ref 0.57–1.00)
GFR, EST AFRICAN AMERICAN: 73 mL/min/{1.73_m2} (ref >59)
GFR, EST NON AFRICAN AMERICAN: 63 mL/min/{1.73_m2} (ref >59)
GLUCOSE: 126 mg/dL — AB (ref 65–99)
Globulin, Total: 3 g/dL (ref 1.5–4.5)
Potassium: 4.6 mmol/L (ref 3.5–5.2)
Sodium: 142 mmol/L (ref 134–144)
TOTAL PROTEIN: 6.3 g/dL (ref 6.0–8.5)

## 2017-05-13 LAB — GLUCOSE, POCT (MANUAL RESULT ENTRY): POC GLUCOSE: 137 mg/dL — AB (ref 70–99)

## 2017-05-13 NOTE — Patient Instructions (Addendum)
DECREASE INSULIN TO 22 UNITS DAILY.  Laparoscopic Cholecystectomy, Care After This sheet gives you information about how to care for yourself after your procedure. Your doctor may also give you more specific instructions. If you have problems or questions, contact your doctor. Follow these instructions at home: Care for cuts from surgery (incisions)   Follow instructions from your doctor about how to take care of your cuts from surgery. Make sure you: ? Wash your hands with soap and water before you change your bandage (dressing). If you cannot use soap and water, use hand sanitizer. ? Change your bandage as told by your doctor. ? Leave stitches (sutures), skin glue, or skin tape (adhesive) strips in place. They may need to stay in place for 2 weeks or longer. If tape strips get loose and curl up, you may trim the loose edges. Do not remove tape strips completely unless your doctor says it is okay.  Do not take baths, swim, or use a hot tub until your doctor says it is okay. Ask your doctor if you can take showers. You may only be allowed to take sponge baths for bathing.  Check your surgical cut area every day for signs of infection. Check for: ? More redness, swelling, or pain. ? More fluid or blood. ? Warmth. ? Pus or a bad smell. Activity  Do not drive or use heavy machinery while taking prescription pain medicine.  Do not lift anything that is heavier than 10 lb (4.5 kg) until your doctor says it is okay.  Do not play contact sports until your doctor says it is okay.  Do not drive for 24 hours if you were given a medicine to help you relax (sedative).  Rest as needed. Do not return to work or school until your doctor says it is okay. General instructions  Take over-the-counter and prescription medicines only as told by your doctor.  To prevent or treat constipation while you are taking prescription pain medicine, your doctor may recommend that you: ? Drink enough fluid to  keep your pee (urine) clear or pale yellow. ? Take over-the-counter or prescription medicines. ? Eat foods that are high in fiber, such as fresh fruits and vegetables, whole grains, and beans. ? Limit foods that are high in fat and processed sugars, such as fried and sweet foods. Contact a doctor if:  You develop a rash.  You have more redness, swelling, or pain around your surgical cuts.  You have more fluid or blood coming from your surgical cuts.  Your surgical cuts feel warm to the touch.  You have pus or a bad smell coming from your surgical cuts.  You have a fever.  One or more of your surgical cuts breaks open. Get help right away if:  You have trouble breathing.  You have chest pain.  You have pain that is getting worse in your shoulders.  You faint or feel dizzy when you stand.  You have very bad pain in your belly (abdomen).  You are sick to your stomach (nauseous) for more than one day.  You have throwing up (vomiting) that lasts for more than one day.  You have leg pain. This information is not intended to replace advice given to you by your health care provider. Make sure you discuss any questions you have with your health care provider. Document Released: 10/11/2007 Document Revised: 07/23/2015 Document Reviewed: 06/20/2015 Elsevier Interactive Patient Education  2018 [employer].    IF you received an x-ray  today, you will receive an invoice from [facility]. Please contact [facility] at [phone] with questions or concerns regarding your invoice.   IF you received labwork today, you will receive an invoice from [facility]. Please contact [facility] at [phone] with questions or concerns regarding your invoice.   Our billing staff will not be able to assist you with questions regarding bills from these companies.  You will be contacted with the lab results as soon as they are available. The fastest way to get your results is to  activate your My Chart account. Instructions are located on the last page of this paperwork. If you have not heard from [place] regarding the results in 2 weeks, please contact this office.

## 2017-05-13 NOTE — Progress Notes (Signed)
Lab visit only.  Did not see provider.  

## 2017-05-13 NOTE — Progress Notes (Signed)
Subjective:    Patient ID: [name], female    DOB: 1945/01/20, 72 y.o.   MRN: [id number]  05/13/2017  Diabetes (follw-up from 4/25)    HPI This 72 y.o. female presents for evaluation HYPOGLYCEMIA.  Management changes made at last visit include the following:  Until you start eating again and your sugars start increasing: I recommend discontinuing amaryl completely Decrease metformin to '500mg'$  twice daily Decrease insulin by half so that means you should take about 27 Units at night.   UPDATE:  Ten pounds lighter.   Saw [place] due to hypoglycemia; nothing tastes good.   Sugar this morning 102. Sugars ranging 60-199.  One isolated high sugar of 199 was due to [employer] sausage egg mcgriddle and coffee.  [phone].   Stopped Amaryl; taking Metformin 1/2 bid.  Taking 27 units daily.   S/p cholecystectomy two weeks ago.  Skipping lunch.  Mostly nibbling.   Fruit, applesauce.  Avoiding grease.  Eating bananas, jello.  Will eat an egg.  Favorite egg is over easy and wheat toast.  Pork sausage patty.  Afraid right now.   Body is changing since surgery. Suffering with sweats and chills.  Daughter feels like patient suffering with anxiety.  Taking a toll on body; got hot; could not stop sweating.  Excessive sweating.  Temperature 99.0 max.     BP Readings from Last 3 Encounters:  05/13/17 122/72  05/09/17 110/64  05/01/17 (!) 147/74   Wt Readings from Last 3 Encounters:  05/13/17 164 lb (74.4 kg)  05/09/17 164 lb 6.4 oz (74.6 kg)  05/01/17 169 lb (76.7 kg)   Immunization History  Administered Date(s) Administered  . Influenza Split 09/28/2011, 09/15/2012, 09/23/2013  . Influenza,inj,Quad PF,6+ Mos 09/21/2014, 09/11/2016  . Influenza-Unspecified 09/29/2015  . Pneumococcal Conjugate-13 02/07/2015  . Pneumococcal Polysaccharide-23 12/20/2011  . Tdap 09/21/2014  . Zoster 04/17/2012    Review of Systems  Constitutional: Positive for activity  change, appetite change, chills, diaphoresis and unexpected weight change. Negative for fatigue and fever.  Eyes: Negative for visual disturbance.  Respiratory: Negative for cough and shortness of breath.   Cardiovascular: Negative for chest pain, palpitations and leg swelling.  Gastrointestinal: Negative for abdominal pain, constipation, diarrhea, nausea and vomiting.  Endocrine: Negative for cold intolerance, heat intolerance, polydipsia, polyphagia and polyuria.  Neurological: Negative for dizziness, tremors, seizures, syncope, facial asymmetry, speech difficulty, weakness, light-headedness, numbness and headaches.  Psychiatric/Behavioral: The patient is nervous/anxious.     Past Medical History:  Diagnosis Date  . COPD (chronic obstructive pulmonary disease) ([place])   . Diabetes mellitus   . Hyperlipidemia   . Hypertension   . Night sweats   . Rectal polyp    Past Surgical History:  Procedure Laterality Date  . ABDOMINAL HYSTERECTOMY  2000   fibroids; ovaries intact.  Marland Kitchen BACK SURGERY     plate put in back  . CHOLECYSTECTOMY N/A 05/01/2017   Procedure: LAPAROSCOPIC CHOLECYSTECTOMY WITH INTRAOPERATIVE CHOLANGIOGRAM;  Surgeon: Donnie Mesa, MD;  Location: [facility];  Service: General;  Laterality: N/A;  . 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 TUBAL LIGATION     No Known Allergies Current Outpatient Medications on File Prior to Visit  Medication Sig Dispense Refill  . Albuterol Sulfate (PROAIR RESPICLICK) 017 (90 Base) MCG/ACT AEPB Inhale 2 Act into the lungs 4 (four) times daily as needed. 3 each 1  . aspirin 81 MG tablet Take 81 mg by mouth daily.      Marland Kitchen  blood glucose meter kit and supplies KIT Dispense based on patient and insurance preference. Use up to four times daily as directed. (FOR ICD-9 250.00, 250.01). 1 each 0  . Cholecalciferol (VITAMIN D3) 1000 UNITS CAPS Take 2 capsules by mouth every morning.     . fluticasone furoate-vilanterol (BREO ELLIPTA) 100-25  MCG/INH AEPB Inhale 1 puff into the lungs daily. 90 each 3  . glimepiride (AMARYL) 4 MG tablet Take 1 tablet (4 mg total) by mouth 2 (two) times daily with a meal. 180 tablet 3  . hydrocortisone 2.5 % ointment Apply topically 2 (two) times daily. 30 g 0  . Insulin Glargine (LANTUS SOLOSTAR) 100 UNIT/ML Solostar Pen Inject 55 Units into the skin daily at 10 pm. 30 pen 5  . lisinopril (PRINIVIL,ZESTRIL) 10 MG tablet Take 1 tablet (10 mg total) by mouth daily. 90 tablet 3  . metFORMIN (GLUCOPHAGE) 1000 MG tablet Take 1 tablet (1,000 mg total) by mouth 2 (two) times daily with a meal. 180 tablet 3  . metoprolol succinate (TOPROL-XL) 25 MG 24 hr tablet Take 1 tablet (25 mg total) by mouth daily. 90 tablet 3  . Needles & Syringes MISC Insulin needles and syringes 100 each 3  . nystatin cream (MYCOSTATIN) Apply 1 application topically 3 (three) times daily. 30 g 1  . ondansetron (ZOFRAN) 4 MG tablet Take 1 tablet (4 mg total) by mouth every 6 (six) hours as needed for nausea. 12 tablet 0  . ondansetron (ZOFRAN-ODT) 8 MG disintegrating tablet Take 1 tablet (8 mg total) by mouth every 8 (eight) hours as needed for nausea or vomiting. 20 tablet 0  . oxyCODONE (OXY IR/ROXICODONE) 5 MG immediate release tablet Take 1 tablet (5 mg total) by mouth every 6 (six) hours as needed for severe pain. 20 tablet 0  . oxyCODONE-acetaminophen (PERCOCET) 5-325 MG tablet Take 1 tablet by mouth every 4 (four) hours as needed for moderate pain. 15 tablet 0  . pravastatin (PRAVACHOL) 40 MG tablet Take 1 tablet by mouth  every evening after a meal 90 tablet 3  . tiotropium (SPIRIVA) 18 MCG inhalation capsule Place 1 capsule (18 mcg total) into inhaler and inhale daily. 90 capsule 3   No current facility-administered medications on file prior to visit.    Social History   Socioeconomic History  . Marital status: Divorced    Spouse name: Not on file  . Number of children: 3  . Years of education: Not on file  . Highest  education level: Not on file  Occupational History  . Occupation: retired    Comment: [employer]  Social Needs  . Financial resource strain: Not on file  . Food insecurity:    Worry: Not on file    Inability: Not on file  . Transportation needs:    Medical: Not on file    Non-medical: Not on file  Tobacco Use  . Smoking status: Current Every Day Smoker    Packs/day: 0.50    Years: 50.00    Pack years: 25.00    Types: Cigarettes  . Smokeless tobacco: Never Used  Substance and Sexual Activity  . Alcohol use: No  . Drug use: No  . Sexual activity: Not on file  Lifestyle  . Physical activity:    Days per week: Not on file    Minutes per session: Not on file  . Stress: Not on file  Relationships  . Social connections:    Talks on phone: Not on file  Gets together: Not on file    Attends religious service: Not on file    Active member of club or organization: Not on file    Attends meetings of clubs or organizations: Not on file    Relationship status: Not on file  . Intimate partner violence:    Fear of current or ex partner: Not on file    Emotionally abused: Not on file    Physically abused: Not on file    Forced sexual activity: Not on file  Other Topics Concern  . Not on file  Social History Narrative   Marital status: divorced; not dating in 2018 but interested slightly.        Children:  3 children; 7 seven grandchildren; 1 gg      Lives: with oldest daughter, 2 grandchildren, 46 gg, 1 friend of daughters, daughter's boyfriend.       Employment: retired [employer] age 68      Tobacco; 1ppd x 50 years      Alcohol: none      Exercise: a little in 2018; tries to walk for 10-15 minutes per day.      ADLs:   independent with ADLs; no transportation in 2018.      Advanced Directives:    none   Family History  Problem Relation Age of Onset  . Diabetes Sister   . Diabetes Brother   . Hypertension Brother   . Cancer Mother 60       colon(age 46)  and lung (age 79)  . Diabetes Mother   . Heart disease Mother 70       CABG  . Hyperlipidemia Mother   . Cancer Father 25       stomach  . Diabetes Maternal Grandmother   . Mental illness Sister        Objective:    BP 122/72   Pulse (!) 103   Temp 98 F (36.7 C) (Oral)   Resp 16   Wt 164 lb (74.4 kg)   SpO2 98%   BMI 25.69 kg/m  Physical Exam  Constitutional: She is oriented to person, place, and time. She appears well-developed and well-nourished. No distress.  HENT:  Head: Normocephalic and atraumatic.  Eyes: Pupils are equal, round, and reactive to light. Conjunctivae are normal.  Neck: Normal range of motion. Neck supple.  Cardiovascular: Normal rate, regular rhythm and normal heart sounds. Exam reveals no gallop and no friction rub.  No murmur heard. Pulmonary/Chest: Effort normal and breath sounds normal. She has no wheezes. She has no rales.  Neurological: She is alert and oriented to person, place, and time. She displays normal reflexes. No cranial nerve deficit or sensory deficit. She exhibits normal muscle tone. Coordination normal.  Skin: She is not diaphoretic.  Psychiatric: She has a normal mood and affect. Her behavior is normal.  Nursing note and vitals reviewed.  No results found. Depression screen [facility] 2/9 05/13/2017 05/09/2017 04/17/2017 12/17/2016 09/11/2016  Decreased Interest 0 0 0 0 0  Down, Depressed, Hopeless 0 0 0 0 0  PHQ - 2 Score 0 0 0 0 0   Fall Risk  05/13/2017 05/09/2017 04/17/2017 12/17/2016 09/11/2016  Falls in the past year? No No No No No  Comment - - - - -  Number falls in past yr: - - - - -  Injury with Fall? - - - - -    Results for orders placed or performed in visit on 05/13/17  POCT  glucose (manual entry)  Result Value Ref Range   POC Glucose 137 (A) 70 - 99 mg/dl  POCT urinalysis dipstick  Result Value Ref Range   Color, UA straw (A) yellow   Clarity, UA clear clear   Glucose, UA negative negative mg/dL   Bilirubin, UA small (A)  negative   Ketones, POC UA trace (5) (A) negative mg/dL   Spec Grav, UA 1.020 1.010 - 1.025   Blood, UA negative negative   pH, UA 5.5 5.0 - 8.0   Protein Ur, POC =100 (A) negative mg/dL   Urobilinogen, UA 2.0 (A) 0.2 or 1.0 E.U./dL   Nitrite, UA Negative Negative   Leukocytes, UA Negative Negative       Assessment & Plan:   1. Hypoglycemia   2. Type 2 diabetes mellitus without complication, with long-term current use of insulin ([place])   3. S/P cholecystectomy     Hypoglycemia during postoperative period  following laparoscopic cholecystectomy due to decreased appetite.  Less frequent hypoglycemic episodes since visit earlier this week.  Decrease insulin to 22 units daily for the next month.  Continue to hold Amaryl therapy.  Continue decreased dose of metformin.  Close follow-up in 1 month for reevaluation.  Contact office if sugars elevate before next visit.  Contact office if continues to suffer with hypoglycemia.  Orders Placed This Encounter  Procedures  . CBC with Differential/Platelet  . Comprehensive metabolic panel  . POCT glucose (manual entry)  . POCT urinalysis dipstick   No orders of the defined types were placed in this encounter.   Return in about 1 month (around 06/10/2017) for recheck.   Cami Delawder Elayne Guerin, M.D. Primary Care at [facility] [address] [place], [place]  [phone] phone [phone] fax

## 2017-05-14 ENCOUNTER — Encounter: Payer: Self-pay | Admitting: Physician Assistant

## 2017-05-14 NOTE — Telephone Encounter (Signed)
Patient scheduled with PA Timmothy Euler for 05/10/17.  Follow-up with me on 05/13/17. No further action warranted.

## 2017-05-21 ENCOUNTER — Encounter: Payer: Self-pay | Admitting: Family Medicine

## 2017-05-26 NOTE — Telephone Encounter (Signed)
Per patient request, please call her.

## 2017-05-27 NOTE — Telephone Encounter (Signed)
Incoming call from patient. She is calling back to relay information about her blood sugars.   She takes three readings a day. The first number is her fasting, last two are right before she eats lunch and dinner - waits about 3-4 hours between meals.  Monday 5/6 - 160 , 250, 142 Tuesday 5/7 - 156, 275, 245 Wednedsay 5/8-  148, 224, 265 Thursday 5/9 - 208, 220, 188 Friday 5/10 - 176, 303, 211 Saturday 5/11 - 225, 273, 266 Sunday 5/12 - 210, 315, 177 Monday 5/13 - 173, 257  She confirms she is taking 22 units of insulin at night, and a 1/2 metformin 1000mg  bid. No glimepiride.   Provider, please advise.

## 2017-05-27 NOTE — Telephone Encounter (Signed)
Phone call to patient. She states she has been seen a few times for hypoglycemia in the last month. At her last office visit, was instructed by Dr. Tamala Julian to take 22 units of insulin nightly. Patient now believes she needs to change medication regimen, fasting blood sugars have been in the high 100's-low 200's. She states she is going to call back later today to relay blood sugar numbers.

## 2017-05-29 NOTE — Telephone Encounter (Signed)
Pt is calling back stating Rudene Christians told her to call back today if she hadn't heard from anyone. She said that she has questions about all her meds and Rudene Christians was going to talk to Dr. Tamala Julian. Please call back [phone].

## 2017-05-31 ENCOUNTER — Telehealth: Payer: Self-pay | Admitting: Family Medicine

## 2017-05-31 NOTE — Telephone Encounter (Unsigned)
Copied from [place] [phone]. Topic: Quick Communication - See Telephone Encounter >> May 31, 2017 12:55 PM Percell Belt A wrote: CRM for notification. See Telephone encounter for: 05/31/17. Pt called in and and would like a nurse to call her about her meds?  She needs to clarify her dosage?    Best number [phone]

## 2017-06-01 NOTE — Telephone Encounter (Signed)
Spoke to pt.  She states Her Glimepiride was stopped, Metformin was stopped and Insulin reduced by half.  She states she has called several times without response.   She is now eating regularly and sugars have been high. Example 8:20am today: 314 1:41pm today 303.  Pt states she wants to start back to what she was on prior to surgery - wants to start this regimen Sunday morning.   INsulin 55 units at bedtime Metformin 1000 mg 2x per day. She wants to take 1/2 Glimepiride until she sees how her sugars are - she states that Glimepiride drops her sugars so she wants to start out with 1/2  She will see Dr. Tamala Julian on 5/29

## 2017-06-04 NOTE — Telephone Encounter (Signed)
I agree with patient's plan to increase insulin to 55 units at bedtime and to increase Metformin to 1000mg  twice daily.  I also agree with taking 1/2 Glimeperide until she sees me on 06/12/17.  Continue to monitor sugars closely throughout the day.

## 2017-06-07 IMAGING — CT CT ABD-PELV W/ CM
2 of 5 series · 11 of 46 positions shown, 12 images · IV contrast (Iodine)
Comparison: None.

CLINICAL DATA: Seen at [HOSPITAL] for hip and back pain, pt. was
hypotensive and tachycardia, hx hysterectomy, back surgery, COPD,
bilateral lower abdominal pain.

EXAM:
CT ABDOMEN AND PELVIS WITH CONTRAST
TECHNIQUE: Multidetector CT imaging of the abdomen and pelvis was performed
using the standard protocol following bolus administration of
intravenous contrast.
CONTRAST:  100mL C1GWPZ-8YY IOPAMIDOL (C1GWPZ-8YY) INJECTION 61%

[Series 201: routine, idose (2) · axial · 0.73mm/px · z∈[-423,-48]mm · 8 of 95 slices shown, 9 images]
[im 10/95  soft-tissue]
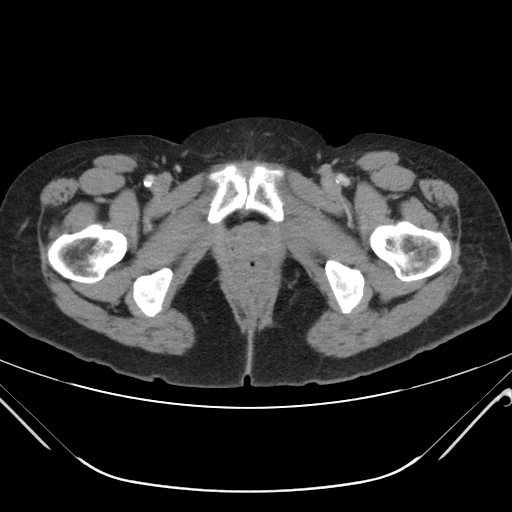
[im 10/95  bone]
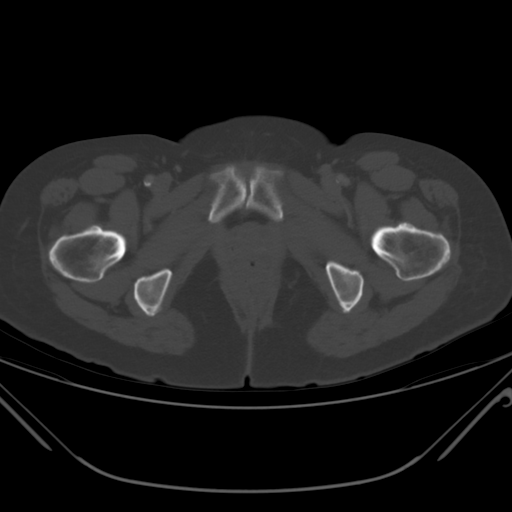
[im 20/95  soft-tissue]
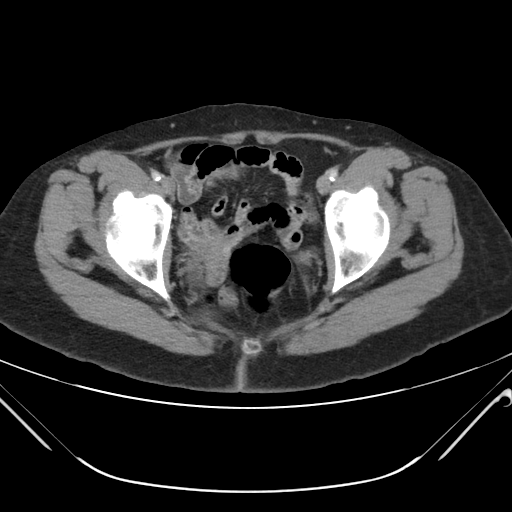
[im 30/95  soft-tissue]
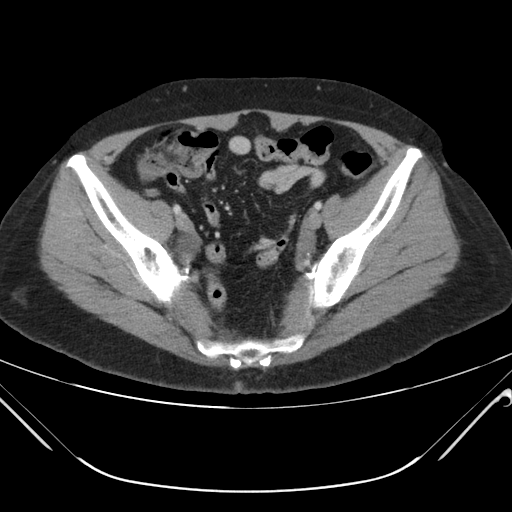
[im 40/95  soft-tissue]
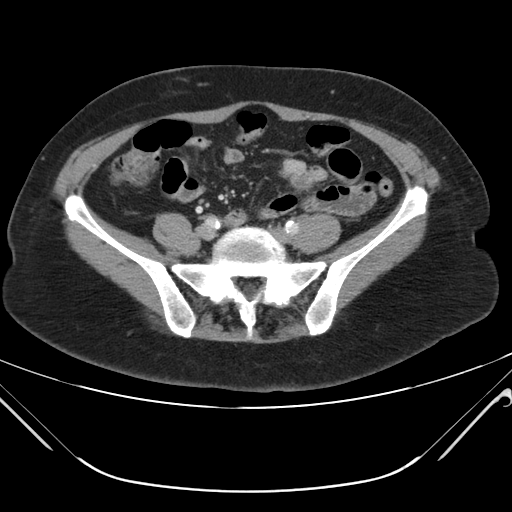
[im 55/95  soft-tissue]
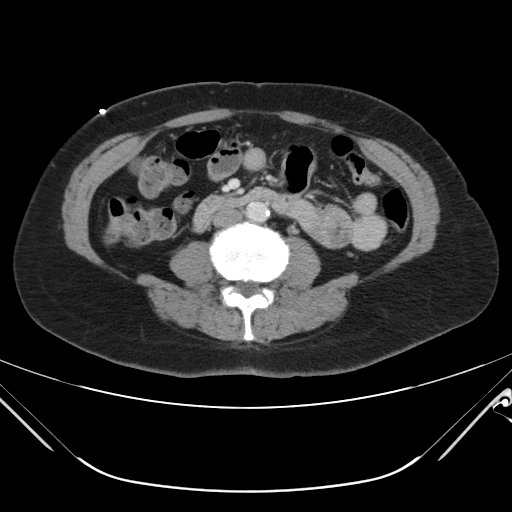
[im 65/95  soft-tissue]
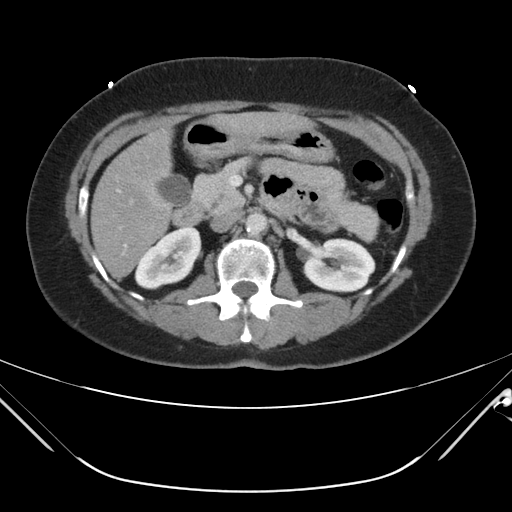
[im 75/95  soft-tissue]
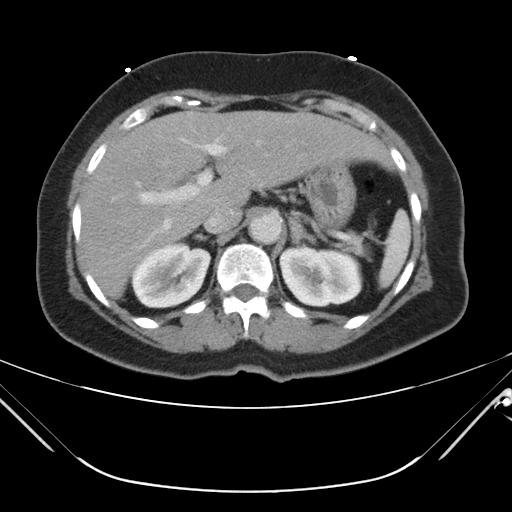
[im 85/95  soft-tissue]
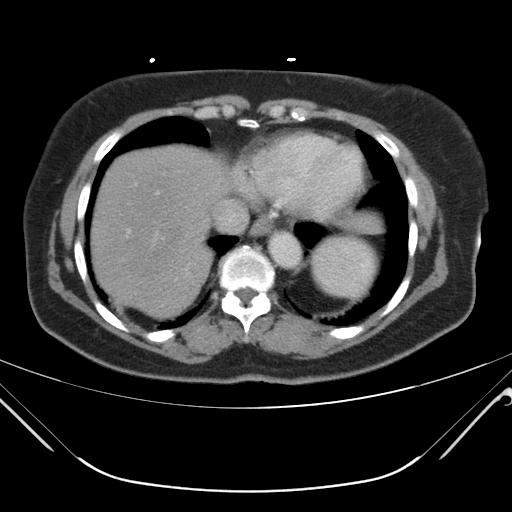

[Series 203: coronals, idose (2) · coronal · 0.45mm/px · 3 of 115 slices shown]
[im 39/115  soft-tissue]
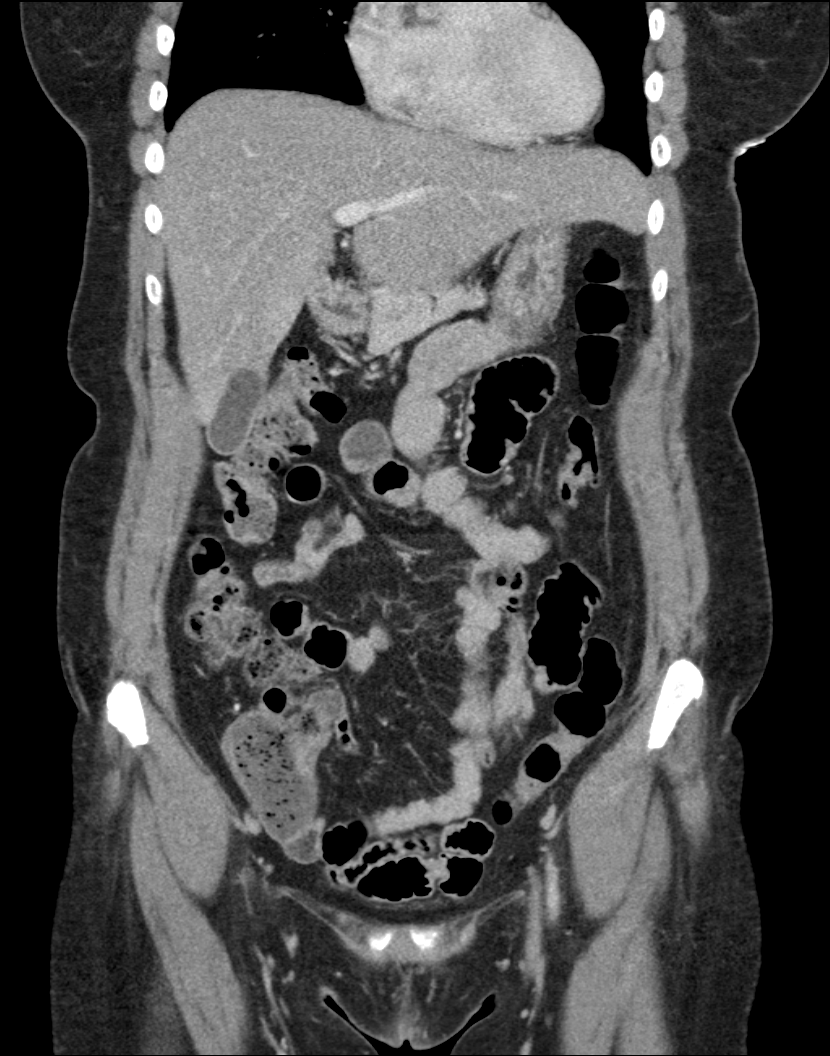
[im 51/115  soft-tissue]
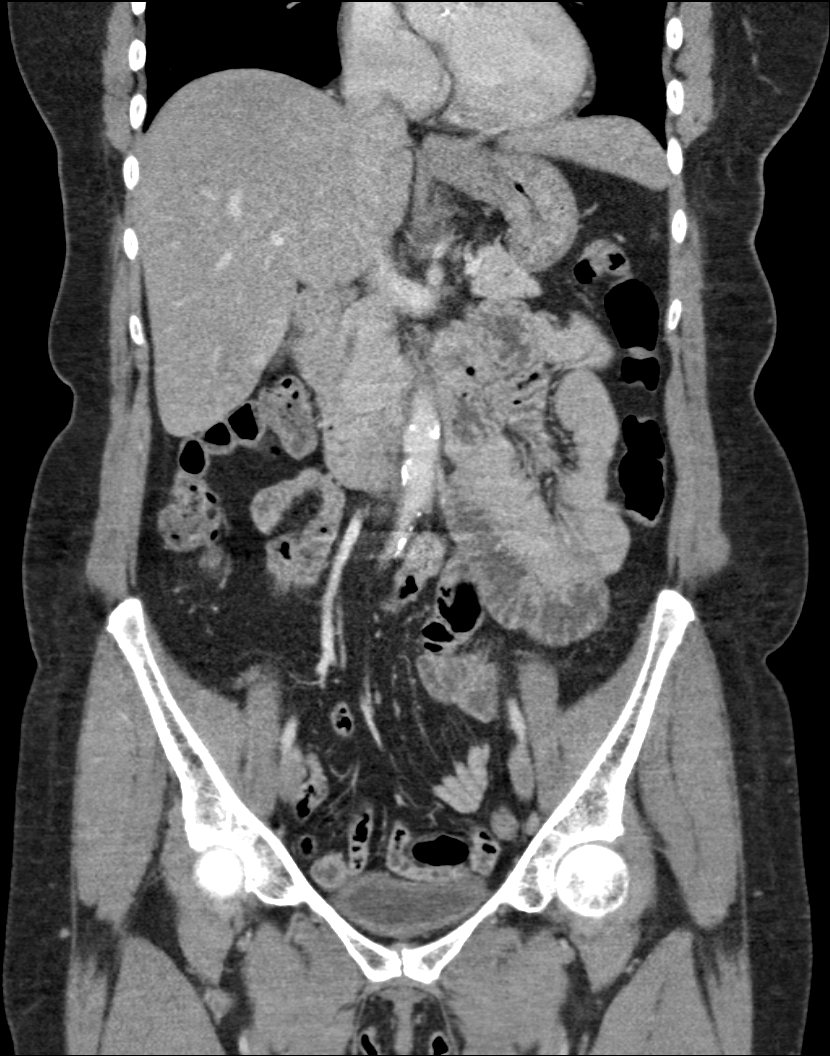
[im 64/115  soft-tissue]
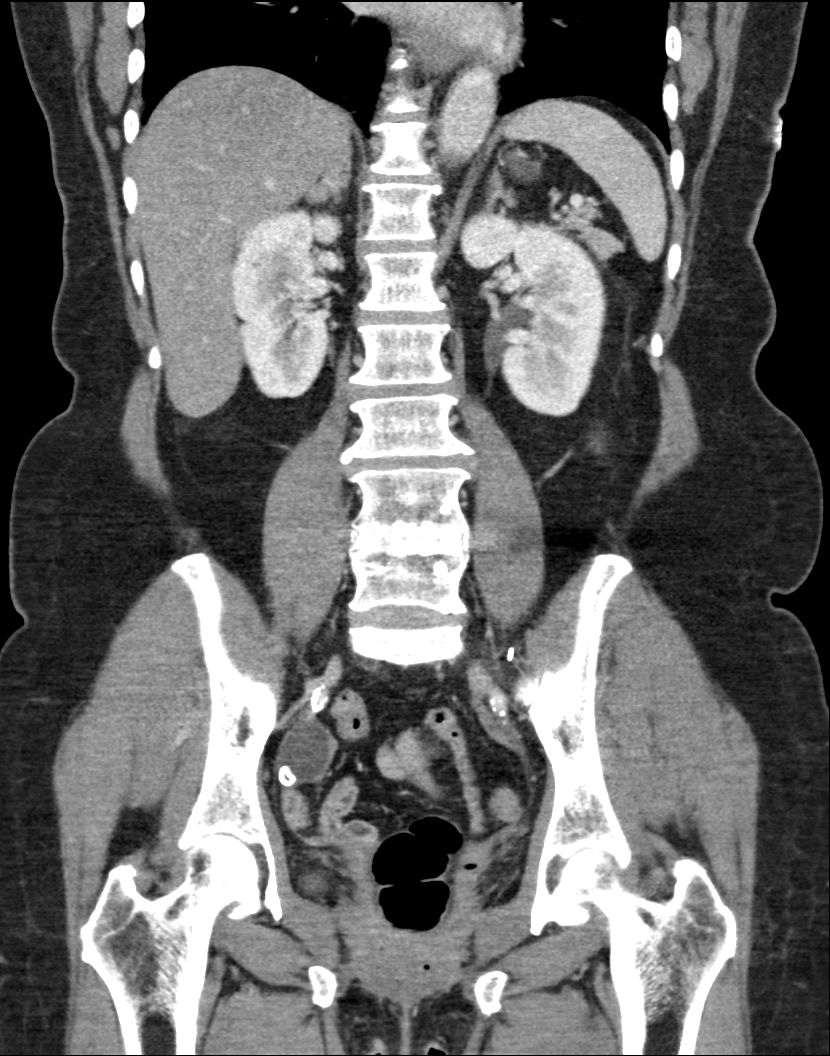

[11 of 46 positions shown; findings below may reference images not displayed]

FINDINGS: Dependent atelectasis in the lung bases. Calcification in the aortic
valve.

Mild diffuse fatty infiltration of the liver. Cholelithiasis without
inflammatory changes. No bile duct dilatation. The pancreas, spleen,
adrenal glands, kidneys, abdominal aorta, inferior vena cava, and
retroperitoneal lymph nodes are unremarkable.

Stomach, small bowel, and colon are not abnormally distended. No
free air or free fluid in the abdomen. Abdominal wall musculature
appears intact.

Pelvis: The appendix is normal. Uterus is surgically absent. Small
simple appearing cyst on the right ovary measuring 2.6 cm diameter.
Based on size, no follow-up is indicated. No bladder wall
thickening. No free or loculated pelvic fluid collections. No pelvic
lymphadenopathy. Postoperative changes with anterior fixation of
L4-L5. No destructive bone lesions.
IMPRESSION: Cholelithiasis. Diffuse fatty infiltration of the liver.
Benign-appearing cyst in the right ovary.

## 2017-06-12 ENCOUNTER — Encounter: Payer: Self-pay | Admitting: Family Medicine

## 2017-06-14 ENCOUNTER — Ambulatory Visit (INDEPENDENT_AMBULATORY_CARE_PROVIDER_SITE_OTHER): Payer: Medicare Other | Admitting: Family Medicine

## 2017-06-14 ENCOUNTER — Other Ambulatory Visit: Payer: Self-pay

## 2017-06-14 ENCOUNTER — Encounter: Payer: Self-pay | Admitting: Family Medicine

## 2017-06-14 VITALS — BP 126/72 | HR 107 | Temp 98.0°F | Resp 16 | Wt 162.0 lb

## 2017-06-14 DIAGNOSIS — N83201 Unspecified ovarian cyst, right side: Secondary | ICD-10-CM

## 2017-06-14 DIAGNOSIS — E7800 Pure hypercholesterolemia, unspecified: Secondary | ICD-10-CM | POA: Diagnosis not present

## 2017-06-14 DIAGNOSIS — E041 Nontoxic single thyroid nodule: Secondary | ICD-10-CM | POA: Diagnosis not present

## 2017-06-14 DIAGNOSIS — Z794 Long term (current) use of insulin: Secondary | ICD-10-CM | POA: Diagnosis not present

## 2017-06-14 DIAGNOSIS — I10 Essential (primary) hypertension: Secondary | ICD-10-CM | POA: Diagnosis not present

## 2017-06-14 DIAGNOSIS — E119 Type 2 diabetes mellitus without complications: Secondary | ICD-10-CM

## 2017-06-14 DIAGNOSIS — E162 Hypoglycemia, unspecified: Secondary | ICD-10-CM

## 2017-06-14 LAB — CBC WITH DIFFERENTIAL/PLATELET
BASOS ABS: 0 10*3/uL (ref 0.0–0.2)
Basos: 0 %
EOS (ABSOLUTE): 0 10*3/uL (ref 0.0–0.4)
Eos: 1 %
Hematocrit: 42.3 % (ref 34.0–46.6)
Hemoglobin: 14.1 g/dL (ref 11.1–15.9)
IMMATURE GRANS (ABS): 0 10*3/uL (ref 0.0–0.1)
IMMATURE GRANULOCYTES: 0 %
LYMPHS: 43 %
Lymphocytes Absolute: 3.3 10*3/uL — ABNORMAL HIGH (ref 0.7–3.1)
MCH: 28.2 pg (ref 26.6–33.0)
MCHC: 33.3 g/dL (ref 31.5–35.7)
MCV: 85 fL (ref 79–97)
Monocytes Absolute: 0.5 10*3/uL (ref 0.1–0.9)
Monocytes: 6 %
NEUTROS PCT: 50 %
Neutrophils Absolute: 3.8 10*3/uL (ref 1.4–7.0)
PLATELETS: 271 10*3/uL (ref 150–450)
RBC: 5 x10E6/uL (ref 3.77–5.28)
RDW: 16.2 % — ABNORMAL HIGH (ref 12.3–15.4)
WBC: 7.7 10*3/uL (ref 3.4–10.8)

## 2017-06-14 LAB — COMPREHENSIVE METABOLIC PANEL
ALT: 12 IU/L (ref 0–32)
AST: 15 IU/L (ref 0–40)
Albumin/Globulin Ratio: 1.7 (ref 1.2–2.2)
Albumin: 4 g/dL (ref 3.5–4.8)
Alkaline Phosphatase: 126 IU/L — ABNORMAL HIGH (ref 39–117)
BILIRUBIN TOTAL: 0.3 mg/dL (ref 0.0–1.2)
BUN/Creatinine Ratio: 17 (ref 12–28)
BUN: 15 mg/dL (ref 8–27)
CALCIUM: 9.3 mg/dL (ref 8.7–10.3)
CHLORIDE: 103 mmol/L (ref 96–106)
CO2: 20 mmol/L (ref 20–29)
Creatinine, Ser: 0.86 mg/dL (ref 0.57–1.00)
GFR calc non Af Amer: 68 mL/min/{1.73_m2} (ref >59)
GFR, EST AFRICAN AMERICAN: 78 mL/min/{1.73_m2} (ref >59)
GLUCOSE: 101 mg/dL — AB (ref 65–99)
Globulin, Total: 2.4 g/dL (ref 1.5–4.5)
Potassium: 4.6 mmol/L (ref 3.5–5.2)
Sodium: 142 mmol/L (ref 134–144)
TOTAL PROTEIN: 6.4 g/dL (ref 6.0–8.5)

## 2017-06-14 LAB — POCT GLYCOSYLATED HEMOGLOBIN (HGB A1C): HEMOGLOBIN A1C: 8.2 % — AB (ref 4.0–5.6)

## 2017-06-14 LAB — GLUCOSE, POCT (MANUAL RESULT ENTRY): POC Glucose: 102 mg/dl — AB (ref 70–99)

## 2017-06-14 MED ORDER — PRAVASTATIN SODIUM 40 MG PO TABS: ORAL_TABLET | ORAL | 3 refills | Status: DC

## 2017-06-14 MED ORDER — TIOTROPIUM BROMIDE MONOHYDRATE 18 MCG IN CAPS: 18.0000 ug | ORAL_CAPSULE | Freq: Every day | RESPIRATORY_TRACT | 3 refills | Status: DC

## 2017-06-14 MED ORDER — INSULIN GLARGINE 100 UNIT/ML SOLOSTAR PEN: 55.0000 [IU] | PEN_INJECTOR | Freq: Every day | SUBCUTANEOUS | 5 refills | Status: DC

## 2017-06-14 MED ORDER — NEEDLES & SYRINGES MISC
3 refills | Status: DC
Start: 2017-06-14 — End: 2017-06-25

## 2017-06-14 MED ORDER — LISINOPRIL 10 MG PO TABS: 10.0000 mg | ORAL_TABLET | Freq: Every day | ORAL | 3 refills | Status: DC

## 2017-06-14 MED ORDER — FLUTICASONE FUROATE-VILANTEROL 100-25 MCG/INH IN AEPB: 1.0000 | INHALATION_SPRAY | Freq: Every day | RESPIRATORY_TRACT | 3 refills | Status: DC

## 2017-06-14 MED ORDER — METOPROLOL SUCCINATE ER 25 MG PO TB24: 25.0000 mg | ORAL_TABLET | Freq: Every day | ORAL | 3 refills | Status: DC

## 2017-06-14 MED ORDER — METFORMIN HCL 1000 MG PO TABS: 1000.0000 mg | ORAL_TABLET | Freq: Two times a day (BID) | ORAL | 3 refills | Status: DC

## 2017-06-14 NOTE — Progress Notes (Signed)
Subjective:    Patient ID: [name], female    DOB: [birth year], 72 y.o.   MRN: [id number]  06/14/2017  Hypoglycemia (1 month follow-up )    HPI This 72 y.o. female presents for follow-up evaluation of hypoglycemia/DMII.  Yesterday sugars 90/120/124. Tuesday 70/84/67. Friday 65.    On 06/02/17, aspirin, Lisinopril, Metformin, Metoprolol, vitamin D3, 1/2 Glimeperide. Dinner: Metformin, Pravastatin, 1/2 Glimeperide, 55 units Lantus at 10pm. Low sugars this week; air is out.  Not snacking as much.  Miserable.    Thyroid nodules: due for repeat thyroid ultrasound for one year follow-up.  RIGHT Ovarian cyst: detected on CT abdomen and pelvic on 04/18/17 during admission for cholelithiasis.   Increased size of right ovarian cyst measuring 3.2cm.  Pelvic ultrasound recommended.  BP Readings from Last 3 Encounters:  07/24/17 (!) 142/72  06/14/17 126/72  05/13/17 122/72   Wt Readings from Last 3 Encounters:  07/24/17 161 lb 12.8 oz (73.4 kg)  06/14/17 162 lb (73.5 kg)  05/13/17 164 lb (74.4 kg)   Immunization History  Administered Date(s) Administered  . Influenza Split 09/28/2011, 09/15/2012, 09/23/2013  . Influenza,inj,Quad PF,6+ Mos 09/21/2014, 09/11/2016  . Influenza-Unspecified 09/29/2015  . Pneumococcal Conjugate-13 02/07/2015  . Pneumococcal Polysaccharide-23 12/20/2011  . Tdap 09/21/2014  . Zoster 04/17/2012    Review of Systems  Constitutional: Positive for appetite change, diaphoresis and fatigue. Negative for activity change, chills, fever and unexpected weight change.  HENT: Negative for congestion, dental problem, drooling, ear discharge, ear pain, facial swelling, hearing loss, mouth sores, nosebleeds, postnasal drip, rhinorrhea, sinus pressure, sneezing, sore throat, tinnitus, trouble swallowing and voice change.   Eyes: Negative for photophobia, pain, discharge, redness, itching and visual disturbance.  Respiratory: Negative for apnea, cough, choking,  chest tightness, shortness of breath, wheezing and stridor.   Cardiovascular: Negative for chest pain, palpitations and leg swelling.  Gastrointestinal: Negative for abdominal distention, abdominal pain, anal bleeding, blood in stool, constipation, diarrhea, nausea, rectal pain and vomiting.  Endocrine: Negative for cold intolerance, heat intolerance, polydipsia, polyphagia and polyuria.  Genitourinary: Negative for decreased urine volume, difficulty urinating, dyspareunia, dysuria, enuresis, flank pain, frequency, genital sores, hematuria, menstrual problem, pelvic pain, urgency, vaginal bleeding, vaginal discharge and vaginal pain.  Musculoskeletal: Negative for arthralgias, back pain, gait problem, joint swelling, myalgias, neck pain and neck stiffness.  Skin: Negative for color change, pallor, rash and wound.  Allergic/Immunologic: Negative for environmental allergies, food allergies and immunocompromised state.  Neurological: Negative for dizziness, tremors, seizures, syncope, facial asymmetry, speech difficulty, weakness, light-headedness, numbness and headaches.  Hematological: Negative for adenopathy. Does not bruise/bleed easily.  Psychiatric/Behavioral: Negative for agitation, behavioral problems, confusion, decreased concentration, dysphoric mood, hallucinations, self-injury, sleep disturbance and suicidal ideas. The patient is not nervous/anxious and is not hyperactive.     Past Medical History:  Diagnosis Date  . COPD (chronic obstructive pulmonary disease) ([place])   . Diabetes mellitus   . Hyperlipidemia   . Hypertension   . Night sweats   . Rectal polyp    Past Surgical History:  Procedure Laterality Date  . ABDOMINAL HYSTERECTOMY  2000   fibroids; ovaries intact.  Marland Kitchen BACK SURGERY     plate put in back  . CHOLECYSTECTOMY N/A 05/01/2017   Procedure: LAPAROSCOPIC CHOLECYSTECTOMY WITH INTRAOPERATIVE CHOLANGIOGRAM;  Surgeon: Donnie Mesa, MD;  Location: [facility];  Service: General;   Laterality: N/A;  . 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 TUBAL LIGATION     No Known Allergies Current Outpatient  Medications on File Prior to Visit  Medication Sig Dispense Refill  . Albuterol Sulfate (PROAIR RESPICLICK) 563 (90 Base) MCG/ACT AEPB Inhale 2 Act into the lungs 4 (four) times daily as needed. 3 each 1  . aspirin 81 MG tablet Take 81 mg by mouth daily.      . blood glucose meter kit and supplies KIT Dispense based on patient and insurance preference. Use up to four times daily as directed. (FOR ICD-9 250.00, 250.01). 1 each 0  . Cholecalciferol (VITAMIN D3) 1000 UNITS CAPS Take 2 capsules by mouth every morning.     Marland Kitchen glimepiride (AMARYL) 4 MG tablet Take 1 tablet (4 mg total) by mouth 2 (two) times daily with a meal. 180 tablet 3  . hydrocortisone 2.5 % ointment Apply topically 2 (two) times daily. 30 g 0  . nystatin cream (MYCOSTATIN) Apply 1 application topically 3 (three) times daily. 30 g 1  . ondansetron (ZOFRAN) 4 MG tablet Take 1 tablet (4 mg total) by mouth every 6 (six) hours as needed for nausea. 12 tablet 0  . ondansetron (ZOFRAN-ODT) 8 MG disintegrating tablet Take 1 tablet (8 mg total) by mouth every 8 (eight) hours as needed for nausea or vomiting. 20 tablet 0  . oxyCODONE (OXY IR/ROXICODONE) 5 MG immediate release tablet Take 1 tablet (5 mg total) by mouth every 6 (six) hours as needed for severe pain. 20 tablet 0  . oxyCODONE-acetaminophen (PERCOCET) 5-325 MG tablet Take 1 tablet by mouth every 4 (four) hours as needed for moderate pain. 15 tablet 0   No current facility-administered medications on file prior to visit.    Social History   Socioeconomic History  . Marital status: Divorced    Spouse name: Not on file  . Number of children: 3  . Years of education: Not on file  . Highest education level: Not on file  Occupational History  . Occupation: retired    Comment: [employer]  Social Needs  . Financial resource strain: Not on  file  . Food insecurity:    Worry: Not on file    Inability: Not on file  . Transportation needs:    Medical: Not on file    Non-medical: Not on file  Tobacco Use  . Smoking status: Current Every Day Smoker    Packs/day: 0.50    Years: 50.00    Pack years: 25.00    Types: Cigarettes  . Smokeless tobacco: Never Used  Substance and Sexual Activity  . Alcohol use: No  . Drug use: No  . Sexual activity: Not on file  Lifestyle  . Physical activity:    Days per week: Not on file    Minutes per session: Not on file  . Stress: Not on file  Relationships  . Social connections:    Talks on phone: Not on file    Gets together: Not on file    Attends religious service: Not on file    Active member of club or organization: Not on file    Attends meetings of clubs or organizations: Not on file    Relationship status: Not on file  . Intimate partner violence:    Fear of current or ex partner: Not on file    Emotionally abused: Not on file    Physically abused: Not on file    Forced sexual activity: Not on file  Other Topics Concern  . Not on file  Social History Narrative   Marital status: divorced; not dating  in 2019 but interested slightly.        Children:  3 children; 7 seven grandchildren; 1 gg      Lives: with oldest daughter, 2 grandchildren, 66 gg, 1 friend of daughters, daughter's boyfriend.       Employment: retired [employer] age 9.      Tobacco; 1ppd x 50 years.  Not interested in 2019.      Alcohol: none      Exercise: none in 2019. [place]      ADLs:   independent with ADLs; no assistant devices      Advanced Directives:  None; DNR/DNI; no HCPOA.    Family History  Problem Relation Age of Onset  . Diabetes Sister   . Diabetes Brother   . Hypertension Brother   . Cancer Mother 59       colon(age 59) and lung (age 51)  . Diabetes Mother   . Heart disease Mother 22       CABG  . Hyperlipidemia Mother   . Cancer Father 63       stomach    . Diabetes Maternal Grandmother   . Mental illness Sister        Objective:    BP 126/72   Pulse (!) 107   Temp 98 F (36.7 C) (Oral)   Resp 16   Wt 162 lb (73.5 kg)   SpO2 98%   BMI 25.37 kg/m  Physical Exam  Constitutional: She is oriented to person, place, and time. She appears well-developed and well-nourished. No distress.  HENT:  Head: Normocephalic and atraumatic.  Right Ear: External ear normal.  Left Ear: External ear normal.  Nose: Nose normal.  Mouth/Throat: Oropharynx is clear and moist.  Eyes: Pupils are equal, round, and reactive to light. Conjunctivae and EOM are normal.  Neck: Normal range of motion and full passive range of motion without pain. Neck supple. No JVD present. Carotid bruit is not present. No thyromegaly present.  Cardiovascular: Normal rate, regular rhythm and normal heart sounds. Exam reveals no gallop and no friction rub.  No murmur heard. Pulmonary/Chest: Effort normal and breath sounds normal. She has no wheezes. She has no rales.  Abdominal: Soft. Bowel sounds are normal. She exhibits no distension and no mass. There is no tenderness. There is no rebound and no guarding.  Musculoskeletal:       Right shoulder: Normal.       Left shoulder: Normal.       Cervical back: Normal.  Lymphadenopathy:    She has no cervical adenopathy.  Neurological: She is alert and oriented to person, place, and time. She has normal reflexes. No cranial nerve deficit. She exhibits normal muscle tone. Coordination normal.  Skin: Skin is warm and dry. No rash noted. She is not diaphoretic. No erythema. No pallor.  Psychiatric: She has a normal mood and affect. Her behavior is normal. Judgment and thought content normal.  Nursing note and vitals reviewed.  No results found. Depression screen [facility] 2/9 07/24/2017 06/14/2017 05/13/2017 05/09/2017 04/17/2017  Decreased Interest 0 0 0 0 0  Down, Depressed, Hopeless 0 0 0 0 0  PHQ - 2 Score 0 0 0 0 0   Fall Risk   07/24/2017 06/14/2017 05/13/2017 05/09/2017 04/17/2017  Falls in the past year? _0   Comment - - - - -  Number falls in past yr: - - - - -  Injury with Fall? - - - - -  Assessment & Plan:   1. Hypoglycemia   2. Thyroid nodule   3. Right ovarian cyst   4. Type 2 diabetes mellitus without complication, with long-term current use of insulin ([place])   5. Essential hypertension   6. Pure hypercholesterolemia    Hypoglycemia frequent: New onset; due to decreased appetite with heat and lack of air conditioning in home; advised to HOLD Glimeperide while appetite is down.  Continue to monitor sugars closely each day.  R ovarian cyst: New. Asymptomatic; refer for pelvic US.  Thyroid nodule: due for one year follow-up thyroid ultrasound.  Hypertension, hypercholesterolemia, DMII: stable; obtain labs; refills provided.   Orders Placed This Encounter  Procedures  . US THYROID    Ins-mcr Epic order Wt-162/ Cosign requested 06/27/17 am[facility]    Standing Status:   Future    Number of Occurrences:   1    Standing Expiration Date:   08/15/2018    Order Specific Question:   Reason for Exam (SYMPTOM  OR DIAGNOSIS REQUIRED)    Answer:   follow-up thyroid nodules; one year follow-up    Order Specific Question:   Preferred imaging location?    Answer:   [facility]  . US PELVIC COMPLETE WITH TRANSVAGINAL    Ins-mcr Epic order/ Wt-162/no needs Am[facility],pt Cosign requested 06/27/17 am[facility]    Standing Status:   Future    Number of Occurrences:   1    Standing Expiration Date:   08/15/2018    Order Specific Question:   Reason for Exam (SYMPTOM  OR DIAGNOSIS REQUIRED)    Answer:   R ovarian cyst on CT abdomen/pelvis in 04/2017; 3.2cm in size on CT abdomen/pelvis    Order Specific Question:   Preferred imaging location?    Answer:   [facility]  . CBC with Differential/Platelet  . Comprehensive metabolic panel  . POCT glucose (manual entry)  . POCT glycosylated hemoglobin  (Hb A1C)   Meds ordered this encounter  Medications  . DISCONTD: fluticasone furoate-vilanterol (BREO ELLIPTA) 100-25 MCG/INH AEPB    Sig: Inhale 1 puff into the lungs daily.    Dispense:  90 each    Refill:  3  . DISCONTD: Insulin Glargine (LANTUS SOLOSTAR) 100 UNIT/ML Solostar Pen    Sig: Inject 55 Units into the skin daily at 10 pm.    Dispense:  30 pen    Refill:  5    DISPENSE: 3 MONTH SUPPLY  . DISCONTD: lisinopril (PRINIVIL,ZESTRIL) 10 MG tablet    Sig: Take 1 tablet (10 mg total) by mouth daily.    Dispense:  90 tablet    Refill:  3  . DISCONTD: metFORMIN (GLUCOPHAGE) 1000 MG tablet    Sig: Take 1 tablet (1,000 mg total) by mouth 2 (two) times daily with a meal.    Dispense:  180 tablet    Refill:  3  . DISCONTD: metoprolol succinate (TOPROL-XL) 25 MG 24 hr tablet    Sig: Take 1 tablet (25 mg total) by mouth daily.    Dispense:  90 tablet    Refill:  3  . DISCONTD: Needles & Syringes MISC    Sig: Insulin needles and syringes    Dispense:  100 each    Refill:  3  . DISCONTD: pravastatin (PRAVACHOL) 40 MG tablet    Sig: Take 1 tablet by mouth  every evening after a meal    Dispense:  90 tablet    Refill:  3  . DISCONTD: tiotropium (SPIRIVA) 18  MCG inhalation capsule    Sig: Place 1 capsule (18 mcg total) into inhaler and inhale daily.    Dispense:  90 capsule    Refill:  3    Return in about 4 months (around 10/14/2017) for follow-up chronic medical conditions STALLINGS.   Babe Anthis Elayne Guerin, M.D. Primary Care at [facility] [address] [place],   [phone] phone [phone] fax'

## 2017-06-14 NOTE — Patient Instructions (Addendum)
HOLD GLIMEPERIDE WHILE AIR IS OUT AND NOT EATING AS MUCH.   IF you received an x-ray today, you will receive an invoice from [facility]. Please contact [facility] at [phone] with questions or concerns regarding your invoice.   IF you received labwork today, you will receive an invoice from [place]. Please contact [facility] at [phone] with questions or concerns regarding your invoice.   Our billing staff will not be able to assist you with questions regarding bills from these companies.  You will be contacted with the lab results as soon as they are available. The fastest way to get your results is to activate your My Chart account. Instructions are located on the last page of this paperwork. If you have not heard from [place] regarding the results in 2 weeks, please contact this office.      Diabetes Mellitus and Nutrition When you have diabetes (diabetes mellitus), it is very important to have healthy eating habits because your blood sugar (glucose) levels are greatly affected by what you eat and drink. Eating healthy foods in the appropriate amounts, at about the same times every day, can help you:  Control your blood glucose.  Lower your risk of heart disease.  Improve your blood pressure.  Reach or maintain a healthy weight.  Every person with diabetes is different, and each person has different needs for a meal plan. Your health care provider may recommend that you work with a diet and nutrition specialist (dietitian) to make a meal plan that is best for you. Your meal plan may vary depending on factors such as:  The calories you need.  The medicines you take.  Your weight.  Your blood glucose, blood pressure, and cholesterol levels.  Your activity level.  Other health conditions you have, such as heart or kidney disease.  How do carbohydrates affect me? Carbohydrates affect your blood glucose level more than any other type of food. Eating  carbohydrates naturally increases the amount of glucose in your blood. Carbohydrate counting is a method for keeping track of how many carbohydrates you eat. Counting carbohydrates is important to keep your blood glucose at a healthy level, especially if you use insulin or take certain oral diabetes medicines. It is important to know how many carbohydrates you can safely have in each meal. This is different for every person. Your dietitian can help you calculate how many carbohydrates you should have at each meal and for snack. Foods that contain carbohydrates include:  Bread, cereal, rice, pasta, and crackers.  Potatoes and corn.  Peas, beans, and lentils.  Milk and yogurt.  Fruit and juice.  Desserts, such as cakes, cookies, ice cream, and candy.  How does alcohol affect me? Alcohol can cause a sudden decrease in blood glucose (hypoglycemia), especially if you use insulin or take certain oral diabetes medicines. Hypoglycemia can be a life-threatening condition. Symptoms of hypoglycemia (sleepiness, dizziness, and confusion) are similar to symptoms of having too much alcohol. If your health care provider says that alcohol is safe for you, follow these guidelines:  Limit alcohol intake to no more than 1 drink per day for nonpregnant women and 2 drinks per day for men. One drink equals 12 oz of beer, 5 oz of wine, or 1 oz of hard liquor.  Do not drink on an empty stomach.  Keep yourself hydrated with water, diet soda, or unsweetened iced tea.  Keep in mind that regular soda, juice, and other mixers may contain a lot of sugar and  must be counted as carbohydrates.  What are tips for following this plan? Reading food labels  Start by checking the serving size on the label. The amount of calories, carbohydrates, fats, and other nutrients listed on the label are based on one serving of the food. Many foods contain more than one serving per package.  Check the total grams (g) of  carbohydrates in one serving. You can calculate the number of servings of carbohydrates in one serving by dividing the total carbohydrates by 15. For example, if a food has 30 g of total carbohydrates, it would be equal to 2 servings of carbohydrates.  Check the number of grams (g) of saturated and trans fats in one serving. Choose foods that have low or no amount of these fats.  Check the number of milligrams (mg) of sodium in one serving. Most people should limit total sodium intake to less than 2,300 mg per day.  Always check the nutrition information of foods labeled as "low-fat" or "nonfat". These foods may be higher in added sugar or refined carbohydrates and should be avoided.  Talk to your dietitian to identify your daily goals for nutrients listed on the label. Shopping  Avoid buying canned, premade, or processed foods. These foods tend to be high in fat, sodium, and added sugar.  Shop around the outside edge of the grocery store. This includes fresh fruits and vegetables, bulk grains, fresh meats, and fresh dairy. Cooking  Use low-heat cooking methods, such as baking, instead of high-heat cooking methods like deep frying.  Cook using healthy oils, such as olive, canola, or sunflower oil.  Avoid cooking with butter, cream, or high-fat meats. Meal planning  Eat meals and snacks regularly, preferably at the same times every day. Avoid going long periods of time without eating.  Eat foods high in fiber, such as fresh fruits, vegetables, beans, and whole grains. Talk to your dietitian about how many servings of carbohydrates you can eat at each meal.  Eat 4-6 ounces of lean protein each day, such as lean meat, chicken, fish, eggs, or tofu. 1 ounce is equal to 1 ounce of meat, chicken, or fish, 1 egg, or 1/4 cup of tofu.  Eat some foods each day that contain healthy fats, such as avocado, nuts, seeds, and fish. Lifestyle   Check your blood glucose regularly.  Exercise at least  30 minutes 5 or more days each week, or as told by your health care provider.  Take medicines as told by your health care provider.  Do not use any products that contain nicotine or tobacco, such as cigarettes and e-cigarettes. If you need help quitting, ask your health care provider.  Work with a Social worker or diabetes educator to identify strategies to manage stress and any emotional and social challenges. What are some questions to ask my health care provider?  Do I need to meet with a diabetes educator?  Do I need to meet with a dietitian?  What number can I call if I have questions?  When are the best times to check my blood glucose? Where to find more information:  American Diabetes Association: diabetes.org/food-and-fitness/food  Academy of Nutrition and Dietetics: PokerClues.dk  [employer] of Diabetes and Digestive and Kidney Diseases (NIH): ContactWire.be Summary  A healthy meal plan will help you control your blood glucose and maintain a healthy lifestyle.  Working with a diet and nutrition specialist (dietitian) can help you make a meal plan that is best for you.  Keep in mind  that carbohydrates and alcohol have immediate effects on your blood glucose levels. It is important to count carbohydrates and to use alcohol carefully. This information is not intended to replace advice given to you by your health care provider. Make sure you discuss any questions you have with your health care provider. Document Released: 09/28/2004 Document Revised: 02/06/2016 Document Reviewed: 02/06/2016 Elsevier Interactive Patient Education  [employer].

## 2017-06-22 ENCOUNTER — Telehealth: Payer: Self-pay | Admitting: Family Medicine

## 2017-06-22 NOTE — Telephone Encounter (Signed)
Pt. Calling to inform Dr. Ranae Pila that the prescriptions sent on 06/14/17 were all sent to the wrong pharmacy. Pt. Uses [facility] mail order service. Please resend to preferred pharmacy.

## 2017-06-24 ENCOUNTER — Other Ambulatory Visit: Payer: Self-pay | Admitting: Family Medicine

## 2017-06-25 MED ORDER — METOPROLOL SUCCINATE ER 25 MG PO TB24: 25.0000 mg | ORAL_TABLET | Freq: Every day | ORAL | 3 refills | Status: DC

## 2017-06-25 MED ORDER — METFORMIN HCL 1000 MG PO TABS: 1000.0000 mg | ORAL_TABLET | Freq: Two times a day (BID) | ORAL | 3 refills | Status: DC

## 2017-06-25 MED ORDER — INSULIN GLARGINE 100 UNIT/ML SOLOSTAR PEN: 55.0000 [IU] | PEN_INJECTOR | Freq: Every day | SUBCUTANEOUS | 5 refills | Status: DC

## 2017-06-25 MED ORDER — NEEDLES & SYRINGES MISC: 3 refills | Status: DC

## 2017-06-25 MED ORDER — TIOTROPIUM BROMIDE MONOHYDRATE 18 MCG IN CAPS: 18.0000 ug | ORAL_CAPSULE | Freq: Every day | RESPIRATORY_TRACT | 3 refills | Status: DC

## 2017-06-25 MED ORDER — FLUTICASONE FUROATE-VILANTEROL 100-25 MCG/INH IN AEPB: 1.0000 | INHALATION_SPRAY | Freq: Every day | RESPIRATORY_TRACT | 3 refills | Status: DC

## 2017-06-25 MED ORDER — LISINOPRIL 10 MG PO TABS: 10.0000 mg | ORAL_TABLET | Freq: Every day | ORAL | 3 refills | Status: DC

## 2017-06-25 MED ORDER — PRAVASTATIN SODIUM 40 MG PO TABS: ORAL_TABLET | ORAL | 3 refills | Status: DC

## 2017-06-25 NOTE — Telephone Encounter (Signed)
Medications re-ordered to be sent to [facility]:   Requested Prescriptions   Pending Prescriptions Disp Refills  . pravastatin (PRAVACHOL) 40 MG tablet 90 tablet 3    Sig: Take 1 tablet by mouth  every evening after a meal  . tiotropium (SPIRIVA) 18 MCG inhalation capsule 90 capsule 3    Sig: Place 1 capsule (18 mcg total) into inhaler and inhale daily.  . Needles & Syringes MISC 100 each 3    Sig: Insulin needles and syringes  . metoprolol succinate (TOPROL-XL) 25 MG 24 hr tablet 90 tablet 3    Sig: Take 1 tablet (25 mg total) by mouth daily.  . metFORMIN (GLUCOPHAGE) 1000 MG tablet 180 tablet 3    Sig: Take 1 tablet (1,000 mg total) by mouth 2 (two) times daily with a meal.  . lisinopril (PRINIVIL,ZESTRIL) 10 MG tablet 90 tablet 3    Sig: Take 1 tablet (10 mg total) by mouth daily.  . Insulin Glargine (LANTUS SOLOSTAR) 100 UNIT/ML Solostar Pen 30 pen 5    Sig: Inject 55 Units into the skin daily at 10 pm.  . fluticasone furoate-vilanterol (BREO ELLIPTA) 100-25 MCG/INH AEPB 90 each 3    Sig: Inhale 1 puff into the lungs daily.   Medicines sent to [facility]. Cancelled at [facility] on [facility].   Phone call to patient, patient notified.

## 2017-07-08 ENCOUNTER — Ambulatory Visit
Admission: RE | Admit: 2017-07-08 | Discharge: 2017-07-08 | Disposition: A | Discharge status: Home / Self Care | Payer: Medicare Other | Source: Ambulatory Visit | Attending: Family Medicine | Admitting: Family Medicine

## 2017-07-08 DIAGNOSIS — N83201 Unspecified ovarian cyst, right side: Secondary | ICD-10-CM

## 2017-07-08 DIAGNOSIS — E042 Nontoxic multinodular goiter: Secondary | ICD-10-CM | POA: Diagnosis not present

## 2017-07-08 DIAGNOSIS — E041 Nontoxic single thyroid nodule: Secondary | ICD-10-CM

## 2017-07-09 ENCOUNTER — Other Ambulatory Visit: Payer: Self-pay | Admitting: Family Medicine

## 2017-07-09 DIAGNOSIS — E042 Nontoxic multinodular goiter: Secondary | ICD-10-CM

## 2017-07-09 NOTE — Progress Notes (Deleted)
Subjective:    Patient ID: [name], female    DOB: 09/17/1945, 72 y.o.   MRN: [id number]  07/09/2017  No chief complaint on file.    HPI This 72 y.o. female presents for evaluation of ***. BP Readings from Last 3 Encounters:  06/14/17 126/72  05/13/17 122/72  05/09/17 110/64   Wt Readings from Last 3 Encounters:  06/14/17 162 lb (73.5 kg)  05/13/17 164 lb (74.4 kg)  05/09/17 164 lb 6.4 oz (74.6 kg)   Immunization History  Administered Date(s) Administered  . Influenza Split 09/28/2011, 09/15/2012, 09/23/2013  . Influenza,inj,Quad PF,6+ Mos 09/21/2014, 09/11/2016  . Influenza-Unspecified 09/29/2015  . Pneumococcal Conjugate-13 02/07/2015  . Pneumococcal Polysaccharide-23 12/20/2011  . Tdap 09/21/2014  . Zoster 04/17/2012    Review of Systems  Past Medical History:  Diagnosis Date  . COPD (chronic obstructive pulmonary disease) ([place])   . Diabetes mellitus   . Hyperlipidemia   . Hypertension   . Night sweats   . Rectal polyp    Past Surgical History:  Procedure Laterality Date  . ABDOMINAL HYSTERECTOMY  2000   fibroids; ovaries intact.  Marland Kitchen BACK SURGERY     plate put in back  . CHOLECYSTECTOMY N/A 05/01/2017   Procedure: LAPAROSCOPIC CHOLECYSTECTOMY WITH INTRAOPERATIVE CHOLANGIOGRAM;  Surgeon: Donnie Mesa, MD;  Location: [place];  Service: General;  Laterality: N/A;  . 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 TUBAL LIGATION     No Known Allergies Current Outpatient Medications on File Prior to Visit  Medication Sig Dispense Refill  . Albuterol Sulfate (PROAIR RESPICLICK) 353 (90 Base) MCG/ACT AEPB Inhale 2 Act into the lungs 4 (four) times daily as needed. 3 each 1  . aspirin 81 MG tablet Take 81 mg by mouth daily.      . blood glucose meter kit and supplies KIT Dispense based on patient and insurance preference. Use up to four times daily as directed. (FOR ICD-9 250.00, 250.01). 1 each 0  . Cholecalciferol (VITAMIN D3) 1000 UNITS CAPS  Take 2 capsules by mouth every morning.     . fluticasone furoate-vilanterol (BREO ELLIPTA) 100-25 MCG/INH AEPB Inhale 1 puff into the lungs daily. 90 each 3  . glimepiride (AMARYL) 4 MG tablet Take 1 tablet (4 mg total) by mouth 2 (two) times daily with a meal. 180 tablet 3  . hydrocortisone 2.5 % ointment Apply topically 2 (two) times daily. 30 g 0  . Insulin Glargine (LANTUS SOLOSTAR) 100 UNIT/ML Solostar Pen Inject 55 Units into the skin daily at 10 pm. 30 pen 5  . lisinopril (PRINIVIL,ZESTRIL) 10 MG tablet Take 1 tablet (10 mg total) by mouth daily. 90 tablet 3  . metFORMIN (GLUCOPHAGE) 1000 MG tablet Take 1 tablet (1,000 mg total) by mouth 2 (two) times daily with a meal. 180 tablet 3  . metoprolol succinate (TOPROL-XL) 25 MG 24 hr tablet Take 1 tablet (25 mg total) by mouth daily. 90 tablet 3  . Needles & Syringes MISC Insulin needles and syringes 100 each 3  . nystatin cream (MYCOSTATIN) Apply 1 application topically 3 (three) times daily. 30 g 1  . ondansetron (ZOFRAN) 4 MG tablet Take 1 tablet (4 mg total) by mouth every 6 (six) hours as needed for nausea. 12 tablet 0  . ondansetron (ZOFRAN-ODT) 8 MG disintegrating tablet Take 1 tablet (8 mg total) by mouth every 8 (eight) hours as needed for nausea or vomiting. 20 tablet 0  . oxyCODONE (OXY IR/ROXICODONE) 5 MG  immediate release tablet Take 1 tablet (5 mg total) by mouth every 6 (six) hours as needed for severe pain. 20 tablet 0  . oxyCODONE-acetaminophen (PERCOCET) 5-325 MG tablet Take 1 tablet by mouth every 4 (four) hours as needed for moderate pain. 15 tablet 0  . pravastatin (PRAVACHOL) 40 MG tablet Take 1 tablet by mouth  every evening after a meal 90 tablet 3  . tiotropium (SPIRIVA) 18 MCG inhalation capsule Place 1 capsule (18 mcg total) into inhaler and inhale daily. 90 capsule 3   No current facility-administered medications on file prior to visit.    Social History   Socioeconomic History  . Marital status: Divorced     Spouse name: Not on file  . Number of children: 3  . Years of education: Not on file  . Highest education level: Not on file  Occupational History  . Occupation: retired    Comment: [employer]  Social Needs  . Financial resource strain: Not on file  . Food insecurity:    Worry: Not on file    Inability: Not on file  . Transportation needs:    Medical: Not on file    Non-medical: Not on file  Tobacco Use  . Smoking status: Current Every Day Smoker    Packs/day: 0.50    Years: 50.00    Pack years: 25.00    Types: Cigarettes  . Smokeless tobacco: Never Used  Substance and Sexual Activity  . Alcohol use: No  . Drug use: No  . Sexual activity: Not on file  Lifestyle  . Physical activity:    Days per week: Not on file    Minutes per session: Not on file  . Stress: Not on file  Relationships  . Social connections:    Talks on phone: Not on file    Gets together: Not on file    Attends religious service: Not on file    Active member of club or organization: Not on file    Attends meetings of clubs or organizations: Not on file    Relationship status: Not on file  . Intimate partner violence:    Fear of current or ex partner: Not on file    Emotionally abused: Not on file    Physically abused: Not on file    Forced sexual activity: Not on file  Other Topics Concern  . Not on file  Social History Narrative   Marital status: divorced; not dating in 2018 but interested slightly.        Children:  3 children; 7 seven grandchildren; 1 gg      Lives: with oldest daughter, 2 grandchildren, 73 gg, 1 friend of daughters, daughter's boyfriend.       Employment: retired [employer] [place] age 42      Tobacco; 1ppd x 50 years      Alcohol: none      Exercise: a little in 2018; tries to walk for 10-15 minutes per day.      ADLs:   independent with ADLs; no transportation in 2018.      Advanced Directives:    none   Family History  Problem Relation Age of Onset  . Diabetes  Sister   . Diabetes Brother   . Hypertension Brother   . Cancer Mother 49       colon(age 81) and lung (age 40)  . Diabetes Mother   . Heart disease Mother 24       CABG  .  Hyperlipidemia Mother   . Cancer Father 46       stomach  . Diabetes Maternal Grandmother   . Mental illness Sister        Objective:    There were no vitals taken for this visit. Physical Exam No results found. Depression screen [facility] 2/9 06/14/2017 05/13/2017 05/09/2017 04/17/2017 12/17/2016  Decreased Interest 0 0 0 0 0  Down, Depressed, Hopeless 0 0 0 0 0  PHQ - 2 Score 0 0 0 0 0   Fall Risk  06/14/2017 05/13/2017 05/09/2017 04/17/2017 12/17/2016  Falls in the past year? No No No No No  Comment - - - - -  Number falls in past yr: - - - - -  Injury with Fall? - - - - -        Assessment & Plan:  No diagnosis found.  ***  No orders of the defined types were placed in this encounter.  No orders of the defined types were placed in this encounter.   No follow-ups on file.   Melisa Donofrio Elayne Guerin, M.D. Primary Care at [facility] previously Urgent [place] [address] [place],   [address] [phone] phone [phone] fax

## 2017-07-11 ENCOUNTER — Telehealth: Payer: Self-pay | Admitting: Family Medicine

## 2017-07-11 NOTE — Telephone Encounter (Unsigned)
Copied from [place] [id number]. Topic: Quick Communication - See Telephone Encounter >> Jul 11, 2017  2:55 PM Percell Belt A wrote: CRM for notification. See Telephone encounter for: 07/11/17. Pt called in state that she is on Insulin Glargine (LANTUS SOLOSTAR) 100 UNIT/ML Solostar Pen [[id number]]  , she need refill [facility] for PEN needles.  She no longer uses the syringes and needles .  She stated that this needs to be taken off of her med list.

## 2017-07-12 ENCOUNTER — Other Ambulatory Visit: Payer: Self-pay

## 2017-07-12 MED ORDER — INSULIN PEN NEEDLE 32G X 4 MM MISC: 0 refills | Status: DC

## 2017-07-23 NOTE — Progress Notes (Signed)
Subjective:    Patient ID: [name], female    DOB: [birth year], 72 y.o.   MRN: [id number]  07/24/2017  Medicare Wellness; Diabetes; Hyperlipidemia; and Hypertension    HPI This 72 y.o. female presents for [place] and follow-up of chronic medical conditions.  Last physical:  06-06-2016 Pap smear:  N/a due to age; hysterectomy Mammogram:  04/2017 Colonoscopy:  05/2014 Bone density:  08/2016 Eye exam:  04/2017    Visual Acuity Screening   Right eye Left eye Both eyes  Without correction:     With correction: '20/40 20/40 20/30 '$    BP Readings from Last 3 Encounters:  07/24/17 (!) 142/72  06/14/17 126/72  05/13/17 122/72   Wt Readings from Last 3 Encounters:  07/24/17 161 lb 12.8 oz (73.4 kg)  06/14/17 162 lb (73.5 kg)  05/13/17 164 lb (74.4 kg)   Immunization History  Administered Date(s) Administered  . Influenza Split 09/28/2011, 09/15/2012, 09/23/2013  . Influenza,inj,Quad PF,6+ Mos 09/21/2014, 09/11/2016  . Influenza-Unspecified 09/29/2015  . Pneumococcal Conjugate-13 02/07/2015  . Pneumococcal Polysaccharide-23 12/20/2011  . Tdap 09/21/2014  . Zoster 04/17/2012   DMII: has not restarted Glimepiride; fasting sugar 102.  Appetite is decreased still; just had air conditioner repaired.  Thyroid nodules: s/p repeat thyroid US.  Warranted bx of nodule 1 and nodule 8.  Nodule 2 stable. Thyroid ultrasound shows EIGHT NODULES: Nodule #1: has enlarged and meets criteria for fine needle aspiration biopsy. Nodule #2: is stable in size and was previously biopsied. Nodule #6: Has enlarged yet does not meet criteria for biopsy yet repeat thyroid ultrasound in one year recommended.  Nodule #8: has enlarged and meets criteria for fine needle aspiration biopsy.  I will place order for fine needle aspiration for nodules 1 and 8.  Ovarian cyst right: s/p pelvic ultrasound that shows: IMPRESSION: 1. The cyst in the right ovary is unchanged in size  since April 18, 2017 and 5 mm larger when compared to May of 2017. Recommend a follow-up ultrasound in 1 year to assess stability.   Review of Systems  Constitutional: Negative for activity change, appetite change, chills, diaphoresis, fatigue, fever and unexpected weight change.  HENT: Negative for congestion, dental problem, drooling, ear discharge, ear pain, facial swelling, hearing loss, mouth sores, nosebleeds, postnasal drip, rhinorrhea, sinus pressure, sneezing, sore throat, tinnitus, trouble swallowing and voice change.   Eyes: Negative for photophobia, pain, discharge, redness, itching and visual disturbance.  Respiratory: Positive for cough and shortness of breath. Negative for apnea, choking, chest tightness, wheezing and stridor.   Cardiovascular: Negative for chest pain, palpitations and leg swelling.  Gastrointestinal: Positive for diarrhea. Negative for abdominal distention, abdominal pain, anal bleeding, blood in stool, constipation, nausea, rectal pain and vomiting.  Endocrine: Negative for cold intolerance, heat intolerance, polydipsia, polyphagia and polyuria.  Genitourinary: Negative for decreased urine volume, difficulty urinating, dyspareunia, dysuria, enuresis, flank pain, frequency, genital sores, hematuria, menstrual problem, pelvic pain, urgency, vaginal bleeding, vaginal discharge and vaginal pain.  Musculoskeletal: Negative for arthralgias, back pain, gait problem, joint swelling, myalgias, neck pain and neck stiffness.  Skin: Negative for color change, pallor, rash and wound.  Allergic/Immunologic: Negative for environmental allergies, food allergies and immunocompromised state.  Neurological: Negative for dizziness, tremors, seizures, syncope, facial asymmetry, speech difficulty, weakness, light-headedness, numbness and headaches.  Hematological: Negative for adenopathy. Does not bruise/bleed easily.  Psychiatric/Behavioral: Negative for agitation, behavioral problems,  confusion, decreased concentration, dysphoric mood, hallucinations, self-injury, sleep disturbance and suicidal ideas. The patient is  not nervous/anxious and is not hyperactive.        Bedtime 12-1; wakes up 7-9.    Past Medical History:  Diagnosis Date  . COPD (chronic obstructive pulmonary disease) ([place])   . Diabetes mellitus   . Hyperlipidemia   . Hypertension   . Night sweats   . Rectal polyp    Past Surgical History:  Procedure Laterality Date  . ABDOMINAL HYSTERECTOMY  2000   fibroids; ovaries intact.  Marland Kitchen BACK SURGERY     plate put in back  . CHOLECYSTECTOMY N/A 05/01/2017   Procedure: LAPAROSCOPIC CHOLECYSTECTOMY WITH INTRAOPERATIVE CHOLANGIOGRAM;  Surgeon: Donnie Mesa, MD;  Location: [place];  Service: General;  Laterality: N/A;  . 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 TUBAL LIGATION     No Known Allergies Current Outpatient Medications on File Prior to Visit  Medication Sig Dispense Refill  . Albuterol Sulfate (PROAIR RESPICLICK) 725 (90 Base) MCG/ACT AEPB Inhale 2 Act into the lungs 4 (four) times daily as needed. 3 each 1  . aspirin 81 MG tablet Take 81 mg by mouth daily.      . blood glucose meter kit and supplies KIT Dispense based on patient and insurance preference. Use up to four times daily as directed. (FOR ICD-9 250.00, 250.01). 1 each 0  . Cholecalciferol (VITAMIN D3) 1000 UNITS CAPS Take 2 capsules by mouth every morning.     . fluticasone furoate-vilanterol (BREO ELLIPTA) 100-25 MCG/INH AEPB Inhale 1 puff into the lungs daily. 90 each 3  . glimepiride (AMARYL) 4 MG tablet Take 1 tablet (4 mg total) by mouth 2 (two) times daily with a meal. 180 tablet 3  . hydrocortisone 2.5 % ointment Apply topically 2 (two) times daily. 30 g 0  . Insulin Glargine (LANTUS SOLOSTAR) 100 UNIT/ML Solostar Pen Inject 55 Units into the skin daily at 10 pm. 30 pen 5  . Insulin Pen Needle 32G X 4 MM MISC Use as directed 100 each 0  . lisinopril (PRINIVIL,ZESTRIL) 10 MG  tablet Take 1 tablet (10 mg total) by mouth daily. 90 tablet 3  . metFORMIN (GLUCOPHAGE) 1000 MG tablet Take 1 tablet (1,000 mg total) by mouth 2 (two) times daily with a meal. 180 tablet 3  . metoprolol succinate (TOPROL-XL) 25 MG 24 hr tablet Take 1 tablet (25 mg total) by mouth daily. 90 tablet 3  . nystatin cream (MYCOSTATIN) Apply 1 application topically 3 (three) times daily. 30 g 1  . ondansetron (ZOFRAN) 4 MG tablet Take 1 tablet (4 mg total) by mouth every 6 (six) hours as needed for nausea. 12 tablet 0  . ondansetron (ZOFRAN-ODT) 8 MG disintegrating tablet Take 1 tablet (8 mg total) by mouth every 8 (eight) hours as needed for nausea or vomiting. 20 tablet 0  . oxyCODONE (OXY IR/ROXICODONE) 5 MG immediate release tablet Take 1 tablet (5 mg total) by mouth every 6 (six) hours as needed for severe pain. 20 tablet 0  . oxyCODONE-acetaminophen (PERCOCET) 5-325 MG tablet Take 1 tablet by mouth every 4 (four) hours as needed for moderate pain. 15 tablet 0  . pravastatin (PRAVACHOL) 40 MG tablet Take 1 tablet by mouth  every evening after a meal 90 tablet 3  . tiotropium (SPIRIVA) 18 MCG inhalation capsule Place 1 capsule (18 mcg total) into inhaler and inhale daily. 90 capsule 3   No current facility-administered medications on file prior to visit.    Social History   Socioeconomic History  . Marital  status: Divorced    Spouse name: Not on file  . Number of children: 3  . Years of education: Not on file  . Highest education level: Not on file  Occupational History  . Occupation: retired    Comment: [employer]  Social Needs  . Financial resource strain: Not on file  . Food insecurity:    Worry: Not on file    Inability: Not on file  . Transportation needs:    Medical: Not on file    Non-medical: Not on file  Tobacco Use  . Smoking status: Current Every Day Smoker    Packs/day: 0.50    Years: 50.00    Pack years: 25.00    Types: Cigarettes  . Smokeless tobacco: Never  Used  Substance and Sexual Activity  . Alcohol use: No  . Drug use: No  . Sexual activity: Not on file  Lifestyle  . Physical activity:    Days per week: Not on file    Minutes per session: Not on file  . Stress: Not on file  Relationships  . Social connections:    Talks on phone: Not on file    Gets together: Not on file    Attends religious service: Not on file    Active member of club or organization: Not on file    Attends meetings of clubs or organizations: Not on file    Relationship status: Not on file  . Intimate partner violence:    Fear of current or ex partner: Not on file    Emotionally abused: Not on file    Physically abused: Not on file    Forced sexual activity: Not on file  Other Topics Concern  . Not on file  Social History Narrative   Marital status: divorced; not dating in 2019 but interested slightly.        Children:  3 children; 7 seven grandchildren; 1 gg      Lives: with oldest daughter, 2 grandchildren, 34 gg, 1 friend of daughters, daughter's boyfriend.       Employment: retired [employer] [place] age 74.      Tobacco; 1ppd x 50 years.  Not interested in 2019.      Alcohol: none      Exercise: none in 2019[place]      ADLs:   independent with ADLs; no assistant devices      Advanced Directives:  None; DNR/DNI; no HCPOA.    Family History  Problem Relation Age of Onset  . Diabetes Sister   . Diabetes Brother   . Hypertension Brother   . Cancer Mother 71       colon(age 73) and lung (age 77)  . Diabetes Mother   . Heart disease Mother 64       CABG  . Hyperlipidemia Mother   . Cancer Father 39       stomach  . Diabetes Maternal Grandmother   . Mental illness Sister        Objective:    BP (!) 142/72   Pulse 97   Temp 98 F (36.7 C) (Oral)   Resp 16   Ht 5' 7.32" (1.71 m)   Wt 161 lb 12.8 oz (73.4 kg)   SpO2 99%   BMI 25.10 kg/m  Physical Exam  Constitutional: She is oriented to person, place, and time. She  appears well-developed and well-nourished. No distress.  HENT:  Head: Normocephalic and atraumatic.  Right Ear:  External ear normal.  Left Ear: External ear normal.  Nose: Nose normal.  Mouth/Throat: Oropharynx is clear and moist.  Eyes: Pupils are equal, round, and reactive to light. Conjunctivae and EOM are normal.  Neck: Normal range of motion and full passive range of motion without pain. Neck supple. No JVD present. Carotid bruit is not present. No thyromegaly present.  Cardiovascular: Normal rate, regular rhythm and normal heart sounds. Exam reveals no gallop and no friction rub.  No murmur heard. Pulmonary/Chest: Effort normal and breath sounds normal. She has no wheezes. She has no rales. Right breast exhibits no inverted nipple, no mass, no nipple discharge, no skin change and no tenderness. Left breast exhibits no inverted nipple, no mass, no nipple discharge, no skin change and no tenderness. No breast swelling, tenderness, discharge or bleeding. Breasts are symmetrical.  Abdominal: Soft. Bowel sounds are normal. She exhibits no distension and no mass. There is no tenderness. There is no rebound and no guarding.  Musculoskeletal:       Right shoulder: Normal.       Left shoulder: Normal.       Cervical back: Normal.  Lymphadenopathy:    She has no cervical adenopathy.  Neurological: She is alert and oriented to person, place, and time. She has normal reflexes. No cranial nerve deficit. She exhibits normal muscle tone. Coordination normal.  Skin: Skin is warm and dry. No rash noted. She is not diaphoretic. No erythema. No pallor.  Psychiatric: She has a normal mood and affect. Her behavior is normal. Judgment and thought content normal.  Nursing note and vitals reviewed.  No results found. Depression screen [facility] 2/9 07/24/2017 06/14/2017 05/13/2017 05/09/2017 04/17/2017  Decreased Interest 0 0 0 0 0  Down, Depressed, Hopeless 0 0 0 0 0  PHQ - 2 Score 0 0 0 0 0   Fall Risk  07/24/2017  06/14/2017 05/13/2017 05/09/2017 04/17/2017  Falls in the past year? No No No No No  Comment - - - - -  Number falls in past yr: - - - - -  Injury with Fall? - - - - -    Functional Status Survey: Is the patient deaf or have difficulty hearing?: No Does the patient have difficulty seeing, even when wearing glasses/contacts?: No Does the patient have difficulty concentrating, remembering, or making decisions?: No Does the patient have difficulty walking or climbing stairs?: No Does the patient have difficulty dressing or bathing?: No Does the patient have difficulty doing errands alone such as visiting a doctor's office or shopping?: No     Assessment & Plan:   1. Medicare annual wellness visit, subsequent   2. Essential hypertension   3. Panlobular emphysema ([place])   4. Thyroid nodule   5. Type 2 diabetes mellitus without complication, with long-term current use of insulin (HCC)   6. Pure hypercholesterolemia   7. Cyst of right ovary   8. Family history of colon cancer in mother   43. Tobacco user     -anticipatory guidance provided --- exercise, weight loss, safe driving practices, aspirin '81mg'$  daily. -obtain age appropriate screening labs and labs for chronic disease management. -moderate fall risk; no evidence of depression; no evidence of hearing loss.  Discussed advanced directives and living will; also discussed end of life issues including code status.  -DMII: well controlled; has not restarted Glimeperide. Obtain labs for chronic disease management; no changes to management. -thyroid nodules: s/p repeat thyroid ultrasound with three enlarging nodules yet only two warrant  bx at this time; will need repeat thyroid ultrasound in one year. -R ovarian cyst: present on CT abdomen and pelvis; s/p pelvic ultrasound; warrants repeat US in 6-12 months.  -HTN: moderately controlled; obtain labs; no change in therapy at this time. -tobacco abuse: pre-contemplative at this time; Smoking  cessation instruction/counseling given:  counseled patient on the dangers of tobacco use, advised patient to stop smoking, and reviewed strategies to maximize success.  Spent 3 minutes discussing smoking cessation.   Orders Placed This Encounter  Procedures  . CBC with Differential/Platelet  . Comprehensive metabolic panel    Order Specific Question:   Has the patient fasted?    Answer:   No  . Lipid panel    Order Specific Question:   Has the patient fasted?    Answer:   No  . TSH  . Microalbumin / creatinine urine ratio  . POCT urinalysis dipstick   No orders of the defined types were placed in this encounter.   Return in about 3 months (around 10/24/2017) for follow-up chronic medical conditions ZOE STALLINGS.   Crystalann Korf Elayne Guerin, M.D. Primary Care at [facility] previously Urgent [place] [address] [place], [place]  [address] [phone] phone [phone] fax

## 2017-07-24 ENCOUNTER — Other Ambulatory Visit: Payer: Self-pay

## 2017-07-24 ENCOUNTER — Ambulatory Visit (INDEPENDENT_AMBULATORY_CARE_PROVIDER_SITE_OTHER): Payer: Medicare Other | Admitting: Family Medicine

## 2017-07-24 ENCOUNTER — Encounter: Payer: Self-pay | Admitting: Family Medicine

## 2017-07-24 ENCOUNTER — Other Ambulatory Visit: Payer: Self-pay | Admitting: Family Medicine

## 2017-07-24 VITALS — BP 142/72 | HR 97 | Temp 98.0°F | Resp 16 | Ht 67.32 in | Wt 161.8 lb

## 2017-07-24 DIAGNOSIS — Z0000 Encounter for general adult medical examination without abnormal findings: Secondary | ICD-10-CM | POA: Diagnosis not present

## 2017-07-24 DIAGNOSIS — E119 Type 2 diabetes mellitus without complications: Secondary | ICD-10-CM | POA: Diagnosis not present

## 2017-07-24 DIAGNOSIS — I10 Essential (primary) hypertension: Secondary | ICD-10-CM

## 2017-07-24 DIAGNOSIS — N83201 Unspecified ovarian cyst, right side: Secondary | ICD-10-CM

## 2017-07-24 DIAGNOSIS — F17210 Nicotine dependence, cigarettes, uncomplicated: Secondary | ICD-10-CM | POA: Diagnosis not present

## 2017-07-24 DIAGNOSIS — E041 Nontoxic single thyroid nodule: Secondary | ICD-10-CM

## 2017-07-24 DIAGNOSIS — Z794 Long term (current) use of insulin: Secondary | ICD-10-CM | POA: Diagnosis not present

## 2017-07-24 DIAGNOSIS — E7800 Pure hypercholesterolemia, unspecified: Secondary | ICD-10-CM

## 2017-07-24 DIAGNOSIS — J431 Panlobular emphysema: Secondary | ICD-10-CM

## 2017-07-24 DIAGNOSIS — Z800 Family history of malignant neoplasm of digestive organs: Secondary | ICD-10-CM

## 2017-07-24 DIAGNOSIS — Z720 Tobacco use: Secondary | ICD-10-CM

## 2017-07-24 LAB — POCT URINALYSIS DIP (MANUAL ENTRY)
BILIRUBIN UA: NEGATIVE
GLUCOSE UA: NEGATIVE mg/dL
Ketones, POC UA: NEGATIVE mg/dL
Leukocytes, UA: NEGATIVE
Nitrite, UA: NEGATIVE
Protein Ur, POC: 30 mg/dL — AB
RBC UA: NEGATIVE
Spec Grav, UA: 1.015 (ref 1.010–1.025)
Urobilinogen, UA: 0.2 E.U./dL
pH, UA: 5 (ref 5.0–8.0)

## 2017-07-24 NOTE — Patient Instructions (Addendum)
You will need a repeat thyroid ultrasound and pelvic ultrasound in one year. You will need a thyroid biopsy of nodule 1 and 8.   Create a living will.  Designate a healthcare power of attorney.    IF you received an x-ray today, you will receive an invoice from [facility]. Please contact [facility] at [phone] with questions or concerns regarding your invoice.   IF you received labwork today, you will receive an invoice from [place]. Please contact [facility] at [phone] with questions or concerns regarding your invoice.   Our billing staff will not be able to assist you with questions regarding bills from these companies.  You will be contacted with the lab results as soon as they are available. The fastest way to get your results is to activate your My Chart account. Instructions are located on the last page of this paperwork. If you have not heard from [place] regarding the results in 2 weeks, please contact this office.      Preventive Care 9 Years and Older, Female Preventive care refers to lifestyle choices and visits with your health care provider that can promote health and wellness. What does preventive care include?  A yearly physical exam. This is also called an annual well check.  Dental exams once or twice a year.  Routine eye exams. Ask your health care provider how often you should have your eyes checked.  Personal lifestyle choices, including: ? Daily care of your teeth and gums. ? Regular physical activity. ? Eating a healthy diet. ? Avoiding tobacco and drug use. ? Limiting alcohol use. ? Practicing safe sex. ? Taking low-dose aspirin every day. ? Taking vitamin and mineral supplements as recommended by your health care provider. What happens during an annual well check? The services and screenings done by your health care provider during your annual well check will depend on your age, overall health, lifestyle risk factors, and family  history of disease. Counseling Your health care provider may ask you questions about your:  Alcohol use.  Tobacco use.  Drug use.  Emotional well-being.  Home and relationship well-being.  Sexual activity.  Eating habits.  History of falls.  Memory and ability to understand (cognition).  Work and work Statistician.  Reproductive health.  Screening You may have the following tests or measurements:  Height, weight, and BMI.  Blood pressure.  Lipid and cholesterol levels. These may be checked every 5 years, or more frequently if you are over 75 years old.  Skin check.  Lung cancer screening. You may have this screening every year starting at age 62 if you have a 30-pack-year history of smoking and currently smoke or have quit within the past 15 years.  Fecal occult blood test (FOBT) of the stool. You may have this test every year starting at age 59.  Flexible sigmoidoscopy or colonoscopy. You may have a sigmoidoscopy every 5 years or a colonoscopy every 10 years starting at age 89.  Hepatitis C blood test.  Hepatitis B blood test.  Sexually transmitted disease (STD) testing.  Diabetes screening. This is done by checking your blood sugar (glucose) after you have not eaten for a while (fasting). You may have this done every 1-3 years.  Bone density scan. This is done to screen for osteoporosis. You may have this done starting at age 7.  Mammogram. This may be done every 1-2 years. Talk to your health care provider about how often you should have regular mammograms.  Talk with your  health care provider about your test results, treatment options, and if necessary, the need for more tests. Vaccines Your health care provider may recommend certain vaccines, such as:  Influenza vaccine. This is recommended every year.  Tetanus, diphtheria, and acellular pertussis (Tdap, Td) vaccine. You may need a Td booster every 10 years.  Varicella vaccine. You may need this if  you have not been vaccinated.  Zoster vaccine. You may need this after age 34.  Measles, mumps, and rubella (MMR) vaccine. You may need at least one dose of MMR if you were born in 1957 or later. You may also need a second dose.  Pneumococcal 13-valent conjugate (PCV13) vaccine. One dose is recommended after age 30.  Pneumococcal polysaccharide (PPSV23) vaccine. One dose is recommended after age 50.  Meningococcal vaccine. You may need this if you have certain conditions.  Hepatitis A vaccine. You may need this if you have certain conditions or if you travel or work in places where you may be exposed to hepatitis A.  Hepatitis B vaccine. You may need this if you have certain conditions or if you travel or work in places where you may be exposed to hepatitis B.  Haemophilus influenzae type b (Hib) vaccine. You may need this if you have certain conditions.  Talk to your health care provider about which screenings and vaccines you need and how often you need them. This information is not intended to replace advice given to you by your health care provider. Make sure you discuss any questions you have with your health care provider. Document Released: 01/28/2015 Document Revised: 09/21/2015 Document Reviewed: 11/02/2014 Elsevier Interactive Patient Education  [employer].

## 2017-07-25 ENCOUNTER — Telehealth: Payer: Self-pay | Admitting: Family Medicine

## 2017-07-25 LAB — COMPREHENSIVE METABOLIC PANEL
ALT: 11 IU/L (ref 0–32)
AST: 15 IU/L (ref 0–40)
Albumin/Globulin Ratio: 1.4 (ref 1.2–2.2)
Albumin: 3.9 g/dL (ref 3.5–4.8)
Alkaline Phosphatase: 118 IU/L — ABNORMAL HIGH (ref 39–117)
BUN/Creatinine Ratio: 10 — ABNORMAL LOW (ref 12–28)
BUN: 8 mg/dL (ref 8–27)
Bilirubin Total: 0.3 mg/dL (ref 0.0–1.2)
CALCIUM: 9.6 mg/dL (ref 8.7–10.3)
CO2: 24 mmol/L (ref 20–29)
CREATININE: 0.78 mg/dL (ref 0.57–1.00)
Chloride: 106 mmol/L (ref 96–106)
GFR calc Af Amer: 88 mL/min/{1.73_m2} (ref >59)
GFR, EST NON AFRICAN AMERICAN: 76 mL/min/{1.73_m2} (ref >59)
GLOBULIN, TOTAL: 2.7 g/dL (ref 1.5–4.5)
Glucose: 93 mg/dL (ref 65–99)
Potassium: 4.2 mmol/L (ref 3.5–5.2)
Sodium: 145 mmol/L — ABNORMAL HIGH (ref 134–144)
Total Protein: 6.6 g/dL (ref 6.0–8.5)

## 2017-07-25 LAB — CBC WITH DIFFERENTIAL/PLATELET
Basophils Absolute: 0 10*3/uL (ref 0.0–0.2)
Basos: 0 %
EOS (ABSOLUTE): 0.1 10*3/uL (ref 0.0–0.4)
EOS: 1 %
HEMATOCRIT: 46.4 % (ref 34.0–46.6)
Hemoglobin: 14.5 g/dL (ref 11.1–15.9)
IMMATURE GRANULOCYTES: 0 %
Immature Grans (Abs): 0 10*3/uL (ref 0.0–0.1)
Lymphocytes Absolute: 3.4 10*3/uL — ABNORMAL HIGH (ref 0.7–3.1)
Lymphs: 37 %
MCH: 26.9 pg (ref 26.6–33.0)
MCHC: 31.3 g/dL — ABNORMAL LOW (ref 31.5–35.7)
MCV: 86 fL (ref 79–97)
MONOCYTES: 7 %
MONOS ABS: 0.7 10*3/uL (ref 0.1–0.9)
NEUTROS PCT: 55 %
Neutrophils Absolute: 5 10*3/uL (ref 1.4–7.0)
Platelets: 358 10*3/uL (ref 150–450)
RBC: 5.39 x10E6/uL — AB (ref 3.77–5.28)
RDW: 16 % — AB (ref 12.3–15.4)
WBC: 9.2 10*3/uL (ref 3.4–10.8)

## 2017-07-25 LAB — MICROALBUMIN / CREATININE URINE RATIO
Creatinine, Urine: 142.7 mg/dL
Microalb/Creat Ratio: 179.5 mg/g creat — ABNORMAL HIGH (ref 0.0–30.0)
Microalbumin, Urine: 256.2 ug/mL

## 2017-07-25 LAB — LIPID PANEL
CHOL/HDL RATIO: 2.6 ratio (ref 0.0–4.4)
Cholesterol, Total: 133 mg/dL (ref 100–199)
HDL: 51 mg/dL (ref >39)
LDL CALC: 68 mg/dL (ref 0–99)
TRIGLYCERIDES: 72 mg/dL (ref 0–149)
VLDL CHOLESTEROL CAL: 14 mg/dL (ref 5–40)

## 2017-07-25 LAB — TSH: TSH: 0.94 u[IU]/mL (ref 0.450–4.500)

## 2017-07-25 NOTE — Telephone Encounter (Signed)
Copied from [place] [id number]. Topic: Referral - Question >> Jul 25, 2017  8:26 AM Alfredia Ferguson R wrote: Reason for CRM: Kenney Houseman from [facility] is calling about referral # [id number]. They want to know if you would like Nodule 1 & Nodule 8  Callback # [phone] may leave detailed message

## 2017-07-26 ENCOUNTER — Other Ambulatory Visit: Payer: Self-pay | Admitting: Family Medicine

## 2017-07-26 DIAGNOSIS — E042 Nontoxic multinodular goiter: Secondary | ICD-10-CM

## 2017-07-26 NOTE — Telephone Encounter (Signed)
Left detailed message for [name] from [facility]. After speaking to Dr. Tamala Julian, both nodules are to be imaged.

## 2017-07-28 DIAGNOSIS — N83201 Unspecified ovarian cyst, right side: Secondary | ICD-10-CM | POA: Insufficient documentation

## 2017-07-30 ENCOUNTER — Other Ambulatory Visit: Payer: Self-pay | Admitting: Family Medicine

## 2017-07-31 ENCOUNTER — Other Ambulatory Visit: Payer: Medicare Other

## 2017-08-01 ENCOUNTER — Other Ambulatory Visit (HOSPITAL_COMMUNITY)
Admission: RE | Admit: 2017-08-01 | Discharge: 2017-08-01 | Disposition: A | Discharge status: Home / Self Care | Payer: Medicare Other | Source: Ambulatory Visit | Attending: Student | Admitting: Student

## 2017-08-01 ENCOUNTER — Ambulatory Visit
Admission: RE | Admit: 2017-08-01 | Discharge: 2017-08-01 | Disposition: A | Discharge status: Home / Self Care | Payer: Medicare Other | Source: Ambulatory Visit | Attending: Family Medicine | Admitting: Family Medicine

## 2017-08-01 DIAGNOSIS — E042 Nontoxic multinodular goiter: Secondary | ICD-10-CM | POA: Insufficient documentation

## 2017-08-01 DIAGNOSIS — E041 Nontoxic single thyroid nodule: Secondary | ICD-10-CM | POA: Diagnosis not present

## 2017-08-17 MED ORDER — LISINOPRIL 20 MG PO TABS
20.0000 mg | ORAL_TABLET | Freq: Every day | ORAL | 3 refills | Status: DC
Start: 2017-08-17 — End: 2018-10-14

## 2017-08-17 NOTE — Addendum Note (Signed)
Addended by: Wardell Honour on: 08/17/2017 12:29 PM   Modules accepted: Orders

## 2017-09-30 DIAGNOSIS — Z23 Encounter for immunization: Secondary | ICD-10-CM | POA: Diagnosis not present

## 2017-10-24 ENCOUNTER — Ambulatory Visit (INDEPENDENT_AMBULATORY_CARE_PROVIDER_SITE_OTHER): Payer: Medicare Other | Admitting: Family Medicine

## 2017-10-24 ENCOUNTER — Encounter: Payer: Self-pay | Admitting: Family Medicine

## 2017-10-24 ENCOUNTER — Other Ambulatory Visit: Payer: Self-pay

## 2017-10-24 VITALS — BP 157/82 | HR 87 | Temp 98.0°F | Resp 17 | Ht 67.32 in | Wt 167.0 lb

## 2017-10-24 DIAGNOSIS — R399 Unspecified symptoms and signs involving the genitourinary system: Secondary | ICD-10-CM

## 2017-10-24 DIAGNOSIS — Z90710 Acquired absence of both cervix and uterus: Secondary | ICD-10-CM | POA: Diagnosis not present

## 2017-10-24 DIAGNOSIS — Z794 Long term (current) use of insulin: Secondary | ICD-10-CM | POA: Diagnosis not present

## 2017-10-24 DIAGNOSIS — I10 Essential (primary) hypertension: Secondary | ICD-10-CM | POA: Diagnosis not present

## 2017-10-24 DIAGNOSIS — R159 Full incontinence of feces: Secondary | ICD-10-CM | POA: Diagnosis not present

## 2017-10-24 DIAGNOSIS — N8111 Cystocele, midline: Secondary | ICD-10-CM | POA: Diagnosis not present

## 2017-10-24 DIAGNOSIS — E119 Type 2 diabetes mellitus without complications: Secondary | ICD-10-CM | POA: Diagnosis not present

## 2017-10-24 LAB — POCT URINALYSIS DIP (MANUAL ENTRY)
Bilirubin, UA: NEGATIVE
Blood, UA: NEGATIVE
Glucose, UA: NEGATIVE mg/dL
Ketones, POC UA: NEGATIVE mg/dL
LEUKOCYTES UA: NEGATIVE
Nitrite, UA: NEGATIVE
PH UA: 5 (ref 5.0–8.0)
Spec Grav, UA: 1.015 (ref 1.010–1.025)
UROBILINOGEN UA: 0.2 U/dL

## 2017-10-24 LAB — POCT GLYCOSYLATED HEMOGLOBIN (HGB A1C): HEMOGLOBIN A1C: 7.9 % — AB (ref 4.0–5.6)

## 2017-10-24 MED ORDER — INSULIN PEN NEEDLE 32G X 4 MM MISC: 3 refills | Status: DC

## 2017-10-24 NOTE — Progress Notes (Signed)
Chief Complaint  Patient presents with  . chronic medical conditions    3 month f/u  . Medication Refill    insulin pen needles    HPI  Diarrhea Pt reports that she has been having diarrhea 20-25 minutes after eating  She states that sometimes she has episodes of diarrhea even in her sleep She states that it does not seem to be related to what she is eating She reports that she has some bacon or a sausage patty with breakfast She reports that she has diarrhea at night 1-2 times a week and she gets no warning before she soils She reports that she had a rectal polyp removed a couple years ago and since that time she has been having fecal incontinence She has a family history of colon cancer and she has been getting regular colonoscopies She had brbpr back in 2008 and her workup showed that she had a polyp in 2012 by Dr. Johney Maine.  Hypertension: Patient here for follow-up of elevated blood pressure. She is not exercising and is not adherent to low salt diet.  Blood pressure is not checked at home. Cardiac symptoms none. Patient denies chest pain, chest pressure/discomfort, claudication, dyspnea, exertional chest pressure/discomfort, fatigue, irregular heart beat, lower extremity edema and near-syncope.  Cardiovascular risk factors: diabetes mellitus and hypertension. Use of agents associated with hypertension: none.   She states that she did not take her bp meds this morning BP Readings from Last 3 Encounters:  10/24/17 (!) 157/82  07/24/17 (!) 142/72  06/14/17 126/72   Diabetes Mellitus: Patient presents for follow up of diabetes.   Lab Results  Component Value Date   HGBA1C 7.9 (A) 10/24/2017  she is taking insulin 55 units at bedtime Metformin 1027m bid She stopped taking Glimepiride after her gallbladder surgery and her episodes of hypoglycemia This morning her fasting glucose was 106 Occasionally she might get a reading that is high in the 150s She denies recent episode of  hypoglycemia Occasionally she snacks at night if she is hungry.   Lab Results  Component Value Date   CREATININE 0.78 07/24/2017    Past Medical History:  Diagnosis Date  . [place] disease[place]   . Diabetes mellitus   . Hyperlipidemia   . Hypertension   . Night sweats   . Rectal polyp     Current Outpatient Medications  Medication Sig Dispense Refill  . Albuterol Sulfate (PROAIR RESPICLICK) 1333(90 Base) MCG/ACT AEPB Inhale 2 Act into the lungs 4 (four) times daily as needed. 3 each 1  . aspirin 81 MG tablet Take 81 mg by mouth daily.      . blood glucose meter kit and supplies KIT Dispense based on patient and insurance preference. Use up to four times daily as directed. (FOR ICD-9 250.00, 250.01). 1 each 0  . Cholecalciferol (VITAMIN D3) 1000 UNITS CAPS Take 2 capsules by mouth every morning.     . fluticasone furoate-vilanterol (BREO ELLIPTA) 100-25 MCG/INH AEPB Inhale 1 puff into the lungs daily. 90 each 3  . hydrocortisone 2.5 % ointment Apply topically 2 (two) times daily. 30 g 0  . Insulin Glargine (LANTUS SOLOSTAR) 100 UNIT/ML Solostar Pen Inject 55 Units into the skin daily at 10 pm. 30 pen 5  . Insulin Pen Needle 32G X 4 MM MISC Use as directed 100 each 0  . lisinopril (PRINIVIL,ZESTRIL) 20 MG tablet Take 1 tablet (20 mg total) by mouth daily. 90 tablet 3  . metFORMIN (  GLUCOPHAGE) 1000 MG tablet Take 1 tablet (1,000 mg total) by mouth 2 (two) times daily with a meal. 180 tablet 3  . metoprolol succinate (TOPROL-XL) 25 MG 24 hr tablet Take 1 tablet (25 mg total) by mouth daily. 90 tablet 3  . nystatin cream (MYCOSTATIN) Apply 1 application topically 3 (three) times daily. 30 g 1  . ondansetron (ZOFRAN) 4 MG tablet Take 1 tablet (4 mg total) by mouth every 6 (six) hours as needed for nausea. 12 tablet 0  . ondansetron (ZOFRAN-ODT) 8 MG disintegrating tablet Take 1 tablet (8 mg total) by mouth every 8 (eight) hours as needed for nausea or  vomiting. 20 tablet 0  . oxyCODONE (OXY IR/ROXICODONE) 5 MG immediate release tablet Take 1 tablet (5 mg total) by mouth every 6 (six) hours as needed for severe pain. 20 tablet 0  . oxyCODONE-acetaminophen (PERCOCET) 5-325 MG tablet Take 1 tablet by mouth every 4 (four) hours as needed for moderate pain. 15 tablet 0  . pravastatin (PRAVACHOL) 40 MG tablet Take 1 tablet by mouth  every evening after a meal 90 tablet 3  . tiotropium (SPIRIVA) 18 MCG inhalation capsule Place 1 capsule (18 mcg total) into inhaler and inhale daily. 90 capsule 3   No current facility-administered medications for this visit.     Allergies: No Known Allergies  Past Surgical History:  Procedure Laterality Date  . ABDOMINAL HYSTERECTOMY  2000   fibroids; ovaries intact.  Marland Kitchen BACK SURGERY     plate put in back  . CHOLECYSTECTOMY N/A 05/01/2017   Procedure: LAPAROSCOPIC CHOLECYSTECTOMY WITH INTRAOPERATIVE CHOLANGIOGRAM;  Surgeon: Donnie Mesa, MD;  Location: [facility];  Service: General;  Laterality: N/A;  . 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 TUBAL LIGATION      Social History   Socioeconomic History  . Marital status: Divorced    Spouse name: Not on file  . Number of children: 3  . Years of education: Not on file  . Highest education level: Not on file  Occupational History  . Occupation: retired    Comment: [employer]  Social Needs  . Financial resource strain: Not on file  . Food insecurity:    Worry: Not on file    Inability: Not on file  . Transportation needs:    Medical: Not on file    Non-medical: Not on file  Tobacco Use  . Smoking status: Current Every Day Smoker    Packs/day: 0.50    Years: 50.00    Pack years: 25.00    Types: Cigarettes  . Smokeless tobacco: Never Used  Substance and Sexual Activity  . Alcohol use: No  . Drug use: No  . Sexual activity: Not on file  Lifestyle  . Physical activity:    Days per week: Not on file    Minutes per session: Not on  file  . Stress: Not on file  Relationships  . Social connections:    Talks on phone: Not on file    Gets together: Not on file    Attends religious service: Not on file    Active member of club or organization: Not on file    Attends meetings of clubs or organizations: Not on file    Relationship status: Not on file  Other Topics Concern  . Not on file  Social History Narrative   Marital status: divorced; not dating in 2019 but interested slightly.        Children:  3  children; 7 seven grandchildren; 1 gg      Lives: with oldest daughter, 2 grandchildren, 67 gg, 1 friend of daughters, daughter's boyfriend.       Employment: retired [employer] age 5.      Tobacco; 1ppd x 50 years.  Not interested in 2019.      Alcohol: none      Exercise: none in 2019. [place]      ADLs:   independent with ADLs; no assistant devices      Advanced Directives:  None; DNR/DNI; no HCPOA.     Family History  Problem Relation Age of Onset  . Diabetes Sister   . Diabetes Brother   . Hypertension Brother   . Cancer Mother 1       colon(age 46) and lung (age 35)  . Diabetes Mother   . Heart disease Mother 67       CABG  . Hyperlipidemia Mother   . Cancer Father 75       stomach  . Diabetes Maternal Grandmother   . Mental illness Sister      ROS Review of Systems See HPI Constitution: No fevers or chills No malaise No diaphoresis Skin: No rash or itching Eyes: no blurry vision, no double vision GU: no dysuria or hematuria Neuro: no dizziness or headaches * all others reviewed and negative   Objective: Vitals:   10/24/17 1003  BP: (!) 157/82  Pulse: 87  Resp: 17  Temp: 98 F (36.7 C)  TempSrc: Oral  SpO2: 100%  Weight: 167 lb (75.8 kg)  Height: 5' 7.32" (1.71 m)    Physical Exam  Constitutional: She is oriented to person, place, and time. She appears well-developed and well-nourished.  HENT:  Head: Normocephalic and atraumatic.  Eyes: Conjunctivae  and EOM are normal.  Cardiovascular: Normal rate, regular rhythm and normal heart sounds.  Pulmonary/Chest: Effort normal and breath sounds normal. No stridor. No respiratory distress.  Genitourinary: Rectal exam shows no external hemorrhoid, no fissure, no mass, no tenderness and anal tone normal.  Genitourinary Comments: Chaperone present Anal wink normal With visual exam the urethral meatus is protruding and there is a probable cystocele Weak rectovaginal wall  Neurological: She is alert and oriented to person, place, and time.  Skin: Skin is warm. Capillary refill takes less than 2 seconds.  Psychiatric: She has a normal mood and affect. Her behavior is normal. Judgment and thought content normal.    Assessment and Plan Verlinda was seen today for chronic medical conditions and medication refill.  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 Her a1c is at goal so although metformin loosens stools will not stop it at this time -     Ambulatory referral to Urogynecology  Essential hypertension- bp normally at goal but pt did not take meds this morning Will keep meds the same  Type 2 diabetes mellitus without complication, with long-term current use of insulin ([place])- at goal a1c<8% Continue current meds  -     POCT glycosylated hemoglobin (Hb A1C)  Urinary symptom or sign- no uti noted  -     POCT urinalysis dipstick  Midline cystocele- risk factors include previous pregnancy, hysterectomy and age -     Ambulatory referral to Urogynecology  S/P hysterectomy -     Ambulatory referral to [facility]

## 2017-10-24 NOTE — Patient Instructions (Addendum)
If you have lab work done today you will be contacted with your lab results within the next 2 weeks.  If you have not heard from [place] then please contact us. The fastest way to get your results is to register for My Chart.   IF you received an x-ray today, you will receive an invoice from [facility]. Please contact [facility] at [phone] with questions or concerns regarding your invoice.   IF you received labwork today, you will receive an invoice from [place]. Please contact [facility] at [phone] with questions or concerns regarding your invoice.   Our billing staff will not be able to assist you with questions regarding bills from these companies.  You will be contacted with the lab results as soon as they are available. The fastest way to get your results is to activate your My Chart account. Instructions are located on the last page of this paperwork. If you have not heard from [place] regarding the results in 2 weeks, please contact this office.     Cystocele Repair Cystocele repair is surgery to fix a cystocele, which is a bulging, drooping area (hernia) of the bladder that extends into the vagina. This bulging occurs on the top front (anterior) wall of the vagina. The condition may also be called an anterior prolapse or a prolapsed bladder. Tell a health care provider about:  Any allergies you have.  All medicines you are taking, including vitamins, herbs, eye drops, creams, and over-the-counter medicines.  Any problems you or family members have had with anesthetic medicines.  Any blood disorders you have.  Any surgeries you have had.  Any medical conditions you have.  Whether you are pregnant or may be pregnant. What are the risks? Generally, this is a safe procedure. However, problems may occur, including:  Infection.  Too much bleeding.  Allergic reactions to medicines.  Damage to other structures or organs.  Problems with the urinary  drainage tube (catheter), such as blockage.  Return of the cystocele.  Problems with the vaginal mesh.  What happens before the procedure? Staying hydrated Follow instructions from your health care provider about hydration, which may include:  Up to 2 hours before the procedure - you may continue to drink clear liquids, such as water, clear fruit juice, black coffee, and plain tea.  Eating and drinking restrictions Follow instructions from your health care provider about eating and drinking, which may include:  8 hours before the procedure - stop eating heavy meals or foods such as meat, fried foods, or fatty foods.  6 hours before the procedure - stop eating light meals or foods, such as toast or cereal.  6 hours before the procedure - stop drinking milk or drinks that contain milk.  2 hours before the procedure - stop drinking clear liquids.  Medicines  Ask your health care provider about: ? Changing or stopping your regular medicines. This is especially important if you are taking diabetes medicines or blood thinners. ? Taking medicines such as aspirin and ibuprofen. These medicines can thin your blood. Do not take these medicines before your procedure if your health care provider instructs you not to.  You may be given antibiotic medicine to help prevent infection. General instructions  Do not use any products that contain nicotine or tobacco-such as cigarettes and e-cigarettes-for at least 2 weeks before the procedure. If you need help quitting, ask your health care provider.  Do not drink any alcohol for 3 days before the  surgery.  Plan to have someone take you home from the hospital or clinic.  Ask your health care provider how your surgical site will be marked or identified. What happens during the procedure?  To reduce your risk of infection: ? Your health care team will wash or sanitize their hands. ? Your skin will be washed with soap.  You will be given one or  more of the following: ? A medicine to make you fall asleep (general anesthetic). ? A medicine that is injected into your spine to numb the area below and slightly above the injection site (spinal anesthetic). ? A medicine that is injected into an area of your body to numb everything below the injection site (regional anesthetic).  A thin, flexible tube (indwelling urinary catheter) will be placed in your bladder to drain urine during and after the surgery.  The surgery will be performed through the vagina. An incision will be made in the front wall of the vagina.  The muscle between the bladder and vagina will be pulled up to its normal position. Stitches (sutures) or a piece of mesh will be used to support the muscle and hold it in place. This will remove the hernia so the top of the bladder does not fall into the opening of the vagina.  The incision on the front wall of the vagina will then be closed with stitches that will dissolve safely into your body and do not need to be removed. The procedure may vary among health care providers and hospitals. What happens after the procedure?  Your blood pressure, heart rate, breathing rate, and blood oxygen level will be monitored until the medicines you were given have worn off.  You will have a catheter in place to drain your bladder. This will stay in place for 2-7 days or until your bladder is working well on its own.  You may have gauze packing in the vagina. This will be removed 1-2 days after the surgery.  You will be given pain medicine as needed.  You may be given antibiotics to fight infection. This information is not intended to replace advice given to you by your health care provider. Make sure you discuss any questions you have with your health care provider. Document Released: 12/30/1999 Document Revised: 08/05/2015 Document Reviewed: 03/24/2015 Elsevier Interactive Patient Education  2018 [employer].

## 2017-10-25 LAB — URINE CULTURE: Organism ID, Bacteria: NO GROWTH

## 2017-11-06 DIAGNOSIS — R194 Change in bowel habit: Secondary | ICD-10-CM | POA: Diagnosis not present

## 2017-11-06 DIAGNOSIS — R197 Diarrhea, unspecified: Secondary | ICD-10-CM | POA: Diagnosis not present

## 2017-11-06 DIAGNOSIS — R152 Fecal urgency: Secondary | ICD-10-CM | POA: Diagnosis not present

## 2017-12-04 DIAGNOSIS — R197 Diarrhea, unspecified: Secondary | ICD-10-CM | POA: Diagnosis not present

## 2017-12-04 DIAGNOSIS — R152 Fecal urgency: Secondary | ICD-10-CM | POA: Diagnosis not present

## 2017-12-18 DIAGNOSIS — R351 Nocturia: Secondary | ICD-10-CM | POA: Diagnosis not present

## 2017-12-18 DIAGNOSIS — R350 Frequency of micturition: Secondary | ICD-10-CM | POA: Diagnosis not present

## 2017-12-18 DIAGNOSIS — N3941 Urge incontinence: Secondary | ICD-10-CM | POA: Diagnosis not present

## 2017-12-21 ENCOUNTER — Telehealth: Payer: Self-pay | Admitting: Family Medicine

## 2017-12-21 NOTE — Telephone Encounter (Signed)
Pt is requesting a refill for her Metformin through [facility].  She states she has enough to last until they are delivered but wants to make sure that she has them before the end of the year since it will be cheaper.  Please advise

## 2017-12-28 ENCOUNTER — Other Ambulatory Visit: Payer: Self-pay

## 2017-12-28 MED ORDER — METFORMIN HCL 1000 MG PO TABS: 1000.0000 mg | ORAL_TABLET | Freq: Two times a day (BID) | ORAL | 3 refills | Status: DC

## 2017-12-28 NOTE — Telephone Encounter (Signed)
Sent - pt has appt in Jan with Nolon Rod

## 2018-01-29 ENCOUNTER — Ambulatory Visit: Payer: Medicare Other | Admitting: Family Medicine

## 2018-01-30 ENCOUNTER — Ambulatory Visit (INDEPENDENT_AMBULATORY_CARE_PROVIDER_SITE_OTHER): Payer: Medicare Other | Admitting: Family Medicine

## 2018-01-30 ENCOUNTER — Encounter: Payer: Self-pay | Admitting: Family Medicine

## 2018-01-30 ENCOUNTER — Other Ambulatory Visit: Payer: Self-pay

## 2018-01-30 VITALS — BP 148/79 | HR 89 | Temp 98.7°F | Resp 17 | Ht 67.32 in | Wt 172.6 lb

## 2018-01-30 DIAGNOSIS — E7800 Pure hypercholesterolemia, unspecified: Secondary | ICD-10-CM | POA: Diagnosis not present

## 2018-01-30 DIAGNOSIS — E119 Type 2 diabetes mellitus without complications: Secondary | ICD-10-CM | POA: Diagnosis not present

## 2018-01-30 DIAGNOSIS — J431 Panlobular emphysema: Secondary | ICD-10-CM

## 2018-01-30 DIAGNOSIS — I10 Essential (primary) hypertension: Secondary | ICD-10-CM

## 2018-01-30 DIAGNOSIS — Z794 Long term (current) use of insulin: Secondary | ICD-10-CM

## 2018-01-30 LAB — POCT GLYCOSYLATED HEMOGLOBIN (HGB A1C): Hemoglobin A1C: 7.9 % — AB (ref 4.0–5.6)

## 2018-01-30 NOTE — Patient Instructions (Signed)
° ° ° °  If you have lab work done today you will be contacted with your lab results within the next 2 weeks.  If you have not heard from us then please contact us. The fastest way to get your results is to register for My Chart. ° ° °IF you received an x-ray today, you will receive an invoice from [facility]. Please contact [facility] at [phone] with questions or concerns regarding your invoice.  ° °IF you received labwork today, you will receive an invoice from [facility]. Please contact [facility] at [phone] with questions or concerns regarding your invoice.  ° °Our billing staff will not be able to assist you with questions regarding bills from these companies. ° °You will be contacted with the lab results as soon as they are available. The fastest way to get your results is to activate your My Chart account. Instructions are located on the last page of this paperwork. If you have not heard from us regarding the results in 2 weeks, please contact this office. °  ° ° ° °

## 2018-01-30 NOTE — Progress Notes (Signed)
Established Patient Office Visit  Subjective:  Patient ID: [name], female    DOB: 1945-05-09  Age: 73 y.o. MRN: [id number]  CC:  Chief Complaint  Patient presents with  . COPD    3 month f/u  . Hypertension    3 month f/u    HPI [name] presents for   Hypertension: Patient here for follow-up of elevated blood pressure. She is not exercising and is adherent to low salt diet.  Blood pressure is well controlled at home. Cardiac symptoms none. Patient denies chest pain, chest pressure/discomfort, dyspnea, exertional chest pressure/discomfort, fatigue, irregular heart beat and lower extremity edema.  Cardiovascular risk factors: diabetes mellitus, dyslipidemia, hypertension, sedentary lifestyle and smoking/ tobacco exposure. Use of agents associated with hypertension: none.  BP Readings from Last 3 Encounters:  01/30/18 (!) 148/79  10/24/17 (!) 157/82  07/24/17 (!) 142/72   COPD: Patient complains of cough but it is not bad. She is still smoking. She is compliant with her inhaler.   Diabetes Mellitus: Patient presents for follow up of diabetes. Symptoms: none. Symptoms have stabilized. Patient denies foot ulcerations, hyperglycemia, hypoglycemia  and increase appetite.  Evaluation to date has been included: hemoglobin A1C.  Home sugars: patient does not check sugars.  Lab Results  Component Value Date   HGBA1C 7.9 (A) 01/30/2018   She is taking metoprolol, lantus 55 units daily at bedtime  Dyslipidemia: Patient presents for evaluation of lipids.  Compliance with treatment thus far has been good.  A repeat fasting lipid profile was done.  The patient does not use medications that may worsen dyslipidemias (corticosteroids, progestins, anabolic steroids, diuretics, beta-blockers, amiodarone, cyclosporine, olanzapine). The patient exercises never.  The patient is not known to have coexisting coronary artery disease.  She could not tolerate colestipol due to  diarrhea, stomach cramps and gas pains  Past Medical History:  Diagnosis Date  . COPD (chronic obstructive pulmonary disease) ([place])   . Diabetes mellitus   . Hyperlipidemia   . Hypertension   . Night sweats   . Rectal polyp     Past Surgical History:  Procedure Laterality Date  . ABDOMINAL HYSTERECTOMY  2000   fibroids; ovaries intact.  [facility] BACK SURGERY     plate put in back  . CHOLECYSTECTOMY N/A 05/01/2017   Procedure: LAPAROSCOPIC CHOLECYSTECTOMY WITH INTRAOPERATIVE CHOLANGIOGRAM;  Surgeon: Donnie Mesa, MD;  Location: [place];  Service: General;  Laterality: N/A;  . 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 TUBAL LIGATION      Family History  Problem Relation Age of Onset  . Diabetes Sister   . Diabetes Brother   . Hypertension Brother   . Cancer Mother 22       colon(age 4) and lung (age 74)  . Diabetes Mother   . Heart disease Mother 39       CABG  . Hyperlipidemia Mother   . Cancer Father 24       stomach  . Diabetes Maternal Grandmother   . Mental illness Sister     Social History   Socioeconomic History  . Marital status: Divorced    Spouse name: Not on file  . Number of children: 3  . Years of education: Not on file  . Highest education level: Not on file  Occupational History  . Occupation: retired    Comment: [employer]  Social Needs  . Financial resource strain: Not on file  . Food insecurity:  Worry: Not on file    Inability: Not on file  . Transportation needs:    Medical: Not on file    Non-medical: Not on file  Tobacco Use  . Smoking status: Current Every Day Smoker    Packs/day: 0.50    Years: 50.00    Pack years: 25.00    Types: Cigarettes  . Smokeless tobacco: Never Used  Substance and Sexual Activity  . Alcohol use: No  . Drug use: No  . Sexual activity: Not on file  Lifestyle  . Physical activity:    Days per week: Not on file    Minutes per session: Not on file  . Stress: Not on file  Relationships  .  Social connections:    Talks on phone: Not on file    Gets together: Not on file    Attends religious service: Not on file    Active member of club or organization: Not on file    Attends meetings of clubs or organizations: Not on file    Relationship status: Not on file  . Intimate partner violence:    Fear of current or ex partner: Not on file    Emotionally abused: Not on file    Physically abused: Not on file    Forced sexual activity: Not on file  Other Topics Concern  . Not on file  Social History Narrative   Marital status: divorced; not dating in 2019 but interested slightly.        Children:  3 children; 7 seven grandchildren; 1 gg      Lives: with oldest daughter, 2 grandchildren, 39 gg, 1 friend of daughters, daughter's boyfriend.       Employment: retired [employer] age 71.      Tobacco; 1ppd x 50 years.  Not interested in 2019.      Alcohol: none      Exercise: none in 2019. [place]      ADLs:   independent with ADLs; no assistant devices      Advanced Directives:  None; DNR/DNI; no HCPOA.     Outpatient Medications Prior to Visit  Medication Sig Dispense Refill  . Albuterol Sulfate (PROAIR RESPICLICK) 841 (90 Base) MCG/ACT AEPB Inhale 2 Act into the lungs 4 (four) times daily as needed. 3 each 1  . aspirin 81 MG tablet Take 81 mg by mouth daily.      . blood glucose meter kit and supplies KIT Dispense based on patient and insurance preference. Use up to four times daily as directed. (FOR ICD-9 250.00, 250.01). 1 each 0  . Cholecalciferol (VITAMIN D3) 1000 UNITS CAPS Take 2 capsules by mouth every morning.     . fluticasone furoate-vilanterol (BREO ELLIPTA) 100-25 MCG/INH AEPB Inhale 1 puff into the lungs daily. 90 each 3  . hydrocortisone 2.5 % ointment Apply topically 2 (two) times daily. 30 g 0  . Insulin Glargine (LANTUS SOLOSTAR) 100 UNIT/ML Solostar Pen Inject 55 Units into the skin daily at 10 pm. 30 pen 5  . Insulin Pen Needle 32G X 4 MM  MISC Use as directed 200 each 3  . lisinopril (PRINIVIL,ZESTRIL) 20 MG tablet Take 1 tablet (20 mg total) by mouth daily. 90 tablet 3  . metFORMIN (GLUCOPHAGE) 1000 MG tablet Take 1 tablet (1,000 mg total) by mouth 2 (two) times daily with a meal. 180 tablet 3  . metoprolol succinate (TOPROL-XL) 25 MG 24 hr tablet Take 1 tablet (25 mg total)  by mouth daily. 90 tablet 3  . nystatin cream (MYCOSTATIN) Apply 1 application topically 3 (three) times daily. 30 g 1  . ondansetron (ZOFRAN) 4 MG tablet Take 1 tablet (4 mg total) by mouth every 6 (six) hours as needed for nausea. 12 tablet 0  . ondansetron (ZOFRAN-ODT) 8 MG disintegrating tablet Take 1 tablet (8 mg total) by mouth every 8 (eight) hours as needed for nausea or vomiting. 20 tablet 0  . oxyCODONE (OXY IR/ROXICODONE) 5 MG immediate release tablet Take 1 tablet (5 mg total) by mouth every 6 (six) hours as needed for severe pain. 20 tablet 0  . oxyCODONE-acetaminophen (PERCOCET) 5-325 MG tablet Take 1 tablet by mouth every 4 (four) hours as needed for moderate pain. 15 tablet 0  . pravastatin (PRAVACHOL) 40 MG tablet Take 1 tablet by mouth  every evening after a meal 90 tablet 3  . tiotropium (SPIRIVA) 18 MCG inhalation capsule Place 1 capsule (18 mcg total) into inhaler and inhale daily. 90 capsule 3   No facility-administered medications prior to visit.     Allergies  Allergen Reactions  . Colestipol Diarrhea    Diarrhea, gas, stomach cramps     ROS Review of Systems See hpi   Objective:    Physical Exam  BP (!) 148/79 (BP Location: Right Arm, Patient Position: Sitting, Cuff Size: Large)   Pulse 89   Temp 98.7 F (37.1 C) (Oral)   Resp 17   Ht 5' 7.32" (1.71 m)   Wt 172 lb 9.6 oz (78.3 kg)   SpO2 100%   BMI 26.78 kg/m  Wt Readings from Last 3 Encounters:  01/30/18 172 lb 9.6 oz (78.3 kg)  10/24/17 167 lb (75.8 kg)  07/24/17 161 lb 12.8 oz (73.4 kg)   Physical Exam  Constitutional: Oriented to person, place, and time.  Appears well-developed and well-nourished.  HENT:  Head: Normocephalic and atraumatic.  Eyes: Conjunctivae and EOM are normal.  Cardiovascular: Normal rate, regular rhythm, normal heart sounds and intact distal pulses.  No murmur heard. Pulmonary/Chest: Effort normal and breath sounds normal. No stridor. No respiratory distress. Has no wheezes.  Neurological: Is alert and oriented to person, place, and time.  Skin: Skin is warm. Capillary refill takes less than 2 seconds.  Psychiatric: Has a normal mood and affect. Behavior is normal. Judgment and thought content normal.    There are no preventive care reminders to display for this patient.  There are no preventive care reminders to display for this patient.  Lab Results  Component Value Date   TSH 0.940 07/24/2017   Lab Results  Component Value Date   WBC 9.2 07/24/2017   HGB 14.5 07/24/2017   HCT 46.4 07/24/2017   MCV 86 07/24/2017   PLT 358 07/24/2017   Lab Results  Component Value Date   NA 141 01/30/2018   K 4.7 01/30/2018   CO2 21 01/30/2018   GLUCOSE 177 (H) 01/30/2018   BUN 13 01/30/2018   CREATININE 0.74 01/30/2018   BILITOT <0.2 01/30/2018   ALKPHOS 127 (H) 01/30/2018   AST 15 01/30/2018   ALT 15 01/30/2018   PROT 6.7 01/30/2018   ALBUMIN 3.9 01/30/2018   CALCIUM 9.4 01/30/2018   ANIONGAP 13 04/17/2017   Lab Results  Component Value Date   CHOL 148 01/30/2018   Lab Results  Component Value Date   HDL 52 01/30/2018   Lab Results  Component Value Date   LDLCALC 76 01/30/2018   Lab Results  Component Value Date   TRIG 98 01/30/2018   Lab Results  Component Value Date   CHOLHDL 2.8 01/30/2018   Lab Results  Component Value Date   HGBA1C 7.9 (A) 01/30/2018      Assessment & Plan:   Problem List Items Addressed This Visit      Cardiovascular and Mediastinum   HTN (hypertension)   - bp being monitored, pt encouraged to continue compliance    Relevant Orders   Comprehensive metabolic  panel (Completed)   Lipid panel (Completed)     Respiratory   COPD (chronic obstructive pulmonary disease) ([place])  - smoking cessation discussed and compliance with inhalers advised.      Endocrine   Type 2 diabetes mellitus without complication, with long-term current use of insulin ([place]) - Primary   -  Diabetes at goal for geriatric with a1c<8   Relevant Orders   POCT glycosylated hemoglobin (Hb A1C) (Completed)   Comprehensive metabolic panel (Completed)   Lipid panel (Completed)     Other   Pure hypercholesterolemia  -  Intolerant to cholestipol   Relevant Orders   Comprehensive metabolic panel (Completed)   Lipid panel (Completed)      No orders of the defined types were placed in this encounter.   Follow-up: No follow-ups on file.    Forrest Moron, MD

## 2018-01-31 ENCOUNTER — Telehealth: Payer: Self-pay | Admitting: Family Medicine

## 2018-01-31 LAB — COMPREHENSIVE METABOLIC PANEL
A/G RATIO: 1.4 (ref 1.2–2.2)
ALBUMIN: 3.9 g/dL (ref 3.5–4.8)
ALK PHOS: 127 IU/L — AB (ref 39–117)
ALT: 15 IU/L (ref 0–32)
AST: 15 IU/L (ref 0–40)
BUN / CREAT RATIO: 18 (ref 12–28)
BUN: 13 mg/dL (ref 8–27)
CHLORIDE: 103 mmol/L (ref 96–106)
CO2: 21 mmol/L (ref 20–29)
Calcium: 9.4 mg/dL (ref 8.7–10.3)
Creatinine, Ser: 0.74 mg/dL (ref 0.57–1.00)
GFR calc Af Amer: 93 mL/min/{1.73_m2} (ref >59)
GFR calc non Af Amer: 81 mL/min/{1.73_m2} (ref >59)
GLUCOSE: 177 mg/dL — AB (ref 65–99)
Globulin, Total: 2.8 g/dL (ref 1.5–4.5)
POTASSIUM: 4.7 mmol/L (ref 3.5–5.2)
Sodium: 141 mmol/L (ref 134–144)
Total Protein: 6.7 g/dL (ref 6.0–8.5)

## 2018-01-31 LAB — LIPID PANEL
CHOLESTEROL TOTAL: 148 mg/dL (ref 100–199)
Chol/HDL Ratio: 2.8 ratio (ref 0.0–4.4)
HDL: 52 mg/dL (ref >39)
LDL Calculated: 76 mg/dL (ref 0–99)
Triglycerides: 98 mg/dL (ref 0–149)
VLDL CHOLESTEROL CAL: 20 mg/dL (ref 5–40)

## 2018-01-31 NOTE — Telephone Encounter (Signed)
Copied from [place] [id number]. Topic: General - Other >> Jan 31, 2018  3:06 PM Percell Belt A wrote: Reason for CRM:  Pt called in stated that she was seen yesterday by Dr Nolon Rod, she was under the impression that there was going to be some type of pen for her sugar that dr Nolon Rod suggested for her to have incased her sugar dropped?  She was unsure of the name.    Please advise  Best number  [phone]

## 2018-02-01 NOTE — Telephone Encounter (Signed)
Tried to call pt about concerns, no answer and VM full so will call pt at a later time.

## 2018-02-03 MED ORDER — INSULIN PEN NEEDLE 32G X 4 MM MISC: 3 refills | Status: DC

## 2018-02-03 MED ORDER — INSULIN GLARGINE 100 UNIT/ML SOLOSTAR PEN: 55.0000 [IU] | PEN_INJECTOR | Freq: Every day | SUBCUTANEOUS | 0 refills | Status: DC

## 2018-02-03 NOTE — Addendum Note (Signed)
Addended by: Delia Chimes A on: 02/03/2018 04:48 PM   Modules accepted: Orders

## 2018-03-03 ENCOUNTER — Telehealth: Payer: Self-pay | Admitting: Family Medicine

## 2018-03-03 NOTE — Telephone Encounter (Signed)
Copied from [place] [phone] [phone]. Topic: Quick Communication - Rx Refill/Question >> Mar 03, 2018  9:41 AM Virl Axe D wrote: Medication: Pt called and stated the diabetic supply company she had been using is no longer in business. She has found a new company and needs Dr. Nolon Rod to send a rx for Lancets and Strips as soon as possible. Please advise. She also stated she has a meter but a rx could be sent over for that just incase for the future.   Has the patient contacted their pharmacy? Yes.   (Agent: If no, request that the patient contact the pharmacy for the refill.) (Agent: If yes, when and what did the pharmacy advise?)  Preferred Pharmacy (with phone number or street name): [facility]   Agent: Please be advised that RX refills may take up to 3 business days. We ask that you follow-up with your pharmacy.

## 2018-03-03 NOTE — Telephone Encounter (Signed)
Will route to office for final disposition; pt last seen in office 01/30/2018 by Dr Delia Chimes.

## 2018-03-10 NOTE — Telephone Encounter (Signed)
Form faxed to new company.

## 2018-04-03 ENCOUNTER — Other Ambulatory Visit: Payer: Self-pay | Admitting: Family Medicine

## 2018-04-03 DIAGNOSIS — Z1231 Encounter for screening mammogram for malignant neoplasm of breast: Secondary | ICD-10-CM

## 2018-04-11 ENCOUNTER — Telehealth: Payer: Self-pay | Admitting: Family Medicine

## 2018-04-11 NOTE — Telephone Encounter (Signed)
Copied from [place] [phone]. Topic: Quick Communication - Rx Refill/Question >> Apr 11, 2018 12:29 PM Sallee Provencal, [place] Leap B, NT wrote: Medication: metoprolol succinate (TOPROL-XL) 25 MG 24 hr tablet  fluticasone furoate-vilanterol (BREO ELLIPTA) 100-25 MCG/INH AEPB tiotropium (SPIRIVA) 18 MCG inhalation capsule   Has the patient contacted their pharmacy? yes (Agent: If no, request that the patient contact the pharmacy for the refill.) (Agent: If yes, when and what did the pharmacy advise?)  Preferred Pharmacy (with phone number or street name): [facility]  Agent: Please be advised that RX refills may take up to 3 business days. We ask that you follow-up with your pharmacy.

## 2018-04-15 ENCOUNTER — Other Ambulatory Visit: Payer: Self-pay

## 2018-04-15 MED ORDER — FLUTICASONE FUROATE-VILANTEROL 100-25 MCG/INH IN AEPB: 1.0000 | INHALATION_SPRAY | Freq: Every day | RESPIRATORY_TRACT | 3 refills | Status: DC

## 2018-04-15 MED ORDER — METOPROLOL SUCCINATE ER 25 MG PO TB24: 25.0000 mg | ORAL_TABLET | Freq: Every day | ORAL | 3 refills | Status: DC

## 2018-04-15 MED ORDER — TIOTROPIUM BROMIDE MONOHYDRATE 18 MCG IN CAPS: 18.0000 ug | ORAL_CAPSULE | Freq: Every day | RESPIRATORY_TRACT | 3 refills | Status: DC

## 2018-04-15 NOTE — Telephone Encounter (Signed)
Rx sent to mail order

## 2018-04-28 ENCOUNTER — Ambulatory Visit (INDEPENDENT_AMBULATORY_CARE_PROVIDER_SITE_OTHER): Payer: Medicare Other | Admitting: Family Medicine

## 2018-04-28 ENCOUNTER — Other Ambulatory Visit: Payer: Self-pay

## 2018-04-28 VITALS — BP 162/88 | HR 98 | Temp 98.8°F | Resp 16 | Ht 67.0 in | Wt 174.8 lb

## 2018-04-28 DIAGNOSIS — I10 Essential (primary) hypertension: Secondary | ICD-10-CM

## 2018-04-28 DIAGNOSIS — Z794 Long term (current) use of insulin: Secondary | ICD-10-CM | POA: Diagnosis not present

## 2018-04-28 DIAGNOSIS — E119 Type 2 diabetes mellitus without complications: Secondary | ICD-10-CM | POA: Diagnosis not present

## 2018-04-28 LAB — CMP14+EGFR
ALT: 11 IU/L (ref 0–32)
AST: 12 IU/L (ref 0–40)
Albumin/Globulin Ratio: 1.6 (ref 1.2–2.2)
Albumin: 3.9 g/dL (ref 3.7–4.7)
Alkaline Phosphatase: 121 IU/L — ABNORMAL HIGH (ref 39–117)
BUN/Creatinine Ratio: 14 (ref 12–28)
BUN: 13 mg/dL (ref 8–27)
Bilirubin Total: 0.3 mg/dL (ref 0.0–1.2)
CO2: 21 mmol/L (ref 20–29)
Calcium: 9.5 mg/dL (ref 8.7–10.3)
Chloride: 103 mmol/L (ref 96–106)
Creatinine, Ser: 0.95 mg/dL (ref 0.57–1.00)
GFR calc Af Amer: 69 mL/min/{1.73_m2} (ref >59)
GFR calc non Af Amer: 60 mL/min/{1.73_m2} (ref >59)
Globulin, Total: 2.4 g/dL (ref 1.5–4.5)
Glucose: 159 mg/dL — ABNORMAL HIGH (ref 65–99)
Potassium: 4.7 mmol/L (ref 3.5–5.2)
Sodium: 141 mmol/L (ref 134–144)
Total Protein: 6.3 g/dL (ref 6.0–8.5)

## 2018-04-28 LAB — POCT GLYCOSYLATED HEMOGLOBIN (HGB A1C): Hemoglobin A1C: 7.9 % — AB (ref 4.0–5.6)

## 2018-04-28 NOTE — Progress Notes (Signed)
102

## 2018-05-01 ENCOUNTER — Telehealth (INDEPENDENT_AMBULATORY_CARE_PROVIDER_SITE_OTHER): Payer: Medicare Other | Admitting: Family Medicine

## 2018-05-01 ENCOUNTER — Other Ambulatory Visit: Payer: Self-pay

## 2018-05-01 VITALS — Wt 175.0 lb

## 2018-05-01 DIAGNOSIS — R0600 Dyspnea, unspecified: Secondary | ICD-10-CM

## 2018-05-01 DIAGNOSIS — J431 Panlobular emphysema: Secondary | ICD-10-CM

## 2018-05-01 DIAGNOSIS — M5136 Other intervertebral disc degeneration, lumbar region: Secondary | ICD-10-CM

## 2018-05-01 DIAGNOSIS — Z794 Long term (current) use of insulin: Secondary | ICD-10-CM

## 2018-05-01 DIAGNOSIS — R0609 Other forms of dyspnea: Secondary | ICD-10-CM | POA: Diagnosis not present

## 2018-05-01 DIAGNOSIS — E1165 Type 2 diabetes mellitus with hyperglycemia: Secondary | ICD-10-CM | POA: Diagnosis not present

## 2018-05-01 NOTE — Progress Notes (Signed)
CC: followup htn/diabetes and copd. No travel outside the [place] or [place] in the past 3 weeks.

## 2018-05-01 NOTE — Patient Instructions (Signed)
° ° ° °  If you have lab work done today you will be contacted with your lab results within the next 2 weeks.  If you have not heard from us then please contact us. The fastest way to get your results is to register for My Chart. ° ° °IF you received an x-ray today, you will receive an invoice from [facility]. Please contact [facility] at [phone] with questions or concerns regarding your invoice.  ° °IF you received labwork today, you will receive an invoice from [facility]. Please contact [facility] at [phone] with questions or concerns regarding your invoice.  ° °Our billing staff will not be able to assist you with questions regarding bills from these companies. ° °You will be contacted with the lab results as soon as they are available. The fastest way to get your results is to activate your My Chart account. Instructions are located on the last page of this paperwork. If you have not heard from us regarding the results in 2 weeks, please contact this office. °  ° ° ° °

## 2018-05-01 NOTE — Progress Notes (Signed)
Telemedicine Encounter- SOAP NOTE Established Patient  This telephone encounter was conducted with the patient's (or proxy's) verbal consent via audio telecommunications: yes/no: Yes Patient was instructed to have this encounter in a suitably private space; and to only have persons present to whom they give permission to participate. In addition, patient identity was confirmed by use of name plus two identifiers (DOB and address).  I discussed the limitations, risks, security and privacy concerns of performing an evaluation and management service by telephone and the availability of in person appointments. I also discussed with the patient that there may be a patient responsible charge related to this service. The patient expressed understanding and agreed to proceed.  I spent a total of TIME; 0 MIN TO 60 MIN: 25 minutes talking with the patient or their proxy.  CC: diabetes, low back pain   Subjective   [name] is a 73 y.o. established patient. Telephone visit today for  HPI Low back pain Patient reports that for the past couple of weeks she has been having some pain radiating from the lower back area from left to the right She states that she had surgery that plates in her lumbar spine due to disc disease lumbar L4-5  Diabetes She is taking lantus 55 units She checks her sugars tid She took her sugars this morning and it was 97 She walks around and goes down the driveway to the mailbox  COPD Albuterol and Breo Ellipta She states that she still has some difficulty with her breathing  She gets winded with activity She has to take a seat after going to the mailbox She is smoking less than a pack a day   Patient Active Problem List   Diagnosis Date Noted  . Cyst of right ovary 07/28/2017  . Pure hypercholesterolemia 09/10/2016  . Thyroid nodule 09/10/2016  . COPD (chronic obstructive pulmonary disease) ([place]) 09/10/2016  . Family history of colon cancer in mother  06/06/2016  . Nuclear sclerotic cataract of both eyes 04/08/2013  . Type 2 diabetes mellitus without complication, with long-term current use of insulin ([place]) 09/13/2011  . HTN (hypertension) 09/13/2011  . Tobacco user 09/13/2011  . DDD (degenerative disc disease), lumbar 09/13/2011  . History of rectal polypectomy 08/31/2010    Past Medical History:  Diagnosis Date  . COPD (chronic obstructive pulmonary disease) ([place])   . Diabetes mellitus   . Hyperlipidemia   . Hypertension   . Night sweats   . Rectal polyp     Current Outpatient Medications  Medication Sig Dispense Refill  . aspirin 81 MG tablet Take 81 mg by mouth daily.      . blood glucose meter kit and supplies KIT Dispense based on patient and insurance preference. Use up to four times daily as directed. (FOR ICD-9 250.00, 250.01). 1 each 0  . Cholecalciferol (VITAMIN D3) 1000 UNITS CAPS Take 2 capsules by mouth every morning.     . fluticasone furoate-vilanterol (BREO ELLIPTA) 100-25 MCG/INH AEPB Inhale 1 puff into the lungs daily. 90 each 3  . hydrocortisone 2.5 % ointment Apply topically 2 (two) times daily. 30 g 0  . Insulin Glargine (LANTUS SOLOSTAR) 100 UNIT/ML Solostar Pen Inject 55 Units into the skin daily at 10 pm. 45 mL 0  . Insulin Pen Needle 32G X 4 MM MISC Use as directed 200 each 3  . lisinopril (PRINIVIL,ZESTRIL) 20 MG tablet Take 1 tablet (20 mg total) by mouth daily. 90 tablet 3  . metFORMIN (  GLUCOPHAGE) 1000 MG tablet Take 1 tablet (1,000 mg total) by mouth 2 (two) times daily with a meal. 180 tablet 3  . metoprolol succinate (TOPROL-XL) 25 MG 24 hr tablet Take 1 tablet (25 mg total) by mouth daily. 90 tablet 3  . nystatin cream (MYCOSTATIN) Apply 1 application topically 3 (three) times daily. 30 g 1  . oxyCODONE (OXY IR/ROXICODONE) 5 MG immediate release tablet Take 1 tablet (5 mg total) by mouth every 6 (six) hours as needed for severe pain. 20 tablet 0  . pravastatin (PRAVACHOL) 40 MG tablet Take 1  tablet by mouth  every evening after a meal 90 tablet 3  . tiotropium (SPIRIVA) 18 MCG inhalation capsule Place 1 capsule (18 mcg total) into inhaler and inhale daily. 90 capsule 3  . Albuterol Sulfate (PROAIR RESPICLICK) 627 (90 Base) MCG/ACT AEPB Inhale 2 Act into the lungs 4 (four) times daily as needed. (Patient not taking: Reported on 05/01/2018) 3 each 1  . ondansetron (ZOFRAN) 4 MG tablet Take 1 tablet (4 mg total) by mouth every 6 (six) hours as needed for nausea. (Patient not taking: Reported on 05/01/2018) 12 tablet 0  . ondansetron (ZOFRAN-ODT) 8 MG disintegrating tablet Take 1 tablet (8 mg total) by mouth every 8 (eight) hours as needed for nausea or vomiting. (Patient not taking: Reported on 05/01/2018) 20 tablet 0  . oxyCODONE-acetaminophen (PERCOCET) 5-325 MG tablet Take 1 tablet by mouth every 4 (four) hours as needed for moderate pain. (Patient not taking: Reported on 05/01/2018) 15 tablet 0   No current facility-administered medications for this visit.     Allergies  Allergen Reactions  . Colestipol Diarrhea    Diarrhea, gas, stomach cramps     Social History   Socioeconomic History  . Marital status: Divorced    Spouse name: Not on file  . Number of children: 3  . Years of education: Not on file  . Highest education level: Not on file  Occupational History  . Occupation: retired    Comment: [employer]  Social Needs  . Financial resource strain: Not on file  . Food insecurity:    Worry: Not on file    Inability: Not on file  . Transportation needs:    Medical: Not on file    Non-medical: Not on file  Tobacco Use  . Smoking status: Current Every Day Smoker    Packs/day: 0.50    Years: 50.00    Pack years: 25.00    Types: Cigarettes  . Smokeless tobacco: Never Used  Substance and Sexual Activity  . Alcohol use: No  . Drug use: No  . Sexual activity: Not on file  Lifestyle  . Physical activity:    Days per week: Not on file    Minutes per session:  Not on file  . Stress: Not on file  Relationships  . Social connections:    Talks on phone: Not on file    Gets together: Not on file    Attends religious service: Not on file    Active member of club or organization: Not on file    Attends meetings of clubs or organizations: Not on file    Relationship status: Not on file  . Intimate partner violence:    Fear of current or ex partner: Not on file    Emotionally abused: Not on file    Physically abused: Not on file    Forced sexual activity: Not on file  Other Topics Concern  .  Not on file  Social History Narrative   Marital status: divorced; not dating in 2019 but interested slightly.        Children:  3 children; 7 seven grandchildren; 1 gg      Lives: with oldest daughter, 2 grandchildren, 37 gg, 1 friend of daughters, daughter's boyfriend.       Employment: retired [employer] [place] age 32.      Tobacco; 1ppd x 50 years.  Not interested in 2019.      Alcohol: none      Exercise: none in 2019. [place]      ADLs:   independent with ADLs; no assistant devices      Advanced Directives:  None; DNR/DNI; no HCPOA.     ROS Review of Systems  Constitutional: Negative for activity change, appetite change, chills and fever.  HENT: Negative for congestion, nosebleeds, trouble swallowing and voice change.   Respiratory: Negative for cough, shortness of breath and wheezing.   Gastrointestinal: Negative for diarrhea, nausea and vomiting.  Genitourinary: Negative for difficulty urinating, dysuria, flank pain and hematuria.  Musculoskeletal: SEE HPI Neurological: Negative for dizziness, speech difficulty, light-headedness and numbness.  See HPI. All other review of systems negative.   Objective   Vitals as reported by the patient: Today's Vitals   05/01/18 1206  Weight: 175 lb (79.4 kg)   Lab Results  Component Value Date   HGBA1C 7.9 (A) 04/28/2018     Chemistry      Component Value Date/Time   NA 141  04/28/2018 1122   K 4.7 04/28/2018 1122   CL 103 04/28/2018 1122   CO2 21 04/28/2018 1122   BUN 13 04/28/2018 1122   CREATININE 0.95 04/28/2018 1122   CREATININE 1.14 (H) 08/31/2015 1135      Component Value Date/Time   CALCIUM 9.5 04/28/2018 1122   ALKPHOS 121 (H) 04/28/2018 1122   AST 12 04/28/2018 1122   ALT 11 04/28/2018 1122   BILITOT 0.3 04/28/2018 1122      Diagnoses and all orders for this visit:  Panlobular emphysema ([place]) Dyspnea on exertion Will get nocturnal pulse oximetry to determine if pt need oxygen at night -     Ambulatory referral to [place]  Type 2 diabetes mellitus with hyperglycemia, with long-term current use of insulin ([place])- stable At goal A1c<8%  DDD (degenerative disc disease), lumbar- mild, discussed that if symptoms worsen then pt should come in for xray     I discussed the assessment and treatment plan with the patient. The patient was provided an opportunity to ask questions and all were answered. The patient agreed with the plan and demonstrated an understanding of the instructions.   The patient was advised to call back or seek an in-person evaluation if the symptoms worsen or if the condition fails to improve as anticipated.  I provided 25 minutes of non-face-to-face time during this encounter.  Forrest Moron, MD  Primary Care at [facility]

## 2018-05-06 ENCOUNTER — Telehealth: Payer: Self-pay | Admitting: Family Medicine

## 2018-05-06 DIAGNOSIS — Z7982 Long term (current) use of aspirin: Secondary | ICD-10-CM | POA: Diagnosis not present

## 2018-05-06 DIAGNOSIS — Z794 Long term (current) use of insulin: Secondary | ICD-10-CM | POA: Diagnosis not present

## 2018-05-06 DIAGNOSIS — E119 Type 2 diabetes mellitus without complications: Secondary | ICD-10-CM | POA: Diagnosis not present

## 2018-05-06 DIAGNOSIS — I10 Essential (primary) hypertension: Secondary | ICD-10-CM | POA: Diagnosis not present

## 2018-05-06 DIAGNOSIS — J431 Panlobular emphysema: Secondary | ICD-10-CM | POA: Diagnosis not present

## 2018-05-06 DIAGNOSIS — F17210 Nicotine dependence, cigarettes, uncomplicated: Secondary | ICD-10-CM | POA: Diagnosis not present

## 2018-05-06 NOTE — Telephone Encounter (Signed)
Sill nursing orders that need to be approved

## 2018-05-06 NOTE — Telephone Encounter (Signed)
Copied from [place] [phone]. Topic: Quick Communication - Home Health Verbal Orders >> May 06, 2018 10:12 AM Valla Leaver wrote: Caller/Agency: Charlynn Grimes, RN w/ Advanced Callback Number: [phone] Requesting OT/PT/Skilled Nursing/Social Work/Speech Therapy: skilled nursing Frequency: 1x a wk for up to 9 weeks  Also patient's bp today sitting 160/80 and standing 132/60.

## 2018-05-07 NOTE — Telephone Encounter (Signed)
Also need PT verbal orders for eval. Please advise

## 2018-05-08 NOTE — Telephone Encounter (Signed)
Verbal orders okayed by provider. Called number back no answer will try again.

## 2018-05-08 NOTE — Telephone Encounter (Signed)
Verbal orders have been given and informed pt as well.

## 2018-05-09 ENCOUNTER — Ambulatory Visit: Payer: Medicare Other

## 2018-05-13 DIAGNOSIS — I10 Essential (primary) hypertension: Secondary | ICD-10-CM | POA: Diagnosis not present

## 2018-05-13 DIAGNOSIS — J431 Panlobular emphysema: Secondary | ICD-10-CM | POA: Diagnosis not present

## 2018-05-13 DIAGNOSIS — E119 Type 2 diabetes mellitus without complications: Secondary | ICD-10-CM | POA: Diagnosis not present

## 2018-05-13 DIAGNOSIS — F17210 Nicotine dependence, cigarettes, uncomplicated: Secondary | ICD-10-CM | POA: Diagnosis not present

## 2018-05-13 DIAGNOSIS — Z794 Long term (current) use of insulin: Secondary | ICD-10-CM | POA: Diagnosis not present

## 2018-05-13 DIAGNOSIS — Z7982 Long term (current) use of aspirin: Secondary | ICD-10-CM | POA: Diagnosis not present

## 2018-05-14 DIAGNOSIS — E119 Type 2 diabetes mellitus without complications: Secondary | ICD-10-CM | POA: Diagnosis not present

## 2018-05-14 DIAGNOSIS — Z7982 Long term (current) use of aspirin: Secondary | ICD-10-CM | POA: Diagnosis not present

## 2018-05-14 DIAGNOSIS — Z794 Long term (current) use of insulin: Secondary | ICD-10-CM | POA: Diagnosis not present

## 2018-05-14 DIAGNOSIS — I10 Essential (primary) hypertension: Secondary | ICD-10-CM | POA: Diagnosis not present

## 2018-05-14 DIAGNOSIS — F17210 Nicotine dependence, cigarettes, uncomplicated: Secondary | ICD-10-CM | POA: Diagnosis not present

## 2018-05-14 DIAGNOSIS — J431 Panlobular emphysema: Secondary | ICD-10-CM | POA: Diagnosis not present

## 2018-05-20 DIAGNOSIS — F17210 Nicotine dependence, cigarettes, uncomplicated: Secondary | ICD-10-CM | POA: Diagnosis not present

## 2018-05-20 DIAGNOSIS — J431 Panlobular emphysema: Secondary | ICD-10-CM | POA: Diagnosis not present

## 2018-05-20 DIAGNOSIS — Z7982 Long term (current) use of aspirin: Secondary | ICD-10-CM | POA: Diagnosis not present

## 2018-05-20 DIAGNOSIS — E119 Type 2 diabetes mellitus without complications: Secondary | ICD-10-CM | POA: Diagnosis not present

## 2018-05-20 DIAGNOSIS — I10 Essential (primary) hypertension: Secondary | ICD-10-CM | POA: Diagnosis not present

## 2018-05-20 DIAGNOSIS — Z794 Long term (current) use of insulin: Secondary | ICD-10-CM | POA: Diagnosis not present

## 2018-05-27 ENCOUNTER — Telehealth (INDEPENDENT_AMBULATORY_CARE_PROVIDER_SITE_OTHER): Payer: Medicare Other | Admitting: Registered Nurse

## 2018-05-27 ENCOUNTER — Encounter: Payer: Self-pay | Admitting: Registered Nurse

## 2018-05-27 ENCOUNTER — Other Ambulatory Visit: Payer: Self-pay

## 2018-05-27 ENCOUNTER — Telehealth: Payer: Self-pay | Admitting: Family Medicine

## 2018-05-27 DIAGNOSIS — J431 Panlobular emphysema: Secondary | ICD-10-CM | POA: Diagnosis not present

## 2018-05-27 NOTE — Progress Notes (Signed)
Telemedicine Encounter- SOAP NOTE Established Patient  This video encounter was conducted with the patient's (or proxy's) verbal consent via audio telecommunications: yes   Patient was instructed to have this encounter in a suitably private space; and to only have persons present to whom they give permission to participate. In addition, patient identity was confirmed by use of name plus two identifiers (DOB and address).  I discussed the limitations, risks, security and privacy concerns of performing an evaluation and management service by telephone and the availability of in person appointments. I also discussed with the patient that there may be a patient responsible charge related to this service. The patient expressed understanding and agreed to proceed.  I spent a total of 15 minutes talking with the patient or their proxy.  CC: F/u for COPD  Subjective   [name] is a 73 y.o. established patient. Telephone visit today to follow up for COPD  Pt recently seen by Dr. Nolon Rod, her PCP, on 05/01/18 for a follow up on chronic conditions. At this time, Dr. Nolon Rod hoped to request a nocturnal O2 sat monitoring by home health, and the referral was placed.   She has been visited by a home health RN twice with a third visit planned for the upcoming Tuesday: 06/03/18. RN is Event organiser from [employer].  She states that she has also been visited by a respiratory therapist who performed some functional testing around her home, and saw her to be fit enough to dismiss her from her care. She stated that at times during this testing, her O2 sat had reached 100%.   She denies any nocturnal testing at this time.      Patient Active Problem List   Diagnosis Date Noted  . Cyst of right ovary 07/28/2017  . Pure hypercholesterolemia 09/10/2016  . Thyroid nodule 09/10/2016  . COPD (chronic obstructive pulmonary disease) ([place]) 09/10/2016  . Family history of colon cancer in  mother 06/06/2016  . Nuclear sclerotic cataract of both eyes 04/08/2013  . Type 2 diabetes mellitus without complication, with long-term current use of insulin ([place]) 09/13/2011  . HTN (hypertension) 09/13/2011  . Tobacco user 09/13/2011  . DDD (degenerative disc disease), lumbar 09/13/2011  . History of rectal polypectomy 08/31/2010    Past Medical History:  Diagnosis Date  . COPD (chronic obstructive pulmonary disease) ([place])   . Diabetes mellitus   . Hyperlipidemia   . Hypertension   . Night sweats   . Rectal polyp     Current Outpatient Medications  Medication Sig Dispense Refill  . aspirin 81 MG tablet Take 81 mg by mouth daily.      . blood glucose meter kit and supplies KIT Dispense based on patient and insurance preference. Use up to four times daily as directed. (FOR ICD-9 250.00, 250.01). 1 each 0  . Cholecalciferol (VITAMIN D3) 1000 UNITS CAPS Take 2 capsules by mouth every morning.     . fluticasone furoate-vilanterol (BREO ELLIPTA) 100-25 MCG/INH AEPB Inhale 1 puff into the lungs daily. 90 each 3  . hydrocortisone 2.5 % ointment Apply topically 2 (two) times daily. 30 g 0  . Insulin Pen Needle 32G X 4 MM MISC Use as directed 200 each 3  . lisinopril (PRINIVIL,ZESTRIL) 20 MG tablet Take 1 tablet (20 mg total) by mouth daily. 90 tablet 3  . metFORMIN (GLUCOPHAGE) 1000 MG tablet Take 1 tablet (1,000 mg total) by mouth 2 (two) times daily with a meal. 180 tablet 3  .  metoprolol succinate (TOPROL-XL) 25 MG 24 hr tablet Take 1 tablet (25 mg total) by mouth daily. 90 tablet 3  . nystatin cream (MYCOSTATIN) Apply 1 application topically 3 (three) times daily. 30 g 1  . oxyCODONE (OXY IR/ROXICODONE) 5 MG immediate release tablet Take 1 tablet (5 mg total) by mouth every 6 (six) hours as needed for severe pain. 20 tablet 0  . pravastatin (PRAVACHOL) 40 MG tablet Take 1 tablet by mouth  every evening after a meal 90 tablet 3  . tiotropium (SPIRIVA) 18 MCG inhalation capsule Place 1  capsule (18 mcg total) into inhaler and inhale daily. 90 capsule 3  . Albuterol Sulfate (PROAIR RESPICLICK) 416 (90 Base) MCG/ACT AEPB Inhale 2 Act into the lungs 4 (four) times daily as needed. (Patient not taking: Reported on 05/01/2018) 3 each 1  . Insulin Glargine (LANTUS SOLOSTAR) 100 UNIT/ML Solostar Pen Inject 55 Units into the skin daily at 10 pm. 45 mL 0  . ondansetron (ZOFRAN) 4 MG tablet Take 1 tablet (4 mg total) by mouth every 6 (six) hours as needed for nausea. (Patient not taking: Reported on 05/01/2018) 12 tablet 0  . ondansetron (ZOFRAN-ODT) 8 MG disintegrating tablet Take 1 tablet (8 mg total) by mouth every 8 (eight) hours as needed for nausea or vomiting. (Patient not taking: Reported on 05/01/2018) 20 tablet 0  . oxyCODONE-acetaminophen (PERCOCET) 5-325 MG tablet Take 1 tablet by mouth every 4 (four) hours as needed for moderate pain. (Patient not taking: Reported on 05/01/2018) 15 tablet 0   No current facility-administered medications for this visit.     Allergies  Allergen Reactions  . Colestipol Diarrhea    Diarrhea, gas, stomach cramps     Social History   Socioeconomic History  . Marital status: Divorced    Spouse name: Not on file  . Number of children: 3  . Years of education: Not on file  . Highest education level: Not on file  Occupational History  . Occupation: retired    Comment: [employer]  Social Needs  . Financial resource strain: Not on file  . Food insecurity:    Worry: Not on file    Inability: Not on file  . Transportation needs:    Medical: Not on file    Non-medical: Not on file  Tobacco Use  . Smoking status: Current Every Day Smoker    Packs/day: 0.50    Years: 50.00    Pack years: 25.00    Types: Cigarettes  . Smokeless tobacco: Never Used  Substance and Sexual Activity  . Alcohol use: No  . Drug use: No  . Sexual activity: Not on file  Lifestyle  . Physical activity:    Days per week: Not on file    Minutes per  session: Not on file  . Stress: Not on file  Relationships  . Social connections:    Talks on phone: Not on file    Gets together: Not on file    Attends religious service: Not on file    Active member of club or organization: Not on file    Attends meetings of clubs or organizations: Not on file    Relationship status: Not on file  . Intimate partner violence:    Fear of current or ex partner: Not on file    Emotionally abused: Not on file    Physically abused: Not on file    Forced sexual activity: Not on file  Other Topics Concern  . Not  on file  Social History Narrative   Marital status: divorced; not dating in 2019 but interested slightly.        Children:  3 children; 7 seven grandchildren; 1 gg      Lives: with oldest daughter, 2 grandchildren, 48 gg, 1 friend of daughters, daughter's boyfriend.       Employment: retired [employer] [place] age 50.      Tobacco; 1ppd x 50 years.  Not interested in 2019.      Alcohol: none      Exercise: none in 2019[place]      ADLs:   independent with ADLs; no assistant devices      Advanced Directives:  None; DNR/DNI; no HCPOA.     Review of Systems  Constitutional: Negative for fever and malaise/fatigue.  Respiratory: Positive for shortness of breath (No more than normal ). Negative for cough and sputum production.   Cardiovascular: Negative for chest pain.  Neurological: Negative for dizziness, tingling, loss of consciousness, weakness and headaches.    Objective   Vitals as reported by the patient: There were no vitals filed for this visit. Recent blood sugars: 127 fasting 168 post prandial 101 fasting 93 fasting   Diagnoses and all orders for this visit:  Panlobular emphysema ([place])  PLAN:  Discussed briefly with Dr. Nolon Rod, patient's PCP: since she has been dismissed by respiratory therapy without further concerns, she is fine to resume regular follow up.   Plan to have next appointment for  patient in late July / Early August  Encouraged patient to contact sooner with any change or worsening in symptoms    I discussed the assessment and treatment plan with the patient. The patient was provided an opportunity to ask questions and all were answered. The patient agreed with the plan and demonstrated an understanding of the instructions.   The patient was advised to call back or seek an in-person evaluation if the symptoms worsen or if the condition fails to improve as anticipated.  I provided 15 minutes of non-face-to-face time during this encounter.  Maximiano Coss, NP  Primary Care at [facility]

## 2018-05-27 NOTE — Telephone Encounter (Signed)
Notified Dr. Nolon Rod of message.

## 2018-05-27 NOTE — Progress Notes (Signed)
Pt c/o COPD follow up according to home advance care.

## 2018-05-27 NOTE — Telephone Encounter (Signed)
Copied from [place] [id number]. Topic: General - Other >> May 27, 2018 11:17 AM Lennox Solders wrote: Reason for TYO:MAYOKH rn adv home health is calling to let dr Nolon Rod know she will not visit the patient this week, however donita will see patient next week if pt is still stable she will be discharge. Pt was seen for skilled nursing

## 2018-06-04 DIAGNOSIS — E119 Type 2 diabetes mellitus without complications: Secondary | ICD-10-CM | POA: Diagnosis not present

## 2018-06-04 DIAGNOSIS — I10 Essential (primary) hypertension: Secondary | ICD-10-CM | POA: Diagnosis not present

## 2018-06-04 DIAGNOSIS — J431 Panlobular emphysema: Secondary | ICD-10-CM | POA: Diagnosis not present

## 2018-06-04 DIAGNOSIS — Z7982 Long term (current) use of aspirin: Secondary | ICD-10-CM | POA: Diagnosis not present

## 2018-06-04 DIAGNOSIS — Z794 Long term (current) use of insulin: Secondary | ICD-10-CM | POA: Diagnosis not present

## 2018-06-04 DIAGNOSIS — F17210 Nicotine dependence, cigarettes, uncomplicated: Secondary | ICD-10-CM | POA: Diagnosis not present

## 2018-06-30 ENCOUNTER — Ambulatory Visit
Admission: RE | Admit: 2018-06-30 | Discharge: 2018-06-30 | Disposition: A | Discharge status: Home / Self Care | Payer: Medicare Other | Source: Ambulatory Visit | Attending: Family Medicine | Admitting: Family Medicine

## 2018-06-30 ENCOUNTER — Other Ambulatory Visit: Payer: Self-pay

## 2018-06-30 DIAGNOSIS — Z1231 Encounter for screening mammogram for malignant neoplasm of breast: Secondary | ICD-10-CM | POA: Diagnosis not present

## 2018-08-22 ENCOUNTER — Other Ambulatory Visit: Payer: Self-pay | Admitting: Family Medicine

## 2018-08-22 NOTE — Telephone Encounter (Signed)
Requested medication (s) are due for refill today:   Yes  Requested medication (s) are on the active medication list:   Yes  Future visit scheduled:   Yes 10/14/2018 with Dr. Nolon Rod   Last ordered: 02/03/2018-05/04/2018  45 ML  0 refills.   3 month supply written for.   Needs enough to last until appt 10/14/2018.   Requested Prescriptions  Pending Prescriptions Disp Refills   Insulin Glargine (LANTUS SOLOSTAR) 100 UNIT/ML Solostar Pen 45 mL 0    Sig: Inject 55 Units into the skin daily at 10 pm.     Endocrinology:  Diabetes - Insulins Failed - 08/22/2018  2:40 PM      Failed - Valid encounter within last 6 months    Recent Outpatient Visits          3 months ago Type 2 diabetes mellitus without complication, with long-term current use of insulin ([place])   Primary Care at [place], MD   6 months ago Type 2 diabetes mellitus without complication, with long-term current use of insulin ([place])   Primary Care at [facility], Arlie Solomons, MD   10 months ago Full incontinence of feces   Primary Care at [facility], Arlie Solomons, MD   1 year ago Medicare annual wellness visit, subsequent   Primary Care at [facility], Renette Butters, MD   1 year ago Hypoglycemia   Primary Care at [facility], Renette Butters, MD      Future Appointments            In 1 month Forrest Moron, MD Primary Care at [place], [place] is between 0 and 7.9 and within 180 days    Hemoglobin A1C  Date Value Ref Range Status  04/28/2018 7.9 (A) 4.0 - 5.6 % Final   Hgb A1c MFr Bld  Date Value Ref Range Status  06/06/2016 9.7 (H) 4.8 - 5.6 % Final    Comment:             Pre-diabetes: 5.7 - 6.4          Diabetes: >6.4          Glycemic control for adults with diabetes: <7.0

## 2018-08-22 NOTE — Telephone Encounter (Signed)
Refill: Insulin Glargine (LANTUS SOLOSTAR) 100 UNIT/ML Solostar Pen [[id number]  lisinopril (PRINIVIL,ZESTRIL) 20 MG tablet [[id number]]  pravastatin (PRAVACHOL) 40 MG tablet [[id number]]    Pharmacy:  [place], [place] [phone] (Phone) [phone] (Fax)

## 2018-08-25 MED ORDER — LANTUS SOLOSTAR 100 UNIT/ML ~~LOC~~ SOPN: 55.0000 [IU] | PEN_INJECTOR | Freq: Every day | SUBCUTANEOUS | 0 refills | Status: DC

## 2018-08-25 NOTE — Telephone Encounter (Signed)
No further refill without office visit °

## 2018-10-08 ENCOUNTER — Other Ambulatory Visit: Payer: Self-pay | Admitting: Family Medicine

## 2018-10-08 NOTE — Telephone Encounter (Signed)
Requested medication (s) are due for refill today: yes  Requested medication (s) are on the active medication list: yes  Last refill:  08/25/2018  Future visit scheduled: yes  Notes to clinic: requesting 1 year supply   Requested Prescriptions  Pending Prescriptions Disp Refills   LANTUS SOLOSTAR 100 UNIT/ML Solostar Pen [Pharmacy Med Name: LANTUS SOLOSTAR 5X3 ML SOLUTION] 45 mL 3    Sig: INJECT SUBCUTANEOUSLY [place]     Endocrinology:  Diabetes - Insulins Passed - 10/08/2018  6:48 AM      Passed - HBA1C is between 0 and 7.9 and within 180 days    Hemoglobin A1C  Date Value Ref Range Status  04/28/2018 7.9 (A) 4.0 - 5.6 % Final   Hgb A1c MFr Bld  Date Value Ref Range Status  06/06/2016 9.7 (H) 4.8 - 5.6 % Final    Comment:             Pre-diabetes: 5.7 - 6.4          Diabetes: >6.4          Glycemic control for adults with diabetes: <7.0          Passed - Valid encounter within last 6 months    Recent Outpatient Visits          4 months ago Panlobular emphysema ([place])   Primary Care at [place], NP   5 months ago Panlobular emphysema [facility])   Primary Care at [facility], Zoe A, MD   5 months ago Type 2 diabetes mellitus without complication, with long-term current use of insulin ([place])   Primary Care at [facility], Zoe A, MD   8 months ago Type 2 diabetes mellitus without complication, with long-term current use of insulin ([place])   Primary Care at [place], MD   11 months ago Full incontinence of feces   Primary Care at [place], MD      Future Appointments            In 6 days Forrest Moron, MD Primary Care at [place], [facility]

## 2018-10-10 ENCOUNTER — Other Ambulatory Visit: Payer: Self-pay | Admitting: Family Medicine

## 2018-10-14 ENCOUNTER — Other Ambulatory Visit: Payer: Self-pay

## 2018-10-14 ENCOUNTER — Encounter: Payer: Self-pay | Admitting: Family Medicine

## 2018-10-14 ENCOUNTER — Ambulatory Visit (INDEPENDENT_AMBULATORY_CARE_PROVIDER_SITE_OTHER): Payer: Medicare Other | Admitting: Family Medicine

## 2018-10-14 ENCOUNTER — Ambulatory Visit (INDEPENDENT_AMBULATORY_CARE_PROVIDER_SITE_OTHER): Payer: Medicare Other

## 2018-10-14 VITALS — BP 138/64 | HR 101 | Temp 98.7°F | Resp 17 | Ht 67.0 in | Wt 175.0 lb

## 2018-10-14 DIAGNOSIS — I10 Essential (primary) hypertension: Secondary | ICD-10-CM | POA: Diagnosis not present

## 2018-10-14 DIAGNOSIS — Z794 Long term (current) use of insulin: Secondary | ICD-10-CM

## 2018-10-14 DIAGNOSIS — M545 Low back pain, unspecified: Secondary | ICD-10-CM

## 2018-10-14 DIAGNOSIS — G8929 Other chronic pain: Secondary | ICD-10-CM

## 2018-10-14 DIAGNOSIS — M5136 Other intervertebral disc degeneration, lumbar region: Secondary | ICD-10-CM | POA: Diagnosis not present

## 2018-10-14 DIAGNOSIS — E1165 Type 2 diabetes mellitus with hyperglycemia: Secondary | ICD-10-CM

## 2018-10-14 DIAGNOSIS — Z0001 Encounter for general adult medical examination with abnormal findings: Secondary | ICD-10-CM

## 2018-10-14 DIAGNOSIS — Z0000 Encounter for general adult medical examination without abnormal findings: Secondary | ICD-10-CM

## 2018-10-14 DIAGNOSIS — Z23 Encounter for immunization: Secondary | ICD-10-CM | POA: Diagnosis not present

## 2018-10-14 LAB — COMPREHENSIVE METABOLIC PANEL
ALT: 14 IU/L (ref 0–32)
AST: 13 IU/L (ref 0–40)
Albumin/Globulin Ratio: 1.6 (ref 1.2–2.2)
Albumin: 4.1 g/dL (ref 3.7–4.7)
Alkaline Phosphatase: 121 IU/L — ABNORMAL HIGH (ref 39–117)
BUN/Creatinine Ratio: 10 — ABNORMAL LOW (ref 12–28)
BUN: 9 mg/dL (ref 8–27)
Bilirubin Total: 0.2 mg/dL (ref 0.0–1.2)
CO2: 20 mmol/L (ref 20–29)
Calcium: 9.7 mg/dL (ref 8.7–10.3)
Chloride: 105 mmol/L (ref 96–106)
Creatinine, Ser: 0.9 mg/dL (ref 0.57–1.00)
GFR calc Af Amer: 73 mL/min/{1.73_m2} (ref >59)
GFR calc non Af Amer: 64 mL/min/{1.73_m2} (ref >59)
Globulin, Total: 2.5 g/dL (ref 1.5–4.5)
Glucose: 153 mg/dL — ABNORMAL HIGH (ref 65–99)
Potassium: 4.4 mmol/L (ref 3.5–5.2)
Sodium: 140 mmol/L (ref 134–144)
Total Protein: 6.6 g/dL (ref 6.0–8.5)

## 2018-10-14 LAB — LIPID PANEL
Chol/HDL Ratio: 2.7 ratio (ref 0.0–4.4)
Cholesterol, Total: 135 mg/dL (ref 100–199)
HDL: 50 mg/dL (ref >39)
LDL Chol Calc (NIH): 68 mg/dL (ref 0–99)
Triglycerides: 92 mg/dL (ref 0–149)
VLDL Cholesterol Cal: 17 mg/dL (ref 5–40)

## 2018-10-14 LAB — HEMOGLOBIN A1C
Est. average glucose Bld gHb Est-mCnc: 235 mg/dL
Hgb A1c MFr Bld: 9.8 % — ABNORMAL HIGH (ref 4.8–5.6)

## 2018-10-14 MED ORDER — LISINOPRIL 20 MG PO TABS: 20.0000 mg | ORAL_TABLET | Freq: Every day | ORAL | 3 refills | Status: DC

## 2018-10-14 MED ORDER — PRAVASTATIN SODIUM 40 MG PO TABS: ORAL_TABLET | ORAL | 3 refills | Status: DC

## 2018-10-14 MED ORDER — MELOXICAM 7.5 MG PO TABS: 7.5000 mg | ORAL_TABLET | Freq: Every day | ORAL | 0 refills | Status: DC

## 2018-10-14 NOTE — Progress Notes (Signed)
QUICK REFERENCE INFORMATION: The ABCs of Providing the Annual Wellness Visit  CMS.gov Medicare Learning Network  [facility] Wellness Visit  Subjective:   [name] is a 73 y.o. Female who presents for an Annual Wellness Visit.  1.5 weeks ago pain was 9/10, took ibuprofen and oxycodone and went to bed Pain was midline low back where the back plate was placed in 7793 She is wondering if something has shifted She denies any radiating pain No numbness  Patient Active Problem List   Diagnosis Date Noted  . Cyst of right ovary 07/28/2017  . Pure hypercholesterolemia 09/10/2016  . Thyroid nodule 09/10/2016  . COPD (chronic obstructive pulmonary disease) ([place]) 09/10/2016  . Family history of colon cancer in mother 06/06/2016  . Nuclear sclerotic cataract of both eyes 04/08/2013  . Type 2 diabetes mellitus without complication, with long-term current use of insulin ([place]) 09/13/2011  . HTN (hypertension) 09/13/2011  . Tobacco user 09/13/2011  . DDD (degenerative disc disease), lumbar 09/13/2011  . History of rectal polypectomy 08/31/2010    Past Medical History:  Diagnosis Date  . COPD (chronic obstructive pulmonary disease) ([place])   . Diabetes mellitus   . Hyperlipidemia   . Hypertension   . Night sweats   . Rectal polyp      Past Surgical History:  Procedure Laterality Date  . ABDOMINAL HYSTERECTOMY  2000   fibroids; ovaries intact.  [facility] BACK SURGERY     plate put in back  . CHOLECYSTECTOMY N/A 05/01/2017   Procedure: LAPAROSCOPIC CHOLECYSTECTOMY WITH INTRAOPERATIVE CHOLANGIOGRAM;  Surgeon: Donnie Mesa, MD;  Location: [facility];  Service: General;  Laterality: N/A;  . 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 TUBAL LIGATION       Outpatient Medications Prior to Visit  Medication Sig Dispense Refill  . Albuterol Sulfate (PROAIR RESPICLICK) 903 (90 Base) MCG/ACT AEPB Inhale 2 Act into the lungs 4 (four) times daily as needed. 3 each 1  . aspirin 81 MG tablet  Take 81 mg by mouth daily.      . blood glucose meter kit and supplies KIT Dispense based on patient and insurance preference. Use up to four times daily as directed. (FOR ICD-9 250.00, 250.01). 1 each 0  . Cholecalciferol (VITAMIN D3) 1000 UNITS CAPS Take 2 capsules by mouth every morning.     . fluticasone furoate-vilanterol (BREO ELLIPTA) 100-25 MCG/INH AEPB Inhale 1 puff into the lungs daily. 90 each 3  . Insulin Pen Needle 32G X 4 MM MISC Use as directed 200 each 3  . LANTUS SOLOSTAR 100 UNIT/ML Solostar Pen INJECT SUBCUTANEOUSLY 55  UNITS DAILY AT 10PM 45 mL 3  . lisinopril (PRINIVIL,ZESTRIL) 20 MG tablet Take 1 tablet (20 mg total) by mouth daily. 90 tablet 3  . metFORMIN (GLUCOPHAGE) 1000 MG tablet TAKE 1 TABLET BY MOUTH TWO  TIMES DAILY WITH A MEAL 180 tablet 3  . metoprolol succinate (TOPROL-XL) 25 MG 24 hr tablet Take 1 tablet (25 mg total) by mouth daily. 90 tablet 3  . nystatin cream (MYCOSTATIN) Apply 1 application topically 3 (three) times daily. 30 g 1  . pravastatin (PRAVACHOL) 40 MG tablet Take 1 tablet by mouth  every evening after a meal 90 tablet 3  . tiotropium (SPIRIVA) 18 MCG inhalation capsule Place 1 capsule (18 mcg total) into inhaler and inhale daily. 90 capsule 3  . ondansetron (ZOFRAN-ODT) 8 MG disintegrating tablet Take 1 tablet (8 mg total) by mouth every 8 (eight) hours as needed  for nausea or vomiting. (Patient not taking: Reported on 05/01/2018) 20 tablet 0  . hydrocortisone 2.5 % ointment Apply topically 2 (two) times daily. (Patient not taking: Reported on 10/14/2018) 30 g 0  . ondansetron (ZOFRAN) 4 MG tablet Take 1 tablet (4 mg total) by mouth every 6 (six) hours as needed for nausea. (Patient not taking: Reported on 05/01/2018) 12 tablet 0  . oxyCODONE (OXY IR/ROXICODONE) 5 MG immediate release tablet Take 1 tablet (5 mg total) by mouth every 6 (six) hours as needed for severe pain. (Patient not taking: Reported on 10/14/2018) 20 tablet 0  . oxyCODONE-acetaminophen  (PERCOCET) 5-325 MG tablet Take 1 tablet by mouth every 4 (four) hours as needed for moderate pain. (Patient not taking: Reported on 05/01/2018) 15 tablet 0   No facility-administered medications prior to visit.     Allergies  Allergen Reactions  . Colestipol Diarrhea    Diarrhea, gas, stomach cramps      Family History  Problem Relation Age of Onset  . Diabetes Sister   . Diabetes Brother   . Hypertension Brother   . Cancer Mother 13       colon(age 81) and lung (age 76)  . Diabetes Mother   . Heart disease Mother 51       CABG  . Hyperlipidemia Mother   . Cancer Father 64       stomach  . Diabetes Maternal Grandmother   . Mental illness Sister      Social History   Socioeconomic History  . Marital status: Divorced    Spouse name: Not on file  . Number of children: 3  . Years of education: Not on file  . Highest education level: Not on file  Occupational History  . Occupation: retired    Comment: [employer]  Social Needs  . Financial resource strain: Not on file  . Food insecurity    Worry: Not on file    Inability: Not on file  . Transportation needs    Medical: Not on file    Non-medical: Not on file  Tobacco Use  . Smoking status: Current Every Day Smoker    Packs/day: 0.50    Years: 50.00    Pack years: 25.00    Types: Cigarettes  . Smokeless tobacco: Never Used  Substance and Sexual Activity  . Alcohol use: No  . Drug use: No  . Sexual activity: Not on file  Lifestyle  . Physical activity    Days per week: Not on file    Minutes per session: Not on file  . Stress: Not on file  Relationships  . Social Herbalist on phone: Not on file    Gets together: Not on file    Attends religious service: Not on file    Active member of club or organization: Not on file    Attends meetings of clubs or organizations: Not on file    Relationship status: Not on file  Other Topics Concern  . Not on file  Social History Narrative    Marital status: divorced; not dating in 2019 but interested slightly.        Children:  3 children; 7 seven grandchildren; 1 gg      Lives: with oldest daughter, 2 grandchildren, 76 gg, 1 friend of daughters, daughter's boyfriend.       Employment: retired [employer] [place] age 1.      Tobacco; 1ppd x 50 years.  Not interested in  2019.      Alcohol: none      Exercise: none in 2019[place]      ADLs:   independent with ADLs; no assistant devices      Advanced Directives:  None; DNR/DNI; no HCPOA.       Recent Hospitalizations? No  Health Habits: Current exercise activities include: none Exercise: 0 times/week. Diet: well balanced    Health Risk Assessment: The patient has completed a Health Risk Assessment. This has been reveiwed with them and has been scanned into the [facility] system as an attached document.  Current Medical Providers and Suppliers: [facility] Patient Care Team: Forrest Moron, MD as PCP - General (Internal Medicine) No future appointments.   Age-appropriate Screening Schedule: The list below includes current immunization status and future screening recommendations based on patient's age. Orders for these recommended tests are listed in the plan section. The patient has been provided with a written plan. Immunization History  Administered Date(s) Administered  . Influenza Split 09/28/2011, 09/15/2012, 09/23/2013  . Influenza,inj,Quad PF,6+ Mos 09/21/2014, 09/11/2016  . Influenza-Unspecified 09/29/2015  . Pneumococcal Conjugate-13 02/07/2015  . Pneumococcal Polysaccharide-23 12/20/2011  . Tdap 09/21/2014  . Zoster 04/17/2012    Health Maintenance reviewed -  See orders, flu shot, mammogram  Depression Screen-PHQ2/9 completed today  Depression screen [facility] 2/9 10/14/2018 05/27/2018 05/01/2018 01/30/2018 10/24/2017  Decreased Interest 0 0 0 0 0  Down, Depressed, Hopeless 0 0 0 0 0  PHQ - 2 Score 0 0 0 0 0       Depression Severity and  Treatment Recommendations:  0-4= None  5-9= Mild / Treatment: Support, educate to call if worse; return in one month  10-14= Moderate / Treatment: Support, watchful waiting; Antidepressant or Psycotherapy  15-19= Moderately severe / Treatment: Antidepressant OR Psychotherapy  >= 20 = Major depression, severe / Antidepressant AND Psychotherapy  Functional Status Survey:   Is the patient deaf or have difficulty hearing?: No Does the patient have difficulty seeing, even when wearing glasses/contacts?: No Does the patient have difficulty concentrating, remembering, or making decisions?: No Does the patient have difficulty walking or climbing stairs?: No Does the patient have difficulty dressing or bathing?: No Does the patient have difficulty doing errands alone such as visiting a doctor's office or shopping?: No  Hearing Evaluation: 1. Do you have trouble hearing the television when others do not?   No 2. Do you have to strain to hear/understand conversations? No   Advanced Care Planning: 1. Patient has executed an Advance Directive: No, has it but it is not notarized  2. If no, patient was given the opportunity to execute an Advance Directive today? Yes 3. Are the patient's advanced directives in [place]? No 4. This patient has the ability to prepare an Advance Directive: Yes 5. Provider is willing to follow the patient's wishes: Yes  Cognitive Assessment: Does the patient have evidence of cognitive impairment? No The patient does not have any evidence of any cognitive problems and denies any  change in mood/affect, appearance, speech, memory or motor skills.  Identification of Risk Factors: Risk factors include: diabetes mellitus, hypertension and smoking  ROS Review of Systems  Constitutional: Negative for activity change, appetite change, chills and fever.  HENT: Negative for congestion, nosebleeds, trouble swallowing and voice change.   Respiratory: Negative for cough,  shortness of breath and wheezing.   Gastrointestinal: Negative for diarrhea, nausea and vomiting.  Genitourinary: Negative for difficulty urinating, dysuria, flank pain and hematuria.  Musculoskeletal: see hpi Neurological: Negative for dizziness, speech difficulty, light-headedness and numbness.  See HPI. All other review of systems negative.   Objective:   Vitals:   10/14/18 0801 10/14/18 0810  BP: (!) 167/93 138/64  Pulse: (!) 101   Resp: 17   Temp: 98.7 F (37.1 C)   TempSrc: Oral   SpO2: 99%   Weight: 175 lb (79.4 kg)   Height: '5\' 7"'$  (1.702 m)     Body mass index is 27.41 kg/m.  Physical Exam Constitutional:      Appearance: Normal appearance.  HENT:     Head: Normocephalic and atraumatic.     Right Ear: Tympanic membrane normal.     Left Ear: Tympanic membrane normal.  Eyes:     Extraocular Movements: Extraocular movements intact.     Conjunctiva/sclera: Conjunctivae normal.  Cardiovascular:     Rate and Rhythm: Normal rate and regular rhythm.     Pulses: Normal pulses.     Heart sounds: No murmur.  Pulmonary:     Effort: Pulmonary effort is normal. No respiratory distress.     Breath sounds: Normal breath sounds. No stridor. No wheezing, rhonchi or rales.  Chest:     Chest wall: No tenderness.  Abdominal:     General: Abdomen is flat. Bowel sounds are normal.     Palpations: Abdomen is soft. There is no mass.     Tenderness: There is no abdominal tenderness. There is no right CVA tenderness, left CVA tenderness, guarding or rebound.     Hernia: No hernia is present.  Skin:    General: Skin is warm.     Capillary Refill: Capillary refill takes less than 2 seconds.  Neurological:     General: No focal deficit present.     Mental Status: She is alert and oriented to person, place, and time.  Psychiatric:        Mood and Affect: Mood normal.        Behavior: Behavior normal.        Thought Content: Thought content normal.        Judgment: Judgment normal.    CLINICAL DATA:  Post L4-L5 laminectomy  EXAM: LUMBAR SPINE - COMPLETE 4+ VIEW  COMPARISON:  MR lumbar spine 08/27/2006  FINDINGS: Osseous mineralization mildly decreased.  Five non-rib-bearing lumbar vertebra.  Prior anterior fusion of C4-C5.  Mild endplate spur formation at C3-C4 and C2-C3.  Vertebral body heights maintained without fracture or subluxation.  SI joint spaces preserved.  IMPRESSION: Prior anterior fusion C4-C5.  Mild degenerative disc disease changes L2-L3 and L3-L4.  No acute abnormalities.   Electronically Signed   By: Lavonia Dana M.D.   On: 10/14/2018 08:44  Lab Results  Component Value Date   CREATININE 0.95 04/28/2018        Assessment/Plan:   Patient Self-Management and Personalized Health Advice The patient has been provided with information about:  quit smoking, attempt to lose weight and improve dietary compliance  During the course of the visit the patient was educated and counseled about appropriate screening and preventive services including:   lab testing as noted in orders section, return annually or prn     Body mass index is 27.41 kg/m. Discussed the patient's BMI with her. The BMI BMI is not in the acceptable range; BMI management plan is completed  [name] was seen today for annual exam.  Diagnoses and all orders for this visit:  Encounter for Medicare annual wellness exam- discussed age appropriate screenings  Type 2 diabetes mellitus with hyperglycemia, with long-term current use of insulin (HCC) -     HM Diabetes Foot Exam -     Comprehensive metabolic panel -     Hemoglobin A1c -     Lipid panel  Need for prophylactic vaccination and inoculation against influenza -     Flu Vaccine QUAD High Dose(Fluad)  Essential hypertension -     Comprehensive metabolic panel -     Lipid panel   Low back pain - arthritis, will give meloxicam prn     No follow-ups on file.  No future  appointments.  Patient Instructions       If you have lab work done today you will be contacted with your lab results within the next 2 weeks.  If you have not heard from [place] then please contact us. The fastest way to get your results is to register for My Chart.   IF you received an x-ray today, you will receive an invoice from [facility]. Please contact [facility] at [phone] with questions or concerns regarding your invoice.   IF you received labwork today, you will receive an invoice from [facility]. Please contact [facility] at [phone] with questions or concerns regarding your invoice.   Our billing staff will not be able to assist you with questions regarding bills from these companies.  You will be contacted with the lab results as soon as they are available. The fastest way to get your results is to activate your My Chart account. Instructions are located on the last page of this paperwork. If you have not heard from [place] regarding the results in 2 weeks, please contact this office.        If you have lab work done today you will be contacted with your lab results within the next 2 weeks.  If you have not heard from [place] then please contact us. The fastest way to get your results is to register for My Chart.   IF you received an x-ray today, you will receive an invoice from [facility] Radiology. Please contact [facility] Radiology at [phone] with questions or concerns regarding your invoice.   IF you received labwork today, you will receive an invoice from [facility]. Please contact [facility] at [phone] with questions or concerns regarding your invoice.   Our billing staff will not be able to assist you with questions regarding bills from these companies.  You will be contacted with the lab results as soon as they are available. The fastest way to get your results is to activate your My Chart account. Instructions are located on the last page of  this paperwork. If you have not heard from [place] regarding the results in 2 weeks, please contact this office.       An after visit summary with all of these plans was given to the patient.

## 2018-10-14 NOTE — Patient Instructions (Addendum)
  CLINICAL DATA:  Post L4-L5 laminectomy  EXAM: LUMBAR SPINE - COMPLETE 4+ VIEW  COMPARISON:  MR lumbar spine 08/27/2006  FINDINGS: Osseous mineralization mildly decreased.  Five non-rib-bearing lumbar vertebra.  Prior anterior fusion of C4-C5.  Mild endplate spur formation at C3-C4 and C2-C3.  Vertebral body heights maintained without fracture or subluxation.  SI joint spaces preserved.  IMPRESSION: Prior anterior fusion C4-C5.  Mild degenerative disc disease changes L2-L3 and L3-L4.  No acute abnormalities.   Electronically Signed   By: Lavonia Dana M.D.   On: 10/14/2018 08:44    If you have lab work done today you will be contacted with your lab results within the next 2 weeks.  If you have not heard from [place] then please contact us. The fastest way to get your results is to register for My Chart.   IF you received an x-ray today, you will receive an invoice from [facility]iology. Please contact [facility]iology at [phone] with questions or concerns regarding your invoice.   IF you received labwork today, you will receive an invoice from [facility]. Please contact [facility] at [phone] with questions or concerns regarding your invoice.   Our billing staff will not be able to assist you with questions regarding bills from these companies.  You will be contacted with the lab results as soon as they are available. The fastest way to get your results is to activate your My Chart account. Instructions are located on the last page of this paperwork. If you have not heard from [place] regarding the results in 2 weeks, please contact this office.        If you have lab work done today you will be contacted with your lab results within the next 2 weeks.  If you have not heard from [place] then please contact us. The fastest way to get your results is to register for My Chart.   IF you received an x-ray today, you will receive an invoice from [facility]  Radiology. Please contact [facility] Radiology at [phone] with questions or concerns regarding your invoice.   IF you received labwork today, you will receive an invoice from [place]. Please contact [facility] at [phone] with questions or concerns regarding your invoice.   Our billing staff will not be able to assist you with questions regarding bills from these companies.  You will be contacted with the lab results as soon as they are available. The fastest way to get your results is to activate your My Chart account. Instructions are located on the last page of this paperwork. If you have not heard from [place] regarding the results in 2 weeks, please contact this office.

## 2018-10-24 ENCOUNTER — Telehealth: Payer: Self-pay | Admitting: Family Medicine

## 2018-10-24 NOTE — Progress Notes (Signed)
Called pt 2x to schedule virtual with provider. VM box full

## 2018-10-24 NOTE — Telephone Encounter (Signed)
Called pt 2x to schedule telemed with Nolon Rod as an overbook. OK per provider. VM is full, no msg

## 2018-11-11 ENCOUNTER — Other Ambulatory Visit: Payer: Self-pay | Admitting: Family Medicine

## 2018-11-11 MED ORDER — PRAVASTATIN SODIUM 40 MG PO TABS: ORAL_TABLET | ORAL | 0 refills | Status: DC

## 2018-11-11 MED ORDER — METFORMIN HCL 1000 MG PO TABS: ORAL_TABLET | ORAL | 0 refills | Status: DC

## 2018-11-11 NOTE — Telephone Encounter (Signed)
Medication Refill - Medication:  lisinopril (ZESTRIL) 20 MG tablet [id number]  pravastatin (PRAVACHOL) 40 MG tablet [id number]  90 day supply per in   Has the patient contacted their pharmacy? No. (Agent: If no, request that the patient contact the pharmacy for the refill.) (Agent: If yes, when and what did the pharmacy advise?)  Preferred Pharmacy (with phone number or street name):  [facility] [place] [phone] (Phone) [phone] (Fax)     Agent: Please be advised that RX refills may take up to 3 business days. We ask that you follow-up with your pharmacy.

## 2018-11-12 ENCOUNTER — Telehealth: Payer: Self-pay | Admitting: Family Medicine

## 2018-11-12 NOTE — Telephone Encounter (Signed)
Copied from [place] [phone]. Topic: General - Other >> Nov 12, 2018  3:34 PM Leward Quan A wrote: Reason for CRM: Patient called to say that her Rx for lisinopril (ZESTRIL) 20 MG tablet was sent to [facility] in September but she did not pick it up she would like the Rx sent to [facility], [place] [phone] (Phone) [phone] (Fax)  ASAP please she will be out next week and need meds mailed out so that she can receive it on time

## 2018-11-17 ENCOUNTER — Telehealth: Payer: Self-pay | Admitting: Family Medicine

## 2018-11-18 ENCOUNTER — Other Ambulatory Visit: Payer: Self-pay | Admitting: *Deleted

## 2018-11-18 MED ORDER — LISINOPRIL 20 MG PO TABS: 20.0000 mg | ORAL_TABLET | Freq: Every day | ORAL | 2 refills | Status: DC

## 2018-11-18 NOTE — Telephone Encounter (Signed)
Prescription changed to [facility]

## 2018-12-05 ENCOUNTER — Other Ambulatory Visit: Payer: Self-pay | Admitting: Family Medicine

## 2018-12-30 ENCOUNTER — Other Ambulatory Visit: Payer: Self-pay | Admitting: Family Medicine

## 2018-12-30 NOTE — Telephone Encounter (Signed)
Requested medication (s) are due for refill today: no  Requested medication (s) are on the active medication list: yes  Last refill:  11/11/2018  Future visit scheduled:yes  Notes to clinic:  Patient would like a year supply   Requested Prescriptions  Pending Prescriptions Disp Refills   pravastatin (PRAVACHOL) 40 MG tablet [Pharmacy Med Name: PRAVASTATIN SOD 40MG  TABLET] 90 tablet 3    Sig: TAKE 1 TABLET BY MOUTH IN  THE EVENING AFTER A MEAL      Cardiovascular:  Antilipid - Statins Passed - 12/30/2018  5:28 AM      Passed - Total Cholesterol in normal range and within 360 days    Cholesterol, Total  Date Value Ref Range Status  10/14/2018 135 100 - 199 mg/dL Final          Passed - LDL in normal range and within 360 days    LDL Chol Calc (NIH)  Date Value Ref Range Status  10/14/2018 68 0 - 99 mg/dL Final   Direct LDL  Date Value Ref Range Status  04/17/2012 85 mg/dL Final    Comment:    ATP III Classification (LDL):       < 100        mg/dL         Optimal      100 - 129     mg/dL         Near or Above Optimal      130 - 159     mg/dL         Borderline High      160 - 189     mg/dL         High       > 190        mg/dL         Very High            Passed - HDL in normal range and within 360 days    HDL  Date Value Ref Range Status  10/14/2018 50 >39 mg/dL Final          Passed - Triglycerides in normal range and within 360 days    Triglycerides  Date Value Ref Range Status  10/14/2018 92 0 - 149 mg/dL Final          Passed - Patient is not pregnant      Passed - Valid encounter within last 12 months    Recent Outpatient Visits           2 months ago Encounter for [employer] annual wellness exam   Primary Care at [facility], Arlie Solomons, MD   7 months ago Panlobular emphysema [facility])   Primary [facility] at [facility], [place], NP   8 months ago Panlobular emphysema [facility])   Primary Care at [facility], [place] A, MD   8 months ago Type 2 diabetes  mellitus without complication, with long-term current use of insulin ([place])   Primary Care at [facility], [place] A, MD   11 months ago Type 2 diabetes mellitus without complication, with long-term current use of insulin ([place])   Primary Care at [place], MD       Future Appointments             In 1 week Forrest Moron, MD Primary Care at [place], [facility]

## 2019-01-12 ENCOUNTER — Ambulatory Visit (INDEPENDENT_AMBULATORY_CARE_PROVIDER_SITE_OTHER): Payer: Medicare Other | Admitting: Family Medicine

## 2019-01-12 ENCOUNTER — Other Ambulatory Visit: Payer: Self-pay

## 2019-01-12 ENCOUNTER — Encounter: Payer: Self-pay | Admitting: Family Medicine

## 2019-01-12 VITALS — BP 130/80 | HR 88 | Temp 98.0°F | Resp 16 | Ht 67.0 in | Wt 178.0 lb

## 2019-01-12 DIAGNOSIS — Z0100 Encounter for examination of eyes and vision without abnormal findings: Secondary | ICD-10-CM

## 2019-01-12 DIAGNOSIS — Z794 Long term (current) use of insulin: Secondary | ICD-10-CM

## 2019-01-12 DIAGNOSIS — E7800 Pure hypercholesterolemia, unspecified: Secondary | ICD-10-CM

## 2019-01-12 DIAGNOSIS — R079 Chest pain, unspecified: Secondary | ICD-10-CM

## 2019-01-12 DIAGNOSIS — R8290 Unspecified abnormal findings in urine: Secondary | ICD-10-CM

## 2019-01-12 DIAGNOSIS — M25551 Pain in right hip: Secondary | ICD-10-CM

## 2019-01-12 DIAGNOSIS — E119 Type 2 diabetes mellitus without complications: Secondary | ICD-10-CM

## 2019-01-12 DIAGNOSIS — I10 Essential (primary) hypertension: Secondary | ICD-10-CM

## 2019-01-12 DIAGNOSIS — J431 Panlobular emphysema: Secondary | ICD-10-CM

## 2019-01-12 DIAGNOSIS — E1165 Type 2 diabetes mellitus with hyperglycemia: Secondary | ICD-10-CM

## 2019-01-12 LAB — POCT URINALYSIS DIP (MANUAL ENTRY)
Bilirubin, UA: NEGATIVE
Blood, UA: NEGATIVE
Glucose, UA: NEGATIVE mg/dL
Ketones, POC UA: NEGATIVE mg/dL
Leukocytes, UA: NEGATIVE
Nitrite, UA: NEGATIVE
Protein Ur, POC: >=300 mg/dL — AB
Spec Grav, UA: 1.015 (ref 1.010–1.025)
Urobilinogen, UA: 0.2 E.U./dL
pH, UA: 5 (ref 5.0–8.0)

## 2019-01-12 LAB — POCT GLYCOSYLATED HEMOGLOBIN (HGB A1C): Hemoglobin A1C: 8.5 % — AB (ref 4.0–5.6)

## 2019-01-12 NOTE — Progress Notes (Signed)
Established Patient Office Visit  Subjective:  Patient ID: [name], female    DOB: 1945/03/12  Age: 73 y.o. MRN: [id number]  CC:  Chief Complaint  Patient presents with  . Diabetes    HPI [name] presents for   Diabetes Mellitus: Patient presents for follow up of diabetes. Symptoms: hyperglycemia. Symptoms have gradually worsened. Patient denies hypoglycemia .  Evaluation to date has been included: hemoglobin A1C. She reports that this morning her sugar was 139  For breakfast she had a boiled egg, wheat toast and coffee for breakfast Her sugars are up and down but since the holidays her sugars have been mostly high.  Some of them have been 200s mostly. Diabetes Standard of Care- she goes to [facility] Dr. In [place], she is compliant with eye exams - is on linisopril - is on pravastatin - is compliant with metformin, insulin lantus 55 units daily -  She is on asa 25m daily Lab Results  Component Value Date   CREATININE 0.90 10/14/2018     Lab Results  Component Value Date   HGBA1C 9.8 (H) 10/14/2018   Right hip pain She reports that she has right side hip pain States that it is aggravated by lying on her left side She states that she feels like the pain is in one spot (above the gluteus maximus)  COPD  She has a persistent cough due to her COPD but denies any history of purulent mucus She is using her Breo Ellipta, Spiriva and albuterol.  Hypertension: Patient here for follow-up of elevated blood pressure. She is not exercising and is adherent to low salt diet.  Blood pressure is not check at home. Cardiac symptoms none. Patient denies chest pain, chest pressure/discomfort, claudication, exertional chest pressure/discomfort, irregular heart beat, lower extremity edema and near-syncope.  Cardiovascular risk factors: advanced age (older than 521for men, 651for women), diabetes mellitus, dyslipidemia and hypertension. Use of agents associated with  hypertension: none. History of target organ damage: none.  She reports that she is taking her metoprolol-XL, lisinopril 240m  Past Medical History:  Diagnosis Date  . COPD [place] obstructive [place] disease) [place]  . Diabetes mellitus   . Hyperlipidemia   . Hypertension   . Night sweats   . Rectal polyp     Past Surgical History:  Procedure Laterality Date  . ABDOMINAL HYSTERECTOMY  2000   fibroids; ovaries intact.  . Marland KitchenACK SURGERY     plate put in back  . CHOLECYSTECTOMY N/A 05/01/2017   Procedure: LAPAROSCOPIC CHOLECYSTECTOMY WITH INTRAOPERATIVE CHOLANGIOGRAM;  Surgeon: TsDonnie MesaMD;  [place] [facility] Service: General;  Laterality: N/A;  . 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 TUBAL LIGATION      Family History  Problem Relation Age of Onset  . Diabetes Sister   . Diabetes Brother   . Hypertension Brother   . Cancer Mother 7439     colon(age 438and lung (age 74 . Diabetes Mother   . Heart disease Mother 7124     CABG  . Hyperlipidemia Mother   . Cancer Father 4943     stomach  . Diabetes Maternal Grandmother   . Mental illness Sister     Social History   Socioeconomic History  . Marital status: Divorced    Spouse name: Not on file  . Number of children: 3  . Years of education: Not on file  .  Highest education level: Not on file  Occupational History  . Occupation: retired    Comment: [employer] Use  . Smoking status: Current Every Day Smoker    Packs/day: 0.50    Years: 50.00    Pack years: 25.00    Types: Cigarettes  . Smokeless tobacco: Never Used  Substance and Sexual Activity  . Alcohol use: No  . Drug use: No  . Sexual activity: Not on file  Other Topics Concern  . Not on file  Social History Narrative   Marital status: divorced; not dating in 2019 but interested slightly.        Children:  3 children; 7 seven grandchildren; 1 gg      Lives: with oldest daughter, 2 grandchildren, 33 gg, 1 friend  of daughters, daughter's boyfriend.       Employment: retired [employer] age 52.      Tobacco; 1ppd x 50 years.  Not interested in 2019.      Alcohol: none      Exercise: none in 2019. [place]      ADLs:   independent with ADLs; no assistant devices      Advanced Directives:  None; DNR/DNI; no HCPOA.    Social Determinants of Health   Financial Resource Strain:   . Difficulty of Paying Living Expenses: Not on file  Food Insecurity:   . Worried About Charity fundraiser in the Last Year: Not on file  . Ran Out of Food in the Last Year: Not on file  Transportation Needs:   . Lack of Transportation (Medical): Not on file  . Lack of Transportation (Non-Medical): Not on file  Physical Activity:   . Days of Exercise per Week: Not on file  . Minutes of Exercise per Session: Not on file  Stress:   . Feeling of Stress : Not on file  Social Connections:   . Frequency of Communication with Friends and Family: Not on file  . Frequency of Social Gatherings with Friends and Family: Not on file  . Attends Religious Services: Not on file  . Active Member of Clubs or Organizations: Not on file  . Attends Archivist Meetings: Not on file  . Marital Status: Not on file  Intimate Partner Violence:   . Fear of Current or Ex-Partner: Not on file  . Emotionally Abused: Not on file  . Physically Abused: Not on file  . Sexually Abused: Not on file    Outpatient Medications Prior to Visit  Medication Sig Dispense Refill  . Albuterol Sulfate (PROAIR RESPICLICK) 737 (90 Base) MCG/ACT AEPB Inhale 2 Act into the lungs 4 (four) times daily as needed. 3 each 1  . aspirin 81 MG tablet Take 81 mg by mouth daily.      . blood glucose meter kit and supplies KIT Dispense based on patient and insurance preference. Use up to four times daily as directed. (FOR ICD-9 250.00, 250.01). 1 each 0  . Cholecalciferol (VITAMIN D3) 1000 UNITS CAPS Take 2 capsules by mouth every morning.      . fluticasone furoate-vilanterol (BREO ELLIPTA) 100-25 MCG/INH AEPB Inhale 1 puff into the lungs daily. 90 each 3  . Insulin Pen Needle (BD PEN NEEDLE NANO U/F) 32G X 4 MM MISC USE AS DIRECTED 90 each 1  . LANTUS SOLOSTAR 100 UNIT/ML Solostar Pen INJECT SUBCUTANEOUSLY 55  UNITS DAILY AT 10PM 45 mL 3  . lisinopril (ZESTRIL) 20 MG  tablet Take 1 tablet (20 mg total) by mouth daily. 90 tablet 2  . meloxicam (MOBIC) 7.5 MG tablet Take 1 tablet (7.5 mg total) by mouth daily. 30 tablet 0  . metFORMIN (GLUCOPHAGE) 1000 MG tablet TAKE 1 TABLET BY MOUTH TWO  TIMES DAILY WITH A MEAL 180 tablet 0  . metoprolol succinate (TOPROL-XL) 25 MG 24 hr tablet Take 1 tablet (25 mg total) by mouth daily. 90 tablet 3  . nystatin cream (MYCOSTATIN) Apply 1 application topically 3 (three) times daily. 30 g 1  . ondansetron (ZOFRAN-ODT) 8 MG disintegrating tablet Take 1 tablet (8 mg total) by mouth every 8 (eight) hours as needed for nausea or vomiting. (Patient not taking: Reported on 05/01/2018) 20 tablet 0  . pravastatin (PRAVACHOL) 40 MG tablet Take 1 tablet by mouth  every evening after a meal 90 tablet 0  . tiotropium (SPIRIVA) 18 MCG inhalation capsule Place 1 capsule (18 mcg total) into inhaler and inhale daily. 90 capsule 3   No facility-administered medications prior to visit.    Allergies  Allergen Reactions  . Colestipol Diarrhea    Diarrhea, gas, stomach cramps     ROS Review of Systems Review of Systems  Constitutional: Negative for activity change, appetite change, chills and fever.  HENT: Negative for congestion, nosebleeds, trouble swallowing and voice change.   Respiratory: +cough, +shortness of breath but no wheezing.   Gastrointestinal: Negative for diarrhea, nausea and vomiting.  Genitourinary: +dysuria Musculoskeletal: +right hip pain Neurological: Negative for dizziness, speech difficulty, light-headedness and numbness.  See HPI. All other review of systems negative.  She reports  intermittent chest cramps that is on both sides of the chest    Objective:    Physical Exam  BP 130/80 (BP Location: Left Arm, Patient Position: Sitting, Cuff Size: Normal)   Pulse 88   Temp 98 F (36.7 C) (Oral)   Resp 16   Ht _0  (1.702 m)   Wt 178 lb (80.7 kg)   SpO2 99%   BMI 27.88 kg/m  Wt Readings from Last 3 Encounters:  01/12/19 178 lb (80.7 kg)  10/14/18 175 lb (79.4 kg)  05/01/18 175 lb (79.4 kg)   Physical Exam  Constitutional: Oriented to person, place, and time. Appears well-developed and well-nourished.  HENT:  Head: Normocephalic and atraumatic.  Eyes: Conjunctivae and EOM are normal.  Cardiovascular: Normal rate, regular rhythm, normal heart sounds and intact distal pulses.  No murmur heard. Pulmonary/Chest: Effort normal and breath sounds normal. No stridor. No respiratory distress. Has no wheezes.  Neurological: Is alert and oriented to person, place, and time.  Skin: Skin is warm. Capillary refill takes less than 2 seconds.  Psychiatric: Has a normal mood and affect. Behavior is normal. Judgment and thought content normal.    Health Maintenance Due  Topic Date Due  . OPHTHALMOLOGY EXAM  05/07/2018    There are no preventive care reminders to display for this patient.  Lab Results  Component Value Date   TSH 0.940 07/24/2017   Lab Results  Component Value Date   WBC 9.2 07/24/2017   HGB 14.5 07/24/2017   HCT 46.4 07/24/2017   MCV 86 07/24/2017   PLT 358 07/24/2017   Lab Results  Component Value Date   NA 140 10/14/2018   K 4.4 10/14/2018   CO2 20 10/14/2018   GLUCOSE 153 (H) 10/14/2018   BUN 9 10/14/2018   CREATININE 0.90 10/14/2018   BILITOT 0.2 10/14/2018   ALKPHOS 121 (H) 10/14/2018  AST 13 10/14/2018   ALT 14 10/14/2018   PROT 6.6 10/14/2018   ALBUMIN 4.1 10/14/2018   CALCIUM 9.7 10/14/2018   ANIONGAP 13 04/17/2017   Lab Results  Component Value Date   CHOL 135 10/14/2018   Lab Results  Component Value Date   HDL 50  10/14/2018   Lab Results  Component Value Date   LDLCALC 68 10/14/2018   Lab Results  Component Value Date   TRIG 92 10/14/2018   Lab Results  Component Value Date   CHOLHDL 2.7 10/14/2018   Lab Results  Component Value Date   HGBA1C 9.8 (H) 10/14/2018      Assessment & Plan:   Problem List Items Addressed This Visit      Cardiovascular and Mediastinum   HTN (hypertension)  -  Patient's blood pressure is at goal of 139/89 or less. Condition is stable. Continue current medications and treatment plan. I recommend that you exercise for 30-45 minutes 5 days a week. I also recommend a balanced diet with fruits and vegetables every day, lean meats, and little fried foods. The DASH diet (you can find this online) is a good example of this.    Relevant Orders   Comprehensive metabolic panel   EKG 74-UZHQ     Respiratory   COPD (chronic obstructive pulmonary disease) (HCC)  - stable, cpm     Other   Pure hypercholesterolemia  - will assess   Relevant Orders   Comprehensive metabolic panel    Other Visit Diagnoses    Type 2 diabetes mellitus with hyperglycemia, with long-term current use of insulin ([place])    - Primary  -    Continue to work on diet, a1c is improved   Relevant Orders   POCT glycosylated hemoglobin (Hb A1C)   Comprehensive metabolic panel   Microalbumin, urine   EKG 12-Lead   Diabetic eye exam ([place])    - will get records from [place].   Intermittent chest pain    - EKG reassuring   Foul smelling urine    - no UTI   Relevant Orders   POCT urinalysis dipstick   Right hip pain    -  Will refer to Orthopedics for evaluation. She may need a trigger point injection.  Would like her treated with localized treatment over systemic NSAIDs      No orders of the defined types were placed in this encounter.   Follow-up: No follow-ups on file.    Forrest Moron, MD

## 2019-01-12 NOTE — Patient Instructions (Signed)
° ° ° °  If you have lab work done today you will be contacted with your lab results within the next 2 weeks.  If you have not heard from us then please contact us. The fastest way to get your results is to register for My Chart. ° ° °IF you received an x-ray today, you will receive an invoice from [facility]. Please contact [facility] at [phone] with questions or concerns regarding your invoice.  ° °IF you received labwork today, you will receive an invoice from [facility]. Please contact [facility] at [phone] with questions or concerns regarding your invoice.  ° °Our billing staff will not be able to assist you with questions regarding bills from these companies. ° °You will be contacted with the lab results as soon as they are available. The fastest way to get your results is to activate your My Chart account. Instructions are located on the last page of this paperwork. If you have not heard from us regarding the results in 2 weeks, please contact this office. °  ° ° ° °

## 2019-01-13 LAB — COMPREHENSIVE METABOLIC PANEL
ALT: 11 IU/L (ref 0–32)
AST: 14 IU/L (ref 0–40)
Albumin/Globulin Ratio: 1.7 (ref 1.2–2.2)
Albumin: 4.2 g/dL (ref 3.7–4.7)
Alkaline Phosphatase: 142 IU/L — ABNORMAL HIGH (ref 39–117)
BUN/Creatinine Ratio: 17 (ref 12–28)
BUN: 14 mg/dL (ref 8–27)
Bilirubin Total: 0.2 mg/dL (ref 0.0–1.2)
CO2: 21 mmol/L (ref 20–29)
Calcium: 9.6 mg/dL (ref 8.7–10.3)
Chloride: 102 mmol/L (ref 96–106)
Creatinine, Ser: 0.84 mg/dL (ref 0.57–1.00)
GFR calc Af Amer: 79 mL/min/{1.73_m2} (ref >59)
GFR calc non Af Amer: 69 mL/min/{1.73_m2} (ref >59)
Globulin, Total: 2.5 g/dL (ref 1.5–4.5)
Glucose: 204 mg/dL — ABNORMAL HIGH (ref 65–99)
Potassium: 4.2 mmol/L (ref 3.5–5.2)
Sodium: 140 mmol/L (ref 134–144)
Total Protein: 6.7 g/dL (ref 6.0–8.5)

## 2019-01-13 LAB — MICROALBUMIN, URINE: Microalbumin, Urine: 780.4 ug/mL

## 2019-01-27 ENCOUNTER — Encounter: Payer: Self-pay | Admitting: Orthopaedic Surgery

## 2019-01-27 ENCOUNTER — Ambulatory Visit (INDEPENDENT_AMBULATORY_CARE_PROVIDER_SITE_OTHER): Payer: Medicare Other | Admitting: Orthopaedic Surgery

## 2019-01-27 ENCOUNTER — Other Ambulatory Visit: Payer: Self-pay

## 2019-01-27 DIAGNOSIS — M545 Low back pain: Secondary | ICD-10-CM

## 2019-01-27 DIAGNOSIS — G8929 Other chronic pain: Secondary | ICD-10-CM

## 2019-01-27 MED ORDER — METHYLPREDNISOLONE 4 MG PO TBPK: ORAL_TABLET | ORAL | 0 refills | Status: DC

## 2019-01-27 MED ORDER — METHOCARBAMOL 500 MG PO TABS: 500.0000 mg | ORAL_TABLET | Freq: Two times a day (BID) | ORAL | 0 refills | Status: DC | PRN

## 2019-01-27 NOTE — Progress Notes (Signed)
Office Visit Note   Patient: [name]           Date of Birth: 05-Mar-1945           MRN: [id number] Visit Date: 01/27/2019              Requested by: Forrest Moron, MD [place],  [place] [id number] PCP: Forrest Moron, MD   Assessment & Plan: Visit Diagnoses:  1. Chronic right-sided low back pain without sciatica     Plan: Impression is chronic right sided lower back pain.  I believe she may be getting arthritic symptoms from above her fusion.  I will start her on a low-dose steroid pack and muscle relaxer.  She will follow-up with [place] as needed.  Follow-Up Instructions: Return if symptoms worsen or fail to improve.   Orders:  No orders of the defined types were placed in this encounter.  Meds ordered this encounter  Medications  . methocarbamol (ROBAXIN) 500 MG tablet    Sig: Take 1 tablet (500 mg total) by mouth 2 (two) times daily as needed.    Dispense:  20 tablet    Refill:  0  . methylPREDNISolone (MEDROL DOSEPAK) 4 MG TBPK tablet    Sig: Take as directed    Dispense:  21 tablet    Refill:  0      Procedures: No procedures performed   Clinical Data: No additional findings.   Subjective: Chief Complaint  Patient presents with  . Right Hip - Pain    HPI patient is a pleasant 74 year old female who comes in today with right lower back and buttocks pain.  This began approximately 3 weeks ago.  No known injury or change in activity.  She notes that the pain actually begins in the right buttocks and occasionally radiates up the right side of her lumbar spine.  This is an intermittent pulling sensation occasionally worse with walking.  She has tried ibuprofen with moderate relief of symptoms.  She denies any pain to the groin, anterior thigh or down the leg.  No numbness, tingling or burning.  No bowel or bladder change and no saddle paresthesias.  Of note she is status post lumbar fusion L4-5 and 2008 by Dr. Ellene Route.  Review of Systems as  detailed in HPI.  All others reviewed and are negative.   Objective: Vital Signs: There were no vitals taken for this visit.  Physical Exam well-developed well-nourished female no acute distress.  Alert and oriented x3.  Ortho Exam examination of the lumbar spine reveals no spinous tenderness.  She does have mild paraspinous tenderness on the right.  No pain with lumbar flexion.  She has mild pain with lumbar extension.  No pain with lumbar rotation.  Negative straight leg raise and negative logroll.  No focal weakness.  She is neurovascular intact distally.  Specialty Comments:  No specialty comments available.  Imaging: X-rays reviewed by me in [place] show previous stable fusion L4-5.  She has some mild degenerative changes and spurring at L2-3 and L3-4.  She also has abnormal straightening of the lumbar spine.   PMFS History: Patient Active Problem List   Diagnosis Date Noted  . Cyst of right ovary 07/28/2017  . Pure hypercholesterolemia 09/10/2016  . Thyroid nodule 09/10/2016  . COPD (chronic obstructive pulmonary disease) ([place]) 09/10/2016  . Family history of colon cancer in mother 06/06/2016  . Nuclear sclerotic cataract of both eyes 04/08/2013  . Type 2 diabetes mellitus  without complication, with long-term current use of insulin ([place]) 09/13/2011  . HTN (hypertension) 09/13/2011  . Tobacco user 09/13/2011  . DDD (degenerative disc disease), lumbar 09/13/2011  . History of rectal polypectomy 08/31/2010   Past Medical History:  Diagnosis Date  . COPD (chronic obstructive pulmonary disease) ([place])   . Diabetes mellitus   . Hyperlipidemia   . Hypertension   . Night sweats   . Rectal polyp     Family History  Problem Relation Age of Onset  . Diabetes Sister   . Diabetes Brother   . Hypertension Brother   . Cancer Mother 107       colon(age 15) and lung (age 80)  . Diabetes Mother   . Heart disease Mother 50       CABG  . Hyperlipidemia Mother   . Cancer Father 28        stomach  . Diabetes Maternal Grandmother   . Mental illness Sister     Past Surgical History:  Procedure Laterality Date  . ABDOMINAL HYSTERECTOMY  2000   fibroids; ovaries intact.  Marland Kitchen BACK SURGERY     plate put in back  . CHOLECYSTECTOMY N/A 05/01/2017   Procedure: LAPAROSCOPIC CHOLECYSTECTOMY WITH INTRAOPERATIVE CHOLANGIOGRAM;  Surgeon: Donnie Mesa, MD;  Location: [place];  Service: General;  Laterality: N/A;  . 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 TUBAL LIGATION     Social History   Occupational History  . Occupation: retired    Comment: [employer] Use  . Smoking status: Current Every Day Smoker    Packs/day: 0.50    Years: 50.00    Pack years: 25.00    Types: Cigarettes  . Smokeless tobacco: Never Used  Substance and Sexual Activity  . Alcohol use: No  . Drug use: No  . Sexual activity: Not on file

## 2019-02-03 ENCOUNTER — Telehealth: Payer: Self-pay | Admitting: Family Medicine

## 2019-02-03 NOTE — Telephone Encounter (Signed)
I have the form with me and will give to dr Nolon Rod on 02/06/2019 when she returns to the office.

## 2019-02-03 NOTE — Telephone Encounter (Signed)
Pt brought in here self monitoring sheet for the month. She would like Dr. Nolon Rod to fill out and  sign and fax to [phone]. I have placed at nurse's station.

## 2019-02-05 ENCOUNTER — Other Ambulatory Visit: Payer: Self-pay | Admitting: Family Medicine

## 2019-02-05 NOTE — Telephone Encounter (Signed)
Forwarding medication refill request to the clinical pool for review. 

## 2019-02-06 ENCOUNTER — Other Ambulatory Visit: Payer: Self-pay | Admitting: Family Medicine

## 2019-02-10 ENCOUNTER — Other Ambulatory Visit: Payer: Self-pay

## 2019-02-10 ENCOUNTER — Ambulatory Visit (INDEPENDENT_AMBULATORY_CARE_PROVIDER_SITE_OTHER): Payer: Medicare Other | Admitting: Adult Health Nurse Practitioner

## 2019-02-10 ENCOUNTER — Encounter: Payer: Self-pay | Admitting: Adult Health Nurse Practitioner

## 2019-02-10 VITALS — BP 134/74 | HR 87 | Temp 97.8°F | Ht 67.0 in | Wt 177.8 lb

## 2019-02-10 DIAGNOSIS — M62838 Other muscle spasm: Secondary | ICD-10-CM | POA: Diagnosis not present

## 2019-02-10 MED ORDER — KETOROLAC TROMETHAMINE 60 MG/2ML IM SOLN
60.0000 mg | Freq: Once | INTRAMUSCULAR | Status: AC
  Administered 2019-02-10: 15:00:00 60 mg via INTRAMUSCULAR

## 2019-02-10 MED ORDER — IBUPROFEN 800 MG PO TABS: 800.0000 mg | ORAL_TABLET | Freq: Three times a day (TID) | ORAL | 0 refills | Status: DC | PRN

## 2019-02-10 NOTE — Progress Notes (Signed)
Musculoskeletal ROS: positive for - muscle pain, pain in posterior trapezius and swelling in neck - right, upper, focal to trapezius with some radiation into SCMs and right shoulder negative for - gait disturbance or muscular weakness Musculoskeletal exam: Som pain with active and passive ROM but not limited. Strength is 5/5.  Exquisitely tender trap to touch.   SUBJECTIVE: [name] is a 74 y.o. female who developed worsening, progressive pain to her right shoulder and upper back starting 2 days ago.  She points to the area adjacent to the shoulder, posteriorly, in the trapezius area.  Recently saw Ortho for her hip and was given a Medrol Dosepack and Robaxin neither of which she has taken.   Pain is sharp, stabbing, constant, and unrelenting.  Patient has not been stretching it due to pain.  Denies any loss of ROM or strength despite pain.    Musculoskeletal ROS: positive for - muscle pain, pain in posterior trapezius and swelling in neck - right, upper, focal to trapezius with some radiation into SCMs and right shoulder negative for - gait disturbance or muscular weakness    OBJECTIVE: Vital signs as noted above. Appearance: in mild to moderate distress. Shoulder exam: normal exam, no swelling, tenderness, instability; ligaments intact, FROM shoulder joint. Musculoskeletal exam: Som pain with active and passive ROM but not limited. Strength is 5/5.  Exquisitely tender trap to touch.  . X-ray: not indicated.  ASSESSMENT: Trapezius Muscle Spasm.   We discussed the meds she was already prescribed in detail.  Prednisone will elevate her blood sugars but may help the pain from the spasm.  I am sending in Ibuprofen 800g.  She may take that or the Prednisone for 3 days but not both together.  Reviewed Robaxin.  Patient to try at home prior to driving to see if it makes her too groggy.  Monitor blood sugars.  Heat, stretching, gentle ROM to neck.  Increase fluids/hydration   PLAN: rest  the injured area as much as practical, apply ice packs, apply heat (warned not to sleep on heating pad), prescription for NSAID given See orders for this visit as documented in the electronic medical record.  Meds ordered this encounter  Medications  . ketorolac (TORADOL) injection 60 mg  . ibuprofen (ADVIL) 800 MG tablet    Sig: Take 1 tablet (800 mg total) by mouth every 8 (eight) hours as needed.    Dispense:  30 tablet    Refill:  0

## 2019-02-10 NOTE — Patient Instructions (Addendum)
If you have lab work done today you will be contacted with your lab results within the next 2 weeks.  If you have not heard from [place] then please contact us. The fastest way to get your results is to register for My Chart.   IF you received an x-ray today, you will receive an invoice from [facility]. Please contact [facility]iology at [phone] with questions or concerns regarding your invoice.   IF you received labwork today, you will receive an invoice from [place]. Please contact [facility] at [phone] with questions or concerns regarding your invoice.   Our billing staff will not be able to assist you with questions regarding bills from these companies.  You will be contacted with the lab results as soon as they are available. The fastest way to get your results is to activate your My Chart account. Instructions are located on the last page of this paperwork. If you have not heard from [place] regarding the results in 2 weeks, please contact this office.     Muscle Cramps and Spasms Muscle cramps and spasms occur when a muscle or muscles tighten and you have no control over this tightening (involuntary muscle contraction). They are a common problem and can develop in any muscle. The most common place is in the calf muscles of the leg. Muscle cramps and muscle spasms are both involuntary muscle contractions, but there are some differences between the two:  Muscle cramps are painful. They come and go and may last for a few seconds or up to 15 minutes. Muscle cramps are often more forceful and last longer than muscle spasms.  Muscle spasms may or may not be painful. They may also last just a few seconds or much longer. Certain medical conditions, such as diabetes or Parkinson's disease, can make it more likely to develop cramps or spasms. However, cramps or spasms are usually not caused by a serious underlying problem. Common causes include:  Doing more physical work or  exercise than your body is ready for (overexertion).  Overuse from repeating certain movements too many times.  Remaining in a certain position for a long period of time.  Improper preparation, form, or technique while playing a sport or doing an activity.  Dehydration.  Injury.  Side effects of some medicines.  Abnormally low levels of the salts and minerals in your blood (electrolytes), especially potassium and calcium. This could happen if you are taking water pills (diuretics) or if you are pregnant. In many cases, the cause of muscle cramps or spasms is not known. Follow these instructions at home: Managing pain and stiffness      Try massaging, stretching, and relaxing the affected muscle. Do this for several minutes at a time.  If directed, apply heat to tight or tense muscles as often as told by your health care provider. Use the heat source that your health care provider recommends, such as a moist heat pack or a heating pad. ? Place a towel between your skin and the heat source. ? Leave the heat on for 20-30 minutes. ? Remove the heat if your skin turns bright red. This is especially important if you are unable to feel pain, heat, or cold. You may have a greater risk of getting burned.  If directed, put ice on the affected area. This may help if you are sore or have pain after a cramp or spasm. ? Put ice in a plastic bag. ? Place a towel between your  skin and the bag. ? Leavethe ice on for 20 minutes, 2-3 times a day.  Try taking hot showers or baths to help relax tight muscles. Eating and drinking  Drink enough fluid to keep your urine pale yellow. Staying well hydrated may help prevent cramps or spasms.  Eat a healthy diet that includes plenty of nutrients to help your muscles function. A healthy diet includes fruits and vegetables, lean protein, whole grains, and low-fat or nonfat dairy products. General instructions  If you are having frequent cramps, avoid  intense exercise for several days.  Take over-the-counter and prescription medicines only as told by your health care provider.  Pay attention to any changes in your symptoms.  Keep all follow-up visits as told by your health care provider. This is important. Contact a health care provider if:  Your cramps or spasms get more severe or happen more often.  Your cramps or spasms do not improve over time. Summary  Muscle cramps and spasms occur when a muscle or muscles tighten and you have no control over this tightening (involuntary muscle contraction).  The most common place for cramps or spasms to occur is in the calf muscles of the leg.  Massaging, stretching, and relaxing the affected muscle may relieve the cramp or spasm.  Drink enough fluid to keep your urine pale yellow. Staying well hydrated may help prevent cramps or spasms. This information is not intended to replace advice given to you by your health care provider. Make sure you discuss any questions you have with your health care provider. Document Revised: 05/27/2017 Document Reviewed: 05/27/2017 Elsevier Patient Education  [place].

## 2019-02-23 ENCOUNTER — Other Ambulatory Visit: Payer: Self-pay | Admitting: Family Medicine

## 2019-02-23 ENCOUNTER — Other Ambulatory Visit: Payer: Self-pay

## 2019-02-23 ENCOUNTER — Ambulatory Visit (INDEPENDENT_AMBULATORY_CARE_PROVIDER_SITE_OTHER): Payer: Medicare Other

## 2019-02-23 ENCOUNTER — Encounter: Payer: Self-pay | Admitting: Family Medicine

## 2019-02-23 ENCOUNTER — Ambulatory Visit (INDEPENDENT_AMBULATORY_CARE_PROVIDER_SITE_OTHER): Payer: Medicare Other | Admitting: Family Medicine

## 2019-02-23 VITALS — BP 160/70 | HR 90 | Temp 97.6°F | Ht 67.0 in | Wt 176.6 lb

## 2019-02-23 DIAGNOSIS — M25511 Pain in right shoulder: Secondary | ICD-10-CM

## 2019-02-23 NOTE — Patient Instructions (Addendum)
   CLINICAL DATA:  Right shoulder pain  EXAM: RIGHT SHOULDER - 2+ VIEW  COMPARISON:  10/04/2008  FINDINGS: Degenerative changes in the [facility] joint with joint space narrowing and spurring. Glenohumeral joint is maintained. No acute bony abnormality. Specifically, no fracture, subluxation, or dislocation. Soft tissues are intact.  IMPRESSION: Mild degenerative changes in the right AC joint. No acute bony abnormality.   Electronically Signed   By: Rolm Baptise M.D.   On: 02/23/2019 15:07  If you have lab work done today you will be contacted with your lab results within the next 2 weeks.  If you have not heard from [place] then please contact us. The fastest way to get your results is to register for My Chart.   IF you received an x-ray today, you will receive an invoice from [facility] Radiology. Please contact [facility]iology at [phone] with questions or concerns regarding your invoice.   IF you received labwork today, you will receive an invoice from [place]. Please contact [facility] at [phone] with questions or concerns regarding your invoice.   Our billing staff will not be able to assist you with questions regarding bills from these companies.  You will be contacted with the lab results as soon as they are available. The fastest way to get your results is to activate your My Chart account. Instructions are located on the last page of this paperwork. If you have not heard from [place] regarding the results in 2 weeks, please contact this office.

## 2019-02-23 NOTE — Progress Notes (Signed)
Established Patient Office Visit  Subjective:  Patient ID: [name], female    DOB: 1945-11-13  Age: 74 y.o. MRN: [id number]  CC:  Chief Complaint  Patient presents with  . Shoulder Pain    rt side for 2 weeks f/u appt     HPI [name] presents for   Continued right shoulder pain She states that she has pain that is very painful She can raise her hand about 90 degrees  The most painful movement is with coughing She reports that her heart might stop beating when the pain comes   Past Medical History:  Diagnosis Date  . COPD (chronic obstructive pulmonary disease) ([place])   . Diabetes mellitus   . Hyperlipidemia   . Hypertension   . Night sweats   . Rectal polyp     Past Surgical History:  Procedure Laterality Date  . ABDOMINAL HYSTERECTOMY  2000   fibroids; ovaries intact.  Marland Kitchen BACK SURGERY     plate put in back  . CHOLECYSTECTOMY N/A 05/01/2017   Procedure: LAPAROSCOPIC CHOLECYSTECTOMY WITH INTRAOPERATIVE CHOLANGIOGRAM;  Surgeon: Donnie Mesa, MD;  Location: [place];  Service: General;  Laterality: N/A;  . 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 TUBAL LIGATION      Family History  Problem Relation Age of Onset  . Diabetes Sister   . Diabetes Brother   . Hypertension Brother   . Cancer Mother 69       colon(age 55) and lung (age 19)  . Diabetes Mother   . Heart disease Mother 21       CABG  . Hyperlipidemia Mother   . Cancer Father 71       stomach  . Diabetes Maternal Grandmother   . Mental illness Sister     Social History   Socioeconomic History  . Marital status: Divorced    Spouse name: Not on file  . Number of children: 3  . Years of education: Not on file  . Highest education level: Not on file  Occupational History  . Occupation: retired    Comment: [employer] Use  . Smoking status: Current Every Day Smoker    Packs/day: 0.50    Years: 50.00    Pack years: 25.00    Types: Cigarettes  .  Smokeless tobacco: Never Used  Substance and Sexual Activity  . Alcohol use: No  . Drug use: No  . Sexual activity: Not on file  Other Topics Concern  . Not on file  Social History Narrative   Marital status: divorced; not dating in 2019 but interested slightly.        Children:  3 children; 7 seven grandchildren; 1 gg      Lives: with oldest daughter, 2 grandchildren, 19 gg, 1 friend of daughters, daughter's boyfriend.       Employment: retired [employer] age 39.      Tobacco; 1ppd x 50 years.  Not interested in 2019.      Alcohol: none      Exercise: none in 2019. [place]      ADLs:   independent with ADLs; no assistant devices      Advanced Directives:  None; DNR/DNI; no HCPOA.    Social Determinants of Health   Financial Resource Strain:   . Difficulty of Paying Living Expenses: Not on file  Food Insecurity:   . Worried About Charity fundraiser in the  Last Year: Not on file  . Ran Out of Food in the Last Year: Not on file  Transportation Needs:   . Lack of Transportation (Medical): Not on file  . Lack of Transportation (Non-Medical): Not on file  Physical Activity:   . Days of Exercise per Week: Not on file  . Minutes of Exercise per Session: Not on file  Stress:   . Feeling of Stress : Not on file  Social Connections:   . Frequency of Communication with Friends and Family: Not on file  . Frequency of Social Gatherings with Friends and Family: Not on file  . Attends Religious Services: Not on file  . Active Member of Clubs or Organizations: Not on file  . Attends Archivist Meetings: Not on file  . Marital Status: Not on file  Intimate Partner Violence:   . Fear of Current or Ex-Partner: Not on file  . Emotionally Abused: Not on file  . Physically Abused: Not on file  . Sexually Abused: Not on file    Outpatient Medications Prior to Visit  Medication Sig Dispense Refill  . aspirin 81 MG tablet Take 81 mg by mouth daily.      .  blood glucose meter kit and supplies KIT Dispense based on patient and insurance preference. Use up to four times daily as directed. (FOR ICD-9 250.00, 250.01). 1 each 0  . Cholecalciferol (VITAMIN D3) 1000 UNITS CAPS Take 2 capsules by mouth every morning.     Marland Kitchen ibuprofen (ADVIL) 800 MG tablet Take 1 tablet (800 mg total) by mouth every 8 (eight) hours as needed. 30 tablet 0  . Insulin Pen Needle (BD PEN NEEDLE NANO U/F) 32G X 4 MM MISC USE AS DIRECTED 90 each 1  . LANTUS SOLOSTAR 100 UNIT/ML Solostar Pen INJECT SUBCUTANEOUSLY 55  UNITS DAILY AT 10PM 45 mL 3  . lisinopril (ZESTRIL) 20 MG tablet Take 1 tablet (20 mg total) by mouth daily. 90 tablet 2  . metFORMIN (GLUCOPHAGE) 1000 MG tablet TAKE 1 TABLET BY MOUTH  TWICE DAILY WITH A MEAL 180 tablet 1  . metoprolol succinate (TOPROL-XL) 25 MG 24 hr tablet TAKE 1 TABLET BY MOUTH  DAILY 90 tablet 3  . pravastatin (PRAVACHOL) 40 MG tablet Take 1 tablet by mouth  every evening after a meal 90 tablet 0  . fluticasone furoate-vilanterol (BREO ELLIPTA) 100-25 MCG/INH AEPB Inhale 1 puff into the lungs daily. 90 each 3  . tiotropium (SPIRIVA) 18 MCG inhalation capsule Place 1 capsule (18 mcg total) into inhaler and inhale daily. 90 capsule 3  . methocarbamol (ROBAXIN) 500 MG tablet Take 1 tablet (500 mg total) by mouth 2 (two) times daily as needed. (Patient not taking: Reported on 02/10/2019) 20 tablet 0  . methylPREDNISolone (MEDROL DOSEPAK) 4 MG TBPK tablet Take as directed (Patient not taking: Reported on 02/10/2019) 21 tablet 0  . nystatin cream (MYCOSTATIN) Apply 1 application topically 3 (three) times daily. (Patient not taking: Reported on 02/10/2019) 30 g 1   No facility-administered medications prior to visit.    Allergies  Allergen Reactions  . Colestipol Diarrhea    Diarrhea, gas, stomach cramps     ROS Review of Systems Review of Systems  Constitutional: Negative for activity change, appetite change, chills and fever.  HENT: Negative for  congestion, nosebleeds, trouble swallowing and voice change.   Respiratory: Negative for cough, shortness of breath and wheezing.   Gastrointestinal: Negative for diarrhea, nausea and vomiting.  Genitourinary: Negative for difficulty  urinating, dysuria, flank pain and hematuria.  Musculoskeletal: see hpi Neurological: Negative for dizziness, speech difficulty, light-headedness and numbness.  See HPI. All other review of systems negative.     Objective:     BP (!) 160/70   Pulse 90   Temp 97.6 F (36.4 C) (Temporal)   Ht '5\' 7"'$  (1.702 m)   Wt 176 lb 9.6 oz (80.1 kg)   SpO2 95%   BMI 27.66 kg/m  Wt Readings from Last 3 Encounters:  02/23/19 176 lb 9.6 oz (80.1 kg)  02/10/19 177 lb 12.8 oz (80.6 kg)  01/12/19 178 lb (80.7 kg)   Physical Exam  Physical Exam  Constitutional: Oriented to person, place, and time. Appears well-developed and well-nourished.  HENT:  Head: Normocephalic and atraumatic.  Eyes: Conjunctivae and EOM are normal.  Cardiovascular: Normal rate, regular rhythm, normal heart sounds and intact distal pulses.  No murmur heard. Pulmonary/Chest: Effort normal and breath sounds normal. No stridor. No respiratory distress. Has no wheezes.  Neurological: Is alert and oriented to person, place, and time.  Skin: Skin is warm. Capillary refill takes less than 2 seconds.  Psychiatric: Has a normal mood and affect. Behavior is normal. Judgment and thought content normal.   Right shoulder Anterior shoulder tender to palpation No winged scapula No crepitus Reduced range of motion  CLINICAL DATA:  Right shoulder pain  EXAM: RIGHT SHOULDER - 2+ VIEW  COMPARISON:  10/04/2008  FINDINGS: Degenerative changes in the [facility] joint with joint space narrowing and spurring. Glenohumeral joint is maintained. No acute bony abnormality. Specifically, no fracture, subluxation, or dislocation. Soft tissues are intact.  IMPRESSION: Mild degenerative changes in the right AC  joint. No acute bony abnormality.   Electronically Signed   By: Rolm Baptise M.D.   On: 02/23/2019 15:07   There are no preventive care reminders to display for this patient.  There are no preventive care reminders to display for this patient.  Lab Results  Component Value Date   TSH 0.940 07/24/2017   Lab Results  Component Value Date   WBC 9.2 07/24/2017   HGB 14.5 07/24/2017   HCT 46.4 07/24/2017   MCV 86 07/24/2017   PLT 358 07/24/2017   Lab Results  Component Value Date   NA 140 01/12/2019   K 4.2 01/12/2019   CO2 21 01/12/2019   GLUCOSE 204 (H) 01/12/2019   BUN 14 01/12/2019   CREATININE 0.84 01/12/2019   BILITOT 0.2 01/12/2019   ALKPHOS 142 (H) 01/12/2019   AST 14 01/12/2019   ALT 11 01/12/2019   PROT 6.7 01/12/2019   ALBUMIN 4.2 01/12/2019   CALCIUM 9.6 01/12/2019   ANIONGAP 13 04/17/2017   Lab Results  Component Value Date   CHOL 135 10/14/2018   Lab Results  Component Value Date   HDL 50 10/14/2018   Lab Results  Component Value Date   LDLCALC 68 10/14/2018   Lab Results  Component Value Date   TRIG 92 10/14/2018   Lab Results  Component Value Date   CHOLHDL 2.7 10/14/2018   Lab Results  Component Value Date   HGBA1C 8.5 (A) 01/12/2019      Assessment & Plan:   Problem List Items Addressed This Visit    None    Visit Diagnoses    Acute pain of right shoulder    -  Primary   Relevant Orders   DG Shoulder Right (Completed)    Follow up with Dr. Erlinda Hong Should avoid nsaids due to  hypertension     No orders of the defined types were placed in this encounter.   Follow-up: No follow-ups on file.    Forrest Moron, MD

## 2019-03-02 ENCOUNTER — Other Ambulatory Visit: Payer: Self-pay | Admitting: Family Medicine

## 2019-03-12 ENCOUNTER — Other Ambulatory Visit: Payer: Self-pay | Admitting: Family Medicine

## 2019-03-12 ENCOUNTER — Ambulatory Visit: Payer: Medicare Other | Attending: Internal Medicine

## 2019-03-12 DIAGNOSIS — Z23 Encounter for immunization: Secondary | ICD-10-CM

## 2019-03-12 NOTE — Telephone Encounter (Signed)
Requested medication (s) are due for refill today: yes  Requested medication (s) are on the active medication list: yes  Last refill:  11/11/18  Future visit scheduled: yes  Notes to clinic:  Cardiovascular: Antilipid- statins failed  Requested Prescriptions  Pending Prescriptions Disp Refills   pravastatin (PRAVACHOL) 40 MG tablet [Pharmacy Med Name: PRAVASTATIN SOD 40MG  TABLET] 90 tablet 3    Sig: TAKE 1 TABLET BY MOUTH IN  THE EVENING AFTER A MEAL      Cardiovascular:  Antilipid - Statins Failed - 03/12/2019  3:16 PM      Failed - LDL in normal range and within 360 days    LDL Chol Calc (NIH)  Date Value Ref Range Status  10/14/2018 68 0 - 99 mg/dL Final   Direct LDL  Date Value Ref Range Status  04/17/2012 85 mg/dL Final    Comment:    ATP III Classification (LDL):       < 100        mg/dL         Optimal      100 - 129     mg/dL         Near or Above Optimal      130 - 159     mg/dL         Borderline High      160 - 189     mg/dL         High       > 190        mg/dL         Very High            Passed - Total Cholesterol in normal range and within 360 days    Cholesterol, Total  Date Value Ref Range Status  10/14/2018 135 100 - 199 mg/dL Final          Passed - HDL in normal range and within 360 days    HDL  Date Value Ref Range Status  10/14/2018 50 >39 mg/dL Final          Passed - Triglycerides in normal range and within 360 days    Triglycerides  Date Value Ref Range Status  10/14/2018 92 0 - 149 mg/dL Final          Passed - Patient is not pregnant      Passed - Valid encounter within last 12 months    Recent Outpatient Visits           2 weeks ago Acute pain of right shoulder   Primary Care at [facility], Zoe A, MD   1 month ago Trapezius muscle spasm   Primary Care at [facility], Lorelee Market, NP   1 month ago Type 2 diabetes mellitus with hyperglycemia, with long-term current use of insulin ([place])   Primary Care at [facility], Arlie Solomons, MD   4 months ago Encounter for Medicare annual wellness exam   Primary Care at [place], MD   9 months ago Panlobular emphysema [facility])   Primary [facility] at [facility], Delfino Lovett, NP       Future Appointments             In 2 months Forrest Moron, MD Primary Care at [address], [facility]

## 2019-03-12 NOTE — Progress Notes (Signed)
   Covid-19 Vaccination Clinic  Name:  [name]    MRN: [id number] DOB: 05-31-1945  03/12/2019  [name] was observed post Covid-19 immunization for 15 minutes without incidence. She was provided with Vaccine Information Sheet and instruction to access the V-Safe system.   [name] was instructed to call 911 with any severe reactions post vaccine: Marland Kitchen Difficulty breathing  . Swelling of your face and throat  . A fast heartbeat  . A bad rash all over your body  . Dizziness and weakness    Immunizations Administered    Name Date Dose VIS Date Route   Pfizer COVID-19 Vaccine 03/12/2019 12:01 PM 0.3 mL 12/26/2018 Intramuscular   Manufacturer: [place]   Lot: [id number]   [place]: [id number]

## 2019-03-18 ENCOUNTER — Telehealth: Payer: Self-pay | Admitting: Family Medicine

## 2019-03-18 MED ORDER — BLOOD GLUCOSE METER KIT: PACK | 0 refills | Status: AC

## 2019-03-18 MED ORDER — BLOOD GLUCOSE METER KIT: PACK | 0 refills | Status: DC

## 2019-03-18 NOTE — Telephone Encounter (Signed)
Updated Blood sugar meter sent to pharmacy

## 2019-03-18 NOTE — Telephone Encounter (Signed)
Medication Refill - Medication: Pt needs an AccuCheck Meter and 200 test strips   Has the patient contacted their pharmacy? Yes.   (Agent: If no, request that the patient contact the pharmacy for the refill.) (Agent: If yes, when and what did the pharmacy advise?)  Preferred Pharmacy (with phone number or street name):  [facility] [address] [place] [address]  Phone: [phone] Fax: [phone]     Agent: Please be advised that RX refills may take up to 3 business days. We ask that you follow-up with your pharmacy.

## 2019-03-18 NOTE — Addendum Note (Signed)
Addended by: Kittie Plater, Sandra Brents HUA on: 03/18/2019 05:43 PM   Modules accepted: Orders

## 2019-03-19 NOTE — Telephone Encounter (Signed)
Pt called saying she got a call from [facility] saying her meter and strips.  The prescription was supposed to be sent to [facility].

## 2019-03-19 NOTE — Telephone Encounter (Signed)
The call from [facility] was to tell her they had the rx there.

## 2019-04-01 ENCOUNTER — Ambulatory Visit: Payer: Medicare Other | Attending: Internal Medicine

## 2019-04-01 DIAGNOSIS — Z23 Encounter for immunization: Secondary | ICD-10-CM

## 2019-04-01 NOTE — Progress Notes (Signed)
   Covid-19 Vaccination Clinic  Name:  [name]    MRN: [id number] DOB: October 27, 1945  04/01/2019  [name] was observed post Covid-19 immunization for 15 minutes without incident. She was provided with Vaccine Information Sheet and instruction to access the V-Safe system.   [name] was instructed to call 911 with any severe reactions post vaccine: Marland Kitchen Difficulty breathing  . Swelling of face and throat  . A fast heartbeat  . A bad rash all over body  . Dizziness and weakness   Immunizations Administered    Name Date Dose VIS Date Route   Pfizer COVID-19 Vaccine 04/01/2019  1:29 PM 0.3 mL 12/26/2018 Intramuscular   Manufacturer: [place]   Lot: [id number]   [place]: [id number]

## 2019-04-06 ENCOUNTER — Telehealth: Payer: Self-pay

## 2019-04-07 NOTE — Telephone Encounter (Signed)
Reached out to patient to schedule vv with pcp.

## 2019-04-26 IMAGING — US US ABDOMEN LIMITED
1 series · 14 of 25 positions shown · non-contrast
Comparison: CT abdomen and pelvis 04/18/2017

CLINICAL DATA: Cholelithiasis on CT.

EXAM:
ULTRASOUND ABDOMEN LIMITED RIGHT UPPER QUADRANT

[Series 1: us abdomen limited · 14 of 58 slices shown]
[im 1/58]
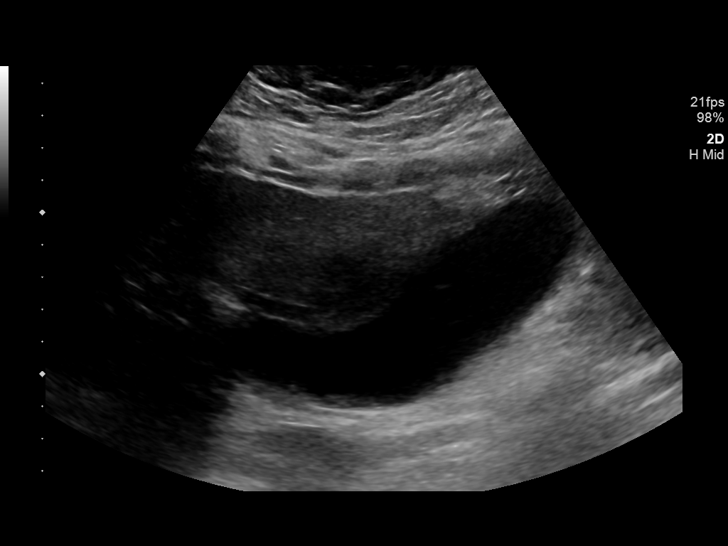
[im 5/58]
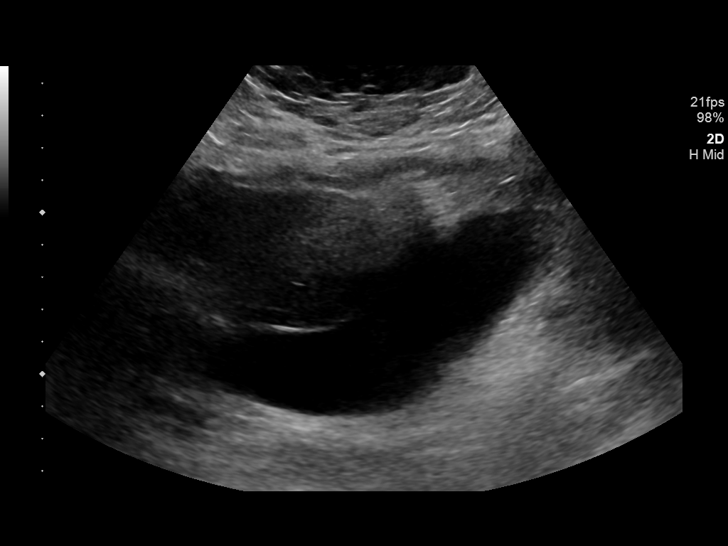
[im 10/58]
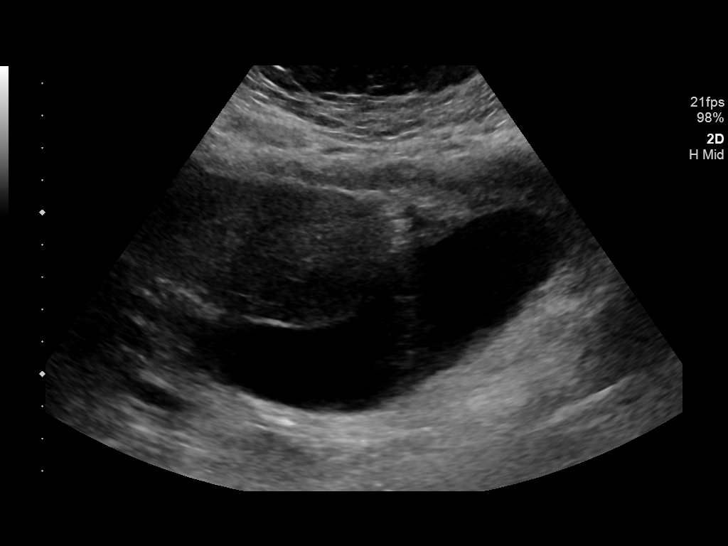
[im 15/58]
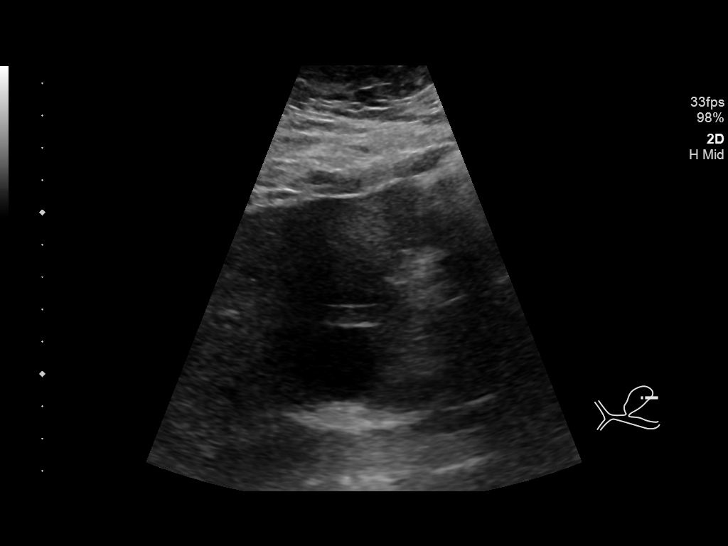
[im 20/58]
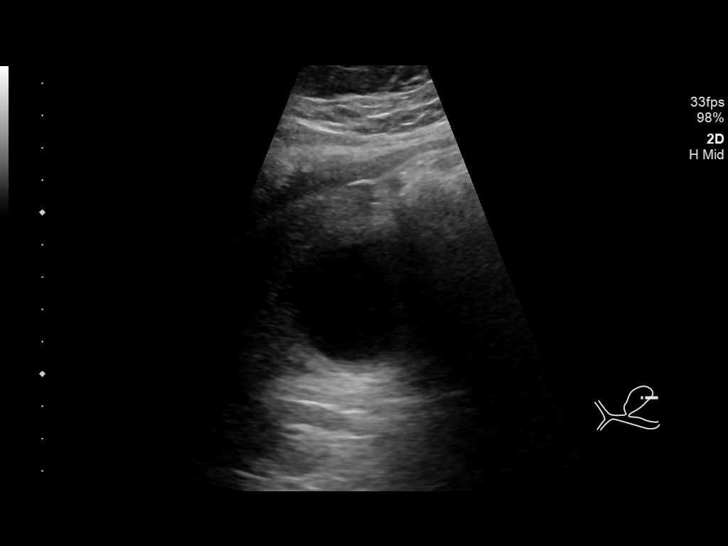
[im 22/58]
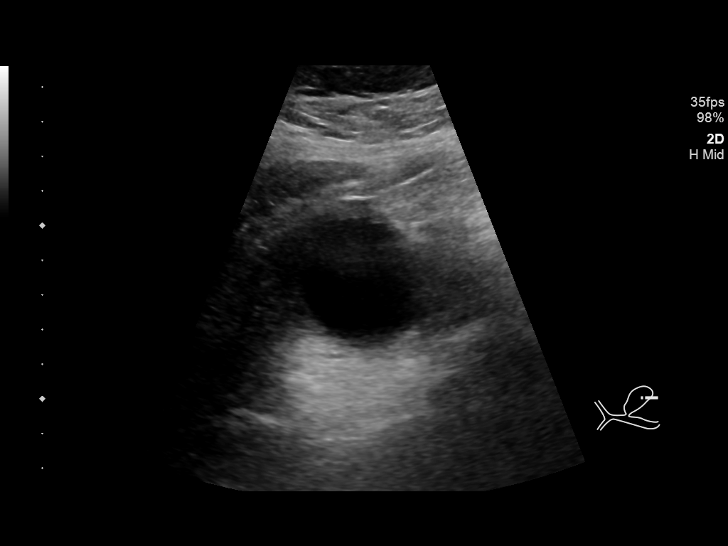
[im 27/58]
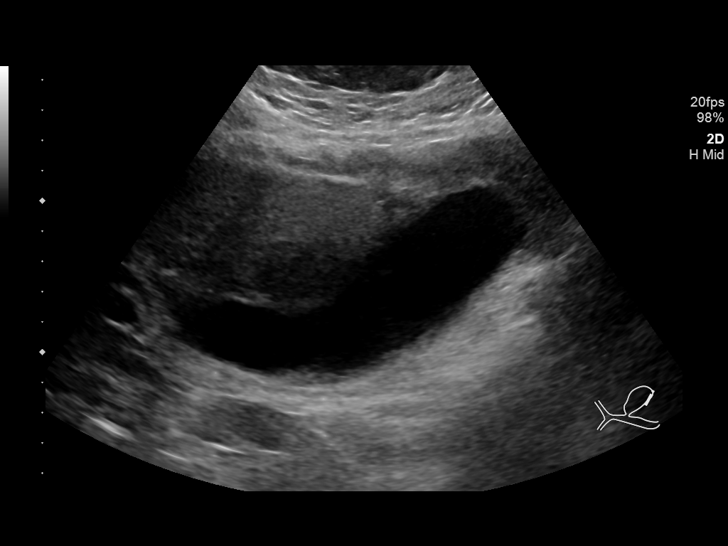
[im 31/58]
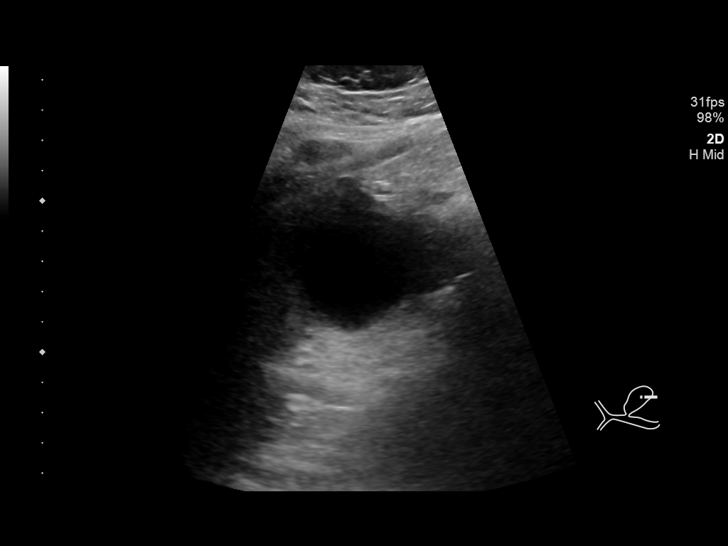
[im 36/58]
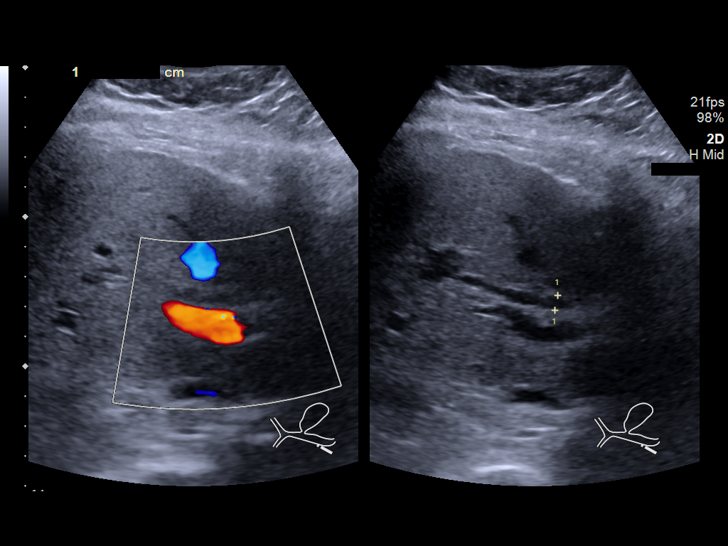
[im 39/58]
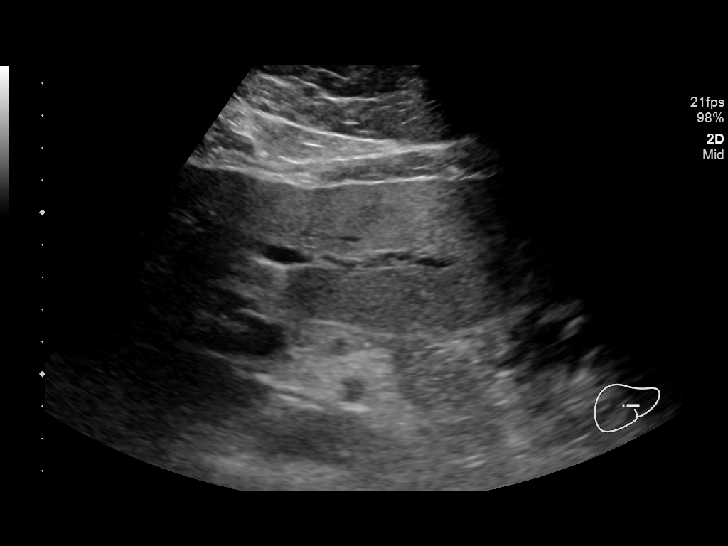
[im 43/58]
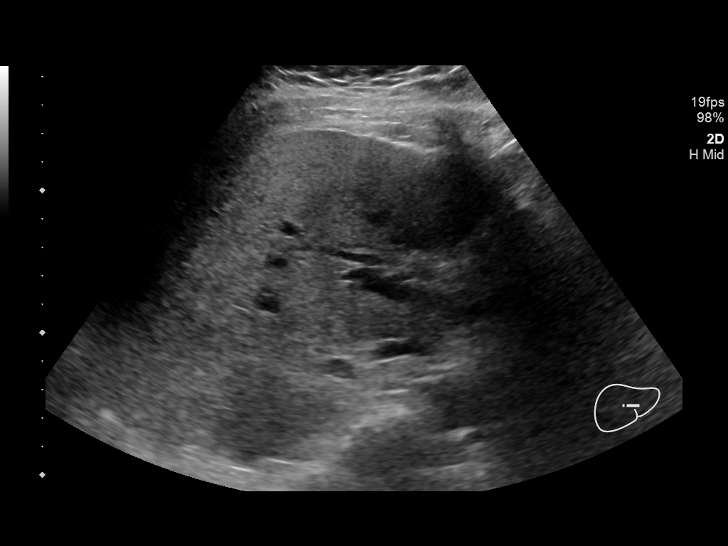
[im 48/58]
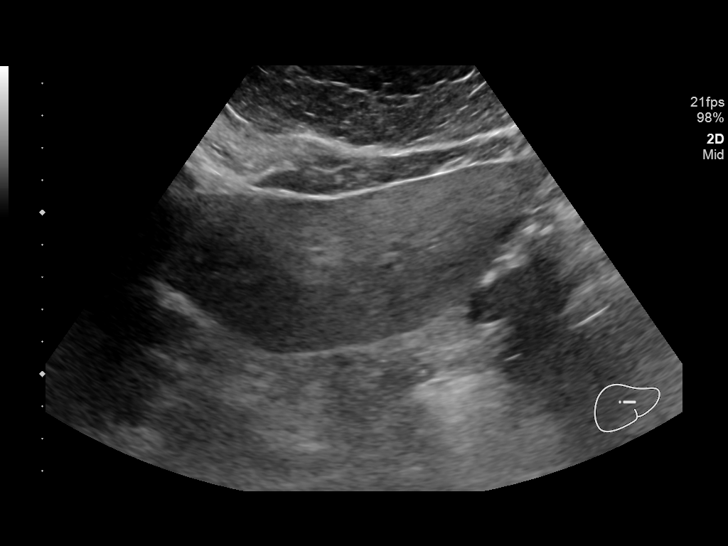
[im 53/58]
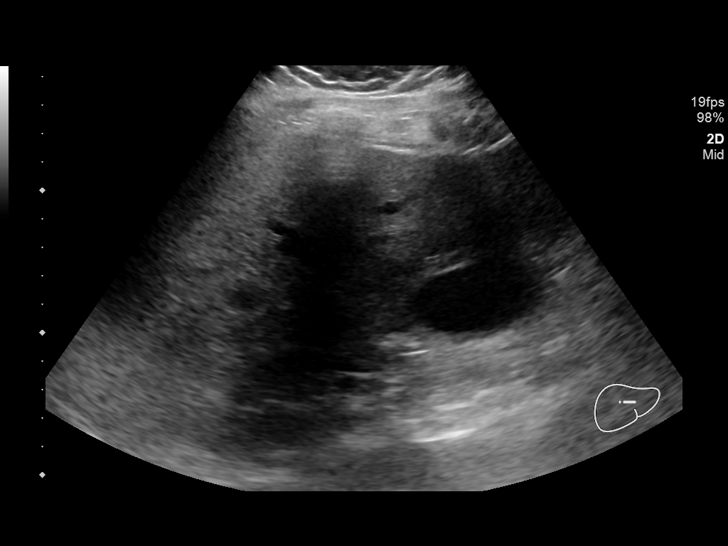
[im 58/58]
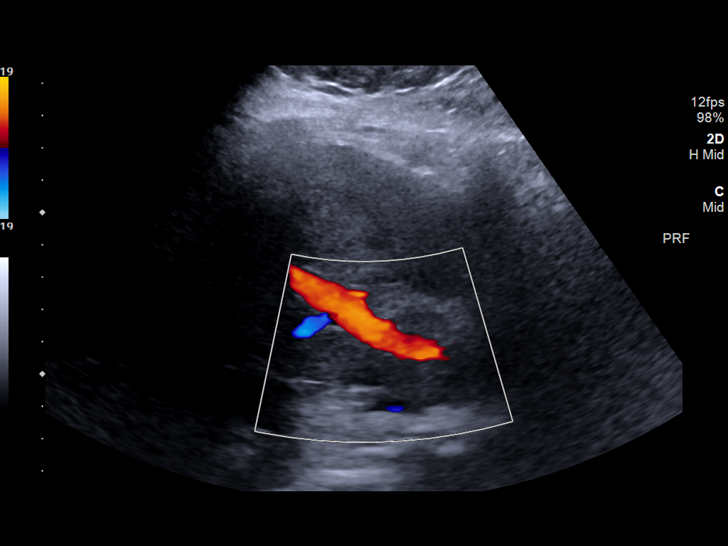

[14 of 25 positions shown; findings below may reference images not displayed]

FINDINGS: Gallbladder:

Gallbladder sludge and tiny stones layering in the gallbladder.
Gallbladder wall thickness measures about 2.4 mm. No wall thickening
or edema is identified. Murphy's sign is negative.

Common bile duct:

Diameter: 5.1 mm, normal

Liver:

Diffusely increased parenchymal echotexture consistent with fatty
infiltration. No focal lesions. Portal vein is patent on color
Doppler imaging with normal direction of blood flow towards the
liver.
IMPRESSION: Cholelithiasis and gallbladder sludge. No additional changes to
suggest cholecystitis. Diffuse fatty infiltration of the liver.

## 2019-05-02 ENCOUNTER — Other Ambulatory Visit: Payer: Self-pay | Admitting: Family Medicine

## 2019-05-13 ENCOUNTER — Encounter: Payer: Self-pay | Admitting: Family Medicine

## 2019-05-13 ENCOUNTER — Ambulatory Visit (INDEPENDENT_AMBULATORY_CARE_PROVIDER_SITE_OTHER): Payer: Medicare Other | Admitting: Family Medicine

## 2019-05-13 ENCOUNTER — Other Ambulatory Visit: Payer: Self-pay

## 2019-05-13 VITALS — BP 162/78 | HR 76 | Temp 97.2°F | Resp 17 | Ht 67.0 in | Wt 174.2 lb

## 2019-05-13 DIAGNOSIS — I10 Essential (primary) hypertension: Secondary | ICD-10-CM

## 2019-05-13 DIAGNOSIS — E1165 Type 2 diabetes mellitus with hyperglycemia: Secondary | ICD-10-CM | POA: Diagnosis not present

## 2019-05-13 DIAGNOSIS — E663 Overweight: Secondary | ICD-10-CM

## 2019-05-13 DIAGNOSIS — Z794 Long term (current) use of insulin: Secondary | ICD-10-CM | POA: Diagnosis not present

## 2019-05-13 LAB — COMPREHENSIVE METABOLIC PANEL
ALT: 10 IU/L (ref 0–32)
AST: 14 IU/L (ref 0–40)
Albumin/Globulin Ratio: 1.6 (ref 1.2–2.2)
Albumin: 4.1 g/dL (ref 3.7–4.7)
Alkaline Phosphatase: 135 IU/L — ABNORMAL HIGH (ref 39–117)
BUN/Creatinine Ratio: 11 — ABNORMAL LOW (ref 12–28)
BUN: 10 mg/dL (ref 8–27)
Bilirubin Total: <0.2 mg/dL (ref 0.0–1.2)
CO2: 20 mmol/L (ref 20–29)
Calcium: 9.8 mg/dL (ref 8.7–10.3)
Chloride: 102 mmol/L (ref 96–106)
Creatinine, Ser: 0.88 mg/dL (ref 0.57–1.00)
GFR calc Af Amer: 75 mL/min/{1.73_m2} (ref >59)
GFR calc non Af Amer: 65 mL/min/{1.73_m2} (ref >59)
Globulin, Total: 2.6 g/dL (ref 1.5–4.5)
Glucose: 194 mg/dL — ABNORMAL HIGH (ref 65–99)
Potassium: 4.6 mmol/L (ref 3.5–5.2)
Sodium: 138 mmol/L (ref 134–144)
Total Protein: 6.7 g/dL (ref 6.0–8.5)

## 2019-05-13 LAB — POCT GLYCOSYLATED HEMOGLOBIN (HGB A1C): Hemoglobin A1C: 7.9 % — AB (ref 4.0–5.6)

## 2019-05-13 MED ORDER — BD PEN NEEDLE NANO U/F 32G X 4 MM MISC: 1 refills | Status: DC

## 2019-05-13 MED ORDER — LISINOPRIL 20 MG PO TABS: 20.0000 mg | ORAL_TABLET | Freq: Every day | ORAL | 2 refills | Status: DC

## 2019-05-13 MED ORDER — METFORMIN HCL 1000 MG PO TABS: ORAL_TABLET | ORAL | 1 refills | Status: DC

## 2019-05-13 NOTE — Patient Instructions (Addendum)
  Dr. Nolon Rod will come to you on June 29 at your home at [facility].    If you have lab work done today you will be contacted with your lab results within the next 2 weeks.  If you have not heard from [place] then please contact us. The fastest way to get your results is to register for My Chart.   IF you received an x-ray today, you will receive an invoice from [facility] Radiology. Please contact [facility] Radiology at [phone] with questions or concerns regarding your invoice.   IF you received labwork today, you will receive an invoice from [place]. Please contact [facility] at [phone] with questions or concerns regarding your invoice.   Our billing staff will not be able to assist you with questions regarding bills from these companies.  You will be contacted with the lab results as soon as they are available. The fastest way to get your results is to activate your My Chart account. Instructions are located on the last page of this paperwork. If you have not heard from [place] regarding the results in 2 weeks, please contact this office.

## 2019-05-13 NOTE — Progress Notes (Signed)
Established Patient Office Visit  Subjective:  Patient ID: [name], female    DOB: 06-May-1945  Age: 74 y.o. MRN: [id number]  CC:  Chief Complaint  Patient presents with  . Diabetes    4 month f/u  . Medication Refill    pen needles, lisinopril and metformin    HPI [name] presents for   Diabetes Mellitus: Patient presents for follow up of diabetes. Symptoms: hyperglycemia and hypoglycemia. She states that her sugars were so-so.  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 Symptoms have stabilized. Patient denies nausea, paresthesia of the feet, polydipsia, polyuria and visual disturbances.  Evaluation to date has been included: hemoglobin A1C.  Treatment to date: Continued insulin which has been effective and Continued metformin which has been effective.  Lab Results  Component Value Date   HGBA1C 7.9 (A) 05/13/2019   Hypertension: Patient here for follow-up of elevated blood pressure. She is not exercising and is adherent to low salt diet.  Blood pressure is well controlled at home but today she had to drive and she states that it stressed her out. Cardiac symptoms none. Patient denies chest pain, claudication, dyspnea, fatigue and lower extremity edema.  Cardiovascular risk factors: advanced age (older than 53 for men, 57 for women), diabetes mellitus and hypertension. Use of agents associated with hypertension: none. History of target organ damage: none. BP Readings from Last 3 Encounters:  05/13/19 (!) 190/100  02/23/19 (!) 160/70  02/10/19 134/74    Overweight - she states that she was not able to eat all this food because of dental work so she lost weight by mostly drinking soups.  She is not exercising.  Body mass index is 27.28 kg/m. Wt Readings from Last 3 Encounters:  05/13/19 174 lb 3.2 oz (79 kg)  02/23/19 176 lb 9.6 oz (80.1 kg)  02/10/19 177 lb 12.8 oz (80.6 kg)      Past Medical History:  Diagnosis Date  . COPD (chronic obstructive pulmonary disease) ([place])   . Diabetes mellitus   . Hyperlipidemia   . Hypertension   . Night sweats   . Rectal polyp     Past Surgical History:  Procedure Laterality Date  . ABDOMINAL HYSTERECTOMY  2000   fibroids; ovaries intact.  Marland Kitchen BACK SURGERY     plate put in back  . CHOLECYSTECTOMY N/A 05/01/2017   Procedure: LAPAROSCOPIC CHOLECYSTECTOMY WITH INTRAOPERATIVE CHOLANGIOGRAM;  Surgeon: Donnie Mesa, MD;  Location: [facility];  Service: General;  Laterality: N/A;  . 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 TUBAL LIGATION      Family History  Problem Relation Age of Onset  . Diabetes Sister   . Diabetes Brother   . Hypertension Brother   . Cancer Mother 74       colon(age 70) and lung (age 52)  . Diabetes Mother   . Heart disease Mother 49       CABG  . Hyperlipidemia Mother   . Cancer Father 47       stomach  . Diabetes Maternal Grandmother   . Mental illness Sister     Social History   Socioeconomic History  . Marital status: Divorced    Spouse name: Not on file  . Number of children: 3  . Years of education: Not on file  . Highest education level: Not on file  Occupational History  .  Occupation: retired    Comment: [employer] Use  . Smoking status: Current Every Day Smoker    Packs/day: 0.50    Years: 50.00    Pack years: 25.00    Types: Cigarettes  . Smokeless tobacco: Never Used  Substance and Sexual Activity  . Alcohol use: No  . Drug use: No  . Sexual activity: Not on file  Other Topics Concern  . Not on file  Social History Narrative   Marital status: divorced; not dating in 2019 but interested slightly.        Children:  3 children; 7 seven grandchildren; 1 gg      Lives: with oldest daughter, 2 grandchildren, 47 gg, 1 friend of daughters, daughter's boyfriend.       Employment: retired [employer] [place] age 6.      Tobacco; 1ppd x 50 years.   Not interested in 2019.      Alcohol: none      Exercise: none in 2019. [place]      ADLs:   independent with ADLs; no assistant devices      Advanced Directives:  None; DNR/DNI; no HCPOA.    Social Determinants of Health   Financial Resource Strain:   . Difficulty of Paying Living Expenses:   Food Insecurity:   . Worried About Charity fundraiser in the Last Year:   . Arboriculturist in the Last Year:   Transportation Needs:   . Film/video editor (Medical):   Marland Kitchen Lack of Transportation (Non-Medical):   Physical Activity:   . Days of Exercise per Week:   . Minutes of Exercise per Session:   Stress:   . Feeling of Stress :   Social Connections:   . Frequency of Communication with Friends and Family:   . Frequency of Social Gatherings with Friends and Family:   . Attends Religious Services:   . Active Member of Clubs or Organizations:   . Attends Archivist Meetings:   Marland Kitchen Marital Status:   Intimate Partner Violence:   . Fear of Current or Ex-Partner:   . Emotionally Abused:   Marland Kitchen Physically Abused:   . Sexually Abused:     Outpatient Medications Prior to Visit  Medication Sig Dispense Refill  . ACCU-CHEK GUIDE test strip USE UP TO 4 TIMES DAILY AS  DIRECTED 200 strip 6  . aspirin 81 MG tablet Take 81 mg by mouth daily.      . blood glucose meter kit and supplies KIT Dispense based on patient and insurance preference. Use up to four times daily as directed. (FOR ICD-9 250.00, 250.01). 1 each 0  . blood glucose meter kit and supplies Dispense based on insurance preference. Use up to four times daily as directed. (FOR ICD-10 E11.9 E11.65) AccuCheck Meter    1 each 0  . BREO ELLIPTA 100-25 MCG/INH AEPB USE 1 INHALATION BY MOUTH  ONCE DAILY AT THE SAME TIME EACH DAY 180 each 3  . Cholecalciferol (VITAMIN D3) 1000 UNITS CAPS Take 2 capsules by mouth every morning.     Marland Kitchen ibuprofen (ADVIL) 800 MG tablet Take 1 tablet (800 mg total) by mouth every 8  (eight) hours as needed. 30 tablet 0  . Insulin Pen Needle (BD PEN NEEDLE NANO U/F) 32G X 4 MM MISC USE AS DIRECTED 90 each 1  . LANTUS SOLOSTAR 100 UNIT/ML Solostar Pen INJECT SUBCUTANEOUSLY 55  UNITS DAILY AT 10PM 45 mL  3  . lisinopril (ZESTRIL) 20 MG tablet Take 1 tablet (20 mg total) by mouth daily. 90 tablet 2  . metFORMIN (GLUCOPHAGE) 1000 MG tablet TAKE 1 TABLET BY MOUTH  TWICE DAILY WITH A MEAL 180 tablet 1  . metoprolol succinate (TOPROL-XL) 25 MG 24 hr tablet TAKE 1 TABLET BY MOUTH  DAILY 90 tablet 3  . pravastatin (PRAVACHOL) 40 MG tablet TAKE 1 TABLET BY MOUTH IN  THE EVENING AFTER A MEAL 90 tablet 3  . SPIRIVA HANDIHALER 18 MCG inhalation capsule INHALE THE CONTENTS OF 1  CAPSULE BY MOUTH VIA  HANDIHALER DAILY 90 capsule 3   No facility-administered medications prior to visit.    Allergies  Allergen Reactions  . Colestipol Diarrhea    Diarrhea, gas, stomach cramps     ROS Review of Systems Review of Systems  Constitutional: Negative for activity change, appetite change, chills and fever.  HENT: Negative for congestion, nosebleeds, trouble swallowing and voice change.   Respiratory: Negative for cough, shortness of breath and wheezing.   Gastrointestinal: Negative for diarrhea, nausea and vomiting.  Genitourinary: Negative for difficulty urinating, dysuria, flank pain and hematuria.  Musculoskeletal: Negative for back pain, joint swelling and neck pain.  Neurological: Negative for dizziness, speech difficulty, light-headedness and numbness.  See HPI. All other review of systems negative.     Objective:    Physical Exam  BP (!) 190/100 (BP Location: Right Arm, Patient Position: Sitting, Cuff Size: Normal)   Pulse 76   Temp (!) 97.2 F (36.2 C) (Temporal)   Resp 17   Ht _0  (1.702 m)   Wt 174 lb 3.2 oz (79 kg)   SpO2 97%   BMI 27.28 kg/m  Wt Readings from Last 3 Encounters:  05/13/19 174 lb 3.2 oz (79 kg)  02/23/19 176 lb 9.6 oz (80.1 kg)  02/10/19 177 lb  12.8 oz (80.6 kg)   Physical Exam  Constitutional: Oriented to person, place, and time. Appears well-developed and well-nourished.  HENT:  Head: Normocephalic and atraumatic.  Eyes: Conjunctivae and EOM are normal.  Cardiovascular: Normal rate, regular rhythm, normal heart sounds and intact distal pulses.  No murmur heard. Pulmonary/Chest: Effort normal and breath sounds normal. No stridor. No respiratory distress. Has no wheezes.  Neurological: Is alert and oriented to person, place, and time.  Skin: Skin is warm. Capillary refill takes less than 2 seconds.  Psychiatric: Has a normal mood and affect. Behavior is normal. Judgment and thought content normal.    There are no preventive care reminders to display for this patient.  There are no preventive care reminders to display for this patient.  Lab Results  Component Value Date   TSH 0.940 07/24/2017   Lab Results  Component Value Date   WBC 9.2 07/24/2017   HGB 14.5 07/24/2017   HCT 46.4 07/24/2017   MCV 86 07/24/2017   PLT 358 07/24/2017   Lab Results  Component Value Date   NA 140 01/12/2019   K 4.2 01/12/2019   CO2 21 01/12/2019   GLUCOSE 204 (H) 01/12/2019   BUN 14 01/12/2019   CREATININE 0.84 01/12/2019   BILITOT 0.2 01/12/2019   ALKPHOS 142 (H) 01/12/2019   AST 14 01/12/2019   ALT 11 01/12/2019   PROT 6.7 01/12/2019   ALBUMIN 4.2 01/12/2019   CALCIUM 9.6 01/12/2019   ANIONGAP 13 04/17/2017   Lab Results  Component Value Date   CHOL 135 10/14/2018   Lab Results  Component Value Date  HDL 50 10/14/2018   Lab Results  Component Value Date   LDLCALC 68 10/14/2018   Lab Results  Component Value Date   TRIG 92 10/14/2018   Lab Results  Component Value Date   CHOLHDL 2.7 10/14/2018   Lab Results  Component Value Date   HGBA1C 7.9 (A) 05/13/2019      Assessment & Plan:   Problem List Items Addressed This Visit      Cardiovascular and Mediastinum   HTN (hypertension)  -  bp elevated    Relevant Orders   Comprehensive metabolic panel    Other Visit Diagnoses    Type 2 diabetes mellitus with hyperglycemia, with long-term current use of insulin ([place])    -  Primary   Relevant Orders   POCT glycosylated hemoglobin (Hb A1C) (Completed)   Comprehensive metabolic panel   Overweight          No orders of the defined types were placed in this encounter.   Follow-up: No follow-ups on file.    Forrest Moron, MD

## 2019-05-27 ENCOUNTER — Other Ambulatory Visit: Payer: Self-pay | Admitting: Family Medicine

## 2019-05-27 DIAGNOSIS — Z1231 Encounter for screening mammogram for malignant neoplasm of breast: Secondary | ICD-10-CM

## 2019-06-04 ENCOUNTER — Other Ambulatory Visit: Payer: Self-pay | Admitting: Family Medicine

## 2019-06-16 ENCOUNTER — Other Ambulatory Visit: Payer: Self-pay | Admitting: Family Medicine

## 2019-07-01 ENCOUNTER — Other Ambulatory Visit: Payer: Self-pay

## 2019-07-01 ENCOUNTER — Ambulatory Visit
Admission: RE | Admit: 2019-07-01 | Discharge: 2019-07-01 | Disposition: A | Discharge status: Home / Self Care | Payer: Medicare Other | Source: Ambulatory Visit | Attending: Family Medicine | Admitting: Family Medicine

## 2019-07-01 DIAGNOSIS — Z800 Family history of malignant neoplasm of digestive organs: Secondary | ICD-10-CM | POA: Diagnosis not present

## 2019-07-01 DIAGNOSIS — Z86010 Personal history of colonic polyps: Secondary | ICD-10-CM | POA: Diagnosis not present

## 2019-07-01 DIAGNOSIS — Z1231 Encounter for screening mammogram for malignant neoplasm of breast: Secondary | ICD-10-CM | POA: Diagnosis not present

## 2019-07-01 DIAGNOSIS — R197 Diarrhea, unspecified: Secondary | ICD-10-CM | POA: Diagnosis not present

## 2019-07-14 DIAGNOSIS — E1159 Type 2 diabetes mellitus with other circulatory complications: Secondary | ICD-10-CM | POA: Diagnosis not present

## 2019-07-14 DIAGNOSIS — J449 Chronic obstructive pulmonary disease, unspecified: Secondary | ICD-10-CM | POA: Diagnosis not present

## 2019-07-14 DIAGNOSIS — I739 Peripheral vascular disease, unspecified: Secondary | ICD-10-CM | POA: Diagnosis not present

## 2019-08-04 ENCOUNTER — Encounter: Payer: Self-pay | Admitting: Family Medicine

## 2019-08-04 DIAGNOSIS — K635 Polyp of colon: Secondary | ICD-10-CM | POA: Diagnosis not present

## 2019-08-04 DIAGNOSIS — D125 Benign neoplasm of sigmoid colon: Secondary | ICD-10-CM | POA: Diagnosis not present

## 2019-08-04 DIAGNOSIS — Z86010 Personal history of colonic polyps: Secondary | ICD-10-CM | POA: Diagnosis not present

## 2019-08-07 ENCOUNTER — Encounter: Payer: Self-pay | Admitting: Family Medicine

## 2019-08-07 LAB — HM COLONOSCOPY

## 2019-08-14 ENCOUNTER — Encounter: Payer: Self-pay | Admitting: Family Medicine

## 2019-08-14 LAB — HM COLONOSCOPY

## 2019-08-24 ENCOUNTER — Telehealth: Payer: Self-pay

## 2019-08-24 DIAGNOSIS — Z794 Long term (current) use of insulin: Secondary | ICD-10-CM

## 2019-08-24 DIAGNOSIS — E119 Type 2 diabetes mellitus without complications: Secondary | ICD-10-CM

## 2019-08-24 MED ORDER — LANTUS SOLOSTAR 100 UNIT/ML ~~LOC~~ SOPN: PEN_INJECTOR | SUBCUTANEOUS | 3 refills | Status: DC

## 2019-09-09 ENCOUNTER — Ambulatory Visit (INDEPENDENT_AMBULATORY_CARE_PROVIDER_SITE_OTHER): Payer: Medicare Other | Admitting: Registered Nurse

## 2019-09-09 ENCOUNTER — Encounter: Payer: Self-pay | Admitting: Registered Nurse

## 2019-09-09 ENCOUNTER — Other Ambulatory Visit: Payer: Self-pay

## 2019-09-09 VITALS — BP 170/93 | HR 100 | Temp 97.6°F | Resp 18 | Ht 67.0 in | Wt 173.8 lb

## 2019-09-09 DIAGNOSIS — I10 Essential (primary) hypertension: Secondary | ICD-10-CM

## 2019-09-09 DIAGNOSIS — Z794 Long term (current) use of insulin: Secondary | ICD-10-CM

## 2019-09-09 DIAGNOSIS — E1165 Type 2 diabetes mellitus with hyperglycemia: Secondary | ICD-10-CM

## 2019-09-09 DIAGNOSIS — E119 Type 2 diabetes mellitus without complications: Secondary | ICD-10-CM | POA: Diagnosis not present

## 2019-09-09 LAB — POCT GLYCOSYLATED HEMOGLOBIN (HGB A1C): Hemoglobin A1C: 9.1 % — AB (ref 4.0–5.6)

## 2019-09-09 MED ORDER — BD PEN NEEDLE NANO U/F 32G X 4 MM MISC: 1 refills | Status: DC

## 2019-09-09 MED ORDER — SITAGLIPTIN PHOS-METFORMIN HCL 50-1000 MG PO TABS: 1.0000 | ORAL_TABLET | Freq: Two times a day (BID) | ORAL | 0 refills | Status: DC

## 2019-09-09 MED ORDER — LANTUS SOLOSTAR 100 UNIT/ML ~~LOC~~ SOPN: PEN_INJECTOR | SUBCUTANEOUS | 3 refills | Status: DC

## 2019-09-09 NOTE — Patient Instructions (Signed)
° ° ° °  If you have lab work done today you will be contacted with your lab results within the next 2 weeks.  If you have not heard from us then please contact us. The fastest way to get your results is to register for My Chart. ° ° °IF you received an x-ray today, you will receive an invoice from [facility]. Please contact [facility] at [phone] with questions or concerns regarding your invoice.  ° °IF you received labwork today, you will receive an invoice from [facility]. Please contact [facility] at [phone] with questions or concerns regarding your invoice.  ° °Our billing staff will not be able to assist you with questions regarding bills from these companies. ° °You will be contacted with the lab results as soon as they are available. The fastest way to get your results is to activate your My Chart account. Instructions are located on the last page of this paperwork. If you have not heard from us regarding the results in 2 weeks, please contact this office. °  ° ° ° °

## 2019-09-10 NOTE — Progress Notes (Signed)
Established Patient Office Visit  Subjective:  Patient ID: [name], female    DOB: 1945-04-02  Age: 74 y.o. MRN: [id number]  CC:  Chief Complaint  Patient presents with  . Follow-up    follow up for diabetes. per patient she has no questions or concerns    HPI [name] presents for follow up on t2dm  Reports her diet has been slipping. Has not been doing much since covid, unfortunately has lost friends and mostly sedentary  Taking metformin and lantus as prescribed. Good compliance Denies symptoms of E9BM complications.   No concerns or complaints.   Past Medical History:  Diagnosis Date  . COPD (chronic obstructive pulmonary disease) ([place])   . Diabetes mellitus   . Hyperlipidemia   . Hypertension   . Night sweats   . Rectal polyp     Past Surgical History:  Procedure Laterality Date  . ABDOMINAL HYSTERECTOMY  2000   fibroids; ovaries intact.  Marland Kitchen BACK SURGERY     plate put in back  . CHOLECYSTECTOMY N/A 05/01/2017   Procedure: LAPAROSCOPIC CHOLECYSTECTOMY WITH INTRAOPERATIVE CHOLANGIOGRAM;  Surgeon: Donnie Mesa, MD;  Location: [facility];  Service: General;  Laterality: N/A;  . 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 TUBAL LIGATION      Family History  Problem Relation Age of Onset  . Diabetes Sister   . Diabetes Brother   . Hypertension Brother   . Cancer Mother 32       colon(age 81) and lung (age 19)  . Diabetes Mother   . Heart disease Mother 27       CABG  . Hyperlipidemia Mother   . Cancer Father 75       stomach  . Diabetes Maternal Grandmother   . Mental illness Sister     Social History   Socioeconomic History  . Marital status: Divorced    Spouse name: Not on file  . Number of children: 3  . Years of education: Not on file  . Highest education level: Not on file  Occupational History  . Occupation: retired    Comment: [employer] Use  . Smoking status: Current Every Day Smoker     Packs/day: 0.50    Years: 50.00    Pack years: 25.00    Types: Cigarettes  . Smokeless tobacco: Never Used  Vaping Use  . Vaping Use: Never used  Substance and Sexual Activity  . Alcohol use: No  . Drug use: No  . Sexual activity: Not on file  Other Topics Concern  . Not on file  Social History Narrative   Marital status: divorced; not dating in 2019 but interested slightly.        Children:  3 children; 7 seven grandchildren; 1 gg      Lives: with oldest daughter, 2 grandchildren, 70 gg, 1 friend of daughters, daughter's boyfriend.       Employment: retired [employer] [place] age 45.      Tobacco; 1ppd x 50 years.  Not interested in 2019.      Alcohol: none      Exercise: none in 2019. [place]      ADLs:   independent with ADLs; no assistant devices      Advanced Directives:  None; DNR/DNI; no HCPOA.    Social Determinants of Health   Financial Resource Strain:   . Difficulty of Paying Living Expenses: Not on file  Food Insecurity:   . Worried About Charity fundraiser in the Last Year: Not on file  . Ran Out of Food in the Last Year: Not on file  Transportation Needs:   . Lack of Transportation (Medical): Not on file  . Lack of Transportation (Non-Medical): Not on file  Physical Activity:   . Days of Exercise per Week: Not on file  . Minutes of Exercise per Session: Not on file  Stress:   . Feeling of Stress : Not on file  Social Connections:   . Frequency of Communication with Friends and Family: Not on file  . Frequency of Social Gatherings with Friends and Family: Not on file  . Attends Religious Services: Not on file  . Active Member of Clubs or Organizations: Not on file  . Attends Archivist Meetings: Not on file  . Marital Status: Not on file  Intimate Partner Violence:   . Fear of Current or Ex-Partner: Not on file  . Emotionally Abused: Not on file  . Physically Abused: Not on file  . Sexually Abused: Not on file    Outpatient  Medications Prior to Visit  Medication Sig Dispense Refill  . ACCU-CHEK GUIDE test strip USE UP TO 4 TIMES DAILY AS  DIRECTED 200 strip 6  . Accu-Chek Softclix Lancets lancets USE UP TO 4 TIMES DAILY AS  DIRECTED 200 each 6  . aspirin 81 MG tablet Take 81 mg by mouth daily.      . blood glucose meter kit and supplies KIT Dispense based on patient and insurance preference. Use up to four times daily as directed. (FOR ICD-9 250.00, 250.01). 1 each 0  . blood glucose meter kit and supplies Dispense based on insurance preference. Use up to four times daily as directed. (FOR ICD-10 E11.9 E11.65) AccuCheck Meter    1 each 0  . BREO ELLIPTA 100-25 MCG/INH AEPB USE 1 INHALATION BY MOUTH  ONCE DAILY AT THE SAME TIME EACH DAY 180 each 3  . Cholecalciferol (VITAMIN D3) 1000 UNITS CAPS Take 2 capsules by mouth every morning.     Marland Kitchen lisinopril (ZESTRIL) 20 MG tablet Take 1 tablet (20 mg total) by mouth daily. 90 tablet 2  . metFORMIN (GLUCOPHAGE) 1000 MG tablet TAKE 1 TABLET BY MOUTH  TWICE DAILY WITH A MEAL 180 tablet 1  . metoprolol succinate (TOPROL-XL) 25 MG 24 hr tablet TAKE 1 TABLET BY MOUTH  DAILY 90 tablet 3  . pravastatin (PRAVACHOL) 40 MG tablet TAKE 1 TABLET BY MOUTH IN  THE EVENING AFTER A MEAL 90 tablet 3  . SPIRIVA HANDIHALER 18 MCG inhalation capsule INHALE THE CONTENTS OF 1  CAPSULE BY MOUTH VIA  HANDIHALER DAILY 90 capsule 3  . insulin glargine (LANTUS SOLOSTAR) 100 UNIT/ML Solostar Pen INJECT SUBCUTANEOUSLY 55  UNITS DAILY AT 10PM 45 mL 3  . Insulin Pen Needle (BD PEN NEEDLE NANO U/F) 32G X 4 MM MISC USE AS DIRECTED 90 each 1  . ibuprofen (ADVIL) 800 MG tablet Take 1 tablet (800 mg total) by mouth every 8 (eight) hours as needed. (Patient not taking: Reported on 09/09/2019) 30 tablet 0   No facility-administered medications prior to visit.    Allergies  Allergen Reactions  . Colestipol Diarrhea    Diarrhea, gas, stomach cramps     ROS Review of Systems  Constitutional: Negative.     HENT: Negative.   Eyes: Negative.   Respiratory: Negative.   Cardiovascular: Negative.   Gastrointestinal: Negative.  Endocrine: Negative.   Genitourinary: Negative.   Musculoskeletal: Negative.   Skin: Negative.   Allergic/Immunologic: Negative.   Neurological: Negative.   Hematological: Negative.   Psychiatric/Behavioral: Negative.   All other systems reviewed and are negative.     Objective:    Physical Exam Vitals and nursing note reviewed.  Constitutional:      General: She is not in acute distress.    Appearance: Normal appearance. She is normal weight. She is not ill-appearing, toxic-appearing or diaphoretic.  Cardiovascular:     Rate and Rhythm: Normal rate and regular rhythm.     Pulses: Normal pulses.     Heart sounds: Normal heart sounds.  Pulmonary:     Effort: Pulmonary effort is normal. No respiratory distress.     Breath sounds: Normal breath sounds.  Skin:    General: Skin is warm and dry.  Neurological:     General: No focal deficit present.     Mental Status: She is alert and oriented to person, place, and time. Mental status is at baseline.  Psychiatric:        Mood and Affect: Mood normal.        Behavior: Behavior normal.        Thought Content: Thought content normal.        Judgment: Judgment normal.     BP (!) 170/93   Pulse 100   Temp 97.6 F (36.4 C) (Temporal)   Resp 18   Ht 5\' 7"  (1.702 m)   Wt 173 lb 12.8 oz (78.8 kg)   SpO2 97%   BMI 27.22 kg/m  Wt Readings from Last 3 Encounters:  09/09/19 173 lb 12.8 oz (78.8 kg)  05/13/19 174 lb 3.2 oz (79 kg)  02/23/19 176 lb 9.6 oz (80.1 kg)     Health Maintenance Due  Topic Date Due  . OPHTHALMOLOGY EXAM  06/27/2019  . INFLUENZA VACCINE  08/16/2019    There are no preventive care reminders to display for this patient.  Lab Results  Component Value Date   TSH 0.940 07/24/2017   Lab Results  Component Value Date   WBC 9.2 07/24/2017   HGB 14.5 07/24/2017   HCT 46.4  07/24/2017   MCV 86 07/24/2017   PLT 358 07/24/2017   Lab Results  Component Value Date   NA 138 05/13/2019   K 4.6 05/13/2019   CO2 20 05/13/2019   GLUCOSE 194 (H) 05/13/2019   BUN 10 05/13/2019   CREATININE 0.88 05/13/2019   BILITOT <0.2 05/13/2019   ALKPHOS 135 (H) 05/13/2019   AST 14 05/13/2019   ALT 10 05/13/2019   PROT 6.7 05/13/2019   ALBUMIN 4.1 05/13/2019   CALCIUM 9.8 05/13/2019   ANIONGAP 13 04/17/2017   Lab Results  Component Value Date   CHOL 135 10/14/2018   Lab Results  Component Value Date   HDL 50 10/14/2018   Lab Results  Component Value Date   LDLCALC 68 10/14/2018   Lab Results  Component Value Date   TRIG 92 10/14/2018   Lab Results  Component Value Date   CHOLHDL 2.7 10/14/2018   Lab Results  Component Value Date   HGBA1C 9.1 (A) 09/09/2019      Assessment & Plan:   Problem List Items Addressed This Visit      Cardiovascular and Mediastinum   HTN (hypertension)     Endocrine   Type 2 diabetes mellitus without complication, with long-term current use of insulin ([facility])   Relevant Medications  sitaGLIPtin-metformin (JANUMET) 50-1000 MG tablet   Insulin Pen Needle (BD PEN NEEDLE NANO U/F) 32G X 4 MM MISC   insulin glargine (LANTUS SOLOSTAR) 100 UNIT/ML Solostar Pen    Other Visit Diagnoses    Type 2 diabetes mellitus with hyperglycemia, with long-term current use of insulin ([facility])    -  Primary   Relevant Medications   sitaGLIPtin-metformin (JANUMET) 50-1000 MG tablet   Insulin Pen Needle (BD PEN NEEDLE NANO U/F) 32G X 4 MM MISC   insulin glargine (LANTUS SOLOSTAR) 100 UNIT/ML Solostar Pen   Other Relevant Orders   POCT glycosylated hemoglobin (Hb A1C) (Completed)      Meds ordered this encounter  Medications  . sitaGLIPtin-metformin (JANUMET) 50-1000 MG tablet    Sig: Take 1 tablet by mouth 2 (two) times daily with a meal.    Dispense:  180 tablet    Refill:  0    Order Specific Question:   Supervising Provider     Answer:   Carlota Raspberry, JEFFREY R [[id number]]  . Insulin Pen Needle (BD PEN NEEDLE NANO U/F) 32G X 4 MM MISC    Sig: USE AS DIRECTED    Dispense:  90 each    Refill:  1    Order Specific Question:   Supervising Provider    Answer:   Carlota Raspberry, JEFFREY R [[id number]]  . insulin glargine (LANTUS SOLOSTAR) 100 UNIT/ML Solostar Pen    Sig: INJECT SUBCUTANEOUSLY 55  UNITS DAILY AT 10PM    Dispense:  45 mL    Refill:  3    Requesting 1 year supply    Order Specific Question:   Supervising Provider    Answer:   Carlota Raspberry, JEFFREY R [[id number]]    Follow-up: Return in about 3 months (around 12/10/2019) for t2dm check.   PLAN  a1c up to 9.1.   Will change metformin to janumet 50-$RemoveBefor'1000mg'[id number]$  PO bid  Return in 69mo  Reviewed lifestyle modifications for t2dm  Patient encouraged to call clinic with any questions, comments, or concerns.  Maximiano Coss, NP

## 2019-09-17 ENCOUNTER — Telehealth: Payer: Self-pay | Admitting: Registered Nurse

## 2019-09-17 NOTE — Telephone Encounter (Signed)
Pt needs clarification on new med she is taking  sitaGLIPtin-metformin (JANUMET) 50-1000 MG tablet [[id number]]  Pt is wondering if she needs  to discontinue her regular metformin ,   Please reach out to patient

## 2019-09-17 NOTE — Telephone Encounter (Signed)
Called pt and informed her that based on what I see in her chart the SitaGLIPtin-metformin(Janumet) is what she should be taking and to discontinue taking the regular Metformin. Pt seemed to understand and agreed to comply with change of treatment.

## 2019-10-20 ENCOUNTER — Ambulatory Visit: Payer: Medicare Other | Attending: Internal Medicine

## 2019-10-20 DIAGNOSIS — Z23 Encounter for immunization: Secondary | ICD-10-CM

## 2019-10-20 NOTE — Progress Notes (Signed)
   Covid-19 Vaccination Clinic  Name:  [name]    MRN: [id number] DOB: [birth year]  10/20/2019  [name] was observed post Covid-19 immunization for 15 minutes without incident. She was provided with Vaccine Information Sheet and instruction to access the V-Safe system.   [name] was instructed to call 911 with any severe reactions post vaccine: Marland Kitchen Difficulty breathing  . Swelling of face and throat  . A fast heartbeat  . A bad rash all over body  . Dizziness and weakness

## 2019-11-10 ENCOUNTER — Other Ambulatory Visit: Payer: Self-pay | Admitting: Registered Nurse

## 2019-11-10 DIAGNOSIS — E1165 Type 2 diabetes mellitus with hyperglycemia: Secondary | ICD-10-CM

## 2019-11-10 DIAGNOSIS — Z794 Long term (current) use of insulin: Secondary | ICD-10-CM

## 2019-12-14 ENCOUNTER — Ambulatory Visit (INDEPENDENT_AMBULATORY_CARE_PROVIDER_SITE_OTHER): Payer: Medicare Other | Admitting: Registered Nurse

## 2019-12-14 ENCOUNTER — Other Ambulatory Visit: Payer: Self-pay

## 2019-12-14 ENCOUNTER — Encounter: Payer: Self-pay | Admitting: Registered Nurse

## 2019-12-14 VITALS — BP 146/82 | HR 101 | Temp 98.0°F | Resp 18 | Ht 67.0 in | Wt 175.0 lb

## 2019-12-14 DIAGNOSIS — Z794 Long term (current) use of insulin: Secondary | ICD-10-CM | POA: Diagnosis not present

## 2019-12-14 DIAGNOSIS — E1165 Type 2 diabetes mellitus with hyperglycemia: Secondary | ICD-10-CM | POA: Diagnosis not present

## 2019-12-14 LAB — POCT GLYCOSYLATED HEMOGLOBIN (HGB A1C): Hemoglobin A1C: 8.9 % — AB (ref 4.0–5.6)

## 2019-12-14 MED ORDER — DAPAGLIFLOZIN PROPANEDIOL 5 MG PO TABS: 5.0000 mg | ORAL_TABLET | Freq: Every day | ORAL | 0 refills | Status: DC

## 2019-12-14 NOTE — Patient Instructions (Addendum)
Healthy recipes:  Vegan "BLT" with avocado [email or web address]  [employer] Fleeta Emmer [email or web address]  Coconut Apple Ginger Dal (Stew) [email or web address]  Sweet Potato and Black Bean Chili  [email or web address]    If you have lab work done today you will be contacted with your lab results within the next 2 weeks.  If you have not heard from [place] then please contact us. The fastest way to get your results is to register for My Chart.   IF you received an x-ray today, you will receive an invoice from [facility]. Please contact [facility] at [phone] with questions or concerns regarding your invoice.   IF you received labwork today, you will receive an invoice from [facility]. Please contact [facility] at [phone] with questions or concerns regarding your invoice.   Our billing staff will not be able to assist you with questions regarding bills from these companies.  You will be contacted with the lab results as soon as they are available. The fastest way to get your results is to activate your My Chart account. Instructions are located on the last page of this paperwork. If you have not heard from [place] regarding the results in 2 weeks, please contact this office.      Carbohydrate Counting for Diabetes Mellitus, Adult  Carbohydrate counting is a method of keeping track of how many carbohydrates you eat. Eating carbohydrates naturally increases the amount of sugar (glucose) in the blood. Counting how many carbohydrates you eat helps keep your blood glucose within normal limits, which helps you manage your diabetes (diabetes mellitus). It is important to know how many carbohydrates you can safely have in each meal. This is different for every person. A diet and nutrition specialist (registered dietitian) can help you make  a meal plan and calculate how many carbohydrates you should have at each meal and snack. Carbohydrates are found in the following foods:  Grains, such as breads and cereals.  Dried beans and soy products.  Starchy vegetables, such as potatoes, peas, and corn.  Fruit and fruit juices.  Milk and yogurt.  Sweets and snack foods, such as cake, cookies, candy, chips, and soft drinks. How do I count carbohydrates? There are two ways to count carbohydrates in food. You can use either of the methods or a combination of both. Reading "Nutrition Facts" on packaged food The "Nutrition Facts" list is included on the labels of almost all packaged foods and beverages in the [place] It includes:  The serving size.  Information about nutrients in each serving, including the grams (g) of carbohydrate per serving. To use the "Nutrition Facts":  Decide how many servings you will have.  Multiply the number of servings by the number of carbohydrates per serving.  The resulting number is the total amount of carbohydrates that you will be having. Learning standard serving sizes of other foods When you eat carbohydrate foods that are not packaged or do not include "Nutrition Facts" on the label, you need to measure the servings in order to count the amount of carbohydrates:  Measure the foods that you will eat with a food scale or measuring cup, if needed.  Decide how many standard-size servings you will eat.  Multiply the number of servings by 15. Most carbohydrate-rich foods have about 15 g of carbohydrates per serving. ? For example, if you eat 8 oz (170 g) of strawberries, you will have eaten 2 servings and 30 g of carbohydrates (2 servings x 15 g = 30 g).  For foods  that have more than one food mixed, such as soups and casseroles, you must count the carbohydrates in each food that is included. The following list contains standard serving sizes of common carbohydrate-rich foods. Each of these  servings has about 15 g of carbohydrates:   hamburger bun or  English muffin.   oz (15 mL) syrup.   oz (14 g) jelly.  1 slice of bread.  1 six-inch tortilla.  3 oz (85 g) cooked rice or pasta.  4 oz (113 g) cooked dried beans.  4 oz (113 g) starchy vegetable, such as peas, corn, or potatoes.  4 oz (113 g) hot cereal.  4 oz (113 g) mashed potatoes or  of a large baked potato.  4 oz (113 g) canned or frozen fruit.  4 oz (120 mL) fruit juice.  4-6 crackers.  6 chicken nuggets.  6 oz (170 g) unsweetened dry cereal.  6 oz (170 g) plain fat-free yogurt or yogurt sweetened with artificial sweeteners.  8 oz (240 mL) milk.  8 oz (170 g) fresh fruit or one small piece of fruit.  24 oz (680 g) popped popcorn. Example of carbohydrate counting Sample meal  3 oz (85 g) chicken breast.  6 oz (170 g) brown rice.  4 oz (113 g) corn.  8 oz (240 mL) milk.  8 oz (170 g) strawberries with sugar-free whipped topping. Carbohydrate calculation 1. Identify the foods that contain carbohydrates: ? Rice. ? Corn. ? Milk. ? Strawberries. 2. Calculate how many servings you have of each food: ? 2 servings rice. ? 1 serving corn. ? 1 serving milk. ? 1 serving strawberries. 3. Multiply each number of servings by 15 g: ? 2 servings rice x 15 g = 30 g. ? 1 serving corn x 15 g = 15 g. ? 1 serving milk x 15 g = 15 g. ? 1 serving strawberries x 15 g = 15 g. 4. Add together all of the amounts to find the total grams of carbohydrates eaten: ? 30 g + 15 g + 15 g + 15 g = 75 g of carbohydrates total. Summary  Carbohydrate counting is a method of keeping track of how many carbohydrates you eat.  Eating carbohydrates naturally increases the amount of sugar (glucose) in the blood.  Counting how many carbohydrates you eat helps keep your blood glucose within normal limits, which helps you manage your diabetes.  A diet and nutrition specialist (registered dietitian) can help you  make a meal plan and calculate how many carbohydrates you should have at each meal and snack. This information is not intended to replace advice given to you by your health care provider. Make sure you discuss any questions you have with your health care provider. Document Revised: 07/26/2016 Document Reviewed: 06/15/2015 Elsevier Patient Education  [place] DASH stands for "Dietary Approaches to Stop Hypertension." The DASH eating plan is a healthy eating plan that has been shown to reduce high blood pressure (hypertension). It may also reduce your risk for type 2 diabetes, heart disease, and stroke. The DASH eating plan may also help with weight loss. What are tips for following this plan?  General guidelines  Avoid eating more than 2,300 mg (milligrams) of salt (sodium) a day. If you have hypertension, you may need to reduce your sodium intake to 1,500 mg a day.  Limit alcohol intake to no more than 1 drink a day for nonpregnant women and 2 drinks a day for  men. One drink equals 12 oz of beer, 5 oz of wine, or 1 oz of hard liquor.  Work with your health care provider to maintain a healthy body weight or to lose weight. Ask what an ideal weight is for you.  Get at least 30 minutes of exercise that causes your heart to beat faster (aerobic exercise) most days of the week. Activities may include walking, swimming, or biking.  Work with your health care provider or diet and nutrition specialist (dietitian) to adjust your eating plan to your individual calorie needs. Reading food labels   Check food labels for the amount of sodium per serving. Choose foods with less than 5 percent of the Daily Value of sodium. Generally, foods with less than 300 mg of sodium per serving fit into this eating plan.  To find whole grains, look for the word "whole" as the first word in the ingredient list. Shopping  Buy products labeled as "low-sodium" or "no salt added."  Buy  fresh foods. Avoid canned foods and premade or frozen meals. Cooking  Avoid adding salt when cooking. Use salt-free seasonings or herbs instead of table salt or sea salt. Check with your health care provider or pharmacist before using salt substitutes.  Do not fry foods. Cook foods using healthy methods such as baking, boiling, grilling, and broiling instead.  Cook with heart-healthy oils, such as olive, canola, soybean, or sunflower oil. Meal planning  Eat a balanced diet that includes: ? 5 or more servings of fruits and vegetables each day. At each meal, try to fill half of your plate with fruits and vegetables. ? Up to 6-8 servings of whole grains each day. ? Less than 6 oz of lean meat, poultry, or fish each day. A 3-oz serving of meat is about the same size as a deck of cards. One egg equals 1 oz. ? 2 servings of low-fat dairy each day. ? A serving of nuts, seeds, or beans 5 times each week. ? Heart-healthy fats. Healthy fats called Omega-3 fatty acids are found in foods such as flaxseeds and coldwater fish, like sardines, salmon, and mackerel.  Limit how much you eat of the following: ? Canned or prepackaged foods. ? Food that is high in trans fat, such as fried foods. ? Food that is high in saturated fat, such as fatty meat. ? Sweets, desserts, sugary drinks, and other foods with added sugar. ? Full-fat dairy products.  Do not salt foods before eating.  Try to eat at least 2 vegetarian meals each week.  Eat more home-cooked food and less restaurant, buffet, and fast food.  When eating at a restaurant, ask that your food be prepared with less salt or no salt, if possible. What foods are recommended? The items listed may not be a complete list. Talk with your dietitian about what dietary choices are best for you. Grains Whole-grain or whole-wheat bread. Whole-grain or whole-wheat pasta. Brown rice. [place]. Bulgur. Whole-grain and low-sodium cereals. Pita bread.  Low-fat, low-sodium crackers. Whole-wheat flour tortillas. Vegetables Fresh or frozen vegetables (raw, steamed, roasted, or grilled). Low-sodium or reduced-sodium tomato and vegetable juice. Low-sodium or reduced-sodium tomato sauce and tomato paste. Low-sodium or reduced-sodium canned vegetables. Fruits All fresh, dried, or frozen fruit. Canned fruit in natural juice (without added sugar). Meat and other protein foods Skinless chicken or [place]. Ground chicken or [place]. Pork with fat trimmed off. Fish and seafood. Egg whites. Dried beans, peas, or lentils. Unsalted nuts, nut butters, and seeds. Unsalted canned  beans. Lean cuts of beef with fat trimmed off. Low-sodium, lean deli meat. Dairy Low-fat (1%) or fat-free (skim) milk. Fat-free, low-fat, or reduced-fat cheeses. Nonfat, low-sodium ricotta or cottage cheese. Low-fat or nonfat yogurt. Low-fat, low-sodium cheese. Fats and oils Soft margarine without trans fats. Vegetable oil. Low-fat, reduced-fat, or light mayonnaise and salad dressings (reduced-sodium). Canola, safflower, olive, soybean, and sunflower oils. Avocado. Seasoning and other foods Herbs. Spices. Seasoning mixes without salt. Unsalted popcorn and pretzels. Fat-free sweets. What foods are not recommended? The items listed may not be a complete list. Talk with your dietitian about what dietary choices are best for you. Grains Baked goods made with fat, such as croissants, muffins, or some breads. Dry pasta or rice meal packs. Vegetables Creamed or fried vegetables. Vegetables in a cheese sauce. Regular canned vegetables (not low-sodium or reduced-sodium). Regular canned tomato sauce and paste (not low-sodium or reduced-sodium). Regular tomato and vegetable juice (not low-sodium or reduced-sodium). Angie Fava. Olives. Fruits Canned fruit in a light or heavy syrup. Fried fruit. Fruit in cream or butter sauce. Meat and other protein foods Fatty cuts of meat. Ribs. Fried meat. Berniece Salines.  Sausage. Bologna and other processed lunch meats. Salami. Fatback. Hotdogs. Bratwurst. Salted nuts and seeds. Canned beans with added salt. Canned or smoked fish. Whole eggs or egg yolks. Chicken or [place] with skin. Dairy Whole or 2% milk, cream, and half-and-half. Whole or full-fat cream cheese. Whole-fat or sweetened yogurt. Full-fat cheese. Nondairy creamers. Whipped toppings. Processed cheese and cheese spreads. Fats and oils Butter. Stick margarine. Lard. Shortening. Ghee. Bacon fat. Tropical oils, such as coconut, palm kernel, or palm oil. Seasoning and other foods Salted popcorn and pretzels. Onion salt, garlic salt, seasoned salt, table salt, and sea salt. Worcestershire sauce. Tartar sauce. Barbecue sauce. Teriyaki sauce. Soy sauce, including reduced-sodium. Steak sauce. Canned and packaged gravies. Fish sauce. Oyster sauce. Cocktail sauce. Horseradish that you find on the shelf. Ketchup. Mustard. Meat flavorings and tenderizers. Bouillon cubes. Hot sauce and Tabasco sauce. Premade or packaged marinades. Premade or packaged taco seasonings. Relishes. Regular salad dressings. Where to find more information:  National Heart, Lung, and [place]: https://wilson-eaton.com/  American Heart Association: www.heart.org Summary  The DASH eating plan is a healthy eating plan that has been shown to reduce high blood pressure (hypertension). It may also reduce your risk for type 2 diabetes, heart disease, and stroke.  With the DASH eating plan, you should limit salt (sodium) intake to 2,300 mg a day. If you have hypertension, you may need to reduce your sodium intake to 1,500 mg a day.  When on the DASH eating plan, aim to eat more fresh fruits and vegetables, whole grains, lean proteins, low-fat dairy, and heart-healthy fats.  Work with your health care provider or diet and nutrition specialist (dietitian) to adjust your eating plan to your individual calorie needs. This information is not intended  to replace advice given to you by your health care provider. Make sure you discuss any questions you have with your health care provider. Document Revised: 12/14/2016 Document Reviewed: 12/26/2015 Elsevier Patient Education  [place].  Diabetes Mellitus and Nutrition, Adult When you have diabetes (diabetes mellitus), it is very important to have healthy eating habits because your blood sugar (glucose) levels are greatly affected by what you eat and drink. Eating healthy foods in the appropriate amounts, at about the same times every day, can help you:  Control your blood glucose.  Lower your risk of heart disease.  Improve your blood  pressure.  Reach or maintain a healthy weight. Every person with diabetes is different, and each person has different needs for a meal plan. Your health care provider may recommend that you work with a diet and nutrition specialist (dietitian) to make a meal plan that is best for you. Your meal plan may vary depending on factors such as:  The calories you need.  The medicines you take.  Your weight.  Your blood glucose, blood pressure, and cholesterol levels.  Your activity level.  Other health conditions you have, such as heart or kidney disease. How do carbohydrates affect me? Carbohydrates, also called carbs, affect your blood glucose level more than any other type of food. Eating carbs naturally raises the amount of glucose in your blood. Carb counting is a method for keeping track of how many carbs you eat. Counting carbs is important to keep your blood glucose at a healthy level, especially if you use insulin or take certain oral diabetes medicines. It is important to know how many carbs you can safely have in each meal. This is different for every person. Your dietitian can help you calculate how many carbs you should have at each meal and for each snack. Foods that contain carbs include:  Bread, cereal, rice, pasta, and  crackers.  Potatoes and corn.  Peas, beans, and lentils.  Milk and yogurt.  Fruit and juice.  Desserts, such as cakes, cookies, ice cream, and candy. How does alcohol affect me? Alcohol can cause a sudden decrease in blood glucose (hypoglycemia), especially if you use insulin or take certain oral diabetes medicines. Hypoglycemia can be a life-threatening condition. Symptoms of hypoglycemia (sleepiness, dizziness, and confusion) are similar to symptoms of having too much alcohol. If your health care provider says that alcohol is safe for you, follow these guidelines:  Limit alcohol intake to no more than 1 drink per day for nonpregnant women and 2 drinks per day for men. One drink equals 12 oz of beer, 5 oz of wine, or 1 oz of hard liquor.  Do not drink on an empty stomach.  Keep yourself hydrated with water, diet soda, or unsweetened iced tea.  Keep in mind that regular soda, juice, and other mixers may contain a lot of sugar and must be counted as carbs. What are tips for following this plan?  Reading food labels  Start by checking the serving size on the "Nutrition Facts" label of packaged foods and drinks. The amount of calories, carbs, fats, and other nutrients listed on the label is based on one serving of the item. Many items contain more than one serving per package.  Check the total grams (g) of carbs in one serving. You can calculate the number of servings of carbs in one serving by dividing the total carbs by 15. For example, if a food has 30 g of total carbs, it would be equal to 2 servings of carbs.  Check the number of grams (g) of saturated and trans fats in one serving. Choose foods that have low or no amount of these fats.  Check the number of milligrams (mg) of salt (sodium) in one serving. Most people should limit total sodium intake to less than 2,300 mg per day.  Always check the nutrition information of foods labeled as "low-fat" or "nonfat". These foods may be  higher in added sugar or refined carbs and should be avoided.  Talk to your dietitian to identify your daily goals for nutrients listed on the label. Shopping  Avoid buying canned, premade, or processed foods. These foods tend to be high in fat, sodium, and added sugar.  Shop around the outside edge of the grocery store. This includes fresh fruits and vegetables, bulk grains, fresh meats, and fresh dairy. Cooking  Use low-heat cooking methods, such as baking, instead of high-heat cooking methods like deep frying.  Cook using healthy oils, such as olive, canola, or sunflower oil.  Avoid cooking with butter, cream, or high-fat meats. Meal planning  Eat meals and snacks regularly, preferably at the same times every day. Avoid going long periods of time without eating.  Eat foods high in fiber, such as fresh fruits, vegetables, beans, and whole grains. Talk to your dietitian about how many servings of carbs you can eat at each meal.  Eat 4-6 ounces (oz) of lean protein each day, such as lean meat, chicken, fish, eggs, or tofu. One oz of lean protein is equal to: ? 1 oz of meat, chicken, or fish. ? 1 egg. ?  cup of tofu.  Eat some foods each day that contain healthy fats, such as avocado, nuts, seeds, and fish. Lifestyle  Check your blood glucose regularly.  Exercise regularly as told by your health care provider. This may include: ? 150 minutes of moderate-intensity or vigorous-intensity exercise each week. This could be brisk walking, biking, or water aerobics. ? Stretching and doing strength exercises, such as yoga or weightlifting, at least 2 times a week.  Take medicines as told by your health care provider.  Do not use any products that contain nicotine or tobacco, such as cigarettes and e-cigarettes. If you need help quitting, ask your health care provider.  Work with a Veterinary surgeon or diabetes educator to identify strategies to manage stress and any emotional and social  challenges. Questions to ask a health care provider  Do I need to meet with a diabetes educator?  Do I need to meet with a dietitian?  What number can I call if I have questions?  When are the best times to check my blood glucose? Where to find more information:  American Diabetes Association: diabetes.org  Academy of Nutrition and Dietetics: www.eatright.[employer] of Diabetes and Digestive and Kidney Diseases (NIH): CarFlippers.tn Summary  A healthy meal plan will help you control your blood glucose and maintain a healthy lifestyle.  Working with a diet and nutrition specialist (dietitian) can help you make a meal plan that is best for you.  Keep in mind that carbohydrates (carbs) and alcohol have immediate effects on your blood glucose levels. It is important to count carbs and to use alcohol carefully. This information is not intended to replace advice given to you by your health care provider. Make sure you discuss any questions you have with your health care provider. Document Revised: 12/14/2016 Document Reviewed: 02/06/2016 Elsevier Patient Education  2020 [employer].  Healthy Eating Following a healthy eating pattern may help you to achieve and maintain a healthy body weight, reduce the risk of chronic disease, and live a long and productive life. It is important to follow a healthy eating pattern at an appropriate calorie level for your body. Your nutritional needs should be met primarily through food by choosing a variety of nutrient-rich foods. What are tips for following this plan? Reading food labels  Read labels and choose the following: ? Reduced or low sodium. ? Juices with 100% fruit juice. ? Foods with low saturated fats and high polyunsaturated and monounsaturated fats. ? Foods with  whole grains, such as whole wheat, cracked wheat, brown rice, and wild rice. ? Whole grains that are fortified with folic acid. This is recommended for women  who are pregnant or who want to become pregnant.  Read labels and avoid the following: ? Foods with a lot of added sugars. These include foods that contain brown sugar, corn sweetener, corn syrup, dextrose, fructose, glucose, high-fructose corn syrup, honey, invert sugar, lactose, malt syrup, maltose, molasses, raw sugar, sucrose, trehalose, or turbinado sugar.  Do not eat more than the following amounts of added sugar per day:  6 teaspoons (25 g) for women.  9 teaspoons (38 g) for men. ? Foods that contain processed or refined starches and grains. ? Refined grain products, such as white flour, degermed cornmeal, white bread, and white rice. Shopping  Choose nutrient-rich snacks, such as vegetables, whole fruits, and nuts. Avoid high-calorie and high-sugar snacks, such as potato chips, fruit snacks, and candy.  Use oil-based dressings and spreads on foods instead of solid fats such as butter, stick margarine, or cream cheese.  Limit pre-made sauces, mixes, and "instant" products such as flavored rice, instant noodles, and ready-made pasta.  Try more plant-protein sources, such as tofu, tempeh, black beans, edamame, lentils, nuts, and seeds.  Explore eating plans such as the Mediterranean diet or vegetarian diet. Cooking  Use oil to saut or stir-fry foods instead of solid fats such as butter, stick margarine, or lard.  Try baking, boiling, grilling, or broiling instead of frying.  Remove the fatty part of meats before cooking.  Steam vegetables in water or broth. Meal planning   At meals, imagine dividing your plate into fourths: ? One-half of your plate is fruits and vegetables. ? One-fourth of your plate is whole grains. ? One-fourth of your plate is protein, especially lean meats, poultry, eggs, tofu, beans, or nuts.  Include low-fat dairy as part of your daily diet. Lifestyle  Choose healthy options in all settings, including home, work, school, restaurants, or  stores.  Prepare your food safely: ? Wash your hands after handling raw meats. ? Keep food preparation surfaces clean by regularly washing with hot, soapy water. ? Keep raw meats separate from ready-to-eat foods, such as fruits and vegetables. ? Cook seafood, meat, poultry, and eggs to the recommended internal temperature. ? Store foods at safe temperatures. In general:  Keep cold foods at 69F (4.4C) or below.  Keep hot foods at 169F (60C) or above.  Keep your freezer at [facility] (-17.8C) or below.  Foods are no longer safe to eat when they have been between the temperatures of 40-169F (4.4-60C) for more than 2 hours. What foods should I eat? Fruits Aim to eat 2 cup-equivalents of fresh, canned (in natural juice), or frozen fruits each day. Examples of 1 cup-equivalent of fruit include 1 small apple, 8 large strawberries, 1 cup canned fruit,  cup dried fruit, or 1 cup 100% juice. Vegetables Aim to eat 2-3 cup-equivalents of fresh and frozen vegetables each day, including different varieties and colors. Examples of 1 cup-equivalent of vegetables include 2 medium carrots, 2 cups raw, leafy greens, 1 cup chopped vegetable (raw or cooked), or 1 medium baked potato. Grains Aim to eat 6 ounce-equivalents of whole grains each day. Examples of 1 ounce-equivalent of grains include 1 slice of bread, 1 cup ready-to-eat cereal, 3 cups popcorn, or  cup cooked rice, pasta, or cereal. Meats and other proteins Aim to eat 5-6 ounce-equivalents of protein each day. Examples of 1 ounce-equivalent  of protein include 1 egg, 1/2 cup nuts or seeds, or 1 tablespoon (16 g) peanut butter. A cut of meat or fish that is the size of a deck of cards is about 3-4 ounce-equivalents.  Of the protein you eat each week, try to have at least 8 ounces come from seafood. This includes salmon, trout, herring, and anchovies. Dairy Aim to eat 3 cup-equivalents of fat-free or low-fat dairy each day. Examples of 1  cup-equivalent of dairy include 1 cup (240 mL) milk, 8 ounces (250 g) yogurt, 1 ounces (44 g) natural cheese, or 1 cup (240 mL) fortified soy milk. Fats and oils  Aim for about 5 teaspoons (21 g) per day. Choose monounsaturated fats, such as canola and olive oils, avocados, peanut butter, and most nuts, or polyunsaturated fats, such as sunflower, corn, and soybean oils, walnuts, pine nuts, sesame seeds, sunflower seeds, and flaxseed. Beverages  Aim for six 8-oz glasses of water per day. Limit coffee to three to five 8-oz cups per day.  Limit caffeinated beverages that have added calories, such as soda and energy drinks.  Limit alcohol intake to no more than 1 drink a day for nonpregnant women and 2 drinks a day for men. One drink equals 12 oz of beer (355 mL), 5 oz of wine (148 mL), or 1 oz of hard liquor (44 mL). Seasoning and other foods  Avoid adding excess amounts of salt to your foods. Try flavoring foods with herbs and spices instead of salt.  Avoid adding sugar to foods.  Try using oil-based dressings, sauces, and spreads instead of solid fats. This information is based on general [place] nutrition guidelines. For more information, visit [employer]. Exact amounts may vary based on your nutrition needs. Summary  A healthy eating plan may help you to maintain a healthy weight, reduce the risk of chronic diseases, and stay active throughout your life.  Plan your meals. Make sure you eat the right portions of a variety of nutrient-rich foods.  Try baking, boiling, grilling, or broiling instead of frying.  Choose healthy options in all settings, including home, work, school, restaurants, or stores. This information is not intended to replace advice given to you by your health care provider. Make sure you discuss any questions you have with your health care provider. Document Revised: 04/15/2017 Document Reviewed: 04/15/2017 Elsevier Patient Education  [place] Many factors influence your heart (coronary) health, including eating and exercise habits. Coronary risk increases with abnormal blood fat (lipid) levels. Heart-healthy meal planning includes limiting unhealthy fats, increasing healthy fats, and making other diet and lifestyle changes. What is my plan? Your health care provider may recommend that you:  Limit your fat intake to _________% or less of your total calories each day.  Limit your saturated fat intake to _________% or less of your total calories each day.  Limit the amount of cholesterol in your diet to less than _________ mg per day. What are tips for following this plan? Cooking Cook foods using methods other than frying. Baking, boiling, grilling, and broiling are all good options. Other ways to reduce fat include:  Removing the skin from poultry.  Removing all visible fats from meats.  Steaming vegetables in water or broth. Meal planning   At meals, imagine dividing your plate into fourths: ? Fill one-half of your plate with vegetables and green salads. ? Fill one-fourth of your plate with whole grains. ? Fill one-fourth of your plate with lean protein  foods.  Eat 4-5 servings of vegetables per day. One serving equals 1 cup raw or cooked vegetable, or 2 cups raw leafy greens.  Eat 4-5 servings of fruit per day. One serving equals 1 medium whole fruit,  cup dried fruit,  cup fresh, frozen, or canned fruit, or  cup 100% fruit juice.  Eat more foods that contain soluble fiber. Examples include apples, broccoli, carrots, beans, peas, and barley. Aim to get 25-30 g of fiber per day.  Increase your consumption of legumes, nuts, and seeds to 4-5 servings per week. One serving of dried beans or legumes equals  cup cooked, 1 serving of nuts is  cup, and 1 serving of seeds equals 1 tablespoon. Fats  Choose healthy fats more often. Choose monounsaturated and polyunsaturated fats, such as  olive and canola oils, flaxseeds, walnuts, almonds, and seeds.  Eat more omega-3 fats. Choose salmon, mackerel, sardines, tuna, flaxseed oil, and ground flaxseeds. Aim to eat fish at least 2 times each week.  Check food labels carefully to identify foods with trans fats or high amounts of saturated fat.  Limit saturated fats. These are found in animal products, such as meats, butter, and cream. Plant sources of saturated fats include palm oil, palm kernel oil, and coconut oil.  Avoid foods with partially hydrogenated oils in them. These contain trans fats. Examples are stick margarine, some tub margarines, cookies, crackers, and other baked goods.  Avoid fried foods. General information  Eat more home-cooked food and less restaurant, buffet, and fast food.  Limit or avoid alcohol.  Limit foods that are high in starch and sugar.  Lose weight if you are overweight. Losing just 5-10% of your body weight can help your overall health and prevent diseases such as diabetes and heart disease.  Monitor your salt (sodium) intake, especially if you have high blood pressure. Talk with your health care provider about your sodium intake.  Try to incorporate more vegetarian meals weekly. What foods can I eat? Fruits All fresh, canned (in natural juice), or frozen fruits. Vegetables Fresh or frozen vegetables (raw, steamed, roasted, or grilled). Green salads. Grains Most grains. Choose whole wheat and whole grains most of the time. Rice and pasta, including brown rice and pastas made with whole wheat. Meats and other proteins Lean, well-trimmed beef, veal, pork, and lamb. Chicken and [place] without skin. All fish and shellfish. Wild duck, rabbit, pheasant, and venison. Egg whites or low-cholesterol egg substitutes. Dried beans, peas, lentils, and tofu. Seeds and most nuts. Dairy Low-fat or nonfat cheeses, including ricotta and mozzarella. Skim or 1% milk (liquid, powdered, or evaporated).  Buttermilk made with low-fat milk. Nonfat or low-fat yogurt. Fats and oils Non-hydrogenated (trans-free) margarines. Vegetable oils, including soybean, sesame, sunflower, olive, peanut, safflower, corn, canola, and cottonseed. Salad dressings or mayonnaise made with a vegetable oil. Beverages Water (mineral or sparkling). Coffee and tea. Diet carbonated beverages. Sweets and desserts Sherbet, gelatin, and fruit ice. Small amounts of dark chocolate. Limit all sweets and desserts. Seasonings and condiments All seasonings and condiments. The items listed above may not be a complete list of foods and beverages you can eat. Contact a dietitian for more options. What foods are not recommended? Fruits Canned fruit in heavy syrup. Fruit in cream or butter sauce. Fried fruit. Limit coconut. Vegetables Vegetables cooked in cheese, cream, or butter sauce. Fried vegetables. Grains Breads made with saturated or trans fats, oils, or whole milk. Croissants. Sweet rolls. Donuts. High-fat crackers, such as cheese crackers. Meats and  other proteins Fatty meats, such as hot dogs, ribs, sausage, bacon, rib-eye roast or steak. High-fat deli meats, such as salami and bologna. Caviar. Domestic duck and goose. Organ meats, such as liver. Dairy Cream, sour cream, cream cheese, and creamed cottage cheese. Whole milk cheeses. Whole or 2% milk (liquid, evaporated, or condensed). Whole buttermilk. Cream sauce or high-fat cheese sauce. Whole-milk yogurt. Fats and oils Meat fat, or shortening. Cocoa butter, hydrogenated oils, palm oil, coconut oil, palm kernel oil. Solid fats and shortenings, including bacon fat, salt pork, lard, and butter. Nondairy cream substitutes. Salad dressings with cheese or sour cream. Beverages Regular sodas and any drinks with added sugar. Sweets and desserts Frosting. Pudding. Cookies. Cakes. Pies. Milk chocolate or white chocolate. Buttered syrups. Full-fat ice cream or ice cream  drinks. The items listed above may not be a complete list of foods and beverages to avoid. Contact a dietitian for more information. Summary  Heart-healthy meal planning includes limiting unhealthy fats, increasing healthy fats, and making other diet and lifestyle changes.  Lose weight if you are overweight. Losing just 5-10% of your body weight can help your overall health and prevent diseases such as diabetes and heart disease.  Focus on eating a balance of foods, including fruits and vegetables, low-fat or nonfat dairy, lean protein, nuts and legumes, whole grains, and heart-healthy oils and fats. This information is not intended to replace advice given to you by your health care provider. Make sure you discuss any questions you have with your health care provider. Document Revised: 02/08/2017 Document Reviewed: 02/08/2017 Elsevier Patient Education  2020 Elsevier Inc.  Low-FODMAP Eating Plan  FODMAPs (fermentable oligosaccharides, disaccharides, monosaccharides, and polyols) are sugars that are hard for some people to digest. A low-FODMAP eating plan may help some people who have bowel (intestinal) diseases to manage their symptoms. This meal plan can be complicated to follow. Work with a diet and nutrition specialist (dietitian) to make a low-FODMAP eating plan that is right for you. A dietitian can make sure that you get enough nutrition from this diet. What are tips for following this plan? Reading food labels  Check labels for hidden FODMAPs such as: ? High-fructose syrup. ? Honey. ? Agave. ? Natural fruit flavors. ? Onion or garlic powder.  Choose low-FODMAP foods that contain 3-4 grams of fiber per serving.  Check food labels for serving sizes. Eat only one serving at a time to make sure FODMAP levels stay low. Meal planning  Follow a low-FODMAP eating plan for up to 6 weeks, or as told by your health care provider or dietitian.  To follow the eating plan: 1. Eliminate  high-FODMAP foods from your diet completely. 2. Gradually reintroduce high-FODMAP foods into your diet one at a time. Most people should wait a few days after introducing one high-FODMAP food before they introduce the next high-FODMAP food. Your dietitian can recommend how quickly you may reintroduce foods. 3. Keep a daily record of what you eat and drink, and make note of any symptoms that you have after eating. 4. Review your daily record with a dietitian regularly. Your dietitian can help you identify which foods you can eat and which foods you should avoid. General tips  Drink enough fluid each day to keep your urine pale yellow.  Avoid processed foods. These often have added sugar and may be high in FODMAPs.  Avoid most dairy products, whole grains, and sweeteners.  Work with a dietitian to make sure you get enough fiber in your  diet. Recommended foods Grains  Gluten-free grains, such as rice, oats, buckwheat, quinoa, corn, polenta, and millet. Gluten-free pasta, bread, or cereal. Rice noodles. Corn tortillas. Vegetables  Eggplant, zucchini, cucumber, peppers, green beans, Brussels sprouts, bean sprouts, lettuce, arugula, kale, Swiss chard, spinach, collard greens, bok choy, summer squash, potato, and tomato. Limited amounts of corn, carrot, and sweet potato. Green parts of scallions. Fruits  Bananas, oranges, lemons, limes, blueberries, raspberries, strawberries, grapes, cantaloupe, honeydew melon, kiwi, papaya, passion fruit, and pineapple. Limited amounts of dried cranberries, banana chips, and shredded coconut. Dairy  Lactose-free milk, yogurt, and kefir. Lactose-free cottage cheese and ice cream. Non-dairy milks, such as almond, coconut, hemp, and rice milk. Yogurts made of non-dairy milks. Limited amounts of goat cheese, brie, mozzarella, parmesan, swiss, and other hard cheeses. Meats and other protein foods  Unseasoned beef, pork, poultry, or fish. Eggs. [place] Salines. Tofu (firm) and  tempeh. Limited amounts of nuts and seeds, such as almonds, walnuts, [place] nuts, pecans, peanuts, pumpkin seeds, chia seeds, and sunflower seeds. Fats and oils  Butter-free spreads. Vegetable oils, such as olive, canola, and sunflower oil. Seasoning and other foods  Artificial sweeteners with names that do not end in "ol" such as aspartame, saccharine, and stevia. Maple syrup, white table sugar, raw sugar, brown sugar, and molasses. Fresh basil, coriander, parsley, rosemary, and thyme. Beverages  Water and mineral water. Sugar-sweetened soft drinks. Small amounts of orange juice or cranberry juice. Black and green tea. Most dry wines. Coffee. This may not be a complete list of low-FODMAP foods. Talk with your dietitian for more information. Foods to avoid Grains  Wheat, including kamut, durum, and semolina. Barley and bulgur. Couscous. Wheat-based cereals. Wheat noodles, bread, crackers, and pastries. Vegetables  Chicory root, artichoke, asparagus, cabbage, snow peas, sugar snap peas, mushrooms, and cauliflower. Onions, garlic, leeks, and the white part of scallions. Fruits  Fresh, dried, and juiced forms of apple, pear, watermelon, peach, plum, cherries, apricots, blackberries, boysenberries, figs, nectarines, and mango. Avocado. Dairy  Milk, yogurt, ice cream, and soft cheese. Cream and sour cream. Milk-based sauces. Custard. Meats and other protein foods  Fried or fatty meat. Sausage. Cashews and pistachios. Soybeans, baked beans, black beans, chickpeas, kidney beans, fava beans, navy beans, lentils, and split peas. Seasoning and other foods  Any sugar-free gum or candy. Foods that contain artificial sweeteners such as sorbitol, mannitol, isomalt, or xylitol. Foods that contain honey, high-fructose corn syrup, or agave. Bouillon, vegetable stock, beef stock, and chicken stock. Garlic and onion powder. Condiments made with onion, such as hummus, chutney, pickles, relish, salad  dressing, and salsa. Tomato paste. Beverages  Chicory-based drinks. Coffee substitutes. Chamomile tea. Fennel tea. Sweet or fortified wines such as port or sherry. Diet soft drinks made with isomalt, mannitol, maltitol, sorbitol, or xylitol. Apple, pear, and mango juice. Juices with high-fructose corn syrup. This may not be a complete list of high-FODMAP foods. Talk with your dietitian to discuss what dietary choices are best for you.  Summary  A low-FODMAP eating plan is a short-term diet that eliminates FODMAPs from your diet to help ease symptoms of certain bowel diseases.  The eating plan usually lasts up to 6 weeks. After that, high-FODMAP foods are restarted gradually, one at a time, so you can find out which may be causing symptoms.  A low-FODMAP eating plan can be complicated. It is best to work with a dietitian who has experience with this type of plan. This information is not intended to replace advice given to you  by your health care provider. Make sure you discuss any questions you have with your health care provider. Document Revised: 12/14/2016 Document Reviewed: 08/28/2016 Elsevier Patient Education  [place] refers to food and lifestyle choices that are based on the traditions of countries located on the [employer]. This way of eating has been shown to help prevent certain conditions and improve outcomes for people who have chronic diseases, like kidney disease and heart disease. What are tips for following this plan? Lifestyle  Cook and eat meals together with your family, when possible.  Drink enough fluid to keep your urine clear or pale yellow.  Be physically active every day. This includes: ? Aerobic exercise like running or swimming. ? Leisure activities like gardening, walking, or housework.  Get 7-8 hours of sleep each night.  If recommended by your health care provider, drink red wine in moderation.  This means 1 glass a day for nonpregnant women and 2 glasses a day for men. A glass of wine equals 5 oz (150 mL). Reading food labels   Check the serving size of packaged foods. For foods such as rice and pasta, the serving size refers to the amount of cooked product, not dry.  Check the total fat in packaged foods. Avoid foods that have saturated fat or trans fats.  Check the ingredients list for added sugars, such as corn syrup. Shopping  At the grocery store, buy most of your food from the areas near the walls of the store. This includes: ? Fresh fruits and vegetables (produce). ? Grains, beans, nuts, and seeds. Some of these may be available in unpackaged forms or large amounts (in bulk). ? Fresh seafood. ? Poultry and eggs. ? Low-fat dairy products.  Buy whole ingredients instead of prepackaged foods.  Buy fresh fruits and vegetables in-season from local farmers markets.  Buy frozen fruits and vegetables in resealable bags.  If you do not have access to quality fresh seafood, buy precooked frozen shrimp or canned fish, such as tuna, salmon, or sardines.  Buy small amounts of raw or cooked vegetables, salads, or olives from the deli or salad bar at your store.  Stock your pantry so you always have certain foods on hand, such as olive oil, canned tuna, canned tomatoes, rice, pasta, and beans. Cooking  Cook foods with extra-virgin olive oil instead of using butter or other vegetable oils.  Have meat as a side dish, and have vegetables or grains as your main dish. This means having meat in small portions or adding small amounts of meat to foods like pasta or stew.  Use beans or vegetables instead of meat in common dishes like chili or lasagna.  Experiment with different cooking methods. Try roasting or broiling vegetables instead of steaming or sauteing them.  Add frozen vegetables to soups, stews, pasta, or rice.  Add nuts or seeds for added healthy fat at each meal. You  can add these to yogurt, salads, or vegetable dishes.  Marinate fish or vegetables using olive oil, lemon juice, garlic, and fresh herbs. Meal planning   Plan to eat 1 vegetarian meal one day each week. Try to work up to 2 vegetarian meals, if possible.  Eat seafood 2 or more times a week.  Have healthy snacks readily available, such as: ? Vegetable sticks with hummus. ? [place] yogurt. ? Fruit and nut trail mix.  Eat balanced meals throughout the week. This includes: ? Fruit: 2-3 servings a day ?  Vegetables: 4-5 servings a day ? Low-fat dairy: 2 servings a day ? Fish, poultry, or lean meat: 1 serving a day ? Beans and legumes: 2 or more servings a week ? Nuts and seeds: 1-2 servings a day ? Whole grains: 6-8 servings a day ? Extra-virgin olive oil: 3-4 servings a day  Limit red meat and sweets to only a few servings a month What are my food choices?  Mediterranean diet ? Recommended  Grains: Whole-grain pasta. Brown rice. [place] wheat. Polenta. Couscous. Whole-wheat bread. Orpah Cobb.  Vegetables: Artichokes. Beets. Broccoli. Cabbage. Carrots. Eggplant. Green beans. Chard. Kale. Spinach. Onions. Leeks. Peas. Squash. Tomatoes. Peppers. Radishes.  Fruits: Apples. Apricots. Avocado. Berries. Bananas. Cherries. Dates. Figs. Grapes. Lemons. Melon. Oranges. Peaches. Plums. Pomegranate.  Meats and other protein foods: Beans. Almonds. Sunflower seeds. Pine nuts. Peanuts. Cod. Salmon. Scallops. Shrimp. Tuna. Tilapia. Clams. Oysters. Eggs.  Dairy: Low-fat milk. Cheese. Greek yogurt.  Beverages: Water. Red wine. Herbal tea.  Fats and oils: Extra virgin olive oil. Avocado oil. Grape seed oil.  Sweets and desserts: [place] yogurt with honey. Baked apples. Poached pears. Trail mix.  Seasoning and other foods: Basil. Cilantro. Coriander. Cumin. Mint. Parsley. Sage. Rosemary. Tarragon. Garlic. Oregano. Thyme. Pepper. Balsalmic vinegar. Tahini. Hummus. Tomato sauce. Olives.  Mushrooms. ? Limit these  Grains: Prepackaged pasta or rice dishes. Prepackaged cereal with added sugar.  Vegetables: Deep fried potatoes (french fries).  Fruits: Fruit canned in syrup.  Meats and other protein foods: Beef. Pork. Lamb. Poultry with skin. Hot dogs. Tomasa Blase.  Dairy: Ice cream. Sour cream. Whole milk.  Beverages: Juice. Sugar-sweetened soft drinks. Beer. Liquor and spirits.  Fats and oils: Butter. Canola oil. Vegetable oil. Beef fat (tallow). Lard.  Sweets and desserts: Cookies. Cakes. Pies. Candy.  Seasoning and other foods: Mayonnaise. Premade sauces and marinades. The items listed may not be a complete list. Talk with your dietitian about what dietary choices are right for you. Summary  The Mediterranean diet includes both food and lifestyle choices.  Eat a variety of fresh fruits and vegetables, beans, nuts, seeds, and whole grains.  Limit the amount of red meat and sweets that you eat.  Talk with your health care provider about whether it is safe for you to drink red wine in moderation. This means 1 glass a day for nonpregnant women and 2 glasses a day for men. A glass of wine equals 5 oz (150 mL). This information is not intended to replace advice given to you by your health care provider. Make sure you discuss any questions you have with your health care provider. Document Revised: 09/01/2015 Document Reviewed: 08/25/2015 Elsevier Patient Education  2020 Elsevier Inc.  Preventing Type 2 Diabetes Mellitus Type 2 diabetes (type 2 diabetes mellitus) is a long-term (chronic) disease that affects blood sugar (glucose) levels. Normally, a hormone called insulin allows glucose to enter cells in the body. The cells use glucose for energy. In type 2 diabetes, one or both of these problems may be present:  The body does not make enough insulin.  The body does not respond properly to insulin that it makes (insulin resistance). Insulin resistance or lack of insulin  causes excess glucose to build up in the blood instead of going into cells. As a result, high blood glucose (hyperglycemia) develops, which can cause many complications. Being overweight or obese and having an inactive (sedentary) lifestyle can increase your risk for diabetes. Type 2 diabetes can be delayed or prevented by making certain nutrition and lifestyle changes.  What nutrition changes can be made?   Eat healthy meals and snacks regularly. Keep a healthy snack with you for when you get hungry between meals, such as fruit or a handful of nuts.  Eat lean meats and proteins that are low in saturated fats, such as chicken, fish, egg whites, and beans. Avoid processed meats.  Eat plenty of fruits and vegetables and plenty of grains that have not been processed (whole grains). It is recommended that you eat: ? 1?2 cups of fruit every day. ? 2?3 cups of vegetables every day. ? 6?8 oz of whole grains every day, such as oats, whole wheat, bulgur, brown rice, quinoa, and millet.  Eat low-fat dairy products, such as milk, yogurt, and cheese.  Eat foods that contain healthy fats, such as nuts, avocado, olive oil, and canola oil.  Drink water throughout the day. Avoid drinks that contain added sugar, such as soda or sweet tea.  Follow instructions from your health care provider about specific eating or drinking restrictions.  Control how much food you eat at a time (portion size). ? Check food labels to find out the serving sizes of foods. ? Use a kitchen scale to weigh amounts of foods.  Saute or steam food instead of frying it. Cook with water or broth instead of oils or butter.  Limit your intake of: ? Salt (sodium). Have no more than 1 tsp (2,400 mg) of sodium a day. If you have heart disease or high blood pressure, have less than ? tsp (1,500 mg) of sodium a day. ? Saturated fat. This is fat that is solid at room temperature, such as butter or fat on meat. What lifestyle changes can  be made? Activity   Do moderate-intensity physical activity for at least 30 minutes on at least 5 days of the week, or as much as told by your health care provider.  Ask your health care provider what activities are safe for you. A mix of physical activities may be best, such as walking, swimming, cycling, and strength training.  Try to add physical activity into your day. For example: ? Park in spots that are farther away than usual, so that you walk more. For example, park in a far corner of the parking lot when you go to the office or the grocery store. ? Take a walk during your lunch break. ? Use stairs instead of elevators or escalators. Weight Loss  Lose weight as directed. Your health care provider can determine how much weight loss is best for you and can help you lose weight safely.  If you are overweight or obese, you may be instructed to lose at least 5?7 % of your body weight. Alcohol and Tobacco   Limit alcohol intake to no more than 1 drink a day for nonpregnant women and 2 drinks a day for men. One drink equals 12 oz of beer, 5 oz of wine, or 1 oz of hard liquor.  Do not use any tobacco products, such as cigarettes, chewing tobacco, and e-cigarettes. If you need help quitting, ask your health care provider. Work With Your Health Care Provider  Have your blood glucose tested regularly, as told by your health care provider.  Discuss your risk factors and how you can reduce your risk for diabetes.  Get screening tests as told by your health care provider. You may have screening tests regularly, especially if you have certain risk factors for type 2 diabetes.  Make an appointment with a diet and  nutrition specialist (registered dietitian). A registered dietitian can help you make a healthy eating plan and can help you understand portion sizes and food labels. Why are these changes important?  It is possible to prevent or delay type 2 diabetes and related health problems  by making lifestyle and nutrition changes.  It can be difficult to recognize signs of type 2 diabetes. The best way to avoid possible damage to your body is to take actions to prevent the disease before you develop symptoms. What can happen if changes are not made?  Your blood glucose levels may keep increasing. Having high blood glucose for a long time is dangerous. Too much glucose in your blood can damage your blood vessels, heart, kidneys, nerves, and eyes.  You may develop prediabetes or type 2 diabetes. Type 2 diabetes can lead to many chronic health problems and complications, such as: ? Heart disease. ? Stroke. ? Blindness. ? Kidney disease. ? Depression. ? Poor circulation in the feet and legs, which could lead to surgical removal (amputation) in severe cases. Where to find support  Ask your health care provider to recommend a registered dietitian, diabetes educator, or weight loss program.  Look for local or online weight loss groups.  Join a gym, fitness club, or outdoor activity group, such as a walking club. Where to find more information To learn more about diabetes and diabetes prevention, visit:  American Diabetes Association (ADA): www.diabetes.[employer] of Diabetes and Digestive and Kidney Diseases: FindSpin.nl To learn more about healthy eating, visit:  The U.S. Department of Agriculture Scientist, research (physical sciences)), Choose My Plate: http://wiley-williams.com/  Office of Disease Prevention and Health Promotion (ODPHP), Dietary Guidelines: SurferLive.at Summary  You can reduce your risk for type 2 diabetes by increasing your physical activity, eating healthy foods, and losing weight as directed.  Talk with your health care provider about your risk for type 2 diabetes. Ask about any blood tests or screening tests that you need to have. This information is not intended to replace advice given to you by your  health care provider. Make sure you discuss any questions you have with your health care provider. Document Revised: 04/25/2018 Document Reviewed: 02/22/2015 Elsevier Patient Education  [place].

## 2019-12-14 NOTE — [facility] Notes (Signed)
Established Patient Office Visit  Subjective:  Patient ID: [name], female    DOB: Sep 08, 1945  Age: 74 y.o. MRN: [id number]  CC:  Chief Complaint  Patient presents with   Follow-up    Patient is here for follow up for her diabetes    HPI [name] presents for t2dm follow up   Feeling well. Sugars still labile. Diet has been so - so , patient admits habits have slipped over the years. Regularly active No symptoms of complications  Past Medical History:  Diagnosis Date   COPD (chronic obstructive pulmonary disease) (HCC)    Diabetes mellitus    Hyperlipidemia    Hypertension    Night sweats    Rectal polyp     Past Surgical History:  Procedure Laterality Date   ABDOMINAL HYSTERECTOMY  2000   fibroids; ovaries intact.   BACK SURGERY     plate put in back   CHOLECYSTECTOMY N/A 05/01/2017   Procedure: LAPAROSCOPIC CHOLECYSTECTOMY WITH INTRAOPERATIVE CHOLANGIOGRAM;  Surgeon: Donnie Mesa, MD;  Location: [facility];  Service: General;  Laterality: N/A;   RECTAL POLYPECTOMY  05/16/2010   TVAdenoma polyp   TUBAL LIGATION      Family History  Problem Relation Age of Onset   Diabetes Sister    Diabetes Brother    Hypertension Brother    Cancer Mother 8       colon(age 66) and lung (age 32)   Diabetes Mother    Heart disease Mother 23       CABG   Hyperlipidemia Mother    Cancer Father 60       stomach   Diabetes Maternal Grandmother    Mental illness Sister     Social History   Socioeconomic History   Marital status: Divorced    Spouse name: Not on file   Number of children: 3   Years of education: Not on file   Highest education level: Not on file  Occupational History   Occupation: retired    Comment: [employer]  Tobacco Use   Smoking status: Current Every Day Smoker    Packs/day: 0.50    Years: 50.00    Pack years: 25.00    Types: Cigarettes   Smokeless tobacco: Never Used  Brewing technologist Use: Never used  Substance and Sexual Activity   Alcohol use: No   Drug use: No   Sexual activity: Not on file  Other Topics Concern   Not on file  Social History Narrative   Marital status: divorced; not dating in 2019 but interested slightly.        Children:  3 children; 7 seven grandchildren; 1 gg      Lives: with oldest daughter, 2 grandchildren, 69 gg, 1 friend of daughters, daughter's boyfriend.       Employment: retired [employer] age 22.      Tobacco; 1ppd x 50 years.  Not interested in 2019.      Alcohol: none      Exercise: none in 2019. [place]      ADLs:   independent with ADLs; no assistant devices      Advanced Directives:  None; DNR/DNI; no HCPOA.    Social Determinants of Health   Financial Resource Strain:    Difficulty of Paying Living Expenses: Not on file  Food Insecurity:    Worried About [place] in the Last Year: Not on file  Ran Out of Food in the Last Year: Not on file  Transportation Needs:    Lack of Transportation (Medical): Not on file   Lack of Transportation (Non-Medical): Not on file  Physical Activity:    Days of Exercise per Week: Not on file   Minutes of Exercise per Session: Not on file  Stress:    Feeling of Stress : Not on file  Social Connections:    Frequency of Communication with Friends and Family: Not on file   Frequency of Social Gatherings with Friends and Family: Not on file   Attends Religious Services: Not on file   Active Member of [place] or Organizations: Not on file   Attends Archivist Meetings: Not on file   Marital Status: Not on file  Intimate Partner Violence:    Fear of Current or Ex-Partner: Not on file   Emotionally Abused: Not on file   Physically Abused: Not on file   Sexually Abused: Not on file    Outpatient Medications Prior to Visit  Medication Sig Dispense Refill   ACCU-CHEK GUIDE test strip USE UP TO 4 TIMES DAILY AS  DIRECTED  200 strip 6   Accu-Chek Softclix Lancets lancets USE UP TO 4 TIMES DAILY AS  DIRECTED 200 each 6   aspirin 81 MG tablet Take 81 mg by mouth daily.       blood glucose meter kit and supplies KIT Dispense based on patient and insurance preference. Use up to four times daily as directed. (FOR ICD-9 250.00, 250.01). 1 each 0   blood glucose meter kit and supplies Dispense based on insurance preference. Use up to four times daily as directed. (FOR ICD-10 E11.9 E11.65) AccuCheck Meter    1 each 0   BREO ELLIPTA 100-25 MCG/INH AEPB USE 1 INHALATION BY MOUTH  ONCE DAILY AT THE SAME TIME EACH DAY 180 each 3   Cholecalciferol (VITAMIN D3) 1000 UNITS CAPS Take 2 capsules by mouth every morning.      insulin glargine (LANTUS SOLOSTAR) 100 UNIT/ML Solostar Pen INJECT SUBCUTANEOUSLY 55  UNITS DAILY AT 10PM 45 mL 3   Insulin Pen Needle (BD PEN NEEDLE NANO U/F) 32G X 4 MM MISC USE AS DIRECTED 90 each 1   JANUMET 50-1000 MG tablet TAKE 1 TABLET BY MOUTH  TWICE DAILY WITH A MEAL 180 tablet 1   lisinopril (ZESTRIL) 20 MG tablet Take 1 tablet (20 mg total) by mouth daily. 90 tablet 2   metoprolol succinate (TOPROL-XL) 25 MG 24 hr tablet TAKE 1 TABLET BY MOUTH  DAILY 90 tablet 3   pravastatin (PRAVACHOL) 40 MG tablet TAKE 1 TABLET BY MOUTH IN  THE EVENING AFTER A MEAL 90 tablet 3   SPIRIVA HANDIHALER 18 MCG inhalation capsule INHALE THE CONTENTS OF 1  CAPSULE BY MOUTH VIA  HANDIHALER DAILY 90 capsule 3   ibuprofen (ADVIL) 800 MG tablet Take 1 tablet (800 mg total) by mouth every 8 (eight) hours as needed. (Patient not taking: Reported on 09/09/2019) 30 tablet 0   metFORMIN (GLUCOPHAGE) 1000 MG tablet TAKE 1 TABLET BY MOUTH  TWICE DAILY WITH A MEAL (Patient not taking: Reported on 12/14/2019) 180 tablet 1   No facility-administered medications prior to visit.    Allergies  Allergen Reactions   Colestipol Diarrhea    Diarrhea, gas, stomach cramps     ROS Review of Systems  Constitutional:  Negative.   HENT: Negative.   Eyes: Negative.   Respiratory: Negative.   Cardiovascular: Negative.  Gastrointestinal: Negative.   Genitourinary: Negative.   Musculoskeletal: Negative.   Skin: Negative.   Neurological: Negative.   Psychiatric/Behavioral: Negative.       Objective:    Physical Exam Vitals and nursing note reviewed.  Constitutional:      General: She is not in acute distress.    Appearance: Normal appearance. She is normal weight. She is not ill-appearing, toxic-appearing or diaphoretic.  Cardiovascular:     Rate and Rhythm: Normal rate and regular rhythm.     Heart sounds: Normal heart sounds. No murmur heard.  No friction rub. No gallop.   Pulmonary:     Effort: Pulmonary effort is normal. No respiratory distress.     Breath sounds: Normal breath sounds. No stridor. No wheezing, rhonchi or rales.  Chest:     Chest wall: No tenderness.  Skin:    General: Skin is warm and dry.  Neurological:     General: No focal deficit present.     Mental Status: She is alert and oriented to person, place, and time. Mental status is at baseline.  Psychiatric:        Mood and Affect: Mood normal.        Behavior: Behavior normal.        Thought Content: Thought content normal.        Judgment: Judgment normal.     BP (!) 146/82    Pulse (!) 101    Temp 98 F (36.7 C) (Temporal)    Resp 18    Ht _0  (1.702 m)    Wt 175 lb (79.4 kg)    SpO2 94%    BMI 27.41 kg/m  Wt Readings from Last 3 Encounters:  12/14/19 175 lb (79.4 kg)  09/09/19 173 lb 12.8 oz (78.8 kg)  05/13/19 174 lb 3.2 oz (79 kg)     There are no preventive care reminders to display for this patient.  There are no preventive care reminders to display for this patient.  Lab Results  Component Value Date   TSH 0.940 07/24/2017   Lab Results  Component Value Date   WBC 9.2 07/24/2017   HGB 14.5 07/24/2017   HCT 46.4 07/24/2017   MCV 86 07/24/2017   PLT 358 07/24/2017   Lab Results    Component Value Date   NA 138 05/13/2019   K 4.6 05/13/2019   CO2 20 05/13/2019   GLUCOSE 194 (H) 05/13/2019   BUN 10 05/13/2019   CREATININE 0.88 05/13/2019   BILITOT <0.2 05/13/2019   ALKPHOS 135 (H) 05/13/2019   AST 14 05/13/2019   ALT 10 05/13/2019   PROT 6.7 05/13/2019   ALBUMIN 4.1 05/13/2019   CALCIUM 9.8 05/13/2019   ANIONGAP 13 04/17/2017   Lab Results  Component Value Date   CHOL 135 10/14/2018   Lab Results  Component Value Date   HDL 50 10/14/2018   Lab Results  Component Value Date   LDLCALC 68 10/14/2018   Lab Results  Component Value Date   TRIG 92 10/14/2018   Lab Results  Component Value Date   CHOLHDL 2.7 10/14/2018   Lab Results  Component Value Date   HGBA1C 8.9 (A) 12/14/2019      Assessment & Plan:   Problem List Items Addressed This Visit    None    Visit Diagnoses    Type 2 diabetes mellitus with hyperglycemia, with long-term current use of insulin ([place])    -  Primary   Relevant Medications  dapagliflozin propanediol (FARXIGA) 5 MG TABS tablet   Other Relevant Orders   POCT glycosylated hemoglobin (Hb A1C) (Completed)      No orders of the defined types were placed in this encounter.   Follow-up: No follow-ups on file.   PLAN  A1c down a little - but still very high at 8.9  Pt needs better diet and exercise control  Start farxiga 50m PO qd  Return in 3 mo  Patient encouraged to call clinic with any questions, comments, or concerns.  RMaximiano Coss NP

## 2019-12-28 ENCOUNTER — Other Ambulatory Visit: Payer: Self-pay | Admitting: *Deleted

## 2019-12-28 MED ORDER — BREO ELLIPTA 100-25 MCG/INH IN AEPB: INHALATION_SPRAY | RESPIRATORY_TRACT | 3 refills | Status: DC

## 2020-02-09 ENCOUNTER — Other Ambulatory Visit: Payer: Self-pay | Admitting: Registered Nurse

## 2020-02-09 MED ORDER — PRAVASTATIN SODIUM 40 MG PO TABS: ORAL_TABLET | ORAL | 0 refills | Status: DC

## 2020-02-09 MED ORDER — LISINOPRIL 20 MG PO TABS
20.0000 mg | ORAL_TABLET | Freq: Every day | ORAL | 0 refills | Status: DC
Start: 2020-02-09 — End: 2020-03-25

## 2020-02-09 NOTE — Telephone Encounter (Signed)
Medication Refill - Medication: * lisinopril (ZESTRIL) 20 MG tablet 90 tablet 2 05/13/2019   pravastatin (PRAVACHOL) 40 MG tablet 90 tablet 3 03/12/2019     Has the patient contacted their pharmacy?yes (Agent: If no, request that the patient contact the pharmacy for the refill.) (Agent: If yes, when and what did the pharmacy advise?)  Preferred Pharmacy (with phone number or street name): [facility], [address], [address] Phone:  [phone]  Fax:  [phone]      Agent: Please be advised that RX refills may take up to 3 business days. We ask that you follow-up with your pharmacy.

## 2020-02-16 ENCOUNTER — Telehealth: Payer: Self-pay | Admitting: Registered Nurse

## 2020-02-16 NOTE — Telephone Encounter (Signed)
Pt called stated she left a message with our office yesterday 1/31 regarding her sugar levels dropping. There was not a message put in regarding this matter. I sch pt for a OV for tomorrow 2/2 with provider.

## 2020-02-17 ENCOUNTER — Other Ambulatory Visit: Payer: Self-pay

## 2020-02-17 ENCOUNTER — Encounter: Payer: Self-pay | Admitting: Registered Nurse

## 2020-02-17 ENCOUNTER — Ambulatory Visit (INDEPENDENT_AMBULATORY_CARE_PROVIDER_SITE_OTHER): Payer: Medicare Other | Admitting: Registered Nurse

## 2020-02-17 VITALS — BP 185/80 | HR 87 | Temp 97.9°F | Resp 18 | Ht 67.0 in | Wt 176.0 lb

## 2020-02-17 DIAGNOSIS — E162 Hypoglycemia, unspecified: Secondary | ICD-10-CM

## 2020-02-17 LAB — POCT GLYCOSYLATED HEMOGLOBIN (HGB A1C): Hemoglobin A1C: 7.7 % — AB (ref 4.0–5.6)

## 2020-02-17 LAB — GLUCOSE, POCT (MANUAL RESULT ENTRY): POC Glucose: 145 mg/dl — AB (ref 70–99)

## 2020-02-17 NOTE — Patient Instructions (Signed)
° ° ° °  If you have lab work done today you will be contacted with your lab results within the next 2 weeks.  If you have not heard from us then please contact us. The fastest way to get your results is to register for My Chart. ° ° °IF you received an x-ray today, you will receive an invoice from [facility]. Please contact [facility] at [phone] with questions or concerns regarding your invoice.  ° °IF you received labwork today, you will receive an invoice from [facility]. Please contact [facility] at [phone] with questions or concerns regarding your invoice.  ° °Our billing staff will not be able to assist you with questions regarding bills from these companies. ° °You will be contacted with the lab results as soon as they are available. The fastest way to get your results is to activate your My Chart account. Instructions are located on the last page of this paperwork. If you have not heard from us regarding the results in 2 weeks, please contact this office. °  ° ° ° °

## 2020-03-09 ENCOUNTER — Other Ambulatory Visit: Payer: Self-pay | Admitting: Registered Nurse

## 2020-03-09 DIAGNOSIS — E1165 Type 2 diabetes mellitus with hyperglycemia: Secondary | ICD-10-CM

## 2020-03-09 DIAGNOSIS — E119 Type 2 diabetes mellitus without complications: Secondary | ICD-10-CM

## 2020-03-11 ENCOUNTER — Other Ambulatory Visit: Payer: Self-pay | Admitting: Registered Nurse

## 2020-03-11 DIAGNOSIS — E1165 Type 2 diabetes mellitus with hyperglycemia: Secondary | ICD-10-CM

## 2020-03-11 DIAGNOSIS — Z794 Long term (current) use of insulin: Secondary | ICD-10-CM

## 2020-03-11 NOTE — Telephone Encounter (Signed)
Requested medications are due for refill today yes  Requested medications are on the active medication list yes  Last refill 11/29  Last visit 02/17/20   Notes to clinic Last visit says  it is for hypoglycemia, there are no notes on the visit, just making sure there were no changes to this med, please assess.

## 2020-03-15 ENCOUNTER — Ambulatory Visit: Payer: Medicare Other | Admitting: Registered Nurse

## 2020-03-15 NOTE — Telephone Encounter (Signed)
No action needed at this time, closing open encounter from 08/24/19

## 2020-03-25 ENCOUNTER — Other Ambulatory Visit: Payer: Self-pay | Admitting: Registered Nurse

## 2020-03-25 DIAGNOSIS — E1165 Type 2 diabetes mellitus with hyperglycemia: Secondary | ICD-10-CM

## 2020-03-25 DIAGNOSIS — Z794 Long term (current) use of insulin: Secondary | ICD-10-CM

## 2020-03-25 NOTE — Telephone Encounter (Signed)
Requested Prescriptions  Pending Prescriptions Disp Refills  . JANUMET 50-1000 MG tablet [Pharmacy Med Name: JANUMET  $Remove'50MG'wdVhGfL$'1000MG'$   TAB] 180 tablet 3    Sig: TAKE 1 TABLET BY MOUTH  TWICE DAILY WITH A MEAL     Endocrinology:  Diabetes - Biguanide + DPP-4 Inhibitor Combos Passed - 03/25/2020  1:04 PM      Passed - HBA1C is between 0 and 7.9 and within 180 days    Hemoglobin A1C  Date Value Ref Range Status  02/17/2020 7.7 (A) 4.0 - 5.6 % Final   Hgb A1c MFr Bld  Date Value Ref Range Status  10/14/2018 9.8 (H) 4.8 - 5.6 % Final    Comment:             Prediabetes: 5.7 - 6.4          Diabetes: >6.4          Glycemic control for adults with diabetes: <7.0          Passed - Cr in normal range and within 360 days    Creat  Date Value Ref Range Status  08/31/2015 1.14 (H) 0.60 - 0.93 mg/dL Final    Comment:      For patients > or = 76 years of age: The upper reference limit for Creatinine is approximately 13% higher for people identified as African-American.      Creatinine, Ser  Date Value Ref Range Status  05/13/2019 0.88 0.57 - 1.00 mg/dL Final         Passed - AA eGFR in normal range and within 360 days    GFR, Est African American  Date Value Ref Range Status  09/21/2014 66 >=60 mL/min Final   GFR calc Af Amer  Date Value Ref Range Status  05/13/2019 75 >59 mL/min/1.73 Final    Comment:    **[facility] currently reports eGFR in compliance with the current**   recommendations of the [employer]. [facility] will   update reporting as new guidelines are published from the NKF-ASN   Task force.    GFR, Est Non African American  Date Value Ref Range Status  09/21/2014 57 (L) >=60 mL/min Final    Comment:      The estimated GFR is a calculation valid for adults (>=64 years old) that uses the CKD-EPI algorithm to adjust for age and sex. It is   not to be used for children, pregnant women, hospitalized patients,    patients on dialysis, or with rapidly  changing kidney function. According to the NKDEP, eGFR >89 is normal, 60-89 shows mild impairment, 30-59 shows moderate impairment, 15-29 shows severe impairment and <15 is ESRD.      GFR calc non Af Amer  Date Value Ref Range Status  05/13/2019 65 >59 mL/min/1.73 Final         Passed - Valid encounter within last 6 months    Recent Outpatient Visits          1 month ago Hypoglycemia   [facility], [place], NP   3 months ago Type 2 diabetes mellitus with hyperglycemia, with long-term current use of insulin [facility])   [facility], Delfino Lovett, NP   6 months ago Type 2 diabetes mellitus with hyperglycemia, with long-term current use of insulin [facility])   [facility], Delfino Lovett, NP   10 months ago Type 2 diabetes mellitus with hyperglycemia, with long-term current use of insulin [facility])   [facility]  at [facility], Arlie Solomons, MD   1 year ago Acute pain of right shoulder   [facility] at [facility], Arlie Solomons, MD      Future Appointments            In 4 weeks Camillia Herter, NP Primary Care at [facility]           . pravastatin (PRAVACHOL) 40 MG tablet [Pharmacy Med Name: Pravastatin Sodium 40 MG Oral Tablet] 90 tablet 3    Sig: TAKE 1 TABLET BY MOUTH IN  THE EVENING AFTER A MEAL     Cardiovascular:  Antilipid - Statins Failed - 03/25/2020  1:04 PM      Failed - Total Cholesterol in normal range and within 360 days    Cholesterol, Total  Date Value Ref Range Status  10/14/2018 135 100 - 199 mg/dL Final         Failed - LDL in normal range and within 360 days    LDL Chol Calc (NIH)  Date Value Ref Range Status  10/14/2018 68 0 - 99 mg/dL Final   Direct LDL  Date Value Ref Range Status  04/17/2012 85 mg/dL Final    Comment:    ATP III Classification (LDL):       < 100        mg/dL         Optimal      100 - 129     mg/dL         Near or Above Optimal      130 - 159     mg/dL         Borderline High      160 - 189      mg/dL         High       > 190        mg/dL         Very High           Failed - HDL in normal range and within 360 days    HDL  Date Value Ref Range Status  10/14/2018 50 >39 mg/dL Final         Failed - Triglycerides in normal range and within 360 days    Triglycerides  Date Value Ref Range Status  10/14/2018 92 0 - 149 mg/dL Final         Passed - Patient is not pregnant      Passed - Valid encounter within last 12 months    Recent Outpatient Visits          1 month ago Hypoglycemia   [facility], [place], NP   3 months ago Type 2 diabetes mellitus with hyperglycemia, with long-term current use of insulin ([place])   [facility] at [facility], [place], NP   6 months ago Type 2 diabetes mellitus with hyperglycemia, with long-term current use of insulin ([place])   [facility] at [facility], [place], NP   10 months ago Type 2 diabetes mellitus with hyperglycemia, with long-term current use of insulin ([place])   [facility] at [place], [place]   1 year ago Acute pain of right shoulder   Primary Care at [facility], Arlie Solomons, MD      Future Appointments            In 4 weeks Camillia Herter, NP Primary Care at [facility]           .  lisinopril (ZESTRIL) 20 MG tablet [Pharmacy Med Name: Lisinopril 20 MG Oral Tablet] 90 tablet 3    Sig: TAKE 1 TABLET BY MOUTH  DAILY     Cardiovascular:  ACE Inhibitors Failed - 03/25/2020  1:04 PM      Failed - Cr in normal range and within 180 days    Creat  Date Value Ref Range Status  08/31/2015 1.14 (H) 0.60 - 0.93 mg/dL Final    Comment:      For patients > or = 76 years of age: The upper reference limit for Creatinine is approximately 13% higher for people identified as African-American.      Creatinine, Ser  Date Value Ref Range Status  05/13/2019 0.88 0.57 - 1.00 mg/dL Final         Failed - K in normal range and within 180 days    Potassium  Date Value Ref Range Status   05/13/2019 4.6 3.5 - 5.2 mmol/L Final         Failed - Last BP in normal range    BP Readings from Last 1 Encounters:  02/17/20 (!) 185/80         Passed - Patient is not pregnant      Passed - Valid encounter within last 6 months    Recent Outpatient Visits          1 month ago Hypoglycemia   [facility], [place], NP   3 months ago Type 2 diabetes mellitus with hyperglycemia, with long-term current use of insulin ([place])   [facility] at [facility], Delfino Lovett, NP   6 months ago Type 2 diabetes mellitus with hyperglycemia, with long-term current use of insulin ([place])   [facility] at [facility], [place], NP   10 months ago Type 2 diabetes mellitus with hyperglycemia, with long-term current use of insulin [facility])   Primary Care at [place], MD   1 year ago Acute pain of right shoulder   [facility] Care at [place], MD      Future Appointments            In 4 weeks Camillia Herter, NP Primary Care at [facility]

## 2020-04-22 ENCOUNTER — Other Ambulatory Visit: Payer: Self-pay

## 2020-04-22 ENCOUNTER — Encounter: Payer: Self-pay | Admitting: Family

## 2020-04-22 ENCOUNTER — Ambulatory Visit (INDEPENDENT_AMBULATORY_CARE_PROVIDER_SITE_OTHER): Payer: Medicare Other | Admitting: Family

## 2020-04-22 VITALS — BP 165/88 | HR 85 | Ht 67.01 in | Wt 172.4 lb

## 2020-04-22 DIAGNOSIS — Z7689 Persons encountering health services in other specified circumstances: Secondary | ICD-10-CM | POA: Diagnosis not present

## 2020-04-22 DIAGNOSIS — E119 Type 2 diabetes mellitus without complications: Secondary | ICD-10-CM | POA: Diagnosis not present

## 2020-04-22 DIAGNOSIS — I10 Essential (primary) hypertension: Secondary | ICD-10-CM | POA: Diagnosis not present

## 2020-04-22 NOTE — Patient Instructions (Addendum)
Return for annual physical examination, labs, and health maintenance. Arrive fasting meaning having had no food and/or nothing to drink for at least 8 hours prior to appointment.  Please take scheduled medications as normal.  Increase Lisinopril for high blood pressure.   Continue Metoprolol for high blood pressure.   Return in 1 week for blood pressure check. Blood pressure goal 140/90 or less. Bring your home blood pressure monitor to your visit.    Continue Janumet for diabetes.  Thank you for choosing Primary Care at [facility] for your medical home!    [name] was seen by Camillia Herter, NP today.   [name]'s primary care provider is Camillia Herter, NP.   For the best care possible,  you should try to see Durene Fruits, NP whenever you come to clinic.   We look forward to seeing you again soon!  If you have any questions about your visit today,  please call us at [phone]  Or feel free to reach your provider via [place].    Hypertension, Adult Hypertension is another name for high blood pressure. High blood pressure forces your heart to work harder to pump blood. This can cause problems over time. There are two numbers in a blood pressure reading. There is a top number (systolic) over a bottom number (diastolic). It is best to have a blood pressure that is below 120/80. Healthy choices can help lower your blood pressure, or you may need medicine to help lower it. What are the causes? The cause of this condition is not known. Some conditions may be related to high blood pressure. What increases the risk?  Smoking.  Having type 2 diabetes mellitus, high cholesterol, or both.  Not getting enough exercise or physical activity.  Being overweight.  Having too much fat, sugar, calories, or salt (sodium) in your diet.  Drinking too much alcohol.  Having long-term (chronic) kidney disease.  Having a family history of high blood  pressure.  Age. Risk increases with age.  Race. You may be at higher risk if you are African American.  Gender. Men are at higher risk than women before age 24. After age 10, women are at higher risk than men.  Having obstructive sleep apnea.  Stress. What are the signs or symptoms?  High blood pressure may not cause symptoms. Very high blood pressure (hypertensive crisis) may cause: ? Headache. ? Feelings of worry or nervousness (anxiety). ? Shortness of breath. ? Nosebleed. ? A feeling of being sick to your stomach (nausea). ? Throwing up (vomiting). ? Changes in how you see. ? Very bad chest pain. ? Seizures. How is this treated?  This condition is treated by making healthy lifestyle changes, such as: ? Eating healthy foods. ? Exercising more. ? Drinking less alcohol.  Your health care provider may prescribe medicine if lifestyle changes are not enough to get your blood pressure under control, and if: ? Your top number is above 130. ? Your bottom number is above 80.  Your personal target blood pressure may vary. Follow these instructions at home: Eating and drinking  If told, follow the DASH eating plan. To follow this plan: ? Fill one half of your plate at each meal with fruits and vegetables. ? Fill one fourth of your plate at each meal with whole grains. Whole grains include whole-wheat pasta, brown rice, and whole-grain bread. ? Eat or drink low-fat dairy products, such as skim milk or low-fat yogurt. ? Fill  one fourth of your plate at each meal with low-fat (lean) proteins. Low-fat proteins include fish, chicken without skin, eggs, beans, and tofu. ? Avoid fatty meat, cured and processed meat, or chicken with skin. ? Avoid pre-made or processed food.  Eat less than 1,500 mg of salt each day.  Do not drink alcohol if: ? Your doctor tells you not to drink. ? You are pregnant, may be pregnant, or are planning to become pregnant.  If you drink alcohol: ? Limit  how much you use to:  0-1 drink a day for women.  0-2 drinks a day for men. ? Be aware of how much alcohol is in your drink. In the [place] one drink equals one 12 oz bottle of beer (355 mL), one 5 oz glass of wine (148 mL), or one 1 oz glass of hard liquor (44 mL).   Lifestyle  Work with your doctor to stay at a healthy weight or to lose weight. Ask your doctor what the best weight is for you.  Get at least 30 minutes of exercise most days of the week. This may include walking, swimming, or biking.  Get at least 30 minutes of exercise that strengthens your muscles (resistance exercise) at least 3 days a week. This may include lifting weights or doing Pilates.  Do not use any products that contain nicotine or tobacco, such as cigarettes, e-cigarettes, and chewing tobacco. If you need help quitting, ask your doctor.  Check your blood pressure at home as told by your doctor.  Keep all follow-up visits as told by your doctor. This is important.   Medicines  Take over-the-counter and prescription medicines only as told by your doctor. Follow directions carefully.  Do not skip doses of blood pressure medicine. The medicine does not work as well if you skip doses. Skipping doses also puts you at risk for problems.  Ask your doctor about side effects or reactions to medicines that you should watch for. Contact a doctor if you:  Think you are having a reaction to the medicine you are taking.  Have headaches that keep coming back (recurring).  Feel dizzy.  Have swelling in your ankles.  Have trouble with your vision. Get help right away if you:  Get a very bad headache.  Start to feel mixed up (confused).  Feel weak or numb.  Feel faint.  Have very bad pain in your: ? Chest. ? Belly (abdomen).  Throw up more than once.  Have trouble breathing. Summary  Hypertension is another name for high blood pressure.  High blood pressure forces your heart to work harder to pump  blood.  For most people, a normal blood pressure is less than 120/80.  Making healthy choices can help lower blood pressure. If your blood pressure does not get lower with healthy choices, you may need to take medicine. This information is not intended to replace advice given to you by your health care provider. Make sure you discuss any questions you have with your health care provider. Document Revised: 09/11/2017 Document Reviewed: 09/11/2017 Elsevier Patient Education  2021 [employer].

## 2020-04-22 NOTE — Progress Notes (Signed)
Establish care

## 2020-04-22 NOTE — Progress Notes (Addendum)
Subjective:    [name] [place] - 75 y.o. female MRN [id number]  Date of birth: [birth year]  HPI  [name] is to establish care. Patient has a PMH significant for hypertension, chronic obstructive pulmonary disease, thyroid nodule, cyst of right ovary, type 2 diabetes mellitus without complication with long-term current use of insulin, trapezius muscle spasm, degenerative disc disease lumbar, pure hypercholesterolemia, history of rectal polypectomy, tobacco user, and nuclear sclerotic cataract of both eyes.   Current issues and/or concerns: 1. HYPERTENSION: Currently taking: see medication list Have you taken your blood pressure medication today: [x]  Yes []  No  Med Adherence: [x]  Yes    []  No Medication side effects: []  Yes    [x]  No  Adherence with salt restriction (low-salt diet): []  Yes  [x]  No Exercise: Yes []  No [x]  COPD makes difficult Home Monitoring?: []  Yes    [x]  No, has arm and wrist monitor but not sure how to use either Monitoring Frequency: []  Yes    [x]  No Home BP results range: []  Yes    [x]  No Smoking [x]  Yes, 1 pack daily  SOB? [x]  Yes    []  No  Chest Pain?: []  Yes    [x]  No Leg swelling?: []  Yes    [x]  No Headaches?: []  Yes    [x]  No Dizziness? []  Yes    [x]  No  2. DIABETES TYPE 2:  Last A1C: 7.7% on 02/17/2020    Med Adherence:  [x]  Yes    []  No Medication side effects:  []  Yes    [x]  No Home Monitoring?  [x]  Yes    []  No Home glucose results range: fluctuates  Diet Adherence: []  Yes    [x]  No Exercise: []  Yes    [x]  No, COPD makes difficult Hypoglycemic episodes?: []  Yes    [x]  No Numbness of the feet? []  Yes    [x]  No Retinopathy hx? []  Yes    [x]  No Last eye exam: June 2021   ROS per HPI   Health Maintenance:  There are no preventive care reminders to display for this patient.  Past Medical History: Patient Active Problem List   Diagnosis Date Noted  . Trapezius muscle spasm 02/10/2019  . Cyst of right ovary 07/28/2017  . Pure  hypercholesterolemia 09/10/2016  . Thyroid nodule 09/10/2016  . COPD (chronic obstructive pulmonary disease) ([place]) 09/10/2016  . Family history of colon cancer in mother 06/06/2016  . Nuclear sclerotic cataract of both eyes 04/08/2013  . Type 2 diabetes mellitus without complication, with long-term current use of insulin ([place]) 09/13/2011  . HTN (hypertension) 09/13/2011  . Tobacco user 09/13/2011  . DDD (degenerative disc disease), lumbar 09/13/2011  . History of rectal polypectomy 08/31/2010    Social History   reports that she has been smoking cigarettes. She has a 25.00 pack-year smoking history. She has never used smokeless tobacco. She reports that she does not drink alcohol and does not use drugs.   Family History  family history includes Cancer (age of onset: 89) in her father; Cancer (age of onset: 62) in her mother; Diabetes in her brother, maternal grandmother, mother, and sister; Heart disease (age of onset: 29) in her mother; Hyperlipidemia in her mother; Hypertension in her brother; Mental illness in her sister.   Medications: reviewed and updated   Objective:   Physical Exam BP (!) 165/88 (BP Location: Left Arm)   Pulse 85   Ht 5' 7.01" (1.702 m)   Wt 172 lb 6.4 oz (  78.2 kg)   SpO2 93%   BMI 27.00 kg/m  Physical Exam HENT:     Head: Normocephalic and atraumatic.  Eyes:     Extraocular Movements: Extraocular movements intact.     Conjunctiva/sclera: Conjunctivae normal.     Pupils: Pupils are equal, round, and reactive to light.  Cardiovascular:     Rate and Rhythm: Normal rate and regular rhythm.     Pulses: Normal pulses.     Heart sounds: Normal heart sounds.  Pulmonary:     Effort: Pulmonary effort is normal.     Breath sounds: Normal breath sounds.  Musculoskeletal:     Cervical back: Normal range of motion and neck supple.  Neurological:     Mental Status: She is alert.  Psychiatric:        Mood and Affect: Mood normal.        Behavior: Behavior  normal.     Assessment & Plan:  1. Encounter to establish care: - Patient presents today to establish care.  - Return for annual physical examination, labs, and health maintenance. Arrive fasting meaning having no for at least 8 hours prior to appointment. You may have only water or black coffee. Please take scheduled medications as normal.  2. Essential hypertension: - Blood pressure not at goal during today's visit. Patient asymptomatic without chest pressure, chest pain, palpitations, shortness of breath, and worst headache of life. - Increase Lisinopril from 20 mg daily to 40 mg daily.  - Continue Metoprolol 25 mg 24-hour tablet daily as prescribed.  - Counseled on blood pressure goal of less than 140/90, low-sodium, DASH diet, medication compliance, 150 minutes of moderate intensity exercise per week as tolerated. Discussed medication compliance, adverse effects. - Bring home blood pressure monitor to next office visit. - Return in 1 week for blood pressure check or sooner if needed.   3. Type 2 diabetes mellitus without complication, without long-term current use of insulin ([place]): - Last hemoglobin A1c obtained 02/17/2020 at goal at 7.7%, goal < 8%. Next hemoglobin A1c due May 2022.  - Continue Sitagliptin-Metformin (Janumet) 50-1000 mg tablet twice daily as prescribed.  - Discussed the importance of healthy eating habits, low-carbohydrate diet, low-sugar diet, regular aerobic exercise, and medication compliance to achieve or maintain control of diabetes. - Follow-up with primary provider in 4 weeks or sooner if needed.   Patient was given clear instructions to go to Emergency Department or return to medical center if symptoms don't improve, worsen, or new problems develop.The patient verbalized understanding.  I discussed the assessment and treatment plan with the patient. The patient was provided an opportunity to ask questions and all were answered. The patient agreed with the plan and  demonstrated an understanding of the instructions.   The patient was advised to call back or seek an in-person evaluation if the symptoms worsen or if the condition fails to improve as anticipated.  Durene Fruits, NP 04/23/2020, 9:52 PM Primary Care at [facility]

## 2020-04-28 NOTE — Telephone Encounter (Signed)
Opened in error

## 2020-04-29 ENCOUNTER — Ambulatory Visit: Payer: Medicare Other

## 2020-05-02 NOTE — Progress Notes (Addendum)
Patient ID: [name], female    DOB: 1946-01-03  MRN: [id number]  CC: Hypertension Follow-Up  Subjective: [name] is a 75 y.o. female who presents for hypertension follow-up. Her concerns today include:  1. HYPERTENSION FOLLOW-UP: 04/22/2020: - Increase Lisinopril from 20 mg daily to 40 mg daily.  - Continue Metoprolol 25 mg 24-hour tablet daily as prescribed.  - Bring home blood pressure monitor to next office visit. - Return in 1 week for blood pressure check or sooner if needed.   05/03/2020:  Currently taking: see medication list Med Adherence: $RemoveBefore'[x]'tPhnwwRnhfXAa$  Yes    '[]'$  No Medication side effects: $RemoveBefor'[]'enoHPHDHhLjB$  Yes    '[x]'$  No Adherence with salt restriction (low-salt diet): $RemoveBeforeDE'[]'XslDsospGOZVUkM$  Yes    '[x]'$  No Exercise: Yes $RemoveBefor'[]'tKEkRxiarhDu$  No $R'[x]'Hw$  Home Monitoring?: $RemoveBeforeDEI'[]'rqViGUxCEAgmmZTL$  Yes    '[x]'$  No, does present today with home blood pressure monitor with inquiry on how to properly operate Smoking $RemoveBeforeD'[x]'DkSZzZfArKwPOy$  Yes, 1 pack daily SOB? $RemoveBe'[x]'jdgqHdfav$  Yes, related to COPD  Chest Pain?: $RemoveBefo'[]'eHJONfeqonJ$  Yes    '[x]'$  No Leg swelling?: $RemoveBefore'[]'VfjJvHgXscbIl$  Yes    '[x]'$  No Headaches?: $RemoveBefore'[]'DlpihmSXWgKMh$  Yes    '[x]'$  No Dizziness? $RemoveBefor'[]'nibPmsnlQyQQ$  Yes    '[x]'$  No   2. COPD: Oxygen use: No, does not feel this is a reasonable option because of the structure of her home. Dyspnea frequency: both activity and rest  Cough frequency: yes  Rescue inhaler frequency:   Limitation of activity: yes Cough: yes  Comments: Does not feel that current two inhalers are enough.   Patient Active Problem List   Diagnosis Date Noted  . Trapezius muscle spasm 02/10/2019  . Cyst of right ovary 07/28/2017  . Pure hypercholesterolemia 09/10/2016  . Thyroid nodule 09/10/2016  . COPD (chronic obstructive pulmonary disease) ([place]) 09/10/2016  . Family history of colon cancer in mother 06/06/2016  . Nuclear sclerotic cataract of both eyes 04/08/2013  . Type 2 diabetes mellitus without complication, with long-term current use of insulin ([place]) 09/13/2011  . HTN (hypertension) 09/13/2011  . Tobacco user 09/13/2011  . DDD (degenerative disc disease),  lumbar 09/13/2011  . History of rectal polypectomy 08/31/2010     Current Outpatient Medications on File Prior to Visit  Medication Sig Dispense Refill  . ACCU-CHEK GUIDE test strip USE UP TO 4 TIMES DAILY AS  DIRECTED 200 strip 6  . Accu-Chek Softclix Lancets lancets USE UP TO 4 TIMES DAILY AS  DIRECTED 200 each 6  . aspirin 81 MG tablet Take 81 mg by mouth daily.    . BD PEN NEEDLE NANO U/F 32G X 4 MM MISC USE AS DIRECTED 90 each 1  . blood glucose meter kit and supplies KIT Dispense based on patient and insurance preference. Use up to four times daily as directed. (FOR ICD-9 250.00, 250.01). 1 each 0  . blood glucose meter kit and supplies Dispense based on insurance preference. Use up to four times daily as directed. (FOR ICD-10 E11.9 E11.65) AccuCheck Meter    1 each 0  . Cholecalciferol (VITAMIN D3) 1000 UNITS CAPS Take 2 capsules by mouth every morning.    . fluticasone furoate-vilanterol (BREO ELLIPTA) 100-25 MCG/INH AEPB USE 1 INHALATION BY MOUTH  ONCE DAILY AT THE SAME TIME EACH DAY 180 each 3  . insulin glargine (LANTUS SOLOSTAR) 100 UNIT/ML Solostar Pen INJECT SUBCUTANEOUSLY 55  UNITS DAILY AT 10PM 45 mL 3  . JANUMET 50-1000 MG tablet TAKE 1 TABLET BY MOUTH  TWICE DAILY WITH A MEAL 180 tablet 3  .  lisinopril (ZESTRIL) 20 MG tablet TAKE 1 TABLET BY MOUTH  DAILY 90 tablet 3  . metoprolol succinate (TOPROL-XL) 25 MG 24 hr tablet TAKE 1 TABLET BY MOUTH  DAILY 90 tablet 3  . pravastatin (PRAVACHOL) 40 MG tablet TAKE 1 TABLET BY MOUTH IN  THE EVENING AFTER A MEAL 90 tablet 3  . SPIRIVA HANDIHALER 18 MCG inhalation capsule INHALE THE CONTENTS OF 1  CAPSULE BY MOUTH VIA  HANDIHALER DAILY 90 capsule 3   No current facility-administered medications on file prior to visit.    Allergies  Allergen Reactions  . Colestipol Diarrhea    Diarrhea, gas, stomach cramps     Social History   Socioeconomic History  . Marital status: Divorced    Spouse name: Not on file  . Number of  children: 3  . Years of education: Not on file  . Highest education level: Not on file  Occupational History  . Occupation: retired    Comment: [employer] Use  . Smoking status: Current Every Day Smoker    Packs/day: 0.50    Years: 50.00    Pack years: 25.00    Types: Cigarettes  . Smokeless tobacco: Never Used  Vaping Use  . Vaping Use: Never used  Substance and Sexual Activity  . Alcohol use: No  . Drug use: No  . Sexual activity: Not on file  Other Topics Concern  . Not on file  Social History Narrative   Marital status: divorced; not dating in 2019 but interested slightly.        Children:  3 children; 7 seven grandchildren; 1 gg      Lives: with oldest daughter, 2 grandchildren, 68 gg, 1 friend of daughters, daughter's boyfriend.       Employment: retired [employer] age 38.      Tobacco; 1ppd x 50 years.  Not interested in 2019.      Alcohol: none      Exercise: none in 2019[place]      ADLs:   independent with ADLs; no assistant devices      Advanced Directives:  None; DNR/DNI; no HCPOA.    Social Determinants of Health   Financial Resource Strain: Not on file  Food Insecurity: Not on file  Transportation Needs: Not on file  Physical Activity: Not on file  Stress: Not on file  Social Connections: Not on file  Intimate Partner Violence: Not on file    Family History  Problem Relation Age of Onset  . Diabetes Sister   . Diabetes Brother   . Hypertension Brother   . Cancer Mother 19       colon(age 57) and lung (age 25)  . Diabetes Mother   . Heart disease Mother 53       CABG  . Hyperlipidemia Mother   . Cancer Father 68       stomach  . Diabetes Maternal Grandmother   . Mental illness Sister     Past Surgical History:  Procedure Laterality Date  . ABDOMINAL HYSTERECTOMY  2000   fibroids; ovaries intact.  [facility] BACK SURGERY     plate put in back  . CHOLECYSTECTOMY N/A 05/01/2017   Procedure: LAPAROSCOPIC  CHOLECYSTECTOMY WITH INTRAOPERATIVE CHOLANGIOGRAM;  Surgeon: Donnie Mesa, MD;  Location: [facility];  Service: General;  Laterality: N/A;  . 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 TUBAL LIGATION      ROS: Review of Systems  Negative except as stated above  PHYSICAL EXAM: BP 136/84 (BP Location: Right Arm, Patient Position: Sitting)   Pulse (!) 105   Ht 5' 7.01" (1.702 m)   Wt 172 lb 3.2 oz (78.1 kg)   SpO2 99%   BMI 26.96 kg/m   Physical Exam HENT:     Head: Normocephalic and atraumatic.  Eyes:     Extraocular Movements: Extraocular movements intact.     Conjunctiva/sclera: Conjunctivae normal.     Pupils: Pupils are equal, round, and reactive to light.  Cardiovascular:     Rate and Rhythm: Tachycardia present.     Pulses: Normal pulses.     Heart sounds: Normal heart sounds.  Pulmonary:     Effort: Pulmonary effort is normal.     Breath sounds: Normal breath sounds.  Musculoskeletal:     Cervical back: Normal range of motion and neck supple.  Neurological:     General: No focal deficit present.     Mental Status: She is alert and oriented to person, place, and time.  Psychiatric:        Mood and Affect: Mood normal.        Behavior: Behavior normal.    ASSESSMENT AND PLAN: 1. Essential hypertension: - Blood pressure at goal during today's visit.  - Continue Lisinopril 40 mg daily as prescribed .  - Continue Metoprolol 25 mg 24-hour tablet daily as prescribed.  - Patient presents with home blood pressure monitor. Provider demonstrated for patient correct use.  - Counseled on blood pressure goal of less than 140/90, low-sodium, DASH diet, medication compliance, 150 minutes of moderate intensity exercise per week as tolerated. Discussed medication compliance, adverse effects. - BMP to evaluate kidney function and electrolyte balance. - Follow-up with primary provider in 3 months or sooner if needed.  - Basic Metabolic Panel  2. Chronic obstructive pulmonary  disease, unspecified COPD type (HCC): - Continue Spiriva Handihaler inhaler as prescribed.  - Continue Breo Ellipta inhaler as prescribed.  - Begin Fluticasone inhaler as prescribed.  - Begin Prednisone taper as prescribed.  - Current smoker at least 1 pack daily. - Referral to Pulmonology for further evaluation and management.  - Follow-up with primary provider as scheduled.  - Ambulatory referral to Pulmonology - fluticasone (FLOVENT HFA) 110 MCG/ACT inhaler; Inhale 1 puff into the lungs daily.  Dispense: 12 g; Refill: 0 - predniSONE (DELTASONE) 10 MG tablet; Take 6 tablets (60 mg total) by mouth daily with breakfast for 1 day, THEN 5 tablets (50 mg total) daily with breakfast for 1 day, THEN 4 tablets (40 mg total) daily with breakfast for 1 day, THEN 3 tablets (30 mg total) daily with breakfast for 1 day, THEN 2 tablets (20 mg total) daily with breakfast for 1 day, THEN 1 tablet (10 mg total) daily with breakfast for 1 day.  Dispense: 21 tablet; Refill: 0  Patient was given the opportunity to ask questions.  Patient verbalized understanding of the plan and was able to repeat key elements of the plan. Patient was given clear instructions to go to Emergency Department or return to medical center if symptoms don't improve, worsen, or new problems develop.The patient verbalized understanding.   Orders Placed This Encounter  Procedures  . Basic Metabolic Panel  . Ambulatory referral to Pulmonology     Requested Prescriptions   Signed Prescriptions Disp Refills  . fluticasone (FLOVENT HFA) 110 MCG/ACT inhaler 12 g 0    Sig: Inhale 1 puff into the lungs daily.  Return in about 3 months (around 08/02/2020) for Follow-Up hypertension .  Camillia Herter, NP

## 2020-05-03 ENCOUNTER — Other Ambulatory Visit: Payer: Self-pay

## 2020-05-03 ENCOUNTER — Ambulatory Visit (INDEPENDENT_AMBULATORY_CARE_PROVIDER_SITE_OTHER): Payer: Medicare Other | Admitting: Family

## 2020-05-03 ENCOUNTER — Encounter: Payer: Self-pay | Admitting: Family

## 2020-05-03 VITALS — BP 136/84 | HR 105 | Ht 67.01 in | Wt 172.2 lb

## 2020-05-03 DIAGNOSIS — J449 Chronic obstructive pulmonary disease, unspecified: Secondary | ICD-10-CM

## 2020-05-03 DIAGNOSIS — E119 Type 2 diabetes mellitus without complications: Secondary | ICD-10-CM

## 2020-05-03 DIAGNOSIS — I10 Essential (primary) hypertension: Secondary | ICD-10-CM | POA: Diagnosis not present

## 2020-05-03 MED ORDER — PREDNISONE 10 MG PO TABS: ORAL_TABLET | ORAL | 0 refills | Status: AC

## 2020-05-03 MED ORDER — CONTOUR NEXT TEST VI STRP: ORAL_STRIP | 12 refills | Status: DC

## 2020-05-03 MED ORDER — FLUTICASONE PROPIONATE HFA 110 MCG/ACT IN AERO: 1.0000 | INHALATION_SPRAY | Freq: Every day | RESPIRATORY_TRACT | 0 refills | Status: DC

## 2020-05-03 MED ORDER — ACCU-CHEK SOFTCLIX LANCETS MISC: 6 refills | Status: DC

## 2020-05-03 NOTE — Patient Instructions (Addendum)
Continue Spiriva and Breo inhalers for COPD. Adding Flovent inhaler.   Referral to Pulmonology for COPD.  Continue Lisinopril and Metoprolol for high blood pressure. Return in 3 months or sooner if needed.   Lab today. Chronic Obstructive Pulmonary Disease  Chronic obstructive pulmonary disease (COPD) is a long-term (chronic) lung problem. When you have COPD, it is hard for air to get in and out of your lungs. Usually the condition gets worse over time, and your lungs will never return to normal. There are things you can do to keep yourself as healthy as possible. What are the causes?  Smoking. This is the most common cause.  Certain genes passed from parent to child (inherited). What increases the risk?  Being exposed to secondhand smoke from cigarettes, pipes, or cigars.  Being exposed to chemicals and other irritants, such as fumes and dust in the work environment.  Having chronic lung conditions or infections. What are the signs or symptoms?  Shortness of breath, especially during physical activity.  A long-term cough with a large amount of thick mucus. Sometimes, the cough may not have any mucus (dry cough).  Wheezing.  Breathing quickly.  Skin that looks gray or blue, especially in the fingers, toes, or lips.  Feeling tired (fatigue).  Weight loss.  Chest tightness.  Having infections often.  Episodes when breathing symptoms become much worse (exacerbations). At the later stages of this disease, you may have swelling in the ankles, feet, or legs. How is this treated?  Taking medicines.  Quitting smoking, if you smoke.  Rehabilitation. This includes steps to make your body work better. It may involve a team of specialists.  Doing exercises.  Making changes to your diet.  Using oxygen.  Lung surgery.  Lung transplant.  Comfort measures (palliative care). Follow these instructions at home: Medicines  Take over-the-counter and prescription  medicines only as told by your doctor.  Talk to your doctor before taking any cough or allergy medicines. You may need to avoid medicines that cause your lungs to be dry. Lifestyle  If you smoke, stop smoking. Smoking makes the problem worse.  Do not smoke or use any products that contain nicotine or tobacco. If you need help quitting, ask your doctor.  Avoid being around things that make your breathing worse. This may include smoke, chemicals, and fumes.  Stay active, but remember to rest as well.  Learn and use tips on how to manage stress and control your breathing.  Make sure you get enough sleep. Most adults need at least 7 hours of sleep every night.  Eat healthy foods. Eat smaller meals more often. Rest before meals. Controlled breathing Learn and use tips on how to control your breathing as told by your doctor. Try:  Breathing in (inhaling) through your nose for 1 second. Then, pucker your lips and breath out (exhale) through your lips for 2 seconds.  Putting one hand on your belly (abdomen). Breathe in slowly through your nose for 1 second. Your hand on your belly should move out. Pucker your lips and breathe out slowly through your lips. Your hand on your belly should move in as you breathe out.   Controlled coughing Learn and use controlled coughing to clear mucus from your lungs. Follow these steps: 1. Lean your head a little forward. 2. Breathe in deeply. 3. Try to hold your breath for 3 seconds. 4. Keep your mouth slightly open while coughing 2 times. 5. Spit any mucus out into a tissue.  6. Rest and do the steps again 1 or 2 times as needed. General instructions  Make sure you get all the shots (vaccines) that your doctor recommends. Ask your doctor about a flu shot and a pneumonia shot.  Use oxygen therapy and pulmonary rehabilitation if told by your doctor. If you need home oxygen therapy, ask your doctor if you should buy a tool to measure your oxygen level  (oximeter).  Make a COPD action plan with your doctor. This helps you to know what to do if you feel worse than usual.  Manage any other conditions you have as told by your doctor.  Avoid going outside when it is very hot, cold, or humid.  Avoid people who have a sickness you can catch (contagious).  Keep all follow-up visits. Contact a doctor if:  You cough up more mucus than usual.  There is a change in the color or thickness of the mucus.  It is harder to breathe than usual.  Your breathing is faster than usual.  You have trouble sleeping.  You need to use your medicines more often than usual.  You have trouble doing your normal activities such as getting dressed or walking around the house. Get help right away if:  You have shortness of breath while resting.  You have shortness of breath that stops you from: ? Being able to talk. ? Doing normal activities.  Your chest hurts for longer than 5 minutes.  Your skin color is more blue than usual.  Your pulse oximeter shows that you have low oxygen for longer than 5 minutes.  You have a fever.  You feel too tired to breathe normally. These symptoms may represent a serious problem that is an emergency. Do not wait to see if the symptoms will go away. Get medical help right away. Call your local emergency services (911 in the [place] Do not drive yourself to the hospital. Summary  Chronic obstructive pulmonary disease (COPD) is a long-term lung problem.  The way your lungs work will never return to normal. Usually the condition gets worse over time. There are things you can do to keep yourself as healthy as possible.  Take over-the-counter and prescription medicines only as told by your doctor.  If you smoke, stop. Smoking makes the problem worse. This information is not intended to replace advice given to you by your health care provider. Make sure you discuss any questions you have with your health care  provider. Document Revised: 11/10/2019 Document Reviewed: 11/10/2019 Elsevier Patient Education  2021 [employer].

## 2020-05-03 NOTE — Progress Notes (Signed)
Lancets and Contour test strips sent into [facility] mail order pharmacy

## 2020-05-03 NOTE — Progress Notes (Signed)
HTN f/u Concerns that both inhalers are not working Has SOB constantly

## 2020-05-04 LAB — BASIC METABOLIC PANEL
BUN/Creatinine Ratio: 13 (ref 12–28)
BUN: 15 mg/dL (ref 8–27)
CO2: 25 mmol/L (ref 20–29)
Calcium: 9.7 mg/dL (ref 8.7–10.3)
Chloride: 98 mmol/L (ref 96–106)
Creatinine, Ser: 1.15 mg/dL — ABNORMAL HIGH (ref 0.57–1.00)
Glucose: 249 mg/dL — ABNORMAL HIGH (ref 65–99)
Potassium: 5.7 mmol/L — ABNORMAL HIGH (ref 3.5–5.2)
Sodium: 138 mmol/L (ref 134–144)
eGFR: 50 mL/min/{1.73_m2} — ABNORMAL LOW (ref >59)

## 2020-05-04 NOTE — Progress Notes (Signed)
Kidney function not 100%. Patient encouraged to have rechecked in 3 months.   Over the counter medications such as Ibuprofen, Aleve, Motrin, and Naproxen can be damaging to your kidneys and try to avoid those as much as possible.   Tylenol or Acetaminophen is a safer option to use.  Follow-up with provider as needed.

## 2020-05-05 ENCOUNTER — Other Ambulatory Visit: Payer: Self-pay | Admitting: Family

## 2020-05-05 DIAGNOSIS — Z1231 Encounter for screening mammogram for malignant neoplasm of breast: Secondary | ICD-10-CM

## 2020-05-11 ENCOUNTER — Ambulatory Visit: Payer: Self-pay | Admitting: Registered Nurse

## 2020-05-17 ENCOUNTER — Other Ambulatory Visit: Payer: Self-pay | Admitting: *Deleted

## 2020-05-17 DIAGNOSIS — E119 Type 2 diabetes mellitus without complications: Secondary | ICD-10-CM

## 2020-05-17 MED ORDER — CONTOUR NEXT TEST VI STRP: ORAL_STRIP | 12 refills | Status: DC

## 2020-05-17 MED ORDER — ACCU-CHEK SOFTCLIX LANCETS MISC: 6 refills | Status: DC

## 2020-05-19 ENCOUNTER — Other Ambulatory Visit: Payer: Self-pay

## 2020-05-19 ENCOUNTER — Telehealth: Payer: Self-pay | Admitting: Family

## 2020-05-19 DIAGNOSIS — J449 Chronic obstructive pulmonary disease, unspecified: Secondary | ICD-10-CM

## 2020-05-19 MED ORDER — SPIRIVA HANDIHALER 18 MCG IN CAPS: ORAL_CAPSULE | RESPIRATORY_TRACT | 1 refills | Status: DC

## 2020-05-19 NOTE — Progress Notes (Signed)
Spiriva inhaler refilled

## 2020-05-19 NOTE — Telephone Encounter (Signed)
Pt needs refill on  SPIRIVA HANDIHALER 18 MCG inhalation capsule    Pharmacy  [facility], [address], [address], [place], [place] [address]  Phone:  [phone] Fax:  [phone]

## 2020-05-30 ENCOUNTER — Encounter: Payer: Medicare Other | Admitting: Family

## 2020-06-10 ENCOUNTER — Telehealth: Payer: Self-pay | Admitting: Pulmonary Disease

## 2020-06-10 ENCOUNTER — Other Ambulatory Visit: Payer: Self-pay

## 2020-06-10 ENCOUNTER — Encounter: Payer: Self-pay | Admitting: Pulmonary Disease

## 2020-06-10 ENCOUNTER — Ambulatory Visit: Payer: Medicare Other | Admitting: Pulmonary Disease

## 2020-06-10 VITALS — BP 148/90 | HR 77 | Ht 67.0 in | Wt 176.0 lb

## 2020-06-10 DIAGNOSIS — R0602 Shortness of breath: Secondary | ICD-10-CM | POA: Diagnosis not present

## 2020-06-10 MED ORDER — TRELEGY ELLIPTA 200-62.5-25 MCG/INH IN AEPB: 1.0000 | INHALATION_SPRAY | Freq: Every day | RESPIRATORY_TRACT | 0 refills | Status: DC

## 2020-06-10 NOTE — Progress Notes (Signed)
[name]    [id number]    12-Sep-1945  Primary Care Physician:[name], Flonnie Hailstone, NP  Referring Physician: Camillia Herter, NP [address] [place],  [place] [address]  Chief complaint:   Patient seen for shortness of breath  HPI:  Shortness of breath for many years duration  Active smoker about a pack a day  Currently on Breo and Spiriva -Does not feel they are working well  Shortness of breath with mild exertion  Occasional cough, not really bringing up any secretions No weight loss  History of diabetes, hypertension-working on better control  Parents did smoke   Outpatient Encounter Medications as of 06/10/2020  Medication Sig  . Accu-Chek Softclix Lancets lancets USE UP TO 4 TIMES DAILY AS  DIRECTED  . aspirin 81 MG tablet Take 81 mg by mouth daily.  . BD PEN NEEDLE NANO U/F 32G X 4 MM MISC USE AS DIRECTED  . blood glucose meter kit and supplies KIT Dispense based on patient and insurance preference. Use up to four times daily as directed. (FOR ICD-9 250.00, 250.01).  . blood glucose meter kit and supplies Dispense based on insurance preference. Use up to four times daily as directed. (FOR ICD-10 E11.9 E11.65) AccuCheck Meter     . Cholecalciferol (VITAMIN D3) 1000 UNITS CAPS Take 2 capsules by mouth every morning.  . fluticasone (FLOVENT HFA) 110 MCG/ACT inhaler Inhale 1 puff into the lungs daily.  . fluticasone furoate-vilanterol (BREO ELLIPTA) 100-25 MCG/INH AEPB USE 1 INHALATION BY MOUTH  ONCE DAILY AT THE SAME TIME EACH DAY  . glucose blood (CONTOUR NEXT TEST) test strip Use as instructed  . insulin glargine (LANTUS SOLOSTAR) 100 UNIT/ML Solostar Pen INJECT SUBCUTANEOUSLY 55  UNITS DAILY AT 10PM  . JANUMET 50-1000 MG tablet TAKE 1 TABLET BY MOUTH  TWICE DAILY WITH A MEAL  . lisinopril (ZESTRIL) 20 MG tablet TAKE 1 TABLET BY MOUTH  DAILY  . metoprolol succinate (TOPROL-XL) 25 MG 24 hr tablet TAKE 1 TABLET BY MOUTH  DAILY  . pravastatin  (PRAVACHOL) 40 MG tablet TAKE 1 TABLET BY MOUTH IN  THE EVENING AFTER A MEAL  . tiotropium (SPIRIVA HANDIHALER) 18 MCG inhalation capsule INHALE THE CONTENTS OF 1  CAPSULE BY MOUTH VIA  HANDIHALER DAILY   No facility-administered encounter medications on file as of 06/10/2020.    Allergies as of 06/10/2020 - Review Complete 06/10/2020  Allergen Reaction Noted  . Colestipol Diarrhea 01/30/2018    Past Medical History:  Diagnosis Date  . COPD (chronic obstructive pulmonary disease) ([place])   . Diabetes mellitus   . Hyperlipidemia   . Hypertension   . Night sweats   . Rectal polyp     Past Surgical History:  Procedure Laterality Date  . ABDOMINAL HYSTERECTOMY  2000   fibroids; ovaries intact.  [facility] BACK SURGERY     plate put in back  . CHOLECYSTECTOMY N/A 05/01/2017   Procedure: LAPAROSCOPIC CHOLECYSTECTOMY WITH INTRAOPERATIVE CHOLANGIOGRAM;  Surgeon: Donnie Mesa, MD;  Location: [facility];  Service: General;  Laterality: N/A;  . 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 TUBAL LIGATION      Family History  Problem Relation Age of Onset  . Diabetes Sister   . Diabetes Brother   . Hypertension Brother   . Cancer Mother 64       colon(age 16) and lung (age 38)  . Diabetes Mother   . Heart disease Mother 58  CABG  . Hyperlipidemia Mother   . Cancer Father 62       stomach  . Diabetes Maternal Grandmother   . Mental illness Sister     Social History   Socioeconomic History  . Marital status: Divorced    Spouse name: Not on file  . Number of children: 3  . Years of education: Not on file  . Highest education level: Not on file  Occupational History  . Occupation: retired    Comment: [employer] Use  . Smoking status: Current Every Day Smoker    Packs/day: 2.50    Years: 60.00    Pack years: 150.00    Types: Cigarettes  . Smokeless tobacco: Never Used  . Tobacco comment: 1 ppd  Vaping Use  . Vaping Use: Never used  Substance and Sexual  Activity  . Alcohol use: No  . Drug use: No  . Sexual activity: Not on file  Other Topics Concern  . Not on file  Social History Narrative   Marital status: divorced; not dating in 2019 but interested slightly.        Children:  3 children; 7 seven grandchildren; 1 gg      Lives: with oldest daughter, 2 grandchildren, 65 gg, 1 friend of daughters, daughter's boyfriend.       Employment: retired [employer] age 42.      Tobacco; 1ppd x 50 years.  Not interested in 2019.      Alcohol: none      Exercise: none in 2019. [place]      ADLs:   independent with ADLs; no assistant devices      Advanced Directives:  None; DNR/DNI; no HCPOA.    Social Determinants of Health   Financial Resource Strain: Not on file  Food Insecurity: Not on file  Transportation Needs: Not on file  Physical Activity: Not on file  Stress: Not on file  Social Connections: Not on file  Intimate Partner Violence: Not on file    Review of Systems  Respiratory: Positive for shortness of breath.     Vitals:   06/10/20 0949  BP: (!) 148/90  Pulse: 77  SpO2: 99%     Physical Exam Constitutional:      Appearance: She is obese.  HENT:     Head: Atraumatic.     Nose: No congestion or rhinorrhea.     Mouth/Throat:     Pharynx: No oropharyngeal exudate.  Eyes:     General:        Right eye: No discharge.        Left eye: No discharge.  Cardiovascular:     Rate and Rhythm: Normal rate and regular rhythm.  Pulmonary:     Effort: No respiratory distress.     Breath sounds: No stridor. No wheezing or rhonchi.  Musculoskeletal:     Cervical back: No rigidity or tenderness.  Neurological:     Mental Status: She is alert.  Psychiatric:        Mood and Affect: Mood normal.     Data Reviewed: No recent x-ray  Assessment:  Shortness of breath on exertion  Advanced chronic obstructive pulmonary disease likely with significant emphysema  Active  smoker Plan/Recommendations: Obtain pulmonary function test  Obtain echocardiogram for shortness of breath on exertion  Smoking cessation counseling  Trial with Trelegy in place of Spiriva and Breo  If Trelegy works better for her then we will switch  otherwise we will go back on Spiriva and Breo  Inhaler technique was reviewed with the patient and advised about the use inhalers optimally  I will see her back in 6 to 8 weeks  Encouraged to call with any significant concerns  The importance of quitting smoking was reiterated    Sherrilyn Rist MD [facility] 06/10/2020, 10:21 AM  CC: Camillia Herter, NP

## 2020-06-10 NOTE — Patient Instructions (Signed)
Continue to work on quitting smoking  We will give you samples of Trelegy to try -If it works better than what you are using now then give [place] a call to send in prescription  Otherwise, if it does not work better -Continue using Breo and Spiriva  Exercise as tolerated  I will see you in about 6 weeks  Obtain echocardiogram  obtain pulmonary function test

## 2020-06-10 NOTE — Telephone Encounter (Signed)
Called and spoke with Patient.  Patient stated she received Trelegy samples at [facility],but was unsure when to start, and how many times per day.  Advised patient 1 puff once daily, and to always rinse mouth after use.  Understanding stated.  Nothing further at this time.

## 2020-06-28 ENCOUNTER — Telehealth: Payer: Self-pay | Admitting: Family

## 2020-06-28 ENCOUNTER — Other Ambulatory Visit: Payer: Self-pay | Admitting: Family

## 2020-06-28 DIAGNOSIS — I10 Essential (primary) hypertension: Secondary | ICD-10-CM

## 2020-06-28 MED ORDER — LISINOPRIL 20 MG PO TABS: 1.0000 | ORAL_TABLET | Freq: Every day | ORAL | 0 refills | Status: DC

## 2020-06-28 NOTE — Telephone Encounter (Signed)
1) Medication(s) Requested (by name):lisinopril (ZESTRIL) 20 MG tablet   BD PEN NEEDLE NANO U/F 32G X 4 MM MISC   2) Pharmacy of Choice: [facility]  ([place], [address], [address]   [place], [place], [place] [address]  Phone:  [phone]  Fax:  [phone]   Pt stated she was told to take 40mg  of the lisinopril so she has been taking 2 of her 20mg  a day.

## 2020-06-28 NOTE — Telephone Encounter (Signed)
Lisinopril (Zestril) refilled for courtesy 30 day supply. Please schedule office visit for additional refills.

## 2020-06-29 ENCOUNTER — Other Ambulatory Visit: Payer: Self-pay | Admitting: Family

## 2020-06-29 DIAGNOSIS — E1165 Type 2 diabetes mellitus with hyperglycemia: Secondary | ICD-10-CM

## 2020-06-29 DIAGNOSIS — Z794 Long term (current) use of insulin: Secondary | ICD-10-CM

## 2020-06-29 DIAGNOSIS — I10 Essential (primary) hypertension: Secondary | ICD-10-CM

## 2020-06-29 MED ORDER — LISINOPRIL 40 MG PO TABS: 40.0000 mg | ORAL_TABLET | Freq: Every day | ORAL | 0 refills | Status: DC

## 2020-06-29 MED ORDER — BD PEN NEEDLE NANO U/F 32G X 4 MM MISC: 1.0000 | Freq: Every day | 1 refills | Status: DC

## 2020-06-29 NOTE — Telephone Encounter (Signed)
Patient continued on  Lisinopril 40 mg daily during last visit with me on 05/03/2020.  Lisinopril prescription updated to 40 mg tablets per patient request. Keep appointment with me on 08/01/2020.  Insulin pen needle refilled per patient request.

## 2020-06-29 NOTE — Telephone Encounter (Signed)
Pt called back stating she has an appointment scheduled for August 01, 2020 at 8:50am. Pt states she needs Lisinopril 40mg  sent in since she has been taking 2 of her 20mg  pills each day. Pt would like the medication to be sent to  Pharmacy of Choice: [facility]  ([place], [address], [address]  [place], [address], [place] [address]  Phone:  [phone]  Fax:  [phone]

## 2020-06-29 NOTE — Telephone Encounter (Signed)
  Pt also needs refill on  BD PEN NEEDLE NANO U/F 32G X 4 MM MISC  Pharmacy of Choice: [facility]  ([place], [address], [address]  [place], [place], [place] [address]  Phone:  [phone]  Fax:  [phone]

## 2020-06-30 DIAGNOSIS — H524 Presbyopia: Secondary | ICD-10-CM | POA: Diagnosis not present

## 2020-06-30 DIAGNOSIS — H259 Unspecified age-related cataract: Secondary | ICD-10-CM | POA: Diagnosis not present

## 2020-06-30 DIAGNOSIS — Z135 Encounter for screening for eye and ear disorders: Secondary | ICD-10-CM | POA: Diagnosis not present

## 2020-06-30 DIAGNOSIS — E119 Type 2 diabetes mellitus without complications: Secondary | ICD-10-CM | POA: Diagnosis not present

## 2020-06-30 DIAGNOSIS — H5203 Hypermetropia, bilateral: Secondary | ICD-10-CM | POA: Diagnosis not present

## 2020-06-30 LAB — HM DIABETES EYE EXAM

## 2020-07-04 ENCOUNTER — Telehealth: Payer: Self-pay | Admitting: Pulmonary Disease

## 2020-07-04 ENCOUNTER — Ambulatory Visit
Admission: RE | Admit: 2020-07-04 | Discharge: 2020-07-04 | Disposition: A | Discharge status: Home / Self Care | Payer: Medicare Other | Source: Ambulatory Visit | Attending: Family | Admitting: Family

## 2020-07-04 ENCOUNTER — Other Ambulatory Visit: Payer: Self-pay

## 2020-07-04 DIAGNOSIS — Z1231 Encounter for screening mammogram for malignant neoplasm of breast: Secondary | ICD-10-CM | POA: Diagnosis not present

## 2020-07-04 NOTE — Telephone Encounter (Signed)
Called and spoke with Patient.  Patient stated Trelegy works good and she would like to continue with Trelegy, but she has 3 months supply of Breo, and Spiriva, and does not want to waste it.  Patient stated she would call for Trelegy prescription once she is close to finishing Breo and Spiriva. Nothing further at this time.  06/10/20 AVS-  Instructions  Continue to work on quitting smoking   We will give you samples of Trelegy to try -If it works better than what you are using now then give [place] a call to send in prescription   Otherwise, if it does not work better -Continue using Breo and Spiriva   Exercise as tolerated   I will see you in about 6 weeks   Obtain echocardiogram obtain pulmonary function test

## 2020-07-07 ENCOUNTER — Other Ambulatory Visit: Payer: Self-pay

## 2020-07-07 ENCOUNTER — Ambulatory Visit (HOSPITAL_COMMUNITY): Payer: Medicare Other | Attending: Cardiology

## 2020-07-07 DIAGNOSIS — R0602 Shortness of breath: Secondary | ICD-10-CM | POA: Diagnosis not present

## 2020-07-07 LAB — ECHOCARDIOGRAM COMPLETE
Area-P 1/2: 4.15 cm2
P 1/2 time: 353 msec
S' Lateral: 2.1 cm

## 2020-07-12 ENCOUNTER — Other Ambulatory Visit: Payer: Self-pay | Admitting: Registered Nurse

## 2020-07-12 DIAGNOSIS — Z01818 Encounter for other preprocedural examination: Secondary | ICD-10-CM | POA: Diagnosis not present

## 2020-07-12 DIAGNOSIS — E119 Type 2 diabetes mellitus without complications: Secondary | ICD-10-CM

## 2020-07-12 DIAGNOSIS — H2512 Age-related nuclear cataract, left eye: Secondary | ICD-10-CM | POA: Diagnosis not present

## 2020-07-12 DIAGNOSIS — Z794 Long term (current) use of insulin: Secondary | ICD-10-CM

## 2020-07-12 DIAGNOSIS — H2511 Age-related nuclear cataract, right eye: Secondary | ICD-10-CM | POA: Diagnosis not present

## 2020-07-19 ENCOUNTER — Other Ambulatory Visit (HOSPITAL_COMMUNITY)
Admission: RE | Admit: 2020-07-19 | Discharge: 2020-07-19 | Disposition: A | Discharge status: Home / Self Care | Payer: Medicare Other | Source: Ambulatory Visit | Attending: Pulmonary Disease | Admitting: Pulmonary Disease

## 2020-07-19 DIAGNOSIS — Z20822 Contact with and (suspected) exposure to covid-19: Secondary | ICD-10-CM | POA: Diagnosis not present

## 2020-07-19 DIAGNOSIS — Z01812 Encounter for preprocedural laboratory examination: Secondary | ICD-10-CM | POA: Diagnosis not present

## 2020-07-20 LAB — SARS CORONAVIRUS 2 (TAT 6-24 HRS): SARS Coronavirus 2: NEGATIVE

## 2020-07-22 ENCOUNTER — Ambulatory Visit: Payer: Medicare Other | Admitting: Primary Care

## 2020-07-22 ENCOUNTER — Other Ambulatory Visit: Payer: Self-pay

## 2020-07-22 ENCOUNTER — Ambulatory Visit (INDEPENDENT_AMBULATORY_CARE_PROVIDER_SITE_OTHER): Payer: Medicare Other | Admitting: Pulmonary Disease

## 2020-07-22 ENCOUNTER — Encounter: Payer: Self-pay | Admitting: Primary Care

## 2020-07-22 VITALS — BP 138/82 | HR 91 | Temp 98.2°F | Ht 67.0 in | Wt 176.0 lb

## 2020-07-22 DIAGNOSIS — I5189 Other ill-defined heart diseases: Secondary | ICD-10-CM | POA: Diagnosis not present

## 2020-07-22 DIAGNOSIS — R0602 Shortness of breath: Secondary | ICD-10-CM

## 2020-07-22 DIAGNOSIS — Z720 Tobacco use: Secondary | ICD-10-CM

## 2020-07-22 DIAGNOSIS — F17210 Nicotine dependence, cigarettes, uncomplicated: Secondary | ICD-10-CM | POA: Diagnosis not present

## 2020-07-22 DIAGNOSIS — J431 Panlobular emphysema: Secondary | ICD-10-CM | POA: Diagnosis not present

## 2020-07-22 LAB — PULMONARY FUNCTION TEST
DL/VA % pred: 76 %
DL/VA: 3.07 ml/min/mmHg/L
DLCO cor % pred: 54 %
DLCO cor: 11.6 ml/min/mmHg
DLCO unc % pred: 54 %
DLCO unc: 11.6 ml/min/mmHg
FEF 25-75 Post: 1.67 L/sec
FEF 25-75 Pre: 0.82 L/sec
FEF2575-%Change-Post: 104 %
FEF2575-%Pred-Post: 96 %
FEF2575-%Pred-Pre: 47 %
FEV1-%Change-Post: 15 %
FEV1-%Pred-Post: 72 %
FEV1-%Pred-Pre: 62 %
FEV1-Post: 1.43 L
FEV1-Pre: 1.24 L
FEV1FVC-%Change-Post: 5 %
FEV1FVC-%Pred-Pre: 90 %
FEV6-%Change-Post: 4 %
FEV6-%Pred-Post: 75 %
FEV6-%Pred-Pre: 72 %
FEV6-Post: 1.86 L
FEV6-Pre: 1.79 L
FEV6FVC-%Pred-Post: 103 %
FEV6FVC-%Pred-Pre: 103 %
FVC-%Change-Post: 9 %
FVC-%Pred-Post: 77 %
FVC-%Pred-Pre: 70 %
FVC-Post: 1.97 L
FVC-Pre: 1.79 L
Post FEV1/FVC ratio: 73 %
Post FEV6/FVC ratio: 100 %
Pre FEV1/FVC ratio: 69 %
Pre FEV6/FVC Ratio: 100 %
RV % pred: 109 %
RV: 2.68 L
TLC % pred: 86 %
TLC: 4.75 L

## 2020-07-22 MED ORDER — NICOTINE POLACRILEX 4 MG MT GUM: 4.0000 mg | CHEWING_GUM | OROMUCOSAL | 0 refills | Status: DC | PRN

## 2020-07-22 MED ORDER — TRELEGY ELLIPTA 200-62.5-25 MCG/INH IN AEPB: 1.0000 | INHALATION_SPRAY | Freq: Every day | RESPIRATORY_TRACT | 11 refills | Status: DC

## 2020-07-22 NOTE — Patient Instructions (Addendum)
Recommendations: - Continue Spiriva+Breo until complete; then start Trelegy 200 one puff daily in the morning - Work on cutting back amount you smoke (take 1 cigarette away morning and evening and try nicotine gum in place; continue to taper amount you smoke every week until you have stopped altogether)  Rx: - Trelegy starting September  - Nicotine gum   Follow-up: - 3-4 months with Dr. Ander Slade   Steps to Quit Smoking Smoking tobacco is the leading cause of preventable death. It can affect almost every organ in the body. Smoking puts you and people around you at risk for many serious, long-lasting (chronic) diseases. Quitting smoking can be hard, but it is one of the best things thatyou can do for your health. It is never too late to quit. How do I get ready to quit? When you decide to quit smoking, make a plan to help you succeed. Before you quit: Pick a date to quit. Set a date within the next 2 weeks to give you time to prepare. Write down the reasons why you are quitting. Keep this list in places where you will see it often. Tell your family, friends, and co-workers that you are quitting. Their support is important. Talk with your doctor about the choices that may help you quit. Find out if your health insurance will pay for these treatments. Know the people, places, things, and activities that make you want to smoke (triggers). Avoid them. What first steps can I take to quit smoking? Throw away all cigarettes at home, at work, and in your car. Throw away the things that you use when you smoke, such as ashtrays and lighters. Clean your car. Make sure to empty the ashtray. Clean your home, including curtains and carpets. What can I do to help me quit smoking? Talk with your doctor about taking medicines and seeing a counselor at the same time. You are more likely to succeed when you do both. If you are pregnant or breastfeeding, talk with your doctor about counseling or other ways to  quit smoking. Do not take medicine to help you quit smoking unless your doctor tells you to do so. To quit smoking: Quit right away Quit smoking totally, instead of slowly cutting back on how much you smoke over a period of time. Go to counseling. You are more likely to quit if you go to counseling sessions regularly. Take medicine You may take medicines to help you quit. Some medicines need a prescription, and some you can buy over-the-counter. Some medicines may contain a drug called nicotine to replace the nicotine in cigarettes. Medicines may: Help you to stop having the desire to smoke (cravings). Help to stop the problems that come when you stop smoking (withdrawal symptoms). Your doctor may ask you to use: Nicotine patches, gum, or lozenges. Nicotine inhalers or sprays. Non-nicotine medicine that is taken by mouth. Find resources Find resources and other ways to help you quit smoking and remain smoke-free after you quit. These resources are most helpful when you use them often. They include: Online chats with a Social worker. Phone quitlines. Printed Furniture conservator/restorer. Support groups or group counseling. Text messaging programs. Mobile phone apps. Use apps on your mobile phone or tablet that can help you stick to your quit plan. There are many free apps for mobile phones and tablets as well as websites. Examples include Quit Guide from the [employer] and smokefree.gov  What things can I do to make it easier to quit?  Talk to your  family and friends. Ask them to support and encourage you. Call a phone quitline (1-800-QUIT-NOW), reach out to support groups, or work with a Social worker. Ask people who smoke to not smoke around you. Avoid places that make you want to smoke, such as: Bars. Parties. Smoke-break areas at work. Spend time with people who do not smoke. Lower the stress in your life. Stress can make you want to smoke. Try these things to help your stress: Getting regular  exercise. Doing deep-breathing exercises. Doing yoga. Meditating. Doing a body scan. To do this, close your eyes, focus on one area of your body at a time from head to toe. Notice which parts of your body are tense. Try to relax the muscles in those areas. How will I feel when I quit smoking? Day 1 to 3 weeks Within the first 24 hours, you may start to have some problems that come from quitting tobacco. These problems are very bad 2-3 days after you quit, but they do not often last for more than 2-3 weeks. You may get these symptoms: Mood swings. Feeling restless, nervous, angry, or annoyed. Trouble concentrating. Dizziness. Strong desire for high-sugar foods and nicotine. Weight gain. Trouble pooping (constipation). Feeling like you may vomit (nausea). Coughing or a sore throat. Changes in how the medicines that you take for other issues work in your body. Depression. Trouble sleeping (insomnia). Week 3 and afterward After the first 2-3 weeks of quitting, you may start to notice more positive results, such as: Better sense of smell and taste. Less coughing and sore throat. Slower heart rate. Lower blood pressure. Clearer skin. Better breathing. Fewer sick days. Quitting smoking can be hard. Do not give up if you fail the first time. Some people need to try a few times before they succeed. Do your best to stick to your quit plan, and talk with yourdoctor if you have any questions or concerns. Summary Smoking tobacco is the leading cause of preventable death. Quitting smoking can be hard, but it is one of the best things that you can do for your health. When you decide to quit smoking, make a plan to help you succeed. Quit smoking right away, not slowly over a period of time. When you start quitting, seek help from your doctor, family, or friends. This information is not intended to replace advice given to you by your health care provider. Make sure you discuss any questions you  have with your healthcare provider. Document Revised: 09/26/2018 Document Reviewed: 03/22/2018 Elsevier Patient Education  [employer].

## 2020-07-22 NOTE — Assessment & Plan Note (Addendum)
-   Patient has noticed some minor improvement in dyspnea and cough with Trelegy. She would like to finish Spiriva+Breo medication she has on hand and will then switch to Trelegy 218mcg once daily. We [facility]. FU in 3 months.

## 2020-07-22 NOTE — Progress Notes (Signed)
PFT done today. 

## 2020-07-22 NOTE — Progress Notes (Signed)
_0  ID: [name], female    DOB: Dec 09, 1945, 75 y.o.   MRN: [id number]  Chief Complaint  Patient presents with   Follow-up    PFT, patient reports she is about the same with breathing.     Referring provider: Camillia Herter, NP  HPI: 75 year old female, current every day smoker (150 pack year hx). PMH significant for COPD, HTN, type 2 DM. Patient of Dr. Ander Slade, seen for initial consult on 06/10/20.   07/22/2020 Patient presents today for 4-6 week follow-up with PFTs. During last visit she was given trial of Trelegy in place of Spiriva/Breo. Patient reports that Trelegy has been working well for her and would like to continue. She noticed some improvement in shortness of breath and reports coughing a bit less. She would like to finished Spiriva/Breo medication that she has on hand at home before switching to Trelegy. She continues to smoke close to 1ppd. She has cut back from 2-2.5ppd. She smokes while drinking coffee or diet coke. She tried Wellbutrin in the past but this made her jittery and she stopped taking it. She is not interested in Chantix bit is open to using nicotine gum.   Cardiac testing:  07/07/20 Echocardiogram - Ejection fraction 65-70%, moderate left ventricular hypertrophy, grade 1 diastolic dysfunction. Normal PA systolic pressure. Moderate mitral annular calcification. Moderate calcification of the aortic valve without stenosis. Mild-moderate aortic valve regurgitation.   Pulmonary function testing: PFTs- FVC 1.97 (77%), FEV1 1.43 (72%), ratio 73, TLC 86%, DLCOunc 11.60 (54%)/ Mild-moderate obstruction with +BD response. Moderate diffusion defect   No Known Allergies   Immunization History  Administered Date(s) Administered   Fluad Quad(high Dose 65+) 10/14/2018   Influenza Split 09/28/2011, 09/15/2012, 09/23/2013   Influenza,inj,Quad PF,6+ Mos 09/21/2014, 09/11/2016   Influenza-Unspecified 09/29/2015, 10/16/2019   PFIZER(Purple Top)SARS-COV-2  Vaccination 03/12/2019, 04/01/2019, 10/20/2019, 05/27/2020   Pneumococcal Conjugate-13 02/07/2015   Pneumococcal Polysaccharide-23 12/20/2011   Tdap 09/21/2014   Zoster, Live 04/17/2012    Past Medical History:  Diagnosis Date   COPD (chronic obstructive pulmonary disease) ([place])    Diabetes mellitus    Hyperlipidemia    Hypertension    Night sweats    Rectal polyp     Tobacco History: Social History   Tobacco Use  Smoking Status Every Day   Packs/day: 2.50   Years: 60.00   Pack years: 150.00   Types: Cigarettes  Smokeless Tobacco Never  Tobacco Comments   1 ppd   Ready to quit: Not Answered Counseling given: Not Answered Tobacco comments: 1 ppd   Outpatient Medications Prior to Visit  Medication Sig Dispense Refill   Accu-Chek Softclix Lancets lancets USE UP TO 4 TIMES DAILY AS  DIRECTED 200 each 6   aspirin 81 MG tablet Take 81 mg by mouth daily.     blood glucose meter kit and supplies KIT Dispense based on patient and insurance preference. Use up to four times daily as directed. (FOR ICD-9 250.00, 250.01). 1 each 0   blood glucose meter kit and supplies Dispense based on insurance preference. Use up to four times daily as directed. (FOR ICD-10 E11.9 E11.65) AccuCheck Meter     1 each 0   Cholecalciferol (VITAMIN D3) 1000 UNITS CAPS Take 2 capsules by mouth every morning.     glucose blood (CONTOUR NEXT TEST) test strip Use as instructed 100 each 12   insulin glargine (LANTUS SOLOSTAR) 100 UNIT/ML Solostar Pen INJECT SUBCUTANEOUSLY 55  UNITS DAILY AT 10PM 45 mL  3   Insulin Pen Needle (BD PEN NEEDLE NANO U/F) 32G X 4 MM MISC Inject 1 Dose into the skin daily. 90 each 1   JANUMET 50-1000 MG tablet TAKE 1 TABLET BY MOUTH  TWICE DAILY WITH A MEAL 180 tablet 3   lisinopril (ZESTRIL) 40 MG tablet Take 1 tablet (40 mg total) by mouth daily. 30 tablet 0   metoprolol succinate (TOPROL-XL) 25 MG 24 hr tablet TAKE 1 TABLET BY MOUTH  DAILY 90 tablet 3   pravastatin (PRAVACHOL)  40 MG tablet TAKE 1 TABLET BY MOUTH IN  THE EVENING AFTER A MEAL 90 tablet 3   fluticasone furoate-vilanterol (BREO ELLIPTA) 100-25 MCG/INH AEPB USE 1 INHALATION BY MOUTH  ONCE DAILY AT THE SAME TIME EACH DAY 180 each 3   tiotropium (SPIRIVA HANDIHALER) 18 MCG inhalation capsule INHALE THE CONTENTS OF 1  CAPSULE BY MOUTH VIA  HANDIHALER DAILY 90 capsule 1   fluticasone (FLOVENT HFA) 110 MCG/ACT inhaler Inhale 1 puff into the lungs daily. (Patient not taking: Reported on 07/22/2020) 12 g 0   Fluticasone-Umeclidin-Vilant (TRELEGY ELLIPTA) 200-62.5-25 MCG/INH AEPB Inhale 1 puff into the lungs daily. (Patient not taking: Reported on 07/22/2020) 2 each 0   No facility-administered medications prior to visit.    Review of Systems  Review of Systems  Constitutional: Negative.   HENT: Negative.    Respiratory:  Positive for shortness of breath. Negative for cough.     Physical Exam  BP 138/82 (BP Location: Left Arm, Patient Position: Sitting, Cuff Size: Normal)   Pulse 91   Temp 98.2 F (36.8 C) (Oral)   Ht _0  (1.702 m)   Wt 176 lb (79.8 kg)   SpO2 98%   BMI 27.57 kg/m  Physical Exam Constitutional:      Appearance: Normal appearance.  Cardiovascular:     Rate and Rhythm: Normal rate and regular rhythm.  Pulmonary:     Effort: Pulmonary effort is normal.     Breath sounds: Normal breath sounds.  Neurological:     Mental Status: She is alert.     Lab Results:  CBC    Component Value Date/Time   WBC 9.2 07/24/2017 1052   WBC 19.9 (H) 04/18/2017 0036   RBC 5.39 (H) 07/24/2017 1052   RBC 5.33 (H) 04/18/2017 0036   HGB 14.5 07/24/2017 1052   HCT 46.4 07/24/2017 1052   PLT 358 07/24/2017 1052   MCV 86 07/24/2017 1052   MCH 26.9 07/24/2017 1052   MCH 28.7 04/18/2017 0036   MCHC 31.3 (L) 07/24/2017 1052   MCHC 32.8 04/18/2017 0036   RDW 16.0 (H) 07/24/2017 1052   LYMPHSABS 3.4 (H) 07/24/2017 1052   MONOABS 1.1 (H) 04/18/2017 0036   EOSABS 0.1 07/24/2017 1052   BASOSABS 0.0  07/24/2017 1052    BMET    Component Value Date/Time   NA 138 05/03/2020 1146   K 5.7 (H) 05/03/2020 1146   CL 98 05/03/2020 1146   CO2 25 05/03/2020 1146   GLUCOSE 249 (H) 05/03/2020 1146   GLUCOSE 251 (H) 04/17/2017 1845   BUN 15 05/03/2020 1146   CREATININE 1.15 (H) 05/03/2020 1146   CREATININE 1.14 (H) 08/31/2015 1135   CALCIUM 9.7 05/03/2020 1146   GFRNONAA 65 05/13/2019 1120   GFRNONAA 57 (L) 09/21/2014 1401   GFRAA 75 05/13/2019 1120   GFRAA 66 09/21/2014 1401    BNP No results found for: BNP  ProBNP No results found for: PROBNP  Imaging: MM 3D  SCREEN BREAST BILATERAL  Result Date: 07/05/2020 CLINICAL DATA:  Screening. EXAM: DIGITAL SCREENING BILATERAL MAMMOGRAM WITH TOMOSYNTHESIS AND CAD TECHNIQUE: Bilateral screening digital craniocaudal and mediolateral oblique mammograms were obtained. Bilateral screening digital breast tomosynthesis was performed. The images were evaluated with computer-aided detection. COMPARISON:  Previous exam(s). ACR Breast Density Category b: There are scattered areas of fibroglandular density. FINDINGS: There are no findings suspicious for malignancy. IMPRESSION: No mammographic evidence of malignancy. A result letter of this screening mammogram will be mailed directly to the patient. RECOMMENDATION: Screening mammogram in one year. (Code:SM-B-01Y) BI-RADS CATEGORY  1: Negative. Electronically Signed   By: Evangeline Dakin M.D.   On: 07/05/2020 09:01   ECHOCARDIOGRAM COMPLETE  Result Date: 07/07/2020    ECHOCARDIOGRAM REPORT   Patient Name:   KAYLYN GARROW [facility] Date of Exam: 07/07/2020 Medical Rec #:  [id number]          Height:       67.0 in Accession #:    [id number]         Weight:       176.0 lb Date of Birth:  1945/02/22         BSA:          1.915 m Patient Age:    81 years           BP:           148/90 mmHg Patient Gender: F                  HR:           82 bpm. Exam Location:  [place] Procedure: 2D Echo, Cardiac Doppler and Color  Doppler Indications:    R06.00 Dyspnea  History:        Patient has no prior history of Echocardiogram examinations.                 COPD, Signs/Symptoms:Shortness of Breath; Risk                 Factors:Hypertension, Diabetes, Dyslipidemia and Current Smoker.                 Degenerative disc disease.  Sonographer:    Diamond Nickel RCS Referring Phys: [id number] [place]  1. Left ventricular ejection fraction, by estimation, is 65 to 70%. The left ventricle has normal function. The left ventricle has no regional wall motion abnormalities. There is moderate concentric left ventricular hypertrophy. Left ventricular diastolic parameters are consistent with Grade I diastolic dysfunction (impaired relaxation).  2. Right ventricular systolic function is normal. The right ventricular size is normal. There is normal pulmonary artery systolic pressure.  3. The mitral valve is grossly normal. Trivial mitral valve regurgitation. No evidence of mitral stenosis. Moderate mitral annular calcification.  4. The aortic valve has an indeterminant number of cusps. There is moderate calcification of the aortic valve. Aortic valve regurgitation is mild to moderate. Mild to moderate aortic valve sclerosis/calcification is present, without any evidence of aortic stenosis. Comparison(s): No prior Echocardiogram. Conclusion(s)/Recommendation(s): Otherwise normal echocardiogram, with minor abnormalities described in the report. FINDINGS  Left Ventricle: Left ventricular ejection fraction, by estimation, is 65 to 70%. The left ventricle has normal function. The left ventricle has no regional wall motion abnormalities. The left ventricular internal cavity size was small. There is moderate  concentric left ventricular hypertrophy. Left ventricular diastolic parameters are consistent with Grade I diastolic dysfunction (impaired relaxation). Right Ventricle: The right ventricular size is normal. No  increase in right  ventricular wall thickness. Right ventricular systolic function is normal. There is normal pulmonary artery systolic pressure. The tricuspid regurgitant velocity is 2.64 m/s, and  with an assumed right atrial pressure of 8 mmHg, the estimated right ventricular systolic pressure is 75.6 mmHg. Left Atrium: Left atrial size was normal in size. Right Atrium: Right atrial size was normal in size. Pericardium: There is no evidence of pericardial effusion. Mitral Valve: The mitral valve is grossly normal. There is mild calcification of the mitral valve leaflet(s). Moderate mitral annular calcification. Trivial mitral valve regurgitation. No evidence of mitral valve stenosis. Tricuspid Valve: The tricuspid valve is normal in structure. Tricuspid valve regurgitation is trivial. No evidence of tricuspid stenosis. Aortic Valve: The aortic valve has an indeterminant number of cusps. There is moderate calcification of the aortic valve. Aortic valve regurgitation is mild to moderate. Aortic regurgitation PHT measures 353 msec. Mild to moderate aortic valve sclerosis/calcification is present, without any evidence of aortic stenosis. Pulmonic Valve: The pulmonic valve was not well visualized. Pulmonic valve regurgitation is not visualized. Aorta: The aortic root, ascending aorta and aortic arch are all structurally normal, with no evidence of dilitation or obstruction. Venous: The inferior vena cava was not well visualized. IAS/Shunts: The atrial septum is grossly normal.  LEFT VENTRICLE PLAX 2D LVIDd:         3.60 cm  Diastology LVIDs:         2.10 cm  LV e' medial:    3.90 cm/s LV PW:         1.30 cm  LV E/e' medial:  16.1 LV IVS:        1.30 cm  LV e' lateral:   7.13 cm/s LVOT diam:     2.20 cm  LV E/e' lateral: 8.8 LV SV:         72 LV SV Index:   38 LVOT Area:     3.80 cm  RIGHT VENTRICLE RV Basal diam:  3.10 cm RV S prime:     11.70 cm/s TAPSE (M-mode): 1.6 cm RVSP:           30.9 mmHg LEFT ATRIUM             Index        RIGHT ATRIUM           Index LA diam:        3.40 cm 1.78 cm/m  RA Pressure: 3.00 mmHg LA Vol (A2C):   31.2 ml 16.29 ml/m RA Area:     11.20 cm LA Vol (A4C):   29.6 ml 15.45 ml/m RA Volume:   22.00 ml  11.49 ml/m LA Biplane Vol: 31.2 ml 16.29 ml/m  AORTIC VALVE LVOT Vmax:   102.00 cm/s LVOT Vmean:  67.800 cm/s LVOT VTI:    0.190 m AI PHT:      353 msec  AORTA Ao Root diam: 3.30 cm MITRAL VALVE                TRICUSPID VALVE MV Area (PHT): 4.15 cm     TR Peak grad:   27.9 mmHg MV Decel Time: 183 msec     TR Vmax:        264.00 cm/s MV E velocity: 62.60 cm/s   Estimated RAP:  3.00 mmHg MV A velocity: 109.00 cm/s  RVSP:           30.9 mmHg MV E/A ratio:  0.57  SHUNTS                             Systemic VTI:  0.19 m                             Systemic Diam: 2.20 cm Buford Dresser MD Electronically signed by Buford Dresser MD Signature Date/Time: 07/07/2020/9:43:14 PM    Final      Assessment & Plan:   COPD (chronic obstructive pulmonary disease) ([place]) - Patient has noticed some minor improvement in dyspnea and cough with Trelegy. She would like to finish Spiriva+Breo medication she has on hand and will then switch to Trelegy 266mg once daily. We have send a prescription in to [facility]. FU in 3 months.   Diastolic dysfunction - Echocardiogram in June 2022 showed grade 1 DD with normal EF, moderate LVH and mild-moderate aortic regurgitation, recommend referral to cardiology for consult d/t persistent dyspnea despite optimum treatment for underlying COPD   Tobacco user - Current smoker, 1ppd. Strongly encourage smoking cessation. Recommend she taper amount she is smoking and can use NRT such as nicotine gum. She has tried and failed Wellbutrin and nicotine patches in the past. She is not interested in chantix for smoking cessation.    EMartyn Ehrich NP 07/22/2020

## 2020-07-22 NOTE — Assessment & Plan Note (Signed)
-   Current smoker, 1ppd. Strongly encourage smoking cessation. Recommend she taper amount she is smoking and can use NRT such as nicotine gum. She has tried and failed Wellbutrin and nicotine patches in the past. She is not interested in chantix for smoking cessation.

## 2020-07-22 NOTE — Assessment & Plan Note (Signed)
-   Echocardiogram in June 2022 showed grade 1 DD with normal EF, moderate LVH and mild-moderate aortic regurgitation, recommend referral to cardiology for consult d/t persistent dyspnea despite optimum treatment for underlying COPD

## 2020-07-28 DIAGNOSIS — H2512 Age-related nuclear cataract, left eye: Secondary | ICD-10-CM | POA: Diagnosis not present

## 2020-07-28 DIAGNOSIS — H25812 Combined forms of age-related cataract, left eye: Secondary | ICD-10-CM | POA: Diagnosis not present

## 2020-08-01 ENCOUNTER — Other Ambulatory Visit: Payer: Self-pay | Admitting: Family

## 2020-08-01 ENCOUNTER — Other Ambulatory Visit: Payer: Self-pay

## 2020-08-01 ENCOUNTER — Ambulatory Visit (INDEPENDENT_AMBULATORY_CARE_PROVIDER_SITE_OTHER): Payer: Medicare Other

## 2020-08-01 ENCOUNTER — Ambulatory Visit (INDEPENDENT_AMBULATORY_CARE_PROVIDER_SITE_OTHER): Payer: Medicare Other | Admitting: Family

## 2020-08-01 ENCOUNTER — Encounter: Payer: Self-pay | Admitting: Family

## 2020-08-01 VITALS — BP 143/83 | HR 81 | Temp 97.9°F | Resp 15 | Ht 67.01 in | Wt 171.8 lb

## 2020-08-01 DIAGNOSIS — R21 Rash and other nonspecific skin eruption: Secondary | ICD-10-CM | POA: Diagnosis not present

## 2020-08-01 DIAGNOSIS — Z1329 Encounter for screening for other suspected endocrine disorder: Secondary | ICD-10-CM

## 2020-08-01 DIAGNOSIS — Z130 Encounter for screening for diseases of the blood and blood-forming organs and certain disorders involving the immune mechanism: Secondary | ICD-10-CM

## 2020-08-01 DIAGNOSIS — I700 Atherosclerosis of aorta: Secondary | ICD-10-CM

## 2020-08-01 DIAGNOSIS — Z0000 Encounter for general adult medical examination without abnormal findings: Secondary | ICD-10-CM | POA: Diagnosis not present

## 2020-08-01 DIAGNOSIS — R040 Epistaxis: Secondary | ICD-10-CM

## 2020-08-01 DIAGNOSIS — R042 Hemoptysis: Secondary | ICD-10-CM

## 2020-08-01 DIAGNOSIS — R059 Cough, unspecified: Secondary | ICD-10-CM | POA: Diagnosis not present

## 2020-08-01 DIAGNOSIS — I10 Essential (primary) hypertension: Secondary | ICD-10-CM

## 2020-08-01 DIAGNOSIS — Z13228 Encounter for screening for other metabolic disorders: Secondary | ICD-10-CM | POA: Diagnosis not present

## 2020-08-01 DIAGNOSIS — R9389 Abnormal findings on diagnostic imaging of other specified body structures: Secondary | ICD-10-CM

## 2020-08-01 DIAGNOSIS — E119 Type 2 diabetes mellitus without complications: Secondary | ICD-10-CM | POA: Diagnosis not present

## 2020-08-01 DIAGNOSIS — Z23 Encounter for immunization: Secondary | ICD-10-CM

## 2020-08-01 DIAGNOSIS — Z0100 Encounter for examination of eyes and vision without abnormal findings: Secondary | ICD-10-CM | POA: Diagnosis not present

## 2020-08-01 DIAGNOSIS — E785 Hyperlipidemia, unspecified: Secondary | ICD-10-CM

## 2020-08-01 DIAGNOSIS — Z122 Encounter for screening for malignant neoplasm of respiratory organs: Secondary | ICD-10-CM

## 2020-08-01 LAB — POCT GLYCOSYLATED HEMOGLOBIN (HGB A1C): Hemoglobin A1C: 9.2 % — AB (ref 4.0–5.6)

## 2020-08-01 MED ORDER — LISINOPRIL 40 MG PO TABS: 40.0000 mg | ORAL_TABLET | Freq: Every day | ORAL | 0 refills | Status: DC

## 2020-08-01 MED ORDER — METOPROLOL SUCCINATE ER 25 MG PO TB24: 25.0000 mg | ORAL_TABLET | Freq: Every day | ORAL | 0 refills | Status: DC

## 2020-08-01 MED ORDER — JANUMET 50-1000 MG PO TABS: 1.0000 | ORAL_TABLET | Freq: Two times a day (BID) | ORAL | 0 refills | Status: DC

## 2020-08-01 NOTE — Progress Notes (Signed)
Pt presents for annual sequential medicare wellness visit  Experiencing nosebleeds and bilateral side pain for approx 2-3 weeks have coughed up blood during the nosebleeds  Pt needs refill on lisinopril

## 2020-08-01 NOTE — Progress Notes (Signed)
There is an increased prominence. A CT of chest ordered.   Will continue with lung cancer screening as discussed in office.  Also, a referral placed to Cardiology for aortic atherosclerosis. Their office should call patient within 2 weeks with appointment details.

## 2020-08-01 NOTE — Progress Notes (Signed)
There is an increased prominence on today's x-ray. A CT of chest ordered.   Will continue with lung cancer screening.  Also, a referral placed to Cardiology for aortic atherosclerosis. Their office should call patient within 2 weeks with appointment details.

## 2020-08-01 NOTE — Patient Instructions (Signed)
Preventive Care 76 Years and Older, Female Preventive care refers to lifestyle choices and visits with your health care provider that can promote health and wellness. This includes: A yearly physical exam. This is also called an annual wellness visit. Regular dental and eye exams. Immunizations. Screening for certain conditions. Healthy lifestyle choices, such as: Eating a healthy diet. Getting regular exercise. Not using drugs or products that contain nicotine and tobacco. Limiting alcohol use. What can I expect for my preventive care visit? Physical exam Your health care provider will check your: Height and weight. These may be used to calculate your BMI (body mass index). BMI is a measurement that tells if you are at a healthy weight. Heart rate and blood pressure. Body temperature. Skin for abnormal spots. Counseling Your health care provider may ask you questions about your: Past medical problems. Family's medical history. Alcohol, tobacco, and drug use. Emotional well-being. Home life and relationship well-being. Sexual activity. Diet, exercise, and sleep habits. History of falls. Memory and ability to understand (cognition). Work and work Statistician. Pregnancy and menstrual history. Access to firearms. What immunizations do I need?  Vaccines are usually given at various ages, according to a schedule. Your health care provider will recommend vaccines for you based on your age, medicalhistory, and lifestyle or other factors, such as travel or where you work. What tests do I need? Blood tests Lipid and cholesterol levels. These may be checked every 5 years, or more often depending on your overall health. Hepatitis C test. Hepatitis B test. Screening Lung cancer screening. You may have this screening every year starting at age 44 if you have a 30-pack-year history of smoking and currently smoke or have quit within the past 15 years. Colorectal cancer screening. All  adults should have this screening starting at age 39 and continuing until age 65. Your health care provider may recommend screening at age 61 if you are at increased risk. You will have tests every 1-10 years, depending on your results and the type of screening test. Diabetes screening. This is done by checking your blood sugar (glucose) after you have not eaten for a while (fasting). You may have this done every 1-3 years. Mammogram. This may be done every 1-2 years. Talk with your health care provider about how often you should have regular mammograms. Abdominal aortic aneurysm (AAA) screening. You may need this if you are a current or former smoker. BRCA-related cancer screening. This may be done if you have a family history of breast, ovarian, tubal, or peritoneal cancers. Other tests STD (sexually transmitted disease) testing, if you are at risk. Bone density scan. This is done to screen for osteoporosis. You may have this done starting at age 54. Talk with your health care provider about your test results, treatment options,and if necessary, the need for more tests. Follow these instructions at home: Eating and drinking  Eat a diet that includes fresh fruits and vegetables, whole grains, lean protein, and low-fat dairy products. Limit your intake of foods with high amounts of sugar, saturated fats, and salt. Take vitamin and mineral supplements as recommended by your health care provider. Do not drink alcohol if your health care provider tells you not to drink. If you drink alcohol: Limit how much you have to 0-1 drink a day. Be aware of how much alcohol is in your drink. In the [place] one drink equals one 12 oz bottle of beer (355 mL), one 5 oz glass of wine (148 mL), or one 1  oz glass of hard liquor (44 mL).  Lifestyle Take daily care of your teeth and gums. Brush your teeth every morning and night with fluoride toothpaste. Floss one time each day. Stay active. Exercise for at  least 30 minutes 5 or more days each week. Do not use any products that contain nicotine or tobacco, such as cigarettes, e-cigarettes, and chewing tobacco. If you need help quitting, ask your health care provider. Do not use drugs. If you are sexually active, practice safe sex. Use a condom or other form of protection in order to prevent STIs (sexually transmitted infections). Talk with your health care provider about taking a low-dose aspirin or statin. Find healthy ways to cope with stress, such as: Meditation, yoga, or listening to music. Journaling. Talking to a trusted person. Spending time with friends and family. Safety Always wear your seat belt while driving or riding in a vehicle. Do not drive: If you have been drinking alcohol. Do not ride with someone who has been drinking. When you are tired or distracted. While texting. Wear a helmet and other protective equipment during sports activities. If you have firearms in your house, make sure you follow all gun safety procedures. What's next? Visit your health care provider once a year for an annual wellness visit. Ask your health care provider how often you should have your eyes and teeth checked. Stay up to date on all vaccines. This information is not intended to replace advice given to you by your health care provider. Make sure you discuss any questions you have with your healthcare provider. Document Revised: 12/23/2019 Document Reviewed: 12/26/2017 Elsevier Patient Education  2022 [employer].

## 2020-08-01 NOTE — Progress Notes (Signed)
Subjective:   [name] is a 75 y.o. female who presents for Medicare Annual (Subsequent) preventive examination.  Review of Systems    Cardiac Risk Factors include: advanced age (>29mn, >>11women);diabetes mellitus  HYPERTENSION FOLLOW-UP: 05/03/2020: - Blood pressure at goal during today's visit. - Continue Lisinopril 40 mg daily as prescribed .  - Continue Metoprolol 25 mg 24-hour tablet daily as prescribed.  - Follow-up with primary provider in 3 months or sooner if needed.  08/01/2020: Currently taking: see medication list Have you taken your blood pressure medication today: [] Yes [x] No  Med Adherence: [x] Yes    [] No Medication side effects: [] Yes    [x] No Home Monitoring?: [x] Yes    [] No Smoking [x] Yes, less than 1 pack daily  2.DIABETES TYPE 2 FOLLOW-UP: 04/22/2020: - Last hemoglobin A1c obtained 02/17/2020 at goal at 7.7%, goal < 8%. Next hemoglobin A1c due May 2022. - Continue Sitagliptin-Metformin (Janumet) 50-1000 mg tablet twice daily as prescribed. - Follow-up with primary provider in 4 weeks or sooner if needed.   08/01/2020: Checking blood sugars at home. Yesterday's readings were 101, 122, 140. Has not been watching what she eats. She is not ready to begin additional diabetes medications today. Says she will watch what she eats. She is agreeable to returning in 4 weeks for diabetes checkup.   3. COUGHING UP BLOOD: Doesn't happen often maybe 50/50 chance. It is dark red. Can feel phlegm sitting in throat. When she coughs forcefully to try to bring up the phlegm it doesn't help much. Denies chest pain and shortness of breath outside of her normal routine. Down to less than 1 pack daily smoking.  4. NOSEBLEED: Ongoing for a couple years. Only the left nostril. In the past was told it may be related to nose being dry or air being dry. Most recent occurrence lasted 2 minutes. Happened at least 6 to 7 times in the last 2 months. Nosebleeds happen with  coughing up blood but not always.  5. RASH: Left lower extremity. Present for many years, endorses itching and dry skin. She is not ready for referral to Dermatology as of present.  Objective:    Today's Vitals   08/01/20 0850  BP: (!) 156/85  Pulse: 81  Resp: 15  Temp: 97.9 F (36.6 C)  SpO2: 93%  Weight: 171 lb 12.8 oz (77.9 kg)  Height: 5' 7.01" (1.702 m)  PainSc: 0-No pain   Body mass index is 26.9 kg/m.  Physical Exam HENT:     Head: Normocephalic and atraumatic.  Eyes:     Extraocular Movements: Extraocular movements intact.     Pupils: Pupils are equal, round, and reactive to light.  Cardiovascular:     Rate and Rhythm: Normal rate and regular rhythm.     Pulses: Normal pulses.     Heart sounds: Normal heart sounds.  Pulmonary:     Effort: Pulmonary effort is normal.     Breath sounds: Normal breath sounds.  Musculoskeletal:     Cervical back: Normal range of motion and neck supple.  Skin:    Comments: Left lower extremity with hyperpigmented dry rash. Only half of left lower extremity appears this way. Patient reports has been present for many years, endorses itching and dry skin. She is not ready for referral to Dermatology as of present.   Neurological:     General: No focal deficit present.     Mental Status: She is alert and  oriented to person, place, and time.  Psychiatric:        Mood and Affect: Mood normal.        Behavior: Behavior normal.    Advanced Directives 08/01/2020 07/24/2017 04/30/2017 06/06/2016 05/31/2015  Does Patient Have a Medical Advance Directive? _0   Would patient like information on creating a medical advance directive? Yes (Inpatient - patient defers creating a medical advance directive at this time - Information given) No - Patient declined No - Patient declined Yes (ED - Information included in AVS) -    Current Medications (verified) Outpatient Encounter Medications as of 08/01/2020  Medication Sig   Accu-Chek Softclix  Lancets lancets USE UP TO 4 TIMES DAILY AS  DIRECTED   aspirin 81 MG tablet Take 81 mg by mouth daily.   blood glucose meter kit and supplies KIT Dispense based on patient and insurance preference. Use up to four times daily as directed. (FOR ICD-9 250.00, 250.01).   blood glucose meter kit and supplies Dispense based on insurance preference. Use up to four times daily as directed. (FOR ICD-10 E11.9 E11.65) AccuCheck Meter       Cholecalciferol (VITAMIN D3) 1000 UNITS CAPS Take 2 capsules by mouth every morning.   [START ON 09/15/2020] Fluticasone-Umeclidin-Vilant (TRELEGY ELLIPTA) 200-62.5-25 MCG/INH AEPB Inhale 1 puff into the lungs daily.   glucose blood (CONTOUR NEXT TEST) test strip Use as instructed   Insulin Pen Needle (BD PEN NEEDLE NANO U/F) 32G X 4 MM MISC Inject 1 Dose into the skin daily.   JANUMET 50-1000 MG tablet TAKE 1 TABLET BY MOUTH  TWICE DAILY WITH A MEAL   metoprolol succinate (TOPROL-XL) 25 MG 24 hr tablet TAKE 1 TABLET BY MOUTH  DAILY   pravastatin (PRAVACHOL) 40 MG tablet TAKE 1 TABLET BY MOUTH IN  THE EVENING AFTER A MEAL   insulin glargine (LANTUS SOLOSTAR) 100 UNIT/ML Solostar Pen INJECT SUBCUTANEOUSLY 55  UNITS DAILY AT 10PM   lisinopril (ZESTRIL) 40 MG tablet Take 1 tablet (40 mg total) by mouth daily.   [DISCONTINUED] nicotine polacrilex (NICORETTE) 4 MG gum Take 1 each (4 mg total) by mouth as needed for smoking cessation.   No facility-administered encounter medications on file as of 08/01/2020.    Allergies (verified) Patient has no known allergies.   History: Past Medical History:  Diagnosis Date   COPD (chronic obstructive pulmonary disease) ([place])    Diabetes mellitus    Hyperlipidemia    Hypertension    Night sweats    Rectal polyp    Past Surgical History:  Procedure Laterality Date   ABDOMINAL HYSTERECTOMY  2000   fibroids; ovaries intact.   BACK SURGERY     plate put in back   CHOLECYSTECTOMY N/A 05/01/2017   Procedure: LAPAROSCOPIC  CHOLECYSTECTOMY WITH INTRAOPERATIVE CHOLANGIOGRAM;  Surgeon: Donnie Mesa, MD;  Location: [facility];  Service: General;  Laterality: N/A;   RECTAL POLYPECTOMY  05/16/2010   TVAdenoma polyp   TUBAL LIGATION     Family History  Problem Relation Age of Onset   Diabetes Sister    Diabetes Brother    Hypertension Brother    Cancer Mother 63       colon(age 19) and lung (age 32)   Diabetes Mother    Heart disease Mother 9       CABG   Hyperlipidemia Mother    Cancer Father 65       stomach   Diabetes Maternal Grandmother    Mental illness  Sister    Social History   Socioeconomic History   Marital status: Divorced    Spouse name: Not on file   Number of children: 3   Years of education: Not on file   Highest education level: Not on file  Occupational History   Occupation: retired    Comment: [employer]  Tobacco Use   Smoking status: Every Day    Packs/day: 2.50    Years: 60.00    Pack years: 150.00    Types: Cigarettes   Smokeless tobacco: Never   Tobacco comments:    1 ppd  Vaping Use   Vaping Use: Never used  Substance and Sexual Activity   Alcohol use: No   Drug use: No   Sexual activity: Not on file  Other Topics Concern   Not on file  Social History Narrative   Marital status: divorced; not dating in 2019 but interested slightly.        Children:  3 children; 7 seven grandchildren; 1 gg      Lives: with oldest daughter, 2 grandchildren, 4 gg, 1 friend of daughters, daughter's boyfriend.       Employment: retired [employer] age 49.      Tobacco; 1ppd x 50 years.  Not interested in 2019.      Alcohol: none      Exercise: none in 2019[place]      ADLs:   independent with ADLs; no assistant devices      Advanced Directives:  None; DNR/DNI; no HCPOA.    Social Determinants of Health   Financial Resource Strain: Not on file  Food Insecurity: Not on file  Transportation Needs: Not on file  Physical Activity: Not on file  Stress:  Not on file  Social Connections: Not on file    Tobacco Counseling Ready to quit: Not Answered Counseling given: Not Answered Tobacco comments: 1 ppd  Clinical Intake:  Pre-visit preparation completed: Yes  Pain : 0-10 Pain Score: 0-No pain  Diabetes: Yes CBG done?: No Did pt. bring in CBG monitor from home?: No  Diabetic? Yes   Interpreter Needed?: No   Activities of Daily Living In your present state of health, do you have any difficulty performing the following activities: 08/01/2020 04/22/2020  Hearing? N N  Vision? N N  Difficulty concentrating or making decisions? N N  Walking or climbing stairs? N N  Dressing or bathing? N N  Doing errands, shopping? N N  Preparing Food and eating ? N -  Using the Toilet? N -  In the past six months, have you accidently leaked urine? N -  Do you have problems with loss of bowel control? N -  Managing your Medications? N -  Managing your Finances? N -  Housekeeping or managing your Housekeeping? N -  Some recent data might be hidden    Patient Care Team: Camillia Herter, NP as PCP - General (Nurse Practitioner) [facility] Sherrilyn Rist, MD as consulting Pulmonologist  Fransico Him, MD as consulting Cardiologist  Indicate any recent Medical Services you may have received from other than [facility] providers in the past year (date may be approximate). None  Assessment:   This is a routine wellness examination for [facility].  Hearing/Vision screen No results found.  Dietary issues and exercise activities discussed: Exercise limited by: None identified   Goals Addressed   None   Depression Screen [facility] 2/9 Scores 08/01/2020 05/03/2020  02/17/2020 12/14/2019 05/13/2019 02/23/2019 02/10/2019  PHQ - 2 Score 0 0 0 0 0 0 0  PHQ- 9 Score - 0 - - - - -    Fall Risk Fall Risk  08/01/2020 04/22/2020 02/17/2020 12/14/2019 09/09/2019  Falls in the past year? 0 0 0 0 0  Comment - - - - -  Number falls in past yr: 0 0  0 0 0  Injury with Fall? 0 0 0 0 0  Risk for fall due to : No Fall Risks No Fall Risks - - -  Follow up _0     FALL RISK PREVENTION PERTAINING TO THE HOME: Any stairs in or around the home? Yes  If so, are there any without handrails? No Home free of loose throw rugs in walkways, pet beds, electrical cords, etc? Yes Adequate lighting in your home to reduce risk of falls? Yes   ASSISTIVE DEVICES UTILIZED TO PREVENT FALLS: Life alert? No  Use of a cane, walker or w/c? No  Grab bars in the bathroom? No  Shower chair or bench in shower? Yes  Elevated toilet seat or a handicapped toilet? No   TIMED UP AND GO: Was the test performed? Yes .  Gait slow and steady without use of assistive device  Cognitive Function: MMSE - Mini Mental State Exam 08/01/2020  Orientation to time 5  Orientation to Place 5  Registration 3  Attention/ Calculation 5  Recall 3  Language- name 2 objects 2  Language- repeat 1  Language- follow 3 step command 3  Language- read & follow direction 1  Write a sentence 1  Copy design 1  Total score 30   Immunizations Immunization History  Administered Date(s) Administered   Fluad Quad(high Dose 65+) 10/14/2018   Influenza Split 09/28/2011, 09/15/2012, 09/23/2013   Influenza,inj,Quad PF,6+ Mos 09/21/2014, 09/11/2016   Influenza-Unspecified 09/29/2015, 10/16/2019   PFIZER(Purple Top)SARS-COV-2 Vaccination 03/12/2019, 04/01/2019, 10/20/2019, 05/27/2020   Pneumococcal Conjugate-13 02/07/2015   Pneumococcal Polysaccharide-23 12/20/2011   Tdap 09/21/2014   Zoster, Live 04/17/2012    TDAP status: Up to date  Pneumococcal vaccine status: Up to date  Covid-19 vaccine status: Completed vaccines  Qualifies for Shingles Vaccine? Yes, declined   Screening Tests Health Maintenance  Topic Date Due   Zoster Vaccines- Shingrix (1 of  2) Never done   URINE MICROALBUMIN  01/12/2020   OPHTHALMOLOGY EXAM  06/15/2020   FOOT EXAM  12/13/2020 (Originally 10/14/2019)   INFLUENZA VACCINE  08/15/2020   HEMOGLOBIN A1C  02/01/2021   TETANUS/TDAP  09/20/2024   COLONOSCOPY (Pts 45-52yr Insurance coverage will need to be confirmed)  08/13/2029   DEXA SCAN  Completed   COVID-19 Vaccine  Completed   Hepatitis C Screening  Completed   PNA vac Low Risk Adult  Completed   HPV VACCINES  Aged Out    Health Maintenance  Health Maintenance Due  Topic Date Due   Zoster Vaccines- Shingrix (1 of 2) Never done   URINE MICROALBUMIN  01/12/2020   OPHTHALMOLOGY EXAM  06/15/2020    Colorectal cancer screening: Type of screening: Colonoscopy. Completed 08/14/2019. Repeat every 10 years  Mammogram status: Completed 07/04/2020. Repeat every year  Bone Density status: Completed 08/20/2016. Results reflect: Bone density results: NORMAL. Repeat every as needed years.  Lung Cancer Screening: (Low Dose CT Chest recommended if Age 76-80years, 30 pack-year currently smoking OR have quit w/in 15years.) does qualify.   Lung  Cancer Screening Referral: ordered 08/01/2020   Additional Screening:  Hepatitis C Screening: does not qualify; Completed 09/21/2014  Vision Screening: Recommended annual ophthalmology exams for early detection of glaucoma and other disorders of the eye. Is the patient up to date with their annual eye exam?  Yes  Who is the provider or what is the name of the office in which the patient attends annual eye exams? [facility] If pt is not established with a provider, would they like to be referred to a provider to establish care?  Not applicable .   Dental Screening: Recommended annual dental exams for proper oral hygiene. She is already established and has appointment next month with Dr. Quincy Simmonds.  Community Resource Referral / Chronic Care Management: CRR required this visit?  No   CCM required this visit?  No      Plan:   1. Medicare annual wellness visit, subsequent: - Counseled on 150 minutes of exercise per week as tolerated, healthy eating (including decreased daily intake of saturated fats, cholesterol, added sugars, sodium), STI prevention, and routine healthcare maintenance.  2. Screening for metabolic disorder: - KTG25+WLSL to check kidney function, liver function, and electrolyte balance.  - CMP14+EGFR  3. Screening for deficiency anemia: - CBC to screen for anemia. - CBC  4. Thyroid disorder screen: - TSH to check thyroid function.  - TSH  5. Screening for lung cancer: - CT chest lung cancer screening for further evaluation.  - CT CHEST LUNG CA SCREEN LOW DOSE W/O CM; Future  6. Need for shingles vaccine: - Patient declined  7. Essential hypertension: - Continue Lisinopril and Metoprolol Succinate as prescribed. - Counseled on blood pressure goal of less than 140/90, low-sodium, DASH diet, medication compliance, 150 minutes of moderate intensity exercise per week as tolerated. Discussed medication compliance, adverse effects. - Follow-up with primary provider in 3 months or sooner if needed. - lisinopril (ZESTRIL) 40 MG tablet; Take 1 tablet (40 mg total) by mouth daily.  Dispense: 90 tablet; Refill: 0 - metoprolol succinate (TOPROL-XL) 25 MG 24 hr tablet; Take 1 tablet (25 mg total) by mouth daily.  Dispense: 90 tablet; Refill: 0  8. Type 2 diabetes mellitus without complication, without long-term current use of insulin ([place]): - Hemoglobin A1c not at goal today at 9.2%, goal < 8%. This is increased from previous hemoglobin A1c of 7.7% on 02/17/2020.  - Patient endorses dietary indiscretions. She is not ready to begin additional diabetic medications today. Reports she would like to change her diet to see if that helps.  - Continue Sitagliptin-Metformin as prescribed.  - Discussed the importance of healthy eating habits, low-carbohydrate diet, low-sugar diet, regular aerobic exercise (at  least 150 minutes a week as tolerated) and medication compliance to achieve or maintain control of diabetes. - Microalbumin / creatinine urine ratio to check kidney function. - Follow-up with primary provider in 4 weeks for repeat hemoglobin A1c.  - POCT glycosylated hemoglobin (Hb A1C) - Microalbumin / creatinine urine ratio - sitaGLIPtin-metformin (JANUMET) 50-1000 MG tablet; Take 1 tablet by mouth 2 (two) times daily with a meal.  Dispense: 180 tablet; Refill: 0  9. Diabetic eye exam [facility]): - Patient has appointment August 2022 with her already established eye doctor.   10. Hyperlipidemia, unspecified hyperlipidemia type: -Practice low-fat heart healthy diet and at least 150 minutes of moderate intensity exercise weekly as tolerated.  - Continue Pravastatin as prescribed.  - Lipid panel to screen for high cholesterol.  - Follow-up with primary provider as scheduled.  -  Lipid panel  11. Coughing up blood: - Happening intermittently.  - Diagnostic chest x-ray for further evaluation.  - She has COPD and is a current smoker see #5.  - Will have her follow-up with her current Pulmonologist.  - DG Chest 2 View; Future  12. Left-sided nosebleed: - Happening intermittently.  - Patient has history of the same. Referral to ENT for further evaluation and management.  - Ambulatory referral to ENT  13. Rash and nonspecific skin eruption: - Left lower extremity with hyperpigmented dry rash. Only half of left lower extremity appears this way. Patient reports has been present for many years, endorses itching and dry skin. She is not ready for referral to Dermatology as of present. - Follow-up with primary provider as scheduled.   Patient was given clear instructions to go to Emergency Department or return to medical center if symptoms don't improve, worsen, or new problems develop.The patient verbalized understanding.  I have personally reviewed and noted the following in the patient's chart:    Medical and social history Use of alcohol, tobacco or illicit drugs  Current medications and supplements including opioid prescriptions.  Functional ability and status Nutritional status Physical activity Advanced directives List of other physicians Hospitalizations, surgeries, and ER visits in previous 12 months Vitals Screenings to include cognitive, depression, and falls Referrals and appointments  In addition, I have reviewed and discussed with patient certain preventive protocols, quality metrics, and best practice recommendations. A written personalized care plan for preventive services as well as general preventive health recommendations were provided to patient.     Camillia Herter, NP   08/01/2020

## 2020-08-02 LAB — CMP14+EGFR
ALT: 13 IU/L (ref 0–32)
AST: 13 IU/L (ref 0–40)
Albumin/Globulin Ratio: 1.7 (ref 1.2–2.2)
Albumin: 4.3 g/dL (ref 3.7–4.7)
Alkaline Phosphatase: 121 IU/L (ref 44–121)
BUN/Creatinine Ratio: 13 (ref 12–28)
BUN: 11 mg/dL (ref 8–27)
Bilirubin Total: 0.2 mg/dL (ref 0.0–1.2)
CO2: 22 mmol/L (ref 20–29)
Calcium: 9.8 mg/dL (ref 8.7–10.3)
Chloride: 100 mmol/L (ref 96–106)
Creatinine, Ser: 0.88 mg/dL (ref 0.57–1.00)
Globulin, Total: 2.6 g/dL (ref 1.5–4.5)
Glucose: 105 mg/dL — ABNORMAL HIGH (ref 65–99)
Potassium: 5 mmol/L (ref 3.5–5.2)
Sodium: 140 mmol/L (ref 134–144)
Total Protein: 6.9 g/dL (ref 6.0–8.5)
eGFR: 68 mL/min/{1.73_m2} (ref >59)

## 2020-08-02 LAB — TSH: TSH: 1.64 u[IU]/mL (ref 0.450–4.500)

## 2020-08-02 LAB — CBC
Hematocrit: 45.7 % (ref 34.0–46.6)
Hemoglobin: 14.9 g/dL (ref 11.1–15.9)
MCH: 27.2 pg (ref 26.6–33.0)
MCHC: 32.6 g/dL (ref 31.5–35.7)
MCV: 83 fL (ref 79–97)
Platelets: 298 10*3/uL (ref 150–450)
RBC: 5.48 x10E6/uL — ABNORMAL HIGH (ref 3.77–5.28)
RDW: 14.3 % (ref 11.7–15.4)
WBC: 8.1 10*3/uL (ref 3.4–10.8)

## 2020-08-02 LAB — MICROALBUMIN / CREATININE URINE RATIO
Creatinine, Urine: 137.7 mg/dL
Microalb/Creat Ratio: 112 mg/g creat — ABNORMAL HIGH (ref 0–29)
Microalbumin, Urine: 154.5 ug/mL

## 2020-08-02 LAB — LIPID PANEL
Chol/HDL Ratio: 3.2 ratio (ref 0.0–4.4)
Cholesterol, Total: 162 mg/dL (ref 100–199)
HDL: 50 mg/dL (ref >39)
LDL Chol Calc (NIH): 87 mg/dL (ref 0–99)
Triglycerides: 140 mg/dL (ref 0–149)
VLDL Cholesterol Cal: 25 mg/dL (ref 5–40)

## 2020-08-02 LAB — HEPATIC FUNCTION PANEL: Bilirubin, Direct: <0.1 mg/dL (ref 0.00–0.40)

## 2020-08-03 NOTE — Progress Notes (Signed)
Kidney function normal.   Liver function normal.   Thyroid function normal.   Cholesterol normal. Continue Pravastatin for high cholesterol.   Diabetes discussed in office.   No anemia.   Red blood cells mildly elevated. This may be related to patient is current smoker and has COPD.   Microalbumin/Creatinine ratio shows patient has protein spilling over into the urine. This could mean that diabetes is affecting the kidneys. Good diabetes control will help. The medication called Lisinopril that you are currently taking will help protect the kidneys from the effects of diabetes.

## 2020-08-11 ENCOUNTER — Telehealth: Payer: Self-pay | Admitting: Pulmonary Disease

## 2020-08-11 DIAGNOSIS — H25811 Combined forms of age-related cataract, right eye: Secondary | ICD-10-CM | POA: Diagnosis not present

## 2020-08-11 DIAGNOSIS — H2511 Age-related nuclear cataract, right eye: Secondary | ICD-10-CM | POA: Diagnosis not present

## 2020-08-11 NOTE — Telephone Encounter (Signed)
No. We don't need to

## 2020-08-11 NOTE — Telephone Encounter (Signed)
Hey there Dr. Jenetta Downer, looks like the PCP Breigh Annett Minette Brine NP has ordered a CT W/contrast and Low dose CT screening for patient on 08/01/20. Do you still want me to send in CT w/o contrast as well? Thanks!

## 2020-08-11 NOTE — Telephone Encounter (Signed)
Order CT scan of the chest without contrast  Diagnosis: Abnormal pulmonary function test showing decreased diffusing capacity

## 2020-08-28 ENCOUNTER — Other Ambulatory Visit: Payer: Self-pay | Admitting: Family

## 2020-08-28 DIAGNOSIS — J449 Chronic obstructive pulmonary disease, unspecified: Secondary | ICD-10-CM

## 2020-08-29 NOTE — Progress Notes (Signed)
Patient ID: [name], female    DOB: 11-09-1945  MRN: [id number]  CC: Diabetes Follow-Up  Subjective: [name] is a 75 y.o. female who presents for diabetes follow-up.   Her concerns today include:  DIABETES TYPE 2 FOLLOW-UP: 08/01/2020: - Hemoglobin A1c not at goal today at 9.2%, goal < 8%. This is increased from previous hemoglobin A1c of 7.7% on 02/17/2020.  - Patient endorses dietary indiscretions. She is not ready to begin additional diabetic medications today. Reports she would like to change her diet to see if that helps.  - Continue Sitagliptin-Metformin as prescribed.  - Follow-up with primary provider in 4 weeks for repeat hemoglobin A1c.   08/31/2020: Doing well on current regimen. No side effects. No issues/concerns.  2. SKIN CONCERN: Reports itching and rash underneath bilateral breasts. Has been ongoing for some time. Usually worsening during summer months. Using over-the-counter antifungal powder for feet without relief.    Patient Active Problem List   Diagnosis Date Noted   Diastolic dysfunction 12/75/1700   Cyst of right ovary 07/28/2017   Pure hypercholesterolemia 09/10/2016   Thyroid nodule 09/10/2016   COPD (chronic obstructive pulmonary disease) ([place]) 09/10/2016   Family history of colon cancer in mother 06/06/2016   Nuclear sclerotic cataract of both eyes 04/08/2013   Type 2 diabetes mellitus without complication, with long-term current use of insulin ([place]) 09/13/2011   HTN (hypertension) 09/13/2011   Tobacco user 09/13/2011   DDD (degenerative disc disease), lumbar 09/13/2011   History of rectal polypectomy 08/31/2010     Current Outpatient Medications on File Prior to Visit  Medication Sig Dispense Refill   Accu-Chek Softclix Lancets lancets USE UP TO 4 TIMES DAILY AS  DIRECTED 200 each 6   aspirin 81 MG tablet Take 81 mg by mouth daily.     blood glucose meter kit and supplies KIT Dispense based on patient and insurance preference. Use  up to four times daily as directed. (FOR ICD-9 250.00, 250.01). 1 each 0   blood glucose meter kit and supplies Dispense based on insurance preference. Use up to four times daily as directed. (FOR ICD-10 E11.9 E11.65) AccuCheck Meter     1 each 0   Cholecalciferol (VITAMIN D3) 1000 UNITS CAPS Take 2 capsules by mouth every morning.     [START ON 09/15/2020] Fluticasone-Umeclidin-Vilant (TRELEGY ELLIPTA) 200-62.5-25 MCG/INH AEPB Inhale 1 puff into the lungs daily. 60 each 11   glucose blood (CONTOUR NEXT TEST) test strip Use as instructed 100 each 12   insulin glargine (LANTUS SOLOSTAR) 100 UNIT/ML Solostar Pen INJECT SUBCUTANEOUSLY 55  UNITS DAILY AT 10PM 45 mL 3   Insulin Pen Needle (BD PEN NEEDLE NANO U/F) 32G X 4 MM MISC Inject 1 Dose into the skin daily. 90 each 1   lisinopril (ZESTRIL) 40 MG tablet Take 1 tablet (40 mg total) by mouth daily. 90 tablet 0   metoprolol succinate (TOPROL-XL) 25 MG 24 hr tablet Take 1 tablet (25 mg total) by mouth daily. 90 tablet 0   pravastatin (PRAVACHOL) 40 MG tablet TAKE 1 TABLET BY MOUTH IN  THE EVENING AFTER A MEAL 90 tablet 3   No current facility-administered medications on file prior to visit.    No Known Allergies  Social History   Socioeconomic History   Marital status: Divorced    Spouse name: Not on file   Number of children: 3   Years of education: Not on file   Highest education level: Not on file  Occupational History   Occupation: retired    Comment: [employer]  Tobacco Use   Smoking status: Every Day    Packs/day: 2.50    Years: 60.00    Pack years: 150.00    Types: Cigarettes   Smokeless tobacco: Never   Tobacco comments:    1 ppd  Vaping Use   Vaping Use: Never used  Substance and Sexual Activity   Alcohol use: No   Drug use: No   Sexual activity: Not on file  Other Topics Concern   Not on file  Social History Narrative   Marital status: divorced; not dating in 2019 but interested slightly.        Children:   3 children; 7 seven grandchildren; 1 gg      Lives: with oldest daughter, 2 grandchildren, 91 gg, 1 friend of daughters, daughter's boyfriend.       Employment: retired [employer] [place] age 3.      Tobacco; 1ppd x 50 years.  Not interested in 2019.      Alcohol: none      Exercise: none in 2019. [place]      ADLs:   independent with ADLs; no assistant devices      Advanced Directives:  None; DNR/DNI; no HCPOA.    Social Determinants of Health   Financial Resource Strain: Not on file  Food Insecurity: Not on file  Transportation Needs: Not on file  Physical Activity: Not on file  Stress: Not on file  Social Connections: Not on file  Intimate Partner Violence: Not on file    Family History  Problem Relation Age of Onset   Diabetes Sister    Diabetes Brother    Hypertension Brother    Cancer Mother 30       colon(age 75) and lung (age 54)   Diabetes Mother    Heart disease Mother 28       CABG   Hyperlipidemia Mother    Cancer Father 32       stomach   Diabetes Maternal Grandmother    Mental illness Sister     Past Surgical History:  Procedure Laterality Date   ABDOMINAL HYSTERECTOMY  2000   fibroids; ovaries intact.   BACK SURGERY     plate put in back   CHOLECYSTECTOMY N/A 05/01/2017   Procedure: LAPAROSCOPIC CHOLECYSTECTOMY WITH INTRAOPERATIVE CHOLANGIOGRAM;  Surgeon: Donnie Mesa, MD;  Location: [facility];  Service: General;  Laterality: N/A;   RECTAL POLYPECTOMY  05/16/2010   TVAdenoma polyp   TUBAL LIGATION      ROS: Review of Systems Negative except as stated above  PHYSICAL EXAM: BP 138/77 (BP Location: Right Arm, Patient Position: Sitting, Cuff Size: Normal)   Pulse 90   Temp 98.6 F (37 C)   Resp 18   Ht 5' 7.01" (1.702 m)   Wt 168 lb 9.6 oz (76.5 kg)   SpO2 96%   BMI 26.40 kg/m   Physical Exam HENT:     Head: Normocephalic and atraumatic.  Eyes:     Extraocular Movements: Extraocular movements intact.     Conjunctiva/sclera:  Conjunctivae normal.     Pupils: Pupils are equal, round, and reactive to light.  Cardiovascular:     Rate and Rhythm: Normal rate and regular rhythm.     Pulses: Normal pulses.     Heart sounds: Normal heart sounds.  Pulmonary:     Effort: Pulmonary effort is normal.     Breath  sounds: Normal breath sounds.  Musculoskeletal:     Cervical back: Normal range of motion and neck supple.  Neurological:     General: No focal deficit present.     Mental Status: She is alert and oriented to person, place, and time.  Psychiatric:        Mood and Affect: Mood normal.        Behavior: Behavior normal.   ASSESSMENT AND PLAN: 1. Type 2 diabetes mellitus without complication, with long-term current use of insulin ([place]): - Hemoglobin A1c today close to goal at 8.2%, goal < 8%. This is improved from previous hemoglobin A1c of 9.2% on 08/01/2020. - Continue Sitagliptin-Metformin as prescribed. - Continue Lisinopril to assist with kidney protection. - Discussed the importance of healthy eating habits, low-carbohydrate diet, low-sugar diet, regular aerobic exercise (at least 150 minutes a week as tolerated) and medication compliance to achieve or maintain control of diabetes. - Follow-up with primary provider in 3 months or sooner if needed.  - POCT glycosylated hemoglobin (Hb A1C) - sitaGLIPtin-metformin (JANUMET) 50-1000 MG tablet; Take 1 tablet by mouth 2 (two) times daily with a meal.  Dispense: 180 tablet; Refill: 0  2. Candidiasis of breast: - Begin Nystatin powder as prescribed.  - Follow-up with primary provider as scheduled.  - nystatin (MYCOSTATIN/NYSTOP) powder; Apply 1 application topically 3 (three) times daily.  Dispense: 60 g; Refill: 2    Patient was given the opportunity to ask questions.  Patient verbalized understanding of the plan and was able to repeat key elements of the plan. Patient was given clear instructions to go to Emergency Department or return to medical center if  symptoms don't improve, worsen, or new problems develop.The patient verbalized understanding.   Orders Placed This Encounter  Procedures   POCT glycosylated hemoglobin (Hb A1C)     Requested Prescriptions   Signed Prescriptions Disp Refills   sitaGLIPtin-metformin (JANUMET) 50-1000 MG tablet 180 tablet 0    Sig: Take 1 tablet by mouth 2 (two) times daily with a meal.   nystatin (MYCOSTATIN/NYSTOP) powder 60 g 2    Sig: Apply 1 application topically 3 (three) times daily.    Return in about 3 months (around 12/01/2020) for Diabetes.  Camillia Herter, NP

## 2020-08-31 ENCOUNTER — Other Ambulatory Visit: Payer: Self-pay

## 2020-08-31 ENCOUNTER — Ambulatory Visit: Payer: Medicare Other | Admitting: Family

## 2020-08-31 ENCOUNTER — Ambulatory Visit (INDEPENDENT_AMBULATORY_CARE_PROVIDER_SITE_OTHER): Payer: Medicare Other | Admitting: Family

## 2020-08-31 ENCOUNTER — Encounter: Payer: Self-pay | Admitting: Family

## 2020-08-31 VITALS — BP 138/77 | HR 90 | Temp 98.6°F | Resp 18 | Ht 67.01 in | Wt 168.6 lb

## 2020-08-31 DIAGNOSIS — E119 Type 2 diabetes mellitus without complications: Secondary | ICD-10-CM | POA: Diagnosis not present

## 2020-08-31 DIAGNOSIS — B3789 Other sites of candidiasis: Secondary | ICD-10-CM | POA: Diagnosis not present

## 2020-08-31 DIAGNOSIS — Z794 Long term (current) use of insulin: Secondary | ICD-10-CM | POA: Diagnosis not present

## 2020-08-31 LAB — POCT GLYCOSYLATED HEMOGLOBIN (HGB A1C): Hemoglobin A1C: 8.2 % — AB (ref 4.0–5.6)

## 2020-08-31 MED ORDER — JANUMET 50-1000 MG PO TABS: 1.0000 | ORAL_TABLET | Freq: Two times a day (BID) | ORAL | 0 refills | Status: DC

## 2020-08-31 MED ORDER — NYSTATIN 100000 UNIT/GM EX POWD: 1.0000 "application " | Freq: Three times a day (TID) | CUTANEOUS | 2 refills | Status: DC

## 2020-08-31 NOTE — Progress Notes (Signed)
Pt presents for diabetes follow-up, pt reports that rash is present under both breast symptoms include mild itchiness

## 2020-09-05 ENCOUNTER — Telehealth: Payer: Self-pay | Admitting: Family

## 2020-09-05 NOTE — Telephone Encounter (Signed)
1) Medication(s) Requested (by name): insulin glargine (LANTUS SOLOSTAR) 100 UNIT/ML Solostar Pen  2) Pharmacy of Choice: [facility]  ([place], [place]   3) Special Requests:   Approved medications will be sent to the pharmacy, we will reach out if there is an issue.  Requests made after 3pm may not be addressed until the following business day!  If a patient is unsure of the name of the medication(s) please note and ask patient to call back when they are able to provide all info, do not send to responsible party until all information is available!

## 2020-09-08 ENCOUNTER — Other Ambulatory Visit: Payer: Self-pay | Admitting: Family

## 2020-09-08 DIAGNOSIS — E119 Type 2 diabetes mellitus without complications: Secondary | ICD-10-CM

## 2020-09-08 MED ORDER — BD PEN NEEDLE NANO U/F 32G X 4 MM MISC: 1.0000 | Freq: Every day | 1 refills | Status: DC

## 2020-09-08 MED ORDER — LANTUS SOLOSTAR 100 UNIT/ML ~~LOC~~ SOPN: PEN_INJECTOR | SUBCUTANEOUS | 1 refills | Status: DC

## 2020-09-08 NOTE — Telephone Encounter (Signed)
Please call patient to confirm if she has been taking Lantus 55 units daily at bedtime. This medication has not been discussed with me at any previous office visits. I suspect this is so because the last prescription from previous provider was ordered for a 365 day supply back in August 2021. Therefore, patient is just now needing refills. Once we confirm details with patient will be agreeable to refilling.

## 2020-09-08 NOTE — Telephone Encounter (Signed)
Insulin Glargine (Lantus Solostar) refilled per patient request.

## 2020-09-08 NOTE — Telephone Encounter (Signed)
Spoke w/pt taking 55 units of Lantus

## 2020-09-14 ENCOUNTER — Ambulatory Visit (INDEPENDENT_AMBULATORY_CARE_PROVIDER_SITE_OTHER): Payer: Medicare Other | Admitting: Otolaryngology

## 2020-09-14 ENCOUNTER — Other Ambulatory Visit: Payer: Self-pay

## 2020-09-14 DIAGNOSIS — R040 Epistaxis: Secondary | ICD-10-CM

## 2020-09-14 NOTE — Progress Notes (Signed)
HPI: [name] is a 75 y.o. female who presents is referred by her PCP for evaluation of left-sided epistaxis.  This initially began about a month ago.  She relates that she had a bad nosebleed on August 8 and has had several nosebleeds since that time but not as bad.  The bleeding is always been from the left side and may occur at night.  She stated that she had a nosebleed a couple days ago but that was not too bad.  She has a record of when she has her nosebleeds and had no nosebleeds last week recorded. She does not have any trouble breathing through her nose.. She is on no blood thinners except for baby aspirin.  Past Medical History:  Diagnosis Date   COPD (chronic obstructive pulmonary disease) ([place])    Diabetes mellitus    Hyperlipidemia    Hypertension    Night sweats    Rectal polyp    Past Surgical History:  Procedure Laterality Date   ABDOMINAL HYSTERECTOMY  2000   fibroids; ovaries intact.   BACK SURGERY     plate put in back   CHOLECYSTECTOMY N/A 05/01/2017   Procedure: LAPAROSCOPIC CHOLECYSTECTOMY WITH INTRAOPERATIVE CHOLANGIOGRAM;  Surgeon: Donnie Mesa, MD;  Location: [facility];  Service: General;  Laterality: N/A;   RECTAL POLYPECTOMY  05/16/2010   TVAdenoma polyp   TUBAL LIGATION     Social History   Socioeconomic History   Marital status: Divorced    Spouse name: Not on file   Number of children: 3   Years of education: Not on file   Highest education level: Not on file  Occupational History   Occupation: retired    Comment: [employer]  Tobacco Use   Smoking status: Every Day    Packs/day: 2.50    Years: 60.00    Pack years: 150.00    Types: Cigarettes   Smokeless tobacco: Never   Tobacco comments:    1 ppd  Vaping Use   Vaping Use: Never used  Substance and Sexual Activity   Alcohol use: No   Drug use: No   Sexual activity: Not on file  Other Topics Concern   Not on file  Social History Narrative   Marital status: divorced; not  dating in 2019 but interested slightly.        Children:  3 children; 7 seven grandchildren; 1 gg      Lives: with oldest daughter, 2 grandchildren, 80 gg, 1 friend of daughters, daughter's boyfriend.       Employment: retired [employer] [place] age 10.      Tobacco; 1ppd x 50 years.  Not interested in 2019.      Alcohol: none      Exercise: none in 2019. [place].      ADLs:   independent with ADLs; no assistant devices      Advanced Directives:  None; DNR/DNI; no HCPOA.    Social Determinants of Health   Financial Resource Strain: Not on file  Food Insecurity: Not on file  Transportation Needs: Not on file  Physical Activity: Not on file  Stress: Not on file  Social Connections: Not on file   Family History  Problem Relation Age of Onset   Diabetes Sister    Diabetes Brother    Hypertension Brother    Cancer Mother 15       colon(age 37) and lung (age 64)   Diabetes Mother  Heart disease Mother 54       CABG   Hyperlipidemia Mother    Cancer Father 67       stomach   Diabetes Maternal Grandmother    Mental illness Sister    No Known Allergies Prior to Admission medications   Medication Sig Start Date End Date Taking? Authorizing Provider  Accu-Chek Softclix Lancets lancets USE UP TO 4 TIMES DAILY AS  DIRECTED 05/17/20   Camillia Herter, NP  aspirin 81 MG tablet Take 81 mg by mouth daily.    [provider]  blood glucose meter kit and supplies KIT Dispense based on patient and insurance preference. Use up to four times daily as directed. (FOR ICD-9 250.00, 250.01). 02/17/15   Wardell Honour, MD  blood glucose meter kit and supplies Dispense based on insurance preference. Use up to four times daily as directed. (FOR ICD-10 E11.9 E11.65) AccuCheck Meter     03/18/19   Forrest Moron, MD  Cholecalciferol (VITAMIN D3) 1000 UNITS CAPS Take 2 capsules by mouth every morning.    [provider]  Fluticasone-Umeclidin-Vilant (TRELEGY ELLIPTA)  200-62.5-25 MCG/INH AEPB Inhale 1 puff into the lungs daily. 09/15/20   Martyn Ehrich, NP  glucose blood (CONTOUR NEXT TEST) test strip Use as instructed 05/17/20   Camillia Herter, NP  insulin glargine (LANTUS SOLOSTAR) 100 UNIT/ML Solostar Pen INJECT SUBCUTANEOUSLY 55  UNITS DAILY AT 10PM 09/08/20   Camillia Herter, NP  Insulin Pen Needle (BD PEN NEEDLE NANO U/F) 32G X 4 MM MISC Inject 1 Dose into the skin daily. 09/08/20   Camillia Herter, NP  lisinopril (ZESTRIL) 40 MG tablet Take 1 tablet (40 mg total) by mouth daily. 08/01/20 10/30/20  Camillia Herter, NP  metoprolol succinate (TOPROL-XL) 25 MG 24 hr tablet Take 1 tablet (25 mg total) by mouth daily. 08/01/20 10/30/20  Camillia Herter, NP  nystatin (MYCOSTATIN/NYSTOP) powder Apply 1 application topically 3 (three) times daily. 08/31/20   Camillia Herter, NP  pravastatin (PRAVACHOL) 40 MG tablet TAKE 1 TABLET BY MOUTH IN  THE EVENING AFTER A MEAL 03/25/20   Maximiano Coss, NP  sitaGLIPtin-metformin (JANUMET) 50-1000 MG tablet Take 1 tablet by mouth 2 (two) times daily with a meal. 08/31/20 11/29/20  Camillia Herter, NP     Positive ROS: Otherwise negative  All other systems have been reviewed and were otherwise negative with the exception of those mentioned in the HPI and as above.  Physical Exam: Constitutional: Alert, well-appearing, no acute distress Ears: External ears without lesions or tenderness. Ear canals are clear bilaterally with intact, clear TMs.  Nasal: External nose without lesions. Septum slightly deviated to the right.  On anterior rhinoscopy I cannot identify definite site of origin of bleeding from the anterior septum or inferior anterior turbinate.  On nasal endoscopy the nasal cavity was otherwise clear with no intranasal masses noted and can identify definite site of origin of bleeding in the posterior nasal cavity.  Likewise the nasopharynx was clear with no abnormalities noted. Oral: Lips and gums without lesions. Tongue  and palate mucosa without lesions. Posterior oropharynx clear. Neck: No palpable adenopathy or masses Respiratory: Breathing comfortably  Skin: No facial/neck lesions or rash noted.  Procedures  Assessment: History of left-sided epistaxis.  Plan: I cannot identify definite site of origin of her epistaxis in the office today on anterior rhinoscopy or nasal endoscopy. Reviewed with her concerning using a cottonball and Afrin to help stop nosebleed  if she has any further bleeding and to follow-up here if she has frequent nosebleeds or a bad nosebleed for recheck and cauterization if needed. But on today's visit she cannot start any bleeding I cannot identify definite site of origin of her epistaxis.   Radene Journey, MD   CC:

## 2020-09-26 ENCOUNTER — Other Ambulatory Visit: Payer: Self-pay

## 2020-09-26 DIAGNOSIS — Z794 Long term (current) use of insulin: Secondary | ICD-10-CM

## 2020-09-26 DIAGNOSIS — E119 Type 2 diabetes mellitus without complications: Secondary | ICD-10-CM

## 2020-09-26 MED ORDER — LANTUS SOLOSTAR 100 UNIT/ML ~~LOC~~ SOPN: PEN_INJECTOR | SUBCUTANEOUS | 3 refills | Status: DC

## 2020-09-26 NOTE — Progress Notes (Signed)
Lantus solostar refilled sent to [facility]

## 2020-10-02 ENCOUNTER — Other Ambulatory Visit: Payer: Self-pay | Admitting: Family

## 2020-10-02 DIAGNOSIS — I10 Essential (primary) hypertension: Secondary | ICD-10-CM

## 2020-10-06 ENCOUNTER — Other Ambulatory Visit: Payer: Self-pay | Admitting: Family

## 2020-10-06 DIAGNOSIS — I10 Essential (primary) hypertension: Secondary | ICD-10-CM

## 2020-10-11 ENCOUNTER — Other Ambulatory Visit: Payer: Self-pay

## 2020-10-11 ENCOUNTER — Ambulatory Visit: Payer: Medicare Other | Admitting: Cardiology

## 2020-10-11 ENCOUNTER — Encounter: Payer: Self-pay | Admitting: Cardiology

## 2020-10-11 VITALS — BP 150/84 | HR 86 | Ht 67.0 in | Wt 170.4 lb

## 2020-10-11 DIAGNOSIS — I10 Essential (primary) hypertension: Secondary | ICD-10-CM

## 2020-10-11 DIAGNOSIS — R0600 Dyspnea, unspecified: Secondary | ICD-10-CM

## 2020-10-11 DIAGNOSIS — E7800 Pure hypercholesterolemia, unspecified: Secondary | ICD-10-CM

## 2020-10-11 DIAGNOSIS — I351 Nonrheumatic aortic (valve) insufficiency: Secondary | ICD-10-CM

## 2020-10-11 DIAGNOSIS — R0609 Other forms of dyspnea: Secondary | ICD-10-CM

## 2020-10-11 MED ORDER — METOPROLOL TARTRATE 100 MG PO TABS: 100.0000 mg | ORAL_TABLET | Freq: Once | ORAL | 0 refills | Status: DC

## 2020-10-11 NOTE — Patient Instructions (Addendum)
Medication Instructions:  Your physician recommends that you continue on your current medications as directed. Please refer to the Current Medication list given to you today.  *If you need a refill on your cardiac medications before your next appointment, please call your pharmacy*   Lab Work: TODAY: BMET and BNP If you have labs (blood work) drawn today and your tests are completely normal, you will receive your results only by: [place] (if you have MyChart) OR A paper copy in the mail If you have any lab test that is abnormal or we need to change your treatment, we will call you to review the results.   Testing/Procedures: Your physician has requested that you have coronary CTA. Please see below for further instructions.     Follow-Up: At [facility], you and your health needs are our priority.  As part of our continuing mission to provide you with exceptional heart care, we have created designated Provider Care Teams.  These Care Teams include your primary Cardiologist (physician) and Advanced Practice Providers (APPs -  Physician Assistants and Nurse Practitioners) who all work together to provide you with the care you need, when you need it.  Your next appointment:   1 year(s)  The format for your next appointment:   In Person  Provider:   You may see Fransico Him, MD or one of the following Advanced Practice Providers on your designated Care Team:   Melina Copa, PA-C Ermalinda Barrios, PA-C  Other Instructions:   Your cardiac CT will be scheduled at:   [facility] [address] [place], [place] [address] [phone]   Please arrive at the [facility] main entrance (entrance A) of [facility] 30 minutes prior to test start time. Proceed to the [facility] Radiology Department (first floor) to check-in and test prep.   Please follow these instructions carefully (unless otherwise directed):  On the Night Before the Test: Be sure to  Drink plenty of water. Do not consume any caffeinated/decaffeinated beverages or chocolate 12 hours prior to your test. Do not take any antihistamines 12 hours prior to your test.   On the Day of the Test: Drink plenty of water until 1 hour prior to the test. Do not eat any food 4 hours prior to the test. You may take your regular medications prior to the test.  Take metoprolol (Lopressor) two hours prior to test. HOLD Toprol XL the morning of your test. FEMALES- please wear underwire-free bra if available, avoid dresses & tight clothing       After the Test: Drink plenty of water. After receiving IV contrast, you may experience a mild flushed feeling. This is normal. On occasion, you may experience a mild rash up to 24 hours after the test. This is not dangerous. If this occurs, you can take Benadryl 25 mg and increase your fluid intake. If you experience trouble breathing, this can be serious. If it is severe call 911 IMMEDIATELY. If it is mild, please call our office. If you take any of these medications: Glipizide/Metformin, Avandament, Glucavance, please do not take 48 hours after completing test unless otherwise instructed.  Please allow 2-4 weeks for scheduling of routine cardiac CTs. Some insurance companies require a pre-authorization which may delay scheduling of this test.   For non-scheduling related questions, please contact the cardiac imaging nurse navigator should you have any questions/concerns: Marchia Bond, Cardiac Imaging Nurse Navigator Gordy Clement, Cardiac Imaging Nurse Navigator [facility]  Direct Office Dial: [phone]   For scheduling needs, including cancellations and rescheduling, please call [place], [phone].

## 2020-10-11 NOTE — Progress Notes (Signed)
Cardiology CONSULT Note    Date:  10/11/2020   ID:  [name], [name] [birth year], MRN [id number]  PCP:  Colleen Herter, NP  Cardiologist:  Colleen Him, MD   Chief Complaint  Patient presents with   Shortness of Breath   Hypertension   Hyperlipidemia     History of Present Illness:  [name] is a 75 y.o. female who is being seen today for the evaluation of DOE at the request of Colleen Ehrich, NP.  This is a 75yo AAF with a hx of COPD, HTN, HLD, DM who is referred by her PCP for evaluation of DOE.  She denies any chest pain or pressure.  She cannot walk very far now due to DOE.  She gets SOB just walking in her house and up stairs.  She has hx of COPD and thinks her SOB is pulmonary.  It has not gotten any worse recently.  She denies any LE edema, dizziness, or palpitations.  She has not had any PND, orthopnea.     Past Medical History:  Diagnosis Date   COPD (chronic obstructive pulmonary disease) ([facility])    Diabetes mellitus    Hyperlipidemia    Hypertension    Night sweats    Rectal polyp     Past Surgical History:  Procedure Laterality Date   ABDOMINAL HYSTERECTOMY  2000   fibroids; ovaries intact.   BACK SURGERY     plate put in back   CHOLECYSTECTOMY N/A 05/01/2017   Procedure: LAPAROSCOPIC CHOLECYSTECTOMY WITH INTRAOPERATIVE CHOLANGIOGRAM;  Surgeon: Colleen Mesa, MD;  Location: [facility];  Service: General;  Laterality: N/A;   RECTAL POLYPECTOMY  05/16/2010   TVAdenoma polyp   TUBAL LIGATION      Current Medications: Current Meds  Medication Sig   Accu-Chek Softclix Lancets lancets USE UP TO 4 TIMES DAILY AS  DIRECTED   aspirin 81 MG tablet Take 81 mg by mouth daily.   blood glucose meter kit and supplies KIT Dispense based on patient and insurance preference. Use up to four times daily as directed. (FOR ICD-9 250.00, 250.01).   blood glucose meter kit and supplies Dispense based on insurance preference. Use up to four times daily as  directed. (FOR ICD-10 E11.9 E11.65) AccuCheck Meter       Cholecalciferol (VITAMIN D3) 1000 UNITS CAPS Take 2 capsules by mouth every morning.   Fluticasone-Umeclidin-Vilant (TRELEGY ELLIPTA) 200-62.5-25 MCG/INH AEPB Inhale 1 puff into the lungs daily.   glucose blood (CONTOUR NEXT TEST) test strip Use as instructed   insulin glargine (LANTUS SOLOSTAR) 100 UNIT/ML Solostar Pen INJECT SUBCUTANEOUSLY 55  UNITS DAILY AT 10PM   Insulin Pen Needle (BD PEN NEEDLE NANO U/F) 32G X 4 MM MISC Inject 1 Dose into the skin daily.   lisinopril (ZESTRIL) 40 MG tablet TAKE 1 TABLET BY MOUTH  DAILY   metoprolol succinate (TOPROL-XL) 25 MG 24 hr tablet TAKE 1 TABLET BY MOUTH  DAILY   nystatin (MYCOSTATIN/NYSTOP) powder Apply 1 application topically 3 (three) times daily.   pravastatin (PRAVACHOL) 40 MG tablet TAKE 1 TABLET BY MOUTH IN  THE EVENING AFTER A MEAL   sitaGLIPtin-metformin (JANUMET) 50-1000 MG tablet Take 1 tablet by mouth 2 (two) times daily with a meal.    Allergies:   Patient has no known allergies.   Social History   Socioeconomic History   Marital status: Divorced    Spouse name: Not on file   Number of children: 3   Years  of education: Not on file   Highest education level: Not on file  Occupational History   Occupation: retired    Comment: [employer]   Smoking status: Every Day    Packs/day: 2.50    Years: 60.00    Pack years: 150.00    Types: Cigarettes   Smokeless tobacco: Never   Tobacco comments:    1 ppd  Vaping Use   Vaping Use: Never used  Substance and Sexual Activity   Alcohol use: No   Drug use: No   Sexual activity: Not on file  Other Topics Concern   Not on file  Social History Narrative   Marital status: divorced; not dating in 2019 but interested slightly.        Children:  3 children; 7 seven grandchildren; 1 gg      Lives: with oldest daughter, 2 grandchildren, 35 gg, 1 friend of daughters, daughter's boyfriend.       Employment:  retired [employer] age 10.      Tobacco; 1ppd x 50 years.  Not interested in 2019.      Alcohol: none      Exercise: none in 2019. [place]      ADLs:   independent with ADLs; no assistant devices      Advanced Directives:  None; DNR/DNI; no HCPOA.    Social Determinants of Health   Financial Resource Strain: Not on file  Food Insecurity: Not on file  Transportation Needs: Not on file  Physical Activity: Not on file  Stress: Not on file  Social Connections: Not on file     Family History:  The patient's family history includes Cancer (age of onset: 13) in her father; Cancer (age of onset: 90) in her mother; Diabetes in her brother, maternal grandmother, mother, and sister; Heart disease (age of onset: 14) in her mother; Hyperlipidemia in her mother; Hypertension in her brother; Mental illness in her sister.   ROS:   Please see the history of present illness.    ROS All other systems reviewed and are negative.  No flowsheet data found.     PHYSICAL EXAM:   VS:  BP (!) 150/84   Pulse 86   Ht $R'5\' 7"'nT$  (1.702 m)   Wt 170 lb 6.4 oz (77.3 kg)   SpO2 97%   BMI 26.69 kg/m    GEN: Well nourished, well developed, in no acute distress  HEENT: normal  Neck: no JVD, carotid bruits, or masses Cardiac: RRR; no murmurs, rubs, or gallops,no edema.  Intact distal pulses bilaterally.  Respiratory:  clear to auscultation bilaterally, normal work of breathing GI: soft, nontender, nondistended, + BS MS: no deformity or atrophy  Skin: warm and dry, no rash Neuro:  Alert and Oriented x 3, Strength and sensation are intact Psych: euthymic mood, full affect  Wt Readings from Last 3 Encounters:  10/11/20 170 lb 6.4 oz (77.3 kg)  08/31/20 168 lb 9.6 oz (76.5 kg)  08/01/20 171 lb 12.8 oz (77.9 kg)      Studies/Labs Reviewed:   EKG:  EKG is ordered today.  The ekg ordered today demonstrates NSR with no ST changes  Recent Labs: 08/01/2020: ALT 13; BUN 11; Creatinine,  Ser 0.88; Hemoglobin 14.9; Platelets 298; Potassium 5.0; Sodium 140; TSH 1.640   Lipid Panel    Component Value Date/Time   CHOL 162 08/01/2020 0908   TRIG 140 08/01/2020 0908   HDL 50 08/01/2020 0908  CHOLHDL 3.2 08/01/2020 0908   CHOLHDL 3.0 02/07/2015 1314   VLDL 31 (H) 02/07/2015 1314   LDLCALC 87 08/01/2020 0908   LDLDIRECT 85 04/17/2012 1349  Additional studies/ records that were reviewed today include:  OV notes from PCP  ASSESSMENT:    1. DOE (dyspnea on exertion)   2. Primary hypertension   3. Pure hypercholesterolemia   4. Nonrheumatic aortic valve insufficiency      PLAN:  In order of problems listed above:  DOE -she has not had any chest pain but her DOE may be her anginal equivalent -she has CRFs including HTN, HLD, DM, tobacco abuse and fm hx of CAD -2D echo in June 2022 showed normal LVF with moderate LVH and G1DD with normal PAP -check BNP -I will get a coronary CTA to assess for CAD  2.  HTN -BP borderline controlled on exam today but she has white coat HTN and says that it has stable at home -Continue prescription drug management with Lisinopril $RemoveBeforeD'40mg'gvTNigwKDtQBgm$  daily, Toprol XL $RemoveB'25mg'ocDsyDYo$  daily with PRN refills   3.  HLD -LDL goal < 100 -I have personally reviewed and interpreted outside labs performed by patient's PCP which showed LDL 87, HDL 50, ALT 13 in July 2022   4.  Aortic insufficiency -mild to moderate by echo with mild AVSC -repeat echo in 1 year  Time Spent: 25 minutes total time of encounter, including 15 minutes spent in face-to-face patient care on the date of this encounter. This time includes coordination of care and counseling regarding above mentioned problem list. Remainder of non-face-to-face time involved reviewing chart documents/testing relevant to the patient encounter and documentation in the medical record. I have independently reviewed documentation from referring provider  Medication Adjustments/Labs and Tests Ordered: Current medicines  are reviewed at length with the patient today.  Concerns regarding medicines are outlined above.  Medication changes, Labs and Tests ordered today are listed in the Patient Instructions below.  There are no Patient Instructions on file for this visit.  Followup with me in 1 year for AI   Signed, Colleen Him, MD  10/11/2020 11:18 AM    [facility] [address], [place],   [address] Phone: [phone]; Fax: [phone]

## 2020-10-11 NOTE — Addendum Note (Signed)
Addended by: Antonieta Iba on: 10/11/2020 11:26 AM   Modules accepted: Orders

## 2020-10-12 LAB — BASIC METABOLIC PANEL
BUN/Creatinine Ratio: 12 (ref 12–28)
BUN: 12 mg/dL (ref 8–27)
CO2: 21 mmol/L (ref 20–29)
Calcium: 9.7 mg/dL (ref 8.7–10.3)
Chloride: 103 mmol/L (ref 96–106)
Creatinine, Ser: 1.03 mg/dL — ABNORMAL HIGH (ref 0.57–1.00)
Glucose: 160 mg/dL — ABNORMAL HIGH (ref 70–99)
Potassium: 5.1 mmol/L (ref 3.5–5.2)
Sodium: 140 mmol/L (ref 134–144)
eGFR: 57 mL/min/{1.73_m2} — ABNORMAL LOW (ref >59)

## 2020-10-12 LAB — PRO B NATRIURETIC PEPTIDE: NT-Pro BNP: 74 pg/mL (ref 0–738)

## 2020-10-18 ENCOUNTER — Ambulatory Visit (HOSPITAL_COMMUNITY): Payer: Medicare Other

## 2020-10-21 ENCOUNTER — Telehealth (HOSPITAL_COMMUNITY): Payer: Self-pay | Admitting: *Deleted

## 2020-10-21 NOTE — Telephone Encounter (Signed)
Reaching out to patient to offer assistance regarding upcoming cardiac imaging study; pt verbalizes understanding of appt date/time, parking situation and where to check in, pre-test NPO status and medications ordered, and verified current allergies; name and call back number provided for further questions should they arise  Gordy Clement RN Navigator Cardiac Imaging [facility] [phone] office [phone] cell  Patient to take 100mg  metoprolol tartrate two hours prior to cardiac CT scan and hold her daily metoprolol succinate.

## 2020-10-25 ENCOUNTER — Ambulatory Visit (HOSPITAL_COMMUNITY)
Admission: RE | Admit: 2020-10-25 | Discharge: 2020-10-25 | Disposition: A | Discharge status: Home / Self Care | Payer: Medicare Other | Source: Ambulatory Visit | Attending: Cardiology | Admitting: Cardiology

## 2020-10-25 ENCOUNTER — Other Ambulatory Visit: Payer: Self-pay | Admitting: Cardiovascular Disease

## 2020-10-25 ENCOUNTER — Encounter (HOSPITAL_COMMUNITY): Payer: Self-pay

## 2020-10-25 ENCOUNTER — Ambulatory Visit (HOSPITAL_COMMUNITY)
Admission: RE | Admit: 2020-10-25 | Discharge: 2020-10-25 | Disposition: A | Discharge status: Home / Self Care | Payer: Medicare Other | Source: Ambulatory Visit | Attending: Cardiovascular Disease | Admitting: Cardiovascular Disease

## 2020-10-25 ENCOUNTER — Other Ambulatory Visit (HOSPITAL_COMMUNITY): Payer: Self-pay | Admitting: Emergency Medicine

## 2020-10-25 ENCOUNTER — Other Ambulatory Visit: Payer: Self-pay

## 2020-10-25 DIAGNOSIS — R079 Chest pain, unspecified: Secondary | ICD-10-CM

## 2020-10-25 DIAGNOSIS — I10 Essential (primary) hypertension: Secondary | ICD-10-CM | POA: Diagnosis not present

## 2020-10-25 DIAGNOSIS — I2510 Atherosclerotic heart disease of native coronary artery without angina pectoris: Secondary | ICD-10-CM | POA: Diagnosis not present

## 2020-10-25 DIAGNOSIS — I517 Cardiomegaly: Secondary | ICD-10-CM | POA: Insufficient documentation

## 2020-10-25 DIAGNOSIS — R931 Abnormal findings on diagnostic imaging of heart and coronary circulation: Secondary | ICD-10-CM | POA: Insufficient documentation

## 2020-10-25 DIAGNOSIS — R0609 Other forms of dyspnea: Secondary | ICD-10-CM | POA: Diagnosis present

## 2020-10-25 MED ORDER — NITROGLYCERIN 0.4 MG SL SUBL
SUBLINGUAL_TABLET | SUBLINGUAL | Status: AC
  Filled 2020-10-25: qty 2

## 2020-10-25 MED ORDER — METOPROLOL TARTRATE 5 MG/5ML IV SOLN
INTRAVENOUS | Status: AC
  Filled 2020-10-25: qty 20

## 2020-10-25 MED ORDER — METOPROLOL TARTRATE 5 MG/5ML IV SOLN
10.0000 mg | INTRAVENOUS | Status: DC | PRN
  Administered 2020-10-25: 10 mg via INTRAVENOUS

## 2020-10-25 MED ORDER — IOHEXOL 350 MG/ML SOLN
95.0000 mL | Freq: Once | INTRAVENOUS | Status: AC | PRN
  Administered 2020-10-25: 95 mL via INTRAVENOUS

## 2020-10-25 MED ORDER — NITROGLYCERIN 0.4 MG SL SUBL
0.8000 mg | SUBLINGUAL_TABLET | Freq: Once | SUBLINGUAL | Status: AC
  Administered 2020-10-25: 0.8 mg via SUBLINGUAL

## 2020-10-26 ENCOUNTER — Telehealth: Payer: Self-pay | Admitting: Cardiology

## 2020-10-26 NOTE — Telephone Encounter (Signed)
Diane from [facility] radiology calling in an addendum to CT findings.  Addendum made this morning: Mildly enlarged right hilar lymph nodes, largest measuring 12 mm. Recommend follow-up with complete chest CT with contrast in 3 months.   Probable right adrenal mass, although this is incompletely visualized on this exam. Adrenal protocol abdomen CT without and with contrast is recommended for further evaluation.   Will forward to MD & RN for follow up on this....Marland KitchenMarland Kitchen

## 2020-10-26 NOTE — Telephone Encounter (Signed)
[facility] calling with results.

## 2020-11-14 NOTE — Progress Notes (Addendum)
Cardiology Office Note    Date:  11/16/2020   ID:  Colleen, Douglas 03/28/1945, MRN [id number]  PCP:  Camillia Herter, NP  Cardiologist:  Fransico Him, MD  Electrophysiologist:  None   Chief Complaint: discuss cath  History of Present Illness:   [name] is a 75 y.o. female with history of HTN, HLD, DM, COPD who was recently seen in clinic for evaluation of exertional dyspnea. No chest pain. Prior 2D Echo 06/2020 EF 65-70%, mod LVH, grade 1 DD, normal RV. She underwent coronary CTA with findings of severe 3V calcification with suspected 2 vessel obstructive disease in RCA and LAD. FFR as negative but Dr. Radford Pax recommended arranging definitive cardiac cath so OV arranged to discuss. CT also incidentally showed: - Mildly enlarged right hilar lymph nodes, largest measuring 12 mm. Recommend follow-up with complete chest CT with contrast in 3 months. -Probable right adrenal mass, although this is incompletely visualized on this exam. Adrenal protocol abdomen CT without and with contrast is recommended for further   Dr. Radford Pax recommended abd CT as recommended but do not see this scheduled. She also recommended f/u with primary care.  The patient is seen back today with her daughter. She continues to note DOE which she states she always attributed to her COPD. Denies chest pain/tightness/pressure. No syncope or bleeding. No edema. BP is running high again. We reviewed her studies in depth. She appears overwhelmed with this information, but states she wants to do what she needs to to remain here for her family.   Labwork independently reviewed: 09/2020 BNP wnl, Cr 1.03, K 5.1 07/2020 LFTs ok, TSH wnl, LDL 87   Past Medical History:  Diagnosis Date   COPD (chronic obstructive pulmonary disease) ([facility])    Diabetes mellitus    Hyperlipidemia    Hypertension    Night sweats    Rectal polyp     Past Surgical History:  Procedure Laterality Date   ABDOMINAL HYSTERECTOMY   2000   fibroids; ovaries intact.   BACK SURGERY     plate put in back   CHOLECYSTECTOMY N/A 05/01/2017   Procedure: LAPAROSCOPIC CHOLECYSTECTOMY WITH INTRAOPERATIVE CHOLANGIOGRAM;  Surgeon: Donnie Mesa, MD;  Location: [facility];  Service: General;  Laterality: N/A;   RECTAL POLYPECTOMY  05/16/2010   TVAdenoma polyp   TUBAL LIGATION      Current Medications: Current Meds  Medication Sig   Accu-Chek Softclix Lancets lancets USE UP TO 4 TIMES DAILY AS  DIRECTED   aspirin 81 MG tablet Take 81 mg by mouth daily.   blood glucose meter kit and supplies KIT Dispense based on patient and insurance preference. Use up to four times daily as directed. (FOR ICD-9 250.00, 250.01).   blood glucose meter kit and supplies Dispense based on insurance preference. Use up to four times daily as directed. (FOR ICD-10 E11.9 E11.65) AccuCheck Meter       Cholecalciferol (VITAMIN D3) 1000 UNITS CAPS Take 2 capsules by mouth every morning.   Fluticasone-Umeclidin-Vilant (TRELEGY ELLIPTA) 200-62.5-25 MCG/INH AEPB Inhale 1 puff into the lungs daily.   glucose blood (CONTOUR NEXT TEST) test strip Use as instructed   insulin glargine (LANTUS SOLOSTAR) 100 UNIT/ML Solostar Pen INJECT SUBCUTANEOUSLY 55  UNITS DAILY AT 10PM   Insulin Pen Needle (BD PEN NEEDLE NANO U/F) 32G X 4 MM MISC Inject 1 Dose into the skin daily.   lisinopril (ZESTRIL) 40 MG tablet TAKE 1 TABLET BY MOUTH  DAILY   metoprolol succinate (  TOPROL-XL) 25 MG 24 hr tablet TAKE 1 TABLET BY MOUTH  DAILY   nystatin (MYCOSTATIN/NYSTOP) powder Apply 1 application topically 3 (three) times daily.   pravastatin (PRAVACHOL) 40 MG tablet TAKE 1 TABLET BY MOUTH IN  THE EVENING AFTER A MEAL   sitaGLIPtin-metformin (JANUMET) 50-1000 MG tablet Take 1 tablet by mouth 2 (two) times daily with a meal.      Allergies:   Patient has no known allergies.   Social History   Socioeconomic History   Marital status: Divorced    Spouse name: Not on file   Number of  children: 3   Years of education: Not on file   Highest education level: Not on file  Occupational History   Occupation: retired    Comment: [employer]   Smoking status: Every Day    Packs/day: 2.50    Years: 60.00    Pack years: 150.00    Types: Cigarettes   Smokeless tobacco: Never   Tobacco comments:    1 ppd  Vaping Use   Vaping Use: Never used  Substance and Sexual Activity   Alcohol use: No   Drug use: No   Sexual activity: Not on file  Other Topics Concern   Not on file  Social History Narrative   Marital status: divorced; not dating in 2019 but interested slightly.        Children:  3 children; 7 seven grandchildren; 1 gg      Lives: with oldest daughter, 2 grandchildren, 83 gg, 1 friend of daughters, daughter's boyfriend.       Employment: retired [employer] age 21.      Tobacco; 1ppd x 50 years.  Not interested in 2019.      Alcohol: none      Exercise: none in 2019. [place]      ADLs:   independent with ADLs; no assistant devices      Advanced Directives:  None; DNR/DNI; no HCPOA.    Social Determinants of Health   Financial Resource Strain: Not on file  Food Insecurity: Not on file  Transportation Needs: Not on file  Physical Activity: Not on file  Stress: Not on file  Social Connections: Not on file     Family History:  The patient's family history includes Cancer (age of onset: 57) in her father; Cancer (age of onset: 49) in her mother; Diabetes in her brother, maternal grandmother, mother, and sister; Heart disease (age of onset: 15) in her mother; Hyperlipidemia in her mother; Hypertension in her brother; Mental illness in her sister.  ROS:   Please see the history of present illness.  All other systems are reviewed and otherwise negative.    EKGs/Labs/Other Studies Reviewed:    Studies reviewed are outlined and summarized above. Reports included below if pertinent.  Cor CT 10/2020 EXAM: Cardiac  CTA   MEDICATIONS: Sub lingual nitro. 34m and lopressor 1028m  TECHNIQUE: The patient was scanned on a [id number] scanner. Gantry rotation speed was 250 msecs. Collimation was .6 mm. A 100 kV prospective scan was triggered in the ascending thoracic aorta at 140 HU's Full mA was used between 35% and 75% of the R-R interval. Average HR during the scan was 67 bpm. The 3D data set was interpreted on a dedicated work station using MPR, MIP and VRT modes. A total of 80cc of contrast was used.   FINDINGS: Non-cardiac: [facility]. No significant findings on limited lung and soft tissue windows.   Calcium Score: Severe 3 vessel calcium   Coronary Arteries: Right dominant with no anomalies   LM: 1-24% calcified plaque note below ostial LAD disease   LAD: 70-99% long area of calcified plaque form ostial LAD through proximal vessel, 25-49% calcified plaque mid vessel 1-24% calcified plaque distally   IM: Small and normal   D1: Normal   D2: Normal   Circumflex: 25-49% calcified plaque proximally and mid 1-24% calcified plaque distally   OM1: Normal   OM2: Normal   RCA: 1-24% calcified plaque proximally 70-99% calcified plaque in mid vessel 1-24% calcified plaque distal vessel   PDA: Normal   PLA: Normal   IMPRESSION: 1. Severe 3 vessel calcium noted score 2085 which is 99 th percentile for age/sex/race   2. Normal aortic root diameter 3.5 cm with severe aortic atherosclerosis   3. CAD RADS 4 likely severe 2 vessel obstructive disease in RCA and LAD Study sent for FFR CT   [place]   Electronically Signed: By: [place] M.D. On: 10/25/2020 09:39  IMPRESSION: Despite high calcium score and morphology on MPR;s FFR CT is negative for obstructive disease in RCA and LAD   [place]     Electronically Signed   By: [place] M.D.   On: 10/25/2020 11:14   Overread FINDINGS: No suspicious nodules,  masses, or infiltrates are identified in the visualized portion of the lungs. No pleural fluid seen.   Mildly enlarged right hilar lymph nodes are seen, largest measuring 12 mm. No enlarged mediastinal lymph nodes identified. Probable right adrenal mass, although this is incompletely visualized on this exam.   IMPRESSION: Mildly enlarged right hilar lymph nodes, largest measuring 12 mm. Recommend follow-up with complete chest CT with contrast in 3 months.   Probable right adrenal mass, although this is incompletely visualized on this exam. Adrenal protocol abdomen CT without and with contrast is recommended for further evaluation.   These results will be called to the ordering clinician or representative by the Radiologist Assistant, and communication documented in the PACS or [employer].     Electronically Signed   By: Marlaine Hind M.D.   On: 10/26/2020 09:02   2D Echo 06/2020  1. Left ventricular ejection fraction, by estimation, is 65 to 70%. The  left ventricle has normal function. The left ventricle has no regional  wall motion abnormalities. There is moderate concentric left ventricular  hypertrophy. Left ventricular  diastolic parameters are consistent with Grade I diastolic dysfunction  (impaired relaxation).   2. Right ventricular systolic function is normal. The right ventricular  size is normal. There is normal pulmonary artery systolic pressure.   3. The mitral valve is grossly normal. Trivial mitral valve  regurgitation. No evidence of mitral stenosis. Moderate mitral annular  calcification.   4. The aortic valve has an indeterminant number of cusps. There is  moderate calcification of the aortic valve. Aortic valve regurgitation is  mild to moderate. Mild to moderate aortic valve sclerosis/calcification is  present, without any evidence of  aortic stenosis.   Comparison(s): No prior Echocardiogram.   Conclusion(s)/Recommendation(s): Otherwise normal  echocardiogram, with  minor abnormalities described in the report.     EKG:  EKG is ordered today, personally reviewed, demonstrating NSR 91bpm, no acute STT changes  Recent Labs: 08/01/2020: ALT 13; Hemoglobin 14.9; Platelets 298; TSH 1.640 10/11/2020: BUN 12; Creatinine, Ser 1.03; NT-Pro BNP 74; Potassium 5.1; Sodium 140  Recent Lipid Panel    Component Value Date/Time   CHOL 162 08/01/2020 0908   TRIG 140 08/01/2020 0908   HDL 50 08/01/2020 0908   CHOLHDL 3.2 08/01/2020 0908   CHOLHDL 3.0 02/07/2015 1314   VLDL 31 (H) 02/07/2015 1314   LDLCALC 87 08/01/2020 0908   LDLDIRECT 85 04/17/2012 1349    PHYSICAL EXAM:    VS:  BP (!) 180/92   Pulse 91   Ht 5' 7" (1.702 m)   Wt 168 lb 6.4 oz (76.4 kg)   SpO2 99%   BMI 26.38 kg/m   BMI: Body mass index is 26.38 kg/m.  GEN: Well nourished, well developed female in no acute distress HEENT: normocephalic, atraumatic Neck: no JVD or masses. + R carotid bruit Cardiac: RRR; no murmurs, rubs, or gallops, no edema  Respiratory:  clear to auscultation bilaterally, normal work of breathing GI: soft, nontender, nondistended, + BS MS: no deformity or atrophy Skin: warm and dry, no rash Neuro:  Alert and Oriented x 3, Strength and sensation are intact, follows commands Psych: euthymic mood, full affect  Wt Readings from Last 3 Encounters:  11/16/20 168 lb 6.4 oz (76.4 kg)  10/11/20 170 lb 6.4 oz (77.3 kg)  08/31/20 168 lb 9.6 oz (76.5 kg)     ASSESSMENT & PLAN:   1. Dyspnea on exertion with coronary artery disease - Dr. Radford Pax has recommended to proceed with cardiac cath given extent of CAD on CT scan. Procedure discussed with patient today in depth with daughter present. All questions answered. She is agreeable to proceed. Check pre-cath CBC, CMET today. Needs better BP control. See below regarding BP/lipids. Continue ASA, BB, statin for now.  Shared Decision Making/Informed Consent The risks [stroke (1 in 1000), death (1 in 1000),  kidney failure [usually temporary] (1 in 500), bleeding (1 in 200), allergic reaction [possibly serious] (1 in 200)], benefits (diagnostic support and management of coronary artery disease) and alternatives of a cardiac catheterization were discussed in detail with Ms. Plummer and she is willing to proceed.  2. Essential HTN - BP remains elevated. Recheck by me still high but coming down - 158/80. Check TSH with labs above. Dr. Theodosia Blender note indicated patient reported hx of white coat HTN. Patient has not been checking BP at home recently, so would be concerned for persistent HTN. Lisinopril is already at max dose. Would not titrate Toprol at this time due to COPD (also not a great BP medication). Given need for upcoming cath and CT scans, not a great time to add diuretic. Will add amlodipine 38m daily. The patient was provided instructions on monitoring blood pressure at home for 1 week and relaying results to our office. May ultimately need titration to 141mdaily.  3. Hyperlipidemia goal LDL <70 - last LDL not at goal in 07/2020. I considered switching pravastatin to atorvastatin this visit but the patient seems overwhelmed with all of the new recommendations therefore we will take stepwise approach. If she tolerates amlodipine addition of above, consider changing statin at time of cath. Will obtain f/u CMET today for baseline LFTs.  4. Incidental findings on CT: hilar lymph nodes and adrenal mass - discussed findings with patient and advised she f/u with PCP to discuss further - will need consideration of chest CT 3 months from original study. Patient and daughter verbalized understanding. We will go ahead and arrange the adrenal protocol abdominal CT as recommended.  5. Right carotid bruit - obtain carotid duplex.  Disposition: F/u with Dr. Radford Pax, myself or APP in 2-3 weeks.   Medication Adjustments/Labs and Tests Ordered: Current medicines are reviewed at length with the patient today.  Concerns  regarding medicines are outlined above. Medication changes, Labs and Tests ordered today are summarized above and listed in the Patient Instructions accessible in Encounters.   Signed, Charlie Pitter, PA-C  11/16/2020 9:02 AM    [facility] [place], [place], [place]  [address] Phone: [phone]; Fax: [phone]

## 2020-11-14 NOTE — H&P (View-Only) (Signed)
Cardiology Office Note    Date:  11/16/2020   ID:  Colleen, Douglas 03/28/1945, MRN [id number]  PCP:  Colleen Herter, NP  Cardiologist:  Fransico Him, MD  Electrophysiologist:  None   Chief Complaint: discuss cath  History of Present Illness:   [name] is a 75 y.o. female with history of HTN, HLD, DM, COPD who was recently seen in clinic for evaluation of exertional dyspnea. No chest pain. Prior 2D Echo 06/2020 EF 65-70%, mod LVH, grade 1 DD, normal RV. She underwent coronary CTA with findings of severe 3V calcification with suspected 2 vessel obstructive disease in RCA and LAD. FFR as negative but Dr. Radford Pax recommended arranging definitive cardiac cath so OV arranged to discuss. CT also incidentally showed: - Mildly enlarged right hilar lymph nodes, largest measuring 12 mm. Recommend follow-up with complete chest CT with contrast in 3 months. -Probable right adrenal mass, although this is incompletely visualized on this exam. Adrenal protocol abdomen CT without and with contrast is recommended for further   Dr. Radford Pax recommended abd CT as recommended but do not see this scheduled. She also recommended f/u with primary care.  The patient is seen back today with her daughter. She continues to note DOE which she states she always attributed to her COPD. Denies chest pain/tightness/pressure. No syncope or bleeding. No edema. BP is running high again. We reviewed her studies in depth. She appears overwhelmed with this information, but states she wants to do what she needs to to remain here for her family.   Labwork independently reviewed: 09/2020 BNP wnl, Cr 1.03, K 5.1 07/2020 LFTs ok, TSH wnl, LDL 87   Past Medical History:  Diagnosis Date   COPD (chronic obstructive pulmonary disease) (HCC)    Diabetes mellitus    Hyperlipidemia    Hypertension    Night sweats    Rectal polyp     Past Surgical History:  Procedure Laterality Date   ABDOMINAL HYSTERECTOMY   2000   fibroids; ovaries intact.   BACK SURGERY     plate put in back   CHOLECYSTECTOMY N/A 05/01/2017   Procedure: LAPAROSCOPIC CHOLECYSTECTOMY WITH INTRAOPERATIVE CHOLANGIOGRAM;  Surgeon: Donnie Mesa, MD;  Location: [place];  Service: General;  Laterality: N/A;   RECTAL POLYPECTOMY  05/16/2010   TVAdenoma polyp   TUBAL LIGATION      Current Medications: Current Meds  Medication Sig   Accu-Chek Softclix Lancets lancets USE UP TO 4 TIMES DAILY AS  DIRECTED   aspirin 81 MG tablet Take 81 mg by mouth daily.   blood glucose meter kit and supplies KIT Dispense based on patient and insurance preference. Use up to four times daily as directed. (FOR ICD-9 250.00, 250.01).   blood glucose meter kit and supplies Dispense based on insurance preference. Use up to four times daily as directed. (FOR ICD-10 E11.9 E11.65) AccuCheck Meter       Cholecalciferol (VITAMIN D3) 1000 UNITS CAPS Take 2 capsules by mouth every morning.   Fluticasone-Umeclidin-Vilant (TRELEGY ELLIPTA) 200-62.5-25 MCG/INH AEPB Inhale 1 puff into the lungs daily.   glucose blood (CONTOUR NEXT TEST) test strip Use as instructed   insulin glargine (LANTUS SOLOSTAR) 100 UNIT/ML Solostar Pen INJECT SUBCUTANEOUSLY 55  UNITS DAILY AT 10PM   Insulin Pen Needle (BD PEN NEEDLE NANO U/F) 32G X 4 MM MISC Inject 1 Dose into the skin daily.   lisinopril (ZESTRIL) 40 MG tablet TAKE 1 TABLET BY MOUTH  DAILY   metoprolol succinate (  TOPROL-XL) 25 MG 24 hr tablet TAKE 1 TABLET BY MOUTH  DAILY   nystatin (MYCOSTATIN/NYSTOP) powder Apply 1 application topically 3 (three) times daily.   pravastatin (PRAVACHOL) 40 MG tablet TAKE 1 TABLET BY MOUTH IN  THE EVENING AFTER A MEAL   sitaGLIPtin-metformin (JANUMET) 50-1000 MG tablet Take 1 tablet by mouth 2 (two) times daily with a meal.      Allergies:   Patient has no known allergies.   Social History   Socioeconomic History   Marital status: Divorced    Spouse name: Not on file   Number of  children: 3   Years of education: Not on file   Highest education level: Not on file  Occupational History   Occupation: retired    Comment: [employer]   Smoking status: Every Day    Packs/day: 2.50    Years: 60.00    Pack years: 150.00    Types: Cigarettes   Smokeless tobacco: Never   Tobacco comments:    1 ppd  Vaping Use   Vaping Use: Never used  Substance and Sexual Activity   Alcohol use: No   Drug use: No   Sexual activity: Not on file  Other Topics Concern   Not on file  Social History Narrative   Marital status: divorced; not dating in 2019 but interested slightly.        Children:  3 children; 7 seven grandchildren; 1 gg      Lives: with oldest daughter, 2 grandchildren, 45 gg, 1 friend of daughters, daughter's boyfriend.       Employment: retired [employer] age 12.      Tobacco; 1ppd x 50 years.  Not interested in 2019.      Alcohol: none      Exercise: none in 2019. [place]      ADLs:   independent with ADLs; no assistant devices      Advanced Directives:  None; DNR/DNI; no HCPOA.    Social Determinants of Health   Financial Resource Strain: Not on file  Food Insecurity: Not on file  Transportation Needs: Not on file  Physical Activity: Not on file  Stress: Not on file  Social Connections: Not on file     Family History:  The patient's family history includes Cancer (age of onset: 62) in her father; Cancer (age of onset: 110) in her mother; Diabetes in her brother, maternal grandmother, mother, and sister; Heart disease (age of onset: 61) in her mother; Hyperlipidemia in her mother; Hypertension in her brother; Mental illness in her sister.  ROS:   Please see the history of present illness.  All other systems are reviewed and otherwise negative.    EKGs/Labs/Other Studies Reviewed:    Studies reviewed are outlined and summarized above. Reports included below if pertinent.  Cor CT 10/2020 EXAM: Cardiac  CTA   MEDICATIONS: Sub lingual nitro. 22m and lopressor 1056m  TECHNIQUE: The patient was scanned on a [id number] scanner. Gantry rotation speed was 250 msecs. Collimation was .6 mm. A 100 kV prospective scan was triggered in the ascending thoracic aorta at 140 HU's Full mA was used between 35% and 75% of the R-R interval. Average HR during the scan was 67 bpm. The 3D data set was interpreted on a dedicated work station using MPR, MIP and VRT modes. A total of 80cc of contrast was used.   FINDINGS: Non-cardiac: [facility] No significant findings on limited lung and soft tissue windows.   Calcium Score: Severe 3 vessel calcium   Coronary Arteries: Right dominant with no anomalies   LM: 1-24% calcified plaque note below ostial LAD disease   LAD: 70-99% long area of calcified plaque form ostial LAD through proximal vessel, 25-49% calcified plaque mid vessel 1-24% calcified plaque distally   IM: Small and normal   D1: Normal   D2: Normal   Circumflex: 25-49% calcified plaque proximally and mid 1-24% calcified plaque distally   OM1: Normal   OM2: Normal   RCA: 1-24% calcified plaque proximally 70-99% calcified plaque in mid vessel 1-24% calcified plaque distal vessel   PDA: Normal   PLA: Normal   IMPRESSION: 1. Severe 3 vessel calcium noted score 2085 which is 99 th percentile for age/sex/race   2. Normal aortic root diameter 3.5 cm with severe aortic atherosclerosis   3. CAD RADS 4 likely severe 2 vessel obstructive disease in RCA and LAD Study sent for FFR CT   Peter Nishan   Electronically Signed: By: Peter  Nishan M.D. On: 10/25/2020 09:39  IMPRESSION: Despite high calcium score and morphology on MPR;s FFR CT is negative for obstructive disease in RCA and LAD   Peter Nishan     Electronically Signed   By: Peter  Nishan M.D.   On: 10/25/2020 11:14   Overread FINDINGS: No suspicious nodules,  masses, or infiltrates are identified in the visualized portion of the lungs. No pleural fluid seen.   Mildly enlarged right hilar lymph nodes are seen, largest measuring 12 mm. No enlarged mediastinal lymph nodes identified. Probable right adrenal mass, although this is incompletely visualized on this exam.   IMPRESSION: Mildly enlarged right hilar lymph nodes, largest measuring 12 mm. Recommend follow-up with complete chest CT with contrast in 3 months.   Probable right adrenal mass, although this is incompletely visualized on this exam. Adrenal protocol abdomen CT without and with contrast is recommended for further evaluation.   These results will be called to the ordering clinician or representative by the Radiologist Assistant, and communication documented in the PACS or [facility] Dashboard.     Electronically Signed   By: John A Stahl M.D.   On: 10/26/2020 09:02   2D Echo 06/2020  1. Left ventricular ejection fraction, by estimation, is 65 to 70%. The  left ventricle has normal function. The left ventricle has no regional  wall motion abnormalities. There is moderate concentric left ventricular  hypertrophy. Left ventricular  diastolic parameters are consistent with Grade I diastolic dysfunction  (impaired relaxation).   2. Right ventricular systolic function is normal. The right ventricular  size is normal. There is normal pulmonary artery systolic pressure.   3. The mitral valve is grossly normal. Trivial mitral valve  regurgitation. No evidence of mitral stenosis. Moderate mitral annular  calcification.   4. The aortic valve has an indeterminant number of cusps. There is  moderate calcification of the aortic valve. Aortic valve regurgitation is  mild to moderate. Mild to moderate aortic valve sclerosis/calcification is  present, without any evidence of  aortic stenosis.   Comparison(s): No prior Echocardiogram.   Conclusion(s)/Recommendation(s): Otherwise normal  echocardiogram, with  minor abnormalities described in the report.     EKG:  EKG is ordered today, personally reviewed, demonstrating NSR 91bpm, no acute STT changes  Recent Labs: 08/01/2020: ALT 13; Hemoglobin 14.9; Platelets 298; TSH 1.640 10/11/2020: BUN 12; Creatinine, Ser 1.03; NT-Pro BNP 74; Potassium 5.1; Sodium 140    Recent Lipid Panel    Component Value Date/Time   CHOL 162 08/01/2020 0908   TRIG 140 08/01/2020 0908   HDL 50 08/01/2020 0908   CHOLHDL 3.2 08/01/2020 0908   CHOLHDL 3.0 02/07/2015 1314   VLDL 31 (H) 02/07/2015 1314   LDLCALC 87 08/01/2020 0908   LDLDIRECT 85 04/17/2012 1349    PHYSICAL EXAM:    VS:  BP (!) 180/92   Pulse 91   Ht 5' 7" (1.702 m)   Wt 168 lb 6.4 oz (76.4 kg)   SpO2 99%   BMI 26.38 kg/m   BMI: Body mass index is 26.38 kg/m.  GEN: Well nourished, well developed female in no acute distress HEENT: normocephalic, atraumatic Neck: no JVD or masses. + R carotid bruit Cardiac: RRR; no murmurs, rubs, or gallops, no edema  Respiratory:  clear to auscultation bilaterally, normal work of breathing GI: soft, nontender, nondistended, + BS MS: no deformity or atrophy Skin: warm and dry, no rash Neuro:  Alert and Oriented x 3, Strength and sensation are intact, follows commands Psych: euthymic mood, full affect  Wt Readings from Last 3 Encounters:  11/16/20 168 lb 6.4 oz (76.4 kg)  10/11/20 170 lb 6.4 oz (77.3 kg)  08/31/20 168 lb 9.6 oz (76.5 kg)     ASSESSMENT & PLAN:   1. Dyspnea on exertion with coronary artery disease - Dr. Radford Pax has recommended to proceed with cardiac cath given extent of CAD on CT scan. Procedure discussed with patient today in depth with daughter present. All questions answered. She is agreeable to proceed. Check pre-cath CBC, CMET today. Needs better BP control. See below regarding BP/lipids. Continue ASA, BB, statin for now.  Shared Decision Making/Informed Consent The risks [stroke (1 in 1000), death (1 in 1000),  kidney failure [usually temporary] (1 in 500), bleeding (1 in 200), allergic reaction [possibly serious] (1 in 200)], benefits (diagnostic support and management of coronary artery disease) and alternatives of a cardiac catheterization were discussed in detail with Ms. Plummer and she is willing to proceed.  2. Essential HTN - BP remains elevated. Recheck by me still high but coming down - 158/80. Check TSH with labs above. Dr. Theodosia Blender note indicated patient reported hx of white coat HTN. Patient has not been checking BP at home recently, so would be concerned for persistent HTN. Lisinopril is already at max dose. Would not titrate Toprol at this time due to COPD (also not a great BP medication). Given need for upcoming cath and CT scans, not a great time to add diuretic. Will add amlodipine 38m daily. The patient was provided instructions on monitoring blood pressure at home for 1 week and relaying results to our office. May ultimately need titration to 141mdaily.  3. Hyperlipidemia goal LDL <70 - last LDL not at goal in 07/2020. I considered switching pravastatin to atorvastatin this visit but the patient seems overwhelmed with all of the new recommendations therefore we will take stepwise approach. If she tolerates amlodipine addition of above, consider changing statin at time of cath. Will obtain f/u CMET today for baseline LFTs.  4. Incidental findings on CT: hilar lymph nodes and adrenal mass - discussed findings with patient and advised she f/u with PCP to discuss further - will need consideration of chest CT 3 months from original study. Patient and daughter verbalized understanding. We will go ahead and arrange the adrenal protocol abdominal CT as recommended.  5. Right carotid bruit - obtain carotid duplex.  Disposition: F/u with Dr. Turner, myself or APP in 2-3 weeks.   Medication Adjustments/Labs and Tests Ordered: Current medicines are reviewed at length with the patient today.  Concerns  regarding medicines are outlined above. Medication changes, Labs and Tests ordered today are summarized above and listed in the Patient Instructions accessible in Encounters.   Signed, Theordore Cisnero N Treyshawn Muldrew, PA-C  11/16/2020 9:02 AM    [facility] [address], [place], [place]  [address] Phone: [phone]; Fax: [phone]   

## 2020-11-16 ENCOUNTER — Ambulatory Visit: Payer: Medicare Other | Admitting: Physician Assistant

## 2020-11-16 ENCOUNTER — Encounter: Payer: Self-pay | Admitting: Physician Assistant

## 2020-11-16 ENCOUNTER — Other Ambulatory Visit: Payer: Self-pay

## 2020-11-16 VITALS — BP 180/92 | HR 91 | Ht 67.0 in | Wt 168.4 lb

## 2020-11-16 DIAGNOSIS — I10 Essential (primary) hypertension: Secondary | ICD-10-CM | POA: Diagnosis not present

## 2020-11-16 DIAGNOSIS — R0609 Other forms of dyspnea: Secondary | ICD-10-CM | POA: Diagnosis not present

## 2020-11-16 DIAGNOSIS — E785 Hyperlipidemia, unspecified: Secondary | ICD-10-CM

## 2020-11-16 DIAGNOSIS — I2510 Atherosclerotic heart disease of native coronary artery without angina pectoris: Secondary | ICD-10-CM | POA: Diagnosis not present

## 2020-11-16 DIAGNOSIS — E278 Other specified disorders of adrenal gland: Secondary | ICD-10-CM

## 2020-11-16 DIAGNOSIS — R590 Localized enlarged lymph nodes: Secondary | ICD-10-CM

## 2020-11-16 DIAGNOSIS — R0989 Other specified symptoms and signs involving the circulatory and respiratory systems: Secondary | ICD-10-CM

## 2020-11-16 LAB — CBC
Hematocrit: 41.3 % (ref 34.0–46.6)
Hemoglobin: 13.3 g/dL (ref 11.1–15.9)
MCH: 26.2 pg — ABNORMAL LOW (ref 26.6–33.0)
MCHC: 32.2 g/dL (ref 31.5–35.7)
MCV: 81 fL (ref 79–97)
Platelets: 364 10*3/uL (ref 150–450)
RBC: 5.08 x10E6/uL (ref 3.77–5.28)
RDW: 14.1 % (ref 11.7–15.4)
WBC: 7.3 10*3/uL (ref 3.4–10.8)

## 2020-11-16 LAB — COMPREHENSIVE METABOLIC PANEL
ALT: 12 IU/L (ref 0–32)
AST: 14 IU/L (ref 0–40)
Albumin/Globulin Ratio: 1.7 (ref 1.2–2.2)
Albumin: 4.1 g/dL (ref 3.7–4.7)
Alkaline Phosphatase: 132 IU/L — ABNORMAL HIGH (ref 44–121)
BUN/Creatinine Ratio: 18 (ref 12–28)
BUN: 17 mg/dL (ref 8–27)
Bilirubin Total: <0.2 mg/dL (ref 0.0–1.2)
CO2: 25 mmol/L (ref 20–29)
Calcium: 10.1 mg/dL (ref 8.7–10.3)
Chloride: 102 mmol/L (ref 96–106)
Creatinine, Ser: 0.92 mg/dL (ref 0.57–1.00)
Globulin, Total: 2.4 g/dL (ref 1.5–4.5)
Glucose: 177 mg/dL — ABNORMAL HIGH (ref 70–99)
Potassium: 5.1 mmol/L (ref 3.5–5.2)
Sodium: 140 mmol/L (ref 134–144)
Total Protein: 6.5 g/dL (ref 6.0–8.5)
eGFR: 65 mL/min/{1.73_m2} (ref >59)

## 2020-11-16 LAB — TSH: TSH: 1.22 u[IU]/mL (ref 0.450–4.500)

## 2020-11-16 MED ORDER — SODIUM CHLORIDE 0.9% FLUSH
3.0000 mL | Freq: Two times a day (BID) | INTRAVENOUS | Status: AC
Start: 2020-11-16 — End: ?

## 2020-11-16 MED ORDER — AMLODIPINE BESYLATE 5 MG PO TABS: 5.0000 mg | ORAL_TABLET | Freq: Every day | ORAL | 3 refills | Status: DC

## 2020-11-16 NOTE — Patient Instructions (Addendum)
Medication Instructions:  Your physician has recommended you make the following change in your medication:   START Amlodipine 5 mg taking 1 daily    *If you need a refill on your cardiac medications before your next appointment, please call your pharmacy*   Lab Work: TODAY:  CMET, TSH, & CBC  If you have labs (blood work) drawn today and your tests are completely normal, you will receive your results only by: [place] (if you have MyChart) OR A paper copy in the mail If you have any lab test that is abnormal or we need to change your treatment, we will call you to review the results.   Testing/Procedures: Your physician recommends you have a CT Abdomen.  Non-Cardiac CT scanning, (CAT scanning), is a noninvasive, special x-ray that produces cross-sectional images of the body using x-rays and a computer. CT scans help physicians diagnose and treat medical conditions. For some CT exams, a contrast material is used to enhance visibility in the area of the body being studied. CT scans provide greater clarity and reveal more details than regular x-ray exams.  Your physician has requested that you have a carotid duplex. This test is an ultrasound of the carotid arteries in your neck. It looks at blood flow through these arteries that supply the brain with blood. Allow one hour for this exam. There are no restrictions or special instructions.  Your physician has requested that you have a cardiac catheterization. Cardiac catheterization is used to diagnose and/or treat various heart conditions. Doctors may recommend this procedure for a number of different reasons. The most common reason is to evaluate chest pain. Chest pain can be a symptom of coronary artery disease (CAD), and cardiac catheterization can show whether plaque is narrowing or blocking your heart's arteries. This procedure is also used to evaluate the valves, as well as measure the blood flow and oxygen levels in different parts of  your heart. For further information please visit [email or web address]. Please follow instruction sheet, BELOW:   [facility] OFFICE [place], [place] [address] Dept: [phone] Loc: [place]  11/16/2020  You are scheduled for a Cardiac Catheterization on Friday, November 4 with Dr. Shelva Majestic.  1. Please arrive at the [facility] (Main Entrance A) at [facility]: [address] [place], [place] [address] at 8:00 AM (This time is two hours before your procedure to ensure your preparation). Free valet parking service is available.   Special note: Every effort is made to have your procedure done on time. Please understand that emergencies sometimes delay scheduled procedures.  2. Diet: Do not eat solid foods after midnight.  The patient may have clear liquids until 5am upon the day of the procedure.  3. Labs: You will need to have blood drawn on TODAY.  4. Medication instructions in preparation for your procedure:   Contrast Allergy: No   Take only 27 units of insulin the night before your procedure. Do not take any insulin on the day of the procedure.  Do not take Diabetes Med Janumet (Metformin + Sitagliptin) on the day of the procedure and HOLD 48 HOURS AFTER THE PROCEDURE.  On the morning of your procedure, take your Aspirin and any morning medicines NOT listed above.  You may use sips of water.  5. Plan for one night stay--bring personal belongings. 6. Bring a current list of your medications and current insurance  cards. 7. You MUST have a responsible person to drive you home. 8. Someone MUST be with you the first 24 hours after you arrive home or your discharge will be delayed. 9. Please wear clothes that are easy to get on and off and wear slip-on shoes.  Thank you for allowing [place] to care for you!   --  Invasive Cardiovascular  services   Follow-Up: At [facility], you and your health needs are our priority.  As part of our continuing mission to provide you with exceptional heart care, we have created designated Provider Care Teams.  These Care Teams include your primary Cardiologist (physician) and Advanced Practice Providers (APPs -  Physician Assistants and Nurse Practitioners) who all work together to provide you with the care you need, when you need it.  We recommend signing up for the patient portal called "MyChart".  Sign up information is provided on this After Visit Summary.  MyChart is used to connect with patients for Virtual Visits (Telemedicine).  Patients are able to view lab/test results, encounter notes, upcoming appointments, etc.  Non-urgent messages can be sent to your provider as well.   To learn more about what you can do with MyChart, go to [email or web address].    Your next appointment:   12/01/2020 ARRIVE AT 3:25     The format for your next appointment:   In Person  Provider:   You may see Fransico Him, MD or one of the following Advanced Practice Providers on your designated Care Team:     Other Instructions  We need to get a better idea of what your blood pressure is running at home. Here are some instructions to follow: - I would recommend using a blood pressure cuff that goes on your arm. The wrist ones can be inaccurate. If you're purchasing one for the first time, try to select one that also reports your heart rate because this can be helpful information as well. - To check your blood pressure, choose a time at least 3 hours after taking your blood pressure medicines. If you can sample it at different times of the day, that's great - it might give you more information about how your blood pressure fluctuates. Remain seated in a chair for 5 minutes quietly beforehand, then check it.  - Please record a list of those readings and call us/send in MyChart message with them for our  review in 1 week.

## 2020-11-17 ENCOUNTER — Telehealth: Payer: Self-pay | Admitting: *Deleted

## 2020-11-17 NOTE — Telephone Encounter (Signed)
Cardiac catheterization scheduled at [facility] for: Friday November 18, 2020 [facility] Main Entrance A [facility]) at: 8 AM   No solid food after midnight prior to cath, clear liquids until 5 AM day of procedure.  Medication instructions: Hold: Janumet-day of procedure and 48 hours post procedure Insulin-1/2 usual Insulin dose HS prior to procedure  Except hold medications usual morning medications can be taken pre-cath with sips of water including aspirin 81 mg.    Confirmed patient has responsible adult to drive home post procedure and be with patient first 24 hours after arriving home.  [facility] does allow one visitor to accompany you and wait in the hospital waiting room while you are there for your procedure. You and your visitor will be asked to wear a mask once you enter the hospital.   Patient reports does not currently have any new symptoms concerning for COVID-19 and no household members with COVID-19 like illness.       Reviewed procedure/mask/visitor instructions with patient.

## 2020-11-18 ENCOUNTER — Ambulatory Visit (HOSPITAL_COMMUNITY)
Admission: RE | Admit: 2020-11-18 | Discharge: 2020-11-18 | Disposition: A | Discharge status: Home / Self Care | Payer: Medicare Other | Source: Ambulatory Visit | Attending: Cardiovascular Disease | Admitting: Cardiovascular Disease

## 2020-11-18 ENCOUNTER — Other Ambulatory Visit: Payer: Self-pay

## 2020-11-18 ENCOUNTER — Encounter (HOSPITAL_COMMUNITY): Payer: Self-pay | Admitting: Cardiovascular Disease

## 2020-11-18 ENCOUNTER — Ambulatory Visit (HOSPITAL_COMMUNITY)
Admission: RE | Disposition: A | Discharge status: Home / Self Care | Payer: Self-pay | Source: Ambulatory Visit | Attending: Cardiovascular Disease

## 2020-11-18 DIAGNOSIS — J449 Chronic obstructive pulmonary disease, unspecified: Secondary | ICD-10-CM | POA: Diagnosis not present

## 2020-11-18 DIAGNOSIS — R0989 Other specified symptoms and signs involving the circulatory and respiratory systems: Secondary | ICD-10-CM | POA: Insufficient documentation

## 2020-11-18 DIAGNOSIS — I119 Hypertensive heart disease without heart failure: Secondary | ICD-10-CM | POA: Diagnosis not present

## 2020-11-18 DIAGNOSIS — R0609 Other forms of dyspnea: Secondary | ICD-10-CM

## 2020-11-18 DIAGNOSIS — E279 Disorder of adrenal gland, unspecified: Secondary | ICD-10-CM | POA: Insufficient documentation

## 2020-11-18 DIAGNOSIS — F17210 Nicotine dependence, cigarettes, uncomplicated: Secondary | ICD-10-CM | POA: Insufficient documentation

## 2020-11-18 DIAGNOSIS — E119 Type 2 diabetes mellitus without complications: Secondary | ICD-10-CM | POA: Insufficient documentation

## 2020-11-18 DIAGNOSIS — R931 Abnormal findings on diagnostic imaging of heart and coronary circulation: Secondary | ICD-10-CM | POA: Diagnosis not present

## 2020-11-18 DIAGNOSIS — Z7982 Long term (current) use of aspirin: Secondary | ICD-10-CM | POA: Diagnosis not present

## 2020-11-18 DIAGNOSIS — Z7984 Long term (current) use of oral hypoglycemic drugs: Secondary | ICD-10-CM | POA: Insufficient documentation

## 2020-11-18 DIAGNOSIS — Z833 Family history of diabetes mellitus: Secondary | ICD-10-CM | POA: Diagnosis not present

## 2020-11-18 DIAGNOSIS — Z794 Long term (current) use of insulin: Secondary | ICD-10-CM | POA: Insufficient documentation

## 2020-11-18 DIAGNOSIS — E785 Hyperlipidemia, unspecified: Secondary | ICD-10-CM | POA: Diagnosis not present

## 2020-11-18 DIAGNOSIS — I2510 Atherosclerotic heart disease of native coronary artery without angina pectoris: Secondary | ICD-10-CM

## 2020-11-18 DIAGNOSIS — Z8249 Family history of ischemic heart disease and other diseases of the circulatory system: Secondary | ICD-10-CM | POA: Diagnosis not present

## 2020-11-18 HISTORY — PX: LEFT HEART CATH AND CORONARY ANGIOGRAPHY: CATH118249

## 2020-11-18 HISTORY — DX: Atherosclerotic heart disease of native coronary artery without angina pectoris: I25.10

## 2020-11-18 LAB — GLUCOSE, CAPILLARY: Glucose-Capillary: 201 mg/dL — ABNORMAL HIGH (ref 70–99)

## 2020-11-18 SURGERY — LEFT HEART CATH AND CORONARY ANGIOGRAPHY
Anesthesia: LOCAL

## 2020-11-18 MED ORDER — LABETALOL HCL 5 MG/ML IV SOLN: 10.0000 mg | INTRAVENOUS | Status: DC | PRN

## 2020-11-18 MED ORDER — SODIUM CHLORIDE 0.9 % IV SOLN: 250.0000 mL | INTRAVENOUS | Status: DC | PRN

## 2020-11-18 MED ORDER — ONDANSETRON HCL 4 MG/2ML IJ SOLN: 4.0000 mg | Freq: Four times a day (QID) | INTRAMUSCULAR | Status: DC | PRN

## 2020-11-18 MED ORDER — SODIUM CHLORIDE 0.9% FLUSH: 3.0000 mL | INTRAVENOUS | Status: DC | PRN

## 2020-11-18 MED ORDER — HEPARIN (PORCINE) IN NACL 1000-0.9 UT/500ML-% IV SOLN
INTRAVENOUS | Status: AC
  Filled 2020-11-18: qty 1000

## 2020-11-18 MED ORDER — MIDAZOLAM HCL 2 MG/2ML IJ SOLN
INTRAMUSCULAR | Status: AC
  Filled 2020-11-18: qty 2

## 2020-11-18 MED ORDER — VERAPAMIL HCL 2.5 MG/ML IV SOLN
INTRAVENOUS | Status: AC
  Filled 2020-11-18: qty 2

## 2020-11-18 MED ORDER — HYDRALAZINE HCL 20 MG/ML IJ SOLN: 10.0000 mg | INTRAMUSCULAR | Status: DC | PRN

## 2020-11-18 MED ORDER — IOHEXOL 350 MG/ML SOLN
INTRAVENOUS | Status: DC | PRN
  Administered 2020-11-18: 55 mL via INTRA_ARTERIAL

## 2020-11-18 MED ORDER — SODIUM CHLORIDE 0.9 % WEIGHT BASED INFUSION: 1.0000 mL/kg/h | INTRAVENOUS | Status: DC

## 2020-11-18 MED ORDER — HEPARIN SODIUM (PORCINE) 1000 UNIT/ML IJ SOLN
INTRAMUSCULAR | Status: AC
  Filled 2020-11-18: qty 1

## 2020-11-18 MED ORDER — HEPARIN (PORCINE) IN NACL 1000-0.9 UT/500ML-% IV SOLN
INTRAVENOUS | Status: AC
  Filled 2020-11-18: qty 500

## 2020-11-18 MED ORDER — SODIUM CHLORIDE 0.9 % WEIGHT BASED INFUSION
3.0000 mL/kg/h | INTRAVENOUS | Status: AC
  Administered 2020-11-18: 3 mL/kg/h via INTRAVENOUS

## 2020-11-18 MED ORDER — ASPIRIN 81 MG PO CHEW: 81.0000 mg | CHEWABLE_TABLET | ORAL | Status: DC

## 2020-11-18 MED ORDER — SODIUM CHLORIDE 0.9% FLUSH: 3.0000 mL | Freq: Two times a day (BID) | INTRAVENOUS | Status: DC

## 2020-11-18 MED ORDER — HEPARIN (PORCINE) IN NACL 1000-0.9 UT/500ML-% IV SOLN
INTRAVENOUS | Status: DC | PRN
  Administered 2020-11-18 (×2): 500 mL

## 2020-11-18 MED ORDER — LIDOCAINE HCL (PF) 1 % IJ SOLN
INTRAMUSCULAR | Status: AC
  Filled 2020-11-18: qty 30

## 2020-11-18 MED ORDER — SODIUM CHLORIDE 0.9 % IV SOLN: INTRAVENOUS | Status: DC

## 2020-11-18 MED ORDER — ASPIRIN 81 MG PO CHEW: 81.0000 mg | CHEWABLE_TABLET | Freq: Every day | ORAL | Status: DC

## 2020-11-18 MED ORDER — FENTANYL CITRATE (PF) 100 MCG/2ML IJ SOLN
INTRAMUSCULAR | Status: DC | PRN
  Administered 2020-11-18 (×2): 25 ug via INTRAVENOUS

## 2020-11-18 MED ORDER — LIDOCAINE HCL (PF) 1 % IJ SOLN
INTRAMUSCULAR | Status: DC | PRN
  Administered 2020-11-18: 2 mL

## 2020-11-18 MED ORDER — ACETAMINOPHEN 325 MG PO TABS: 650.0000 mg | ORAL_TABLET | ORAL | Status: DC | PRN

## 2020-11-18 MED ORDER — FENTANYL CITRATE (PF) 100 MCG/2ML IJ SOLN
INTRAMUSCULAR | Status: AC
  Filled 2020-11-18: qty 2

## 2020-11-18 MED ORDER — MIDAZOLAM HCL 2 MG/2ML IJ SOLN
INTRAMUSCULAR | Status: DC | PRN
  Administered 2020-11-18 (×2): 1 mg via INTRAVENOUS

## 2020-11-18 MED ORDER — HEPARIN SODIUM (PORCINE) 1000 UNIT/ML IJ SOLN
INTRAMUSCULAR | Status: DC | PRN
  Administered 2020-11-18: 3600 [IU] via INTRAVENOUS

## 2020-11-18 MED ORDER — DIAZEPAM 5 MG PO TABS: 5.0000 mg | ORAL_TABLET | Freq: Four times a day (QID) | ORAL | Status: DC | PRN

## 2020-11-18 MED ORDER — VERAPAMIL HCL 2.5 MG/ML IV SOLN
INTRAVENOUS | Status: DC | PRN
  Administered 2020-11-18: 10 mL via INTRA_ARTERIAL

## 2020-11-18 SURGICAL SUPPLY — 11 items
CATH INFINITI JR4 5F (CATHETERS) ×1 IMPLANT
CATH OPTITORQUE TIG 4.0 5F (CATHETERS) ×1 IMPLANT
DEVICE RAD TR BAND REGULAR (VASCULAR PRODUCTS) ×1 IMPLANT
GLIDESHEATH SLEND SS 6F .021 (SHEATH) ×1 IMPLANT
GUIDEWIRE INQWIRE 1.5J.035X260 (WIRE) IMPLANT
INQWIRE 1.5J .035X260CM (WIRE) ×2
KIT HEART LEFT (KITS) ×2 IMPLANT
PACK CARDIAC CATHETERIZATION (CUSTOM PROCEDURE TRAY) ×2 IMPLANT
SHEATH PROBE COVER 6X72 (BAG) ×1 IMPLANT
TRANSDUCER W/STOPCOCK (MISCELLANEOUS) ×2 IMPLANT
TUBING CIL FLEX 10 FLL-RA (TUBING) ×2 IMPLANT

## 2020-11-18 NOTE — Interval H&P Note (Signed)
Cath Lab Visit (complete for each Cath Lab visit)  Clinical Evaluation Leading to the Procedure:   ACS: No.  Non-ACS:    Anginal Classification: CCS II  Anti-ischemic medical therapy: Minimal Therapy (1 class of medications)  Non-Invasive Test Results: Intermediate-risk stress test findings: cardiac mortality 1-3%/year  Prior CABG: No previous CABG      History and Physical Interval Note:  11/18/2020 11:05 AM  [name]  has presented today for surgery, with the diagnosis of abnormal coronary CT , DOE.  The various methods of treatment have been discussed with the patient and family. After consideration of risks, benefits and other options for treatment, the patient has consented to  Procedure(s): LEFT HEART CATH AND CORONARY ANGIOGRAPHY (N/A) as a surgical intervention.  The patient's history has been reviewed, patient examined, no change in status, stable for surgery.  I have reviewed the patient's chart and labs.  Questions were answered to the patient's satisfaction.     Shelva Majestic

## 2020-11-18 NOTE — Interval H&P Note (Signed)
Cath Lab Visit (complete for each Cath Lab visit)  Clinical Evaluation Leading to the Procedure:   ACS: No.  Non-ACS:    Anginal Classification: CCS II  Anti-ischemic medical therapy: Minimal Therapy (1 class of medications)  Non-Invasive Test Results: Intermediate-risk stress test findings: cardiac mortality 1-3%/year  Prior CABG: No previous CABG      History and Physical Interval Note:  11/18/2020 11:03 AM  [name]  has presented today for surgery, with the diagnosis of abnormal coronary CT , DOE.  The various methods of treatment have been discussed with the patient and family. After consideration of risks, benefits and other options for treatment, the patient has consented to  Procedure(s): LEFT HEART CATH AND CORONARY ANGIOGRAPHY (N/A) as a surgical intervention.  The patient's history has been reviewed, patient examined, no change in status, stable for surgery.  I have reviewed the patient's chart and labs.  Questions were answered to the patient's satisfaction.     Shelva Majestic

## 2020-11-21 MED FILL — Heparin Sod (Porcine)-NaCl IV Soln 1000 Unit/500ML-0.9%: INTRAVENOUS | Qty: 500 | Status: AC

## 2020-11-23 ENCOUNTER — Other Ambulatory Visit: Payer: Self-pay

## 2020-11-23 ENCOUNTER — Other Ambulatory Visit: Payer: Self-pay | Admitting: Physician Assistant

## 2020-11-23 ENCOUNTER — Ambulatory Visit (INDEPENDENT_AMBULATORY_CARE_PROVIDER_SITE_OTHER)
Admission: RE | Admit: 2020-11-23 | Discharge: 2020-11-23 | Disposition: A | Discharge status: Home / Self Care | Payer: Medicare Other | Source: Ambulatory Visit | Attending: Physician Assistant | Admitting: Physician Assistant

## 2020-11-23 DIAGNOSIS — I2510 Atherosclerotic heart disease of native coronary artery without angina pectoris: Secondary | ICD-10-CM

## 2020-11-23 DIAGNOSIS — I10 Essential (primary) hypertension: Secondary | ICD-10-CM

## 2020-11-23 DIAGNOSIS — E785 Hyperlipidemia, unspecified: Secondary | ICD-10-CM

## 2020-11-23 DIAGNOSIS — E278 Other specified disorders of adrenal gland: Secondary | ICD-10-CM

## 2020-11-23 DIAGNOSIS — R0609 Other forms of dyspnea: Secondary | ICD-10-CM

## 2020-11-23 DIAGNOSIS — R590 Localized enlarged lymph nodes: Secondary | ICD-10-CM

## 2020-11-23 DIAGNOSIS — R0989 Other specified symptoms and signs involving the circulatory and respiratory systems: Secondary | ICD-10-CM

## 2020-11-27 NOTE — Progress Notes (Signed)
Patient ID: [name], female    DOB: 12/31/1945  MRN: [id number]  CC: Diabetes Follow-Up   Subjective: [name] is a 75 y.o. female who presents for diabetes follow-up.  Her concerns today include:   DIABETES TYPE 2 FOLLOW-UP: 08/31/2020: - Hemoglobin A1c today close to goal at 8.2%, goal < 8%. This is improved from previous hemoglobin A1c of 9.2% on 08/01/2020. - Continue Sitagliptin-Metformin as prescribed. - Continue Lisinopril to assist with kidney protection.  12/01/2020: Reports taking Janumet, Lantus, and Lisinopril as prescribed. Endorses dietary indiscretion.   2. CARDIOLOGY FOLLOW-UP: 11/16/2020 [place] [facility] per PA Note: 4. Incidental findings on CT: hilar lymph nodes and adrenal mass - discussed findings with patient and advised she f/u with PCP to discuss further - will need consideration of chest CT 3 months from original study. Patient and daughter verbalized understanding. We will go ahead and arrange the adrenal protocol abdominal CT as recommended.    Patient Active Problem List   Diagnosis Date Noted   Carotid stenosis 12/01/2020   CAD in native artery    DOE (dyspnea on exertion)    Diastolic dysfunction 40/98/1191   Cyst of right ovary 07/28/2017   Pure hypercholesterolemia 09/10/2016   Thyroid nodule 09/10/2016   COPD (chronic obstructive pulmonary disease) ([facility]) 09/10/2016   Family history of colon cancer in mother 06/06/2016   Nuclear sclerotic cataract of both eyes 04/08/2013   Type 2 diabetes mellitus without complication, with long-term current use of insulin ([place]) 09/13/2011   HTN (hypertension) 09/13/2011   Tobacco user 09/13/2011   DDD (degenerative disc disease), lumbar 09/13/2011   History of rectal polypectomy 08/31/2010     Current Outpatient Medications on File Prior to Visit  Medication Sig Dispense Refill   Accu-Chek Softclix Lancets lancets USE UP TO 4 TIMES DAILY AS  DIRECTED 200 each 6   acetaminophen  (TYLENOL) 500 MG tablet Take 500-1,000 mg by mouth daily as needed (pain).     amLODipine (NORVASC) 5 MG tablet Take 1 tablet (5 mg total) by mouth daily. 90 tablet 3   aspirin EC 81 MG tablet Take 81 mg by mouth daily. Swallow whole.     blood glucose meter kit and supplies KIT Dispense based on patient and insurance preference. Use up to four times daily as directed. (FOR ICD-9 250.00, 250.01). 1 each 0   blood glucose meter kit and supplies Dispense based on insurance preference. Use up to four times daily as directed. (FOR ICD-10 E11.9 E11.65) AccuCheck Meter     1 each 0   Cholecalciferol (VITAMIN D3) 50 MCG (2000 UT) TABS Take 2,000 Units by mouth in the morning.     Fluticasone-Umeclidin-Vilant (TRELEGY ELLIPTA) 200-62.5-25 MCG/INH AEPB Inhale 1 puff into the lungs daily. 60 each 11   glucose blood (CONTOUR NEXT TEST) test strip Use as instructed 100 each 12   metoprolol succinate (TOPROL-XL) 25 MG 24 hr tablet TAKE 1 TABLET BY MOUTH  DAILY 90 tablet 0   nystatin (MYCOSTATIN/NYSTOP) powder Apply 1 application topically 3 (three) times daily. (Patient taking differently: Apply 1 application topically in the morning and at bedtime.) 60 g 2   Current Facility-Administered Medications on File Prior to Visit  Medication Dose Route Frequency Provider Last Rate Last Admin   sodium chloride flush (NS) 0.9 % injection 3 mL  3 mL Intravenous Q12H Dunn, Dayna N, PA-C        No Known Allergies  Social History   Socioeconomic  History   Marital status: Divorced    Spouse name: Not on file   Number of children: 3   Years of education: Not on file   Highest education level: Not on file  Occupational History   Occupation: retired    Comment: [employer] Use   Smoking status: Every Day    Packs/day: 2.50    Years: 60.00    Pack years: 150.00    Types: Cigarettes   Smokeless tobacco: Never   Tobacco comments:    1 ppd  Vaping Use   Vaping Use: Never used  Substance and  Sexual Activity   Alcohol use: No   Drug use: No   Sexual activity: Not on file  Other Topics Concern   Not on file  Social History Narrative   Marital status: divorced; not dating in 2019 but interested slightly.        Children:  3 children; 7 seven grandchildren; 1 gg      Lives: with oldest daughter, 2 grandchildren, 53 gg, 1 friend of daughters, daughter's boyfriend.       Employment: retired [employer] [place] age 44.      Tobacco; 1ppd x 50 years.  Not interested in 2019.      Alcohol: none      Exercise: none in 2019. [place]      ADLs:   independent with ADLs; no assistant devices      Advanced Directives:  None; DNR/DNI; no HCPOA.    Social Determinants of Health   Financial Resource Strain: Not on file  Food Insecurity: Not on file  Transportation Needs: Not on file  Physical Activity: Not on file  Stress: Not on file  Social Connections: Not on file  Intimate Partner Violence: Not on file    Family History  Problem Relation Age of Onset   Diabetes Sister    Diabetes Brother    Hypertension Brother    Cancer Mother 30       colon(age 41) and lung (age 63)   Diabetes Mother    Heart disease Mother 27       CABG   Hyperlipidemia Mother    Cancer Father 72       stomach   Diabetes Maternal Grandmother    Mental illness Sister     Past Surgical History:  Procedure Laterality Date   ABDOMINAL HYSTERECTOMY  2000   fibroids; ovaries intact.   BACK SURGERY     plate put in back   CHOLECYSTECTOMY N/A 05/01/2017   Procedure: LAPAROSCOPIC CHOLECYSTECTOMY WITH INTRAOPERATIVE CHOLANGIOGRAM;  Surgeon: Donnie Mesa, MD;  Location: [facility];  Service: General;  Laterality: N/A;   LEFT HEART CATH AND CORONARY ANGIOGRAPHY N/A 11/18/2020   Procedure: LEFT HEART CATH AND CORONARY ANGIOGRAPHY;  Surgeon: Troy Sine, MD;  Location: [facility];  Service: Cardiovascular;  Laterality: N/A;   RECTAL POLYPECTOMY  05/16/2010   TVAdenoma polyp   TUBAL  LIGATION      ROS: Review of Systems Negative except as stated above  PHYSICAL EXAM: BP 122/65 (BP Location: Left Arm, Patient Position: Sitting, Cuff Size: Normal)   Pulse (!) 55   Temp 98.2 F (36.8 C)   Resp 18   Ht 5' 7.01" (1.702 m)   Wt 165 lb (74.8 kg)   SpO2 96%   BMI 25.84 kg/m   Physical Exam HENT:     Head: Normocephalic and atraumatic.  Eyes:  Extraocular Movements: Extraocular movements intact.     Conjunctiva/sclera: Conjunctivae normal.     Pupils: Pupils are equal, round, and reactive to light.  Cardiovascular:     Rate and Rhythm: Normal rate and regular rhythm.     Pulses: Normal pulses.     Heart sounds: Normal heart sounds.  Pulmonary:     Effort: Pulmonary effort is normal.     Breath sounds: Normal breath sounds.  Musculoskeletal:     Cervical back: Normal range of motion and neck supple.  Neurological:     General: No focal deficit present.     Mental Status: She is alert and oriented to person, place, and time.  Psychiatric:        Mood and Affect: Mood normal.    Results for orders placed or performed in visit on 12/01/20  POCT glycosylated hemoglobin (Hb A1C)  Result Value Ref Range   Hemoglobin A1C 8.6 (A) 4.0 - 5.6 %   HbA1c POC (<> result, manual entry)     HbA1c, POC (prediabetic range)     HbA1c, POC (controlled diabetic range)      ASSESSMENT AND PLAN: 1. Type 2 diabetes mellitus without complication, with long-term current use of insulin ([facility]): - Hemoglobin A1c relative to goal today at 8.6%, goal < 8%. Patient endorses dietary indiscretion.  - Continue Sitagliptin-Metformin and Insulin Glargine as prescribed.  - Discussed the importance of healthy eating habits, low-carbohydrate diet, low-sugar diet, regular aerobic exercise (at least 150 minutes a week as tolerated) and medication compliance to achieve or maintain control of diabetes. - Follow-up with primary provider in 3 months or sooner if needed.  - POCT glycosylated  hemoglobin (Hb A1C) - sitaGLIPtin-metformin (JANUMET) 50-1000 MG tablet; Take 1 tablet by mouth 2 (two) times daily with a meal.  Dispense: 180 tablet; Refill: 0 - insulin glargine (LANTUS SOLOSTAR) 100 UNIT/ML Solostar Pen; INJECT SUBCUTANEOUSLY 55  UNITS DAILY AT 10PM  Dispense: 51 mL; Refill: 0 - Insulin Pen Needle (BD PEN NEEDLE NANO U/F) 32G X 4 MM MISC; Inject 1 Dose into the skin daily.  Dispense: 100 each; Refill: 0  2. Essential hypertension: - Continue current regimen per Cardiology (Lisinopril 40 mg daily, Toprol-XL 25 mg daily, Amlodipine 5 mg daily).  - Keep all scheduled appointments with Cardiology.  - lisinopril (ZESTRIL) 40 MG tablet; Take 1 tablet (40 mg total) by mouth daily.  Dispense: 90 tablet; Refill: 0  2. Renal cyst, left: - Referral to Nephrology for further evaluation and management. - Ambulatory referral to Nephrology  3. Hilar lymphadenopathy: - Referral to Pulmonology for further evaluation and management. - Ambulatory referral to Pulmonology  Patient was given the opportunity to ask questions.  Patient verbalized understanding of the plan and was able to repeat key elements of the plan. Patient was given clear instructions to go to Emergency Department or return to medical center if symptoms don't improve, worsen, or new problems develop.The patient verbalized understanding.   Orders Placed This Encounter  Procedures   Ambulatory referral to Nephrology   Ambulatory referral to Pulmonology   POCT glycosylated hemoglobin (Hb A1C)     Requested Prescriptions   Signed Prescriptions Disp Refills   sitaGLIPtin-metformin (JANUMET) 50-1000 MG tablet 180 tablet 0    Sig: Take 1 tablet by mouth 2 (two) times daily with a meal.   lisinopril (ZESTRIL) 40 MG tablet 90 tablet 0    Sig: Take 1 tablet (40 mg total) by mouth daily.   insulin glargine (LANTUS  SOLOSTAR) 100 UNIT/ML Solostar Pen 51 mL 0    Sig: INJECT SUBCUTANEOUSLY 55  UNITS DAILY AT 10PM   Insulin  Pen Needle (BD PEN NEEDLE NANO U/F) 32G X 4 MM MISC 100 each 0    Sig: Inject 1 Dose into the skin daily.    Follow-up with primary provider as scheduled.   Camillia Herter, NP

## 2020-11-28 ENCOUNTER — Ambulatory Visit (HOSPITAL_COMMUNITY)
Admission: RE | Admit: 2020-11-28 | Discharge: 2020-11-28 | Disposition: A | Discharge status: Home / Self Care | Payer: Medicare Other | Source: Ambulatory Visit | Attending: Cardiology | Admitting: Cardiology

## 2020-11-28 ENCOUNTER — Other Ambulatory Visit: Payer: Self-pay

## 2020-11-28 DIAGNOSIS — R0989 Other specified symptoms and signs involving the circulatory and respiratory systems: Secondary | ICD-10-CM

## 2020-11-28 DIAGNOSIS — E278 Other specified disorders of adrenal gland: Secondary | ICD-10-CM

## 2020-11-28 DIAGNOSIS — R0609 Other forms of dyspnea: Secondary | ICD-10-CM | POA: Diagnosis present

## 2020-11-28 DIAGNOSIS — I10 Essential (primary) hypertension: Secondary | ICD-10-CM

## 2020-11-28 DIAGNOSIS — E785 Hyperlipidemia, unspecified: Secondary | ICD-10-CM | POA: Diagnosis present

## 2020-11-28 DIAGNOSIS — R590 Localized enlarged lymph nodes: Secondary | ICD-10-CM | POA: Diagnosis present

## 2020-11-28 DIAGNOSIS — I2510 Atherosclerotic heart disease of native coronary artery without angina pectoris: Secondary | ICD-10-CM

## 2020-12-01 ENCOUNTER — Ambulatory Visit: Payer: Medicare Other | Admitting: Cardiology

## 2020-12-01 ENCOUNTER — Ambulatory Visit (INDEPENDENT_AMBULATORY_CARE_PROVIDER_SITE_OTHER): Payer: Medicare Other | Admitting: Family

## 2020-12-01 ENCOUNTER — Encounter: Payer: Self-pay | Admitting: Cardiology

## 2020-12-01 ENCOUNTER — Other Ambulatory Visit: Payer: Self-pay

## 2020-12-01 VITALS — BP 122/65 | HR 55 | Temp 98.2°F | Resp 18 | Ht 67.01 in | Wt 165.0 lb

## 2020-12-01 VITALS — BP 120/72 | HR 97 | Ht 67.0 in | Wt 166.4 lb

## 2020-12-01 DIAGNOSIS — R590 Localized enlarged lymph nodes: Secondary | ICD-10-CM | POA: Diagnosis not present

## 2020-12-01 DIAGNOSIS — E7800 Pure hypercholesterolemia, unspecified: Secondary | ICD-10-CM | POA: Diagnosis not present

## 2020-12-01 DIAGNOSIS — I10 Essential (primary) hypertension: Secondary | ICD-10-CM

## 2020-12-01 DIAGNOSIS — E119 Type 2 diabetes mellitus without complications: Secondary | ICD-10-CM | POA: Diagnosis not present

## 2020-12-01 DIAGNOSIS — I2510 Atherosclerotic heart disease of native coronary artery without angina pectoris: Secondary | ICD-10-CM | POA: Diagnosis not present

## 2020-12-01 DIAGNOSIS — I6529 Occlusion and stenosis of unspecified carotid artery: Secondary | ICD-10-CM | POA: Insufficient documentation

## 2020-12-01 DIAGNOSIS — Z794 Long term (current) use of insulin: Secondary | ICD-10-CM | POA: Diagnosis not present

## 2020-12-01 DIAGNOSIS — N281 Cyst of kidney, acquired: Secondary | ICD-10-CM | POA: Diagnosis not present

## 2020-12-01 DIAGNOSIS — I351 Nonrheumatic aortic (valve) insufficiency: Secondary | ICD-10-CM

## 2020-12-01 LAB — POCT GLYCOSYLATED HEMOGLOBIN (HGB A1C): Hemoglobin A1C: 8.6 % — AB (ref 4.0–5.6)

## 2020-12-01 MED ORDER — BD PEN NEEDLE NANO U/F 32G X 4 MM MISC: 1.0000 | Freq: Every day | 0 refills | Status: DC

## 2020-12-01 MED ORDER — LISINOPRIL 40 MG PO TABS: 40.0000 mg | ORAL_TABLET | Freq: Every day | ORAL | 0 refills | Status: DC

## 2020-12-01 MED ORDER — LANTUS SOLOSTAR 100 UNIT/ML ~~LOC~~ SOPN: PEN_INJECTOR | SUBCUTANEOUS | 0 refills | Status: DC

## 2020-12-01 MED ORDER — PRAVASTATIN SODIUM 80 MG PO TABS: 80.0000 mg | ORAL_TABLET | Freq: Every evening | ORAL | 3 refills | Status: DC

## 2020-12-01 MED ORDER — JANUMET 50-1000 MG PO TABS: 1.0000 | ORAL_TABLET | Freq: Two times a day (BID) | ORAL | 0 refills | Status: DC

## 2020-12-01 NOTE — Patient Instructions (Addendum)
Medication Instructions:  Your physician recommends that you continue on your current medications as directed. Please refer to the Current Medication list given to you today.  1) INCREASE pravastatin to 80 mg daily   *If you need a refill on your cardiac medications before your next appointment, please call your pharmacy*  Lab Work: IN 6 WEEKS: FLP and ALT If you have labs (blood work) drawn today and your tests are completely normal, you will receive your results only by: [place] (if you have MyChart) OR A paper copy in the mail If you have any lab test that is abnormal or we need to change your treatment, we will call you to review the results.   Follow-Up: At [facility], you and your health needs are our priority.  As part of our continuing mission to provide you with exceptional heart care, we have created designated Provider Care Teams.  These Care Teams include your primary Cardiologist (physician) and Advanced Practice Providers (APPs -  Physician Assistants and Nurse Practitioners) who all work together to provide you with the care you need, when you need it.   Your next appointment:   1 year(s)  The format for your next appointment:   In Person  Provider:   Fransico Him, MD

## 2020-12-01 NOTE — Progress Notes (Signed)
Diabetes discussed in office.

## 2020-12-01 NOTE — Addendum Note (Signed)
Addended by: Antonieta Iba on: 12/01/2020 03:47 PM   Modules accepted: Orders

## 2020-12-01 NOTE — Progress Notes (Signed)
Cardiology CONSULT Note    Date:  12/01/2020   ID:  [name], DOB November 28, 1945, MRN [id number]  PCP:  Camillia Herter, NP  Cardiologist:  Fransico Him, MD   Chief Complaint  Patient presents with   Coronary Artery Disease   Hypertension   Hyperlipidemia   Aortic Insuffiency    History of Present Illness:  [name] is a 75 y.o. female  with a hx of COPD, HTN, HLD, DM and DOE.  When I initially saw her she was complaining of significant shortness of breath.  2D echo in June 2022 showed normal LV function with EF 65 to 70% with moderate LVH, grade 1 diastolic dysfunction normal pulmonary pressures trivial MR and mild to moderate AR.    She underwent coronary CTA showing severe three-vessel coronary calcification with 1 to 44% ostial left main, 7099% long calcified plaque in the ostial LAD through the proximal segment, 25 to 49% mid LAD stenosis and 1 to 24% distal LAD stenosis.  There is also 25 to 49% proximal circumflex stenosis and 70 to 99% mid RCA stenosis.  Cardiac cath done 11/18/2020 showed 20% mid RCA and RPDA, 35% ostial to proximal LAD, 5% ostial left circumflex and 20% mid LAD with normal LV function.  The EDP was normal at 13 mmHg.  Medical therapy was recommended.  She is here today for followup and is doing well.  She denies any chest pain or pressure,  PND, orthopnea, LE edema, dizziness, palpitations or syncope. She has chronic DOE related to ongoing smoking.  She is compliant with her meds and is tolerating meds with no SE.     Past Medical History:  Diagnosis Date   Carotid stenosis    1-39% bilateral stenosis   COPD (chronic obstructive pulmonary disease) ([facility])    Diabetes mellitus    Hyperlipidemia    Hypertension    Night sweats    Rectal polyp     Past Surgical History:  Procedure Laterality Date   ABDOMINAL HYSTERECTOMY  2000   fibroids; ovaries intact.   BACK SURGERY     plate put in back   CHOLECYSTECTOMY N/A 05/01/2017   Procedure:  LAPAROSCOPIC CHOLECYSTECTOMY WITH INTRAOPERATIVE CHOLANGIOGRAM;  Surgeon: Donnie Mesa, MD;  Location: [facility];  Service: General;  Laterality: N/A;   LEFT HEART CATH AND CORONARY ANGIOGRAPHY N/A 11/18/2020   Procedure: LEFT HEART CATH AND CORONARY ANGIOGRAPHY;  Surgeon: Troy Sine, MD;  Location: [facility];  Service: Cardiovascular;  Laterality: N/A;   RECTAL POLYPECTOMY  05/16/2010   TVAdenoma polyp   TUBAL LIGATION      Current Medications: Current Meds  Medication Sig   Accu-Chek Softclix Lancets lancets USE UP TO 4 TIMES DAILY AS  DIRECTED   acetaminophen (TYLENOL) 500 MG tablet Take 500-1,000 mg by mouth daily as needed (pain).   amLODipine (NORVASC) 5 MG tablet Take 1 tablet (5 mg total) by mouth daily.   aspirin EC 81 MG tablet Take 81 mg by mouth daily. Swallow whole.   blood glucose meter kit and supplies KIT Dispense based on patient and insurance preference. Use up to four times daily as directed. (FOR ICD-9 250.00, 250.01).   blood glucose meter kit and supplies Dispense based on insurance preference. Use up to four times daily as directed. (FOR ICD-10 E11.9 E11.65) AccuCheck Meter       Cholecalciferol (VITAMIN D3) 50 MCG (2000 UT) TABS Take 2,000 Units by mouth in the morning.  Fluticasone-Umeclidin-Vilant (TRELEGY ELLIPTA) 200-62.5-25 MCG/INH AEPB Inhale 1 puff into the lungs daily.   glucose blood (CONTOUR NEXT TEST) test strip Use as instructed   insulin glargine (LANTUS SOLOSTAR) 100 UNIT/ML Solostar Pen INJECT SUBCUTANEOUSLY 55  UNITS DAILY AT 10PM   Insulin Pen Needle (BD PEN NEEDLE NANO U/F) 32G X 4 MM MISC Inject 1 Dose into the skin daily.   lisinopril (ZESTRIL) 40 MG tablet TAKE 1 TABLET BY MOUTH  DAILY   metoprolol succinate (TOPROL-XL) 25 MG 24 hr tablet TAKE 1 TABLET BY MOUTH  DAILY   nystatin (MYCOSTATIN/NYSTOP) powder Apply 1 application topically 3 (three) times daily. (Patient taking differently: Apply 1 application topically in the morning and at  bedtime.)   pravastatin (PRAVACHOL) 40 MG tablet TAKE 1 TABLET BY MOUTH IN  THE EVENING AFTER A MEAL   sitaGLIPtin-metformin (JANUMET) 50-1000 MG tablet Take 1 tablet by mouth 2 (two) times daily with a meal.   Current Facility-Administered Medications for the 12/01/20 encounter (Office Visit) with Sueanne Margarita, MD  Medication   sodium chloride flush (NS) 0.9 % injection 3 mL    Allergies:   Patient has no known allergies.   Social History   Socioeconomic History   Marital status: Divorced    Spouse name: Not on file   Number of children: 3   Years of education: Not on file   Highest education level: Not on file  Occupational History   Occupation: retired    Comment: [employer] Use   Smoking status: Every Day    Packs/day: 2.50    Years: 60.00    Pack years: 150.00    Types: Cigarettes   Smokeless tobacco: Never   Tobacco comments:    1 ppd  Vaping Use   Vaping Use: Never used  Substance and Sexual Activity   Alcohol use: No   Drug use: No   Sexual activity: Not on file  Other Topics Concern   Not on file  Social History Narrative   Marital status: divorced; not dating in 2019 but interested slightly.        Children:  3 children; 7 seven grandchildren; 1 gg      Lives: with oldest daughter, 2 grandchildren, 33 gg, 1 friend of daughters, daughter's boyfriend.       Employment: retired [employer] age 62.      Tobacco; 1ppd x 50 years.  Not interested in 2019.      Alcohol: none      Exercise: none in 2019. [place]      ADLs:   independent with ADLs; no assistant devices      Advanced Directives:  None; DNR/DNI; no HCPOA.    Social Determinants of Health   Financial Resource Strain: Not on file  Food Insecurity: Not on file  Transportation Needs: Not on file  Physical Activity: Not on file  Stress: Not on file  Social Connections: Not on file     Family History:  The patient's family history includes Cancer (age of  onset: 7) in her father; Cancer (age of onset: 58) in her mother; Diabetes in her brother, maternal grandmother, mother, and sister; Heart disease (age of onset: 1) in her mother; Hyperlipidemia in her mother; Hypertension in her brother; Mental illness in her sister.   ROS:   Please see the history of present illness.    ROS All other systems reviewed and are negative.  No flowsheet data found.  PHYSICAL EXAM:   VS:  BP 120/72   Pulse 97   Ht _0  (1.702 m)   Wt 166 lb 6.4 oz (75.5 kg)   SpO2 95%   BMI 26.06 kg/m    GEN: Well nourished, well developed in no acute distress HEENT: Normal NECK: No JVD; No carotid bruits LYMPHATICS: No lymphadenopathy CARDIAC:RRR, no murmurs, rubs, gallops RESPIRATORY:  Clear to auscultation without rales, wheezing or rhonchi  ABDOMEN: Soft, non-tender, non-distended MUSCULOSKELETAL:  No edema; No deformity  SKIN: Warm and dry NEUROLOGIC:  Alert and oriented x 3 PSYCHIATRIC:  Normal affect   Wt Readings from Last 3 Encounters:  12/01/20 166 lb 6.4 oz (75.5 kg)  12/01/20 165 lb (74.8 kg)  11/18/20 160 lb (72.6 kg)      Studies/Labs Reviewed:   EKG:  EKG is not ordered today.      Recent Labs: 10/11/2020: NT-Pro BNP 74 11/16/2020: ALT 12; BUN 17; Creatinine, Ser 0.92; Hemoglobin 13.3; Platelets 364; Potassium 5.1; Sodium 140; TSH 1.220   Lipid Panel    Component Value Date/Time   CHOL 162 08/01/2020 0908   TRIG 140 08/01/2020 0908   HDL 50 08/01/2020 0908   CHOLHDL 3.2 08/01/2020 0908   CHOLHDL 3.0 02/07/2015 1314   VLDL 31 (H) 02/07/2015 1314   LDLCALC 87 08/01/2020 0908   LDLDIRECT 85 04/17/2012 1349  Additional studies/ records that were reviewed today include:  OV notes from PCP  ASSESSMENT:    1. CAD in native artery   2. Primary hypertension   3. Pure hypercholesterolemia   4. Nonrheumatic aortic valve insufficiency      PLAN:  In order of problems listed above:  ASCAD -coronary CTA showing severe three-vessel  coronary calcification with 1 to 44% ostial left main, 7099% long calcified plaque in the ostial LAD through the proximal segment, 25 to 49% mid LAD stenosis and 1 to 24% distal LAD stenosis.  There was also 25 to 49% proximal circumflex stenosis and 70 to 99% mid RCA stenosis.  -Cardiac cath done 11/18/2020 showed 20% mid RCA and RPDA, 35% ostial to proximal LAD, 5% ostial left circumflex and 20% mid LAD with normal LV function and normal LVEDP was normal at 13 mmHg.  -Continue medical management with aggressive risk factor modification. -she denies any anginal sx and I think her DOE is related to ongoing tobacco use -Continue prescription drug management with aspirin 81 mg daily, amlodipine 5 mg daily, Toprol-XL 25 mg daily and pravastatin   2.  HTN -BP is controlled on exam today -Continue prescription drug management with lisinopril 40 mg daily, Toprol-XL 25 mg daily, amlodipine 5 mg daily with as needed refills  3.  HLD -With her new diagnosis of CAD her LDL goal is now less than 70 -I have personally reviewed and interpreted outside labs performed by patient's PCP which showed LDL 87, HDL 50, ALT 13 in July 2022  -Increase Pravachol to 80 mg daily and repeat FLP and ALT in 6 weeks  4.  Aortic insufficiency -mild to moderate by echo with mild AVSC June 2022 -repeat echo in 1 year  5.  Carotid artery stenosis -dopplers 11/2020 showed 1-39% bilateral stenosis -continue ASA and statin  Time Spent: 25 minutes total time of encounter, including 15 minutes spent in face-to-face patient care on the date of this encounter. This time includes coordination of care and counseling regarding above mentioned problem list. Remainder of non-face-to-face time involved reviewing chart documents/testing relevant to the patient encounter  and documentation in the medical record. I have independently reviewed documentation from referring provider  Medication Adjustments/Labs and Tests Ordered: Current  medicines are reviewed at length with the patient today.  Concerns regarding medicines are outlined above.  Medication changes, Labs and Tests ordered today are listed in the Patient Instructions below.  There are no Patient Instructions on file for this visit.  Followup with me in 1 year for AI   Signed, Fransico Him, MD  12/01/2020 3:42 PM    [facility] [place], [place], [place]  [address] Phone: [phone]; Fax: [phone]

## 2020-12-01 NOTE — Progress Notes (Signed)
Pt presents for diabetes follow-up, needs medication refill on Janumet 50-1000mg 

## 2020-12-06 ENCOUNTER — Other Ambulatory Visit: Payer: Self-pay | Admitting: Family

## 2020-12-06 DIAGNOSIS — I10 Essential (primary) hypertension: Secondary | ICD-10-CM

## 2020-12-06 DIAGNOSIS — E119 Type 2 diabetes mellitus without complications: Secondary | ICD-10-CM

## 2020-12-07 ENCOUNTER — Telehealth: Payer: Self-pay | Admitting: Family

## 2020-12-07 NOTE — Telephone Encounter (Signed)
sitaGLIPtin-metformin (JANUMET) 50-1000 MG tablet [[id number]]     Pharmacy  [facility] - [place], [place]  [address] [place] [address]  Phone:  [phone]  Fax:  [phone]

## 2020-12-13 NOTE — Telephone Encounter (Signed)
Refill completed 11/17

## 2020-12-16 ENCOUNTER — Other Ambulatory Visit: Payer: Self-pay | Admitting: Family

## 2020-12-16 ENCOUNTER — Other Ambulatory Visit: Payer: Self-pay

## 2020-12-16 DIAGNOSIS — N281 Cyst of kidney, acquired: Secondary | ICD-10-CM

## 2020-12-16 DIAGNOSIS — I10 Essential (primary) hypertension: Secondary | ICD-10-CM

## 2020-12-16 MED ORDER — METOPROLOL SUCCINATE ER 25 MG PO TB24: 25.0000 mg | ORAL_TABLET | Freq: Every day | ORAL | 0 refills | Status: DC

## 2020-12-16 NOTE — Telephone Encounter (Signed)
Medication refilled

## 2021-01-18 ENCOUNTER — Other Ambulatory Visit: Payer: Self-pay

## 2021-01-18 ENCOUNTER — Other Ambulatory Visit: Payer: Medicare Other | Admitting: *Deleted

## 2021-01-18 DIAGNOSIS — I10 Essential (primary) hypertension: Secondary | ICD-10-CM

## 2021-01-18 DIAGNOSIS — I351 Nonrheumatic aortic (valve) insufficiency: Secondary | ICD-10-CM

## 2021-01-18 DIAGNOSIS — I2510 Atherosclerotic heart disease of native coronary artery without angina pectoris: Secondary | ICD-10-CM

## 2021-01-18 DIAGNOSIS — E7800 Pure hypercholesterolemia, unspecified: Secondary | ICD-10-CM

## 2021-01-18 LAB — LIPID PANEL
Chol/HDL Ratio: 3 ratio (ref 0.0–4.4)
Cholesterol, Total: 127 mg/dL (ref 100–199)
HDL: 42 mg/dL (ref >39)
LDL Chol Calc (NIH): 63 mg/dL (ref 0–99)
Triglycerides: 125 mg/dL (ref 0–149)
VLDL Cholesterol Cal: 22 mg/dL (ref 5–40)

## 2021-01-18 LAB — ALT: ALT: 12 IU/L (ref 0–32)

## 2021-01-27 ENCOUNTER — Ambulatory Visit: Payer: Medicare Other | Admitting: Pulmonary Disease

## 2021-01-27 ENCOUNTER — Encounter: Payer: Self-pay | Admitting: Pulmonary Disease

## 2021-01-27 ENCOUNTER — Other Ambulatory Visit: Payer: Self-pay

## 2021-01-27 VITALS — BP 124/76 | HR 82 | Ht 67.0 in | Wt 169.2 lb

## 2021-01-27 DIAGNOSIS — J431 Panlobular emphysema: Secondary | ICD-10-CM

## 2021-01-27 DIAGNOSIS — R0602 Shortness of breath: Secondary | ICD-10-CM

## 2021-01-27 MED ORDER — TRELEGY ELLIPTA 200-62.5-25 MCG/ACT IN AEPB: 1.0000 | INHALATION_SPRAY | Freq: Every day | RESPIRATORY_TRACT | 5 refills | Status: DC

## 2021-01-27 MED ORDER — ALBUTEROL SULFATE HFA 108 (90 BASE) MCG/ACT IN AERS: 2.0000 | INHALATION_SPRAY | Freq: Four times a day (QID) | RESPIRATORY_TRACT | 2 refills | Status: DC | PRN

## 2021-01-27 NOTE — Progress Notes (Signed)
[name]    [id number]    09/03/1945  Primary Care Physician:[name], Flonnie Hailstone, NP  Referring Physician: Camillia Herter, NP [address] [place],  [place] [address]  Chief complaint:   Follow-up for shortness of breath   HPI:  Has had shortness of breath for many years  She is down to about half a pack of cigarettes a day from a pack a day  Was switched to Trelegy during last visit, tolerating this well, feels symptoms are better  Shortness of breath with mild exertion  Occasional cough, not really bringing up any secretions No weight loss  History of diabetes, hypertension-working on better control  Parents did smoke   Outpatient Encounter Medications as of 01/27/2021  Medication Sig   Accu-Chek Softclix Lancets lancets USE UP TO 4 TIMES DAILY AS  DIRECTED   acetaminophen (TYLENOL) 500 MG tablet Take 500-1,000 mg by mouth daily as needed (pain).   amLODipine (NORVASC) 5 MG tablet Take 1 tablet (5 mg total) by mouth daily.   aspirin EC 81 MG tablet Take 81 mg by mouth daily. Swallow whole.   blood glucose meter kit and supplies KIT Dispense based on patient and insurance preference. Use up to four times daily as directed. (FOR ICD-9 250.00, 250.01).   blood glucose meter kit and supplies Dispense based on insurance preference. Use up to four times daily as directed. (FOR ICD-10 E11.9 E11.65) AccuCheck Meter       Cholecalciferol (VITAMIN D3) 50 MCG (2000 UT) TABS Take 2,000 Units by mouth in the morning.   Fluticasone-Umeclidin-Vilant (TRELEGY ELLIPTA) 200-62.5-25 MCG/INH AEPB Inhale 1 puff into the lungs daily.   glucose blood (CONTOUR NEXT TEST) test strip Use as instructed   insulin glargine (LANTUS SOLOSTAR) 100 UNIT/ML Solostar Pen INJECT SUBCUTANEOUSLY 55  UNITS DAILY AT 10PM   Insulin Pen Needle (BD PEN NEEDLE NANO U/F) 32G X 4 MM MISC Inject 1 Dose into the skin daily.   lisinopril (ZESTRIL) 40 MG tablet Take 1 tablet (40 mg total) by  mouth daily.   metoprolol succinate (TOPROL-XL) 25 MG 24 hr tablet Take 1 tablet (25 mg total) by mouth daily.   nystatin (MYCOSTATIN/NYSTOP) powder Apply 1 application topically 3 (three) times daily. (Patient taking differently: Apply 1 application topically in the morning and at bedtime.)   pravastatin (PRAVACHOL) 80 MG tablet Take 1 tablet (80 mg total) by mouth every evening.   sitaGLIPtin-metformin (JANUMET) 50-1000 MG tablet Take 1 tablet by mouth 2 (two) times daily with a meal.   Facility-Administered Encounter Medications as of 01/27/2021  Medication   sodium chloride flush (NS) 0.9 % injection 3 mL    Allergies as of 01/27/2021   (No Known Allergies)    Past Medical History:  Diagnosis Date   Carotid stenosis    1-39% bilateral stenosis   COPD (chronic obstructive pulmonary disease) (HCC)    Diabetes mellitus    Hyperlipidemia    Hypertension    Night sweats    Rectal polyp     Past Surgical History:  Procedure Laterality Date   ABDOMINAL HYSTERECTOMY  2000   fibroids; ovaries intact.   BACK SURGERY     plate put in back   CHOLECYSTECTOMY N/A 05/01/2017   Procedure: LAPAROSCOPIC CHOLECYSTECTOMY WITH INTRAOPERATIVE CHOLANGIOGRAM;  Surgeon: Donnie Mesa, MD;  Location: [facility];  Service: General;  Laterality: N/A;   LEFT HEART CATH AND  CORONARY ANGIOGRAPHY N/A 11/18/2020   Procedure: LEFT HEART CATH AND CORONARY ANGIOGRAPHY;  Surgeon: Troy Sine, MD;  Location: [facility];  Service: Cardiovascular;  Laterality: N/A;   RECTAL POLYPECTOMY  05/16/2010   TVAdenoma polyp   TUBAL LIGATION      Family History  Problem Relation Age of Onset   Diabetes Sister    Diabetes Brother    Hypertension Brother    Cancer Mother 34       colon(age 45) and lung (age 61)   Diabetes Mother    Heart disease Mother 73       CABG   Hyperlipidemia Mother    Cancer Father 37       stomach   Diabetes Maternal Grandmother    Mental illness Sister     Social History    Socioeconomic History   Marital status: Divorced    Spouse name: Not on file   Number of children: 3   Years of education: Not on file   Highest education level: Not on file  Occupational History   Occupation: retired    Comment: [employer]  Tobacco Use   Smoking status: Every Day    Packs/day: 2.50    Years: 60.00    Pack years: 150.00    Types: Cigarettes   Smokeless tobacco: Never   Tobacco comments:    1 ppd  Vaping Use   Vaping Use: Never used  Substance and Sexual Activity   Alcohol use: No   Drug use: No   Sexual activity: Not on file  Other Topics Concern   Not on file  Social History Narrative   Marital status: divorced; not dating in 2019 but interested slightly.        Children:  3 children; 7 seven grandchildren; 1 gg      Lives: with oldest daughter, 2 grandchildren, 104 gg, 1 friend of daughters, daughter's boyfriend.       Employment: retired [employer] age 73.      Tobacco; 1ppd x 50 years.  Not interested in 2019.      Alcohol: none      Exercise: none in 2019[place]      ADLs:   independent with ADLs; no assistant devices      Advanced Directives:  None; DNR/DNI; no HCPOA.    Social Determinants of Health   Financial Resource Strain: Not on file  Food Insecurity: Not on file  Transportation Needs: Not on file  Physical Activity: Not on file  Stress: Not on file  Social Connections: Not on file  Intimate Partner Violence: Not on file    Review of Systems  Respiratory:  Positive for shortness of breath.    Vitals:   01/27/21 1202  BP: 124/76  Pulse: 82  SpO2: 95%     Physical Exam Constitutional:      Appearance: She is obese.  HENT:     Head: Atraumatic.     Nose: No congestion or rhinorrhea.     Mouth/Throat:     Pharynx: No oropharyngeal exudate.  Eyes:     General:        Right eye: No discharge.        Left eye: No discharge.  Cardiovascular:     Rate and Rhythm: Normal rate and regular  rhythm.  Pulmonary:     Effort: No respiratory distress.     Breath sounds: No stridor. No wheezing or rhonchi.  Musculoskeletal:     Cervical back: No rigidity or tenderness.  Neurological:     Mental Status: She is alert.  Psychiatric:        Mood and Affect: Mood normal.    Data Reviewed: No recent x-ray  Assessment:  Shortness of breath on exertion  Advanced chronic obstructive pulmonary disease likely with significant emphysema  Active smoker  Tolerating Trelegy well Plan/Recommendations:  PFT with obstructive disease  Echo with some diastolic dysfunction  Continue Trelegy   Prescription for Trelegy and albuterol sent to pharmacy  Encouraged to work on continuing to cut down smoking   Follow-up in 6 months  Sherrilyn Rist MD [facility] 01/27/2021, 12:21 PM  CC: Camillia Herter, NP

## 2021-01-27 NOTE — Patient Instructions (Signed)
I will see you back in about 6 months  Continue with Trelegy We will send in a prescription for rescue inhaler to be used as needed  Continue to work on cutting down on smoking  Call with significant concerns

## 2021-01-31 ENCOUNTER — Other Ambulatory Visit: Payer: Self-pay | Admitting: *Deleted

## 2021-01-31 MED ORDER — ALBUTEROL SULFATE HFA 108 (90 BASE) MCG/ACT IN AERS: 2.0000 | INHALATION_SPRAY | Freq: Four times a day (QID) | RESPIRATORY_TRACT | 3 refills | Status: DC | PRN

## 2021-02-05 ENCOUNTER — Other Ambulatory Visit: Payer: Self-pay | Admitting: Family

## 2021-02-05 DIAGNOSIS — E119 Type 2 diabetes mellitus without complications: Secondary | ICD-10-CM

## 2021-02-06 NOTE — Progress Notes (Signed)
Established Patient Office Visit  Subjective:  Patient ID: [name], female    DOB: September 20, 1945  Age: 76 y.o. MRN: [id number]  CC:  Chief Complaint  Patient presents with   Diabetes    Patient states that her sugars keeps dropping to as low as 54 even when eating during the day. Patient states she has not been dizzy or feeling different .    HPI [name] presents for t2dm  Last A1c:  Lab Results  Component Value Date   HGBA1C 8.6 (A) 12/01/2020    Currently taking: lantus 55 units nightly, farxiga 75m po qd, janumet 50-10042mpo bid ac. Notes hypoglycemia to 50s. Happens randomly. No symptoms. She checks her sugars diligently. Reports good compliance with medications Diet has been steady, healthier than previously Exercise habits have been limited   Past Medical History:  Diagnosis Date   Carotid stenosis    1-39% bilateral stenosis   COPD (chronic obstructive pulmonary disease) (HCC)    Diabetes mellitus    Hyperlipidemia    Hypertension    Night sweats    Rectal polyp     Past Surgical History:  Procedure Laterality Date   ABDOMINAL HYSTERECTOMY  2000   fibroids; ovaries intact.   BACK SURGERY     plate put in back   CHOLECYSTECTOMY N/A 05/01/2017   Procedure: LAPAROSCOPIC CHOLECYSTECTOMY WITH INTRAOPERATIVE CHOLANGIOGRAM;  Surgeon: TsDonnie MesaMD;  [facility] Service: General;  Laterality: N/A;   LEFT HEART CATH AND CORONARY ANGIOGRAPHY N/A 11/18/2020   Procedure: LEFT HEART CATH AND CORONARY ANGIOGRAPHY;  Surgeon: KeTroy SineMD;  [facility];  Service: Cardiovascular;  Laterality: N/A;   RECTAL POLYPECTOMY  05/16/2010   TVAdenoma polyp   TUBAL LIGATION      Family History  Problem Relation Age of Onset   Diabetes Sister    Diabetes Brother    Hypertension Brother    Cancer Mother [phone]     colon(age 6127and lung (age 77  Diabetes Mother    Heart disease Mother 7173     CABG   Hyperlipidemia Mother    Cancer  Father [phone]     stomach   Diabetes Maternal Grandmother    Mental illness Sister     Social History   Socioeconomic History   Marital status: Divorced    Spouse name: Not on file   Number of children: 3   Years of education: Not on file   Highest education level: Not on file  Occupational History   Occupation: retired    Comment: [employer]   Smoking status: Every Day    Packs/day: 2.50    Years: 60.00    Pack years: 150.00    Types: Cigarettes   Smokeless tobacco: Never   Tobacco comments:    1 ppd  Vaping Use   Vaping Use: Never used  Substance and Sexual Activity   Alcohol use: No   Drug use: No   Sexual activity: Not on file  Other Topics Concern   Not on file  Social History Narrative   Marital status: divorced; not dating in 2019 but interested slightly.        Children:  3 children; 7 seven grandchildren; 1 gg      Lives: with oldest daughter, 2 grandchildren, 1 36g, 1 friend of daughters, daughter's boyfriend.       Employment: retired [employer] [place] age 8.      Tobacco; 1ppd x 50 years.  Not interested in 2019.      Alcohol: none      Exercise: none in 2019. [place]      ADLs:   independent with ADLs; no assistant devices      Advanced Directives:  None; DNR/DNI; no HCPOA.    Social Determinants of Health   Financial Resource Strain: Not on file  Food Insecurity: Not on file  Transportation Needs: Not on file  Physical Activity: Not on file  Stress: Not on file  Social Connections: Not on file  Intimate Partner Violence: Not on file    Outpatient Medications Prior to Visit  Medication Sig Dispense Refill   blood glucose meter kit and supplies KIT Dispense based on patient and insurance preference. Use up to four times daily as directed. (FOR ICD-9 250.00, 250.01). 1 each 0   blood glucose meter kit and supplies Dispense based on insurance preference. Use up to four times daily as directed. (FOR ICD-10 E11.9  E11.65) AccuCheck Meter     1 each 0   ACCU-CHEK GUIDE test strip USE UP TO 4 TIMES DAILY AS  DIRECTED 200 strip 6   Accu-Chek Softclix Lancets lancets USE UP TO 4 TIMES DAILY AS  DIRECTED 200 each 6   aspirin 81 MG tablet Take 81 mg by mouth daily.     Cholecalciferol (VITAMIN D3) 1000 UNITS CAPS Take 2 capsules by mouth every morning.     dapagliflozin propanediol (FARXIGA) 5 MG TABS tablet Take 1 tablet (5 mg total) by mouth daily before breakfast. 90 tablet 0   fluticasone furoate-vilanterol (BREO ELLIPTA) 100-25 MCG/INH AEPB USE 1 INHALATION BY MOUTH  ONCE DAILY AT THE SAME TIME EACH DAY 180 each 3   insulin glargine (LANTUS SOLOSTAR) 100 UNIT/ML Solostar Pen INJECT SUBCUTANEOUSLY 55  UNITS DAILY AT 10PM 45 mL 3   Insulin Pen Needle (BD PEN NEEDLE NANO U/F) 32G X 4 MM MISC USE AS DIRECTED 90 each 1   JANUMET 50-1000 MG tablet TAKE 1 TABLET BY MOUTH  TWICE DAILY WITH A MEAL 180 tablet 1   lisinopril (ZESTRIL) 20 MG tablet Take 1 tablet (20 mg total) by mouth daily. 90 tablet 0   metoprolol succinate (TOPROL-XL) 25 MG 24 hr tablet TAKE 1 TABLET BY MOUTH  DAILY 90 tablet 3   pravastatin (PRAVACHOL) 40 MG tablet TAKE 1 TABLET BY MOUTH IN  THE EVENING AFTER A MEAL 90 tablet 0   SPIRIVA HANDIHALER 18 MCG inhalation capsule INHALE THE CONTENTS OF 1  CAPSULE BY MOUTH VIA  HANDIHALER DAILY 90 capsule 3   ibuprofen (ADVIL) 800 MG tablet Take 1 tablet (800 mg total) by mouth every 8 (eight) hours as needed. (Patient not taking: No sig reported) 30 tablet 0   metFORMIN (GLUCOPHAGE) 1000 MG tablet TAKE 1 TABLET BY MOUTH  TWICE DAILY WITH A MEAL (Patient not taking: No sig reported) 180 tablet 1   No facility-administered medications prior to visit.    No Known Allergies  ROS Review of Systems  Constitutional: Negative.   HENT: Negative.    Eyes: Negative.   Respiratory: Negative.    Cardiovascular: Negative.   Gastrointestinal: Negative.   Genitourinary: Negative.   Musculoskeletal:  Negative.   Skin: Negative.   Neurological: Negative.   Psychiatric/Behavioral: Negative.    All other systems reviewed and are negative.    Objective:    Physical Exam  Vitals and nursing note reviewed.  Constitutional:      General: She is not in acute distress.    Appearance: Normal appearance. She is normal weight. She is not ill-appearing, toxic-appearing or diaphoretic.  Cardiovascular:     Rate and Rhythm: Normal rate and regular rhythm.     Heart sounds: Normal heart sounds. No murmur heard.   No friction rub. No gallop.  Pulmonary:     Effort: Pulmonary effort is normal. No respiratory distress.     Breath sounds: Normal breath sounds. No stridor. No wheezing, rhonchi or rales.  Chest:     Chest wall: No tenderness.  Skin:    General: Skin is warm and dry.  Neurological:     General: No focal deficit present.     Mental Status: She is alert and oriented to person, place, and time. Mental status is at baseline.  Psychiatric:        Mood and Affect: Mood normal.        Behavior: Behavior normal.        Thought Content: Thought content normal.        Judgment: Judgment normal.    BP (!) 185/80    Pulse 87    Temp 97.9 F (36.6 C) (Temporal)    Resp 18    Ht _0  (1.702 m)    Wt 176 lb (79.8 kg)    SpO2 100%    BMI 27.57 kg/m  Wt Readings from Last 3 Encounters:  01/27/21 169 lb 3.2 oz (76.7 kg)  12/01/20 166 lb 6.4 oz (75.5 kg)  12/01/20 165 lb (74.8 kg)     Health Maintenance Due  Topic Date Due   Zoster Vaccines- Shingrix (1 of 2) Never done   FOOT EXAM  10/14/2019   COVID-19 Vaccine (5 - Booster for Pfizer series) 07/22/2020   INFLUENZA VACCINE  08/15/2020    There are no preventive care reminders to display for this patient.  Lab Results  Component Value Date   TSH 1.220 11/16/2020   Lab Results  Component Value Date   WBC 7.3 11/16/2020   HGB 13.3 11/16/2020   HCT 41.3 11/16/2020   MCV 81 11/16/2020   PLT 364 11/16/2020   Lab Results   Component Value Date   NA 140 11/16/2020   K 5.1 11/16/2020   CO2 25 11/16/2020   GLUCOSE 177 (H) 11/16/2020   BUN 17 11/16/2020   CREATININE 0.92 11/16/2020   BILITOT <0.2 11/16/2020   ALKPHOS 132 (H) 11/16/2020   AST 14 11/16/2020   ALT 12 01/18/2021   PROT 6.5 11/16/2020   ALBUMIN 4.1 11/16/2020   CALCIUM 10.1 11/16/2020   ANIONGAP 13 04/17/2017   EGFR 65 11/16/2020   Lab Results  Component Value Date   CHOL 127 01/18/2021   Lab Results  Component Value Date   HDL 42 01/18/2021   Lab Results  Component Value Date   LDLCALC 63 01/18/2021   Lab Results  Component Value Date   TRIG 125 01/18/2021   Lab Results  Component Value Date   CHOLHDL 3.0 01/18/2021   Lab Results  Component Value Date   HGBA1C 8.6 (A) 12/01/2020      Assessment & Plan:   Problem List Items Addressed This Visit   None Visit Diagnoses     Hypoglycemia    -  Primary   Relevant Orders   POCT glycosylated hemoglobin (Hb A1C) (Completed)   POCT glucose (manual entry) (Completed)  No orders of the defined types were placed in this encounter.   Follow-up: No follow-ups on file.   PLAN Glucose today 145. A1c today at 7.7 Discussed adequate sampling of home glucose including milking finger. Discussed how to combat hypoglycemia with small sugary snack. In lieu of symptoms, will hold off on adjusting therapy at this time. Continue to check sugars. Return in 3 mo Patient encouraged to call clinic with any questions, comments, or concerns.  Maximiano Coss, NP

## 2021-02-07 ENCOUNTER — Other Ambulatory Visit: Payer: Self-pay | Admitting: Family

## 2021-02-07 DIAGNOSIS — Z794 Long term (current) use of insulin: Secondary | ICD-10-CM

## 2021-02-19 ENCOUNTER — Other Ambulatory Visit: Payer: Self-pay | Admitting: Family

## 2021-02-19 DIAGNOSIS — E119 Type 2 diabetes mellitus without complications: Secondary | ICD-10-CM

## 2021-02-19 DIAGNOSIS — I10 Essential (primary) hypertension: Secondary | ICD-10-CM

## 2021-02-27 NOTE — Progress Notes (Signed)
Patient ID: [name], female    DOB: [birth year]  MRN: [id number]  CC: Diabetes Follow-Up   Subjective: [name] is a 76 y.o. female who presents for diabetes follow-up.   Her concerns today include:  DIABETES TYPE 2 FOLLOW-UP: 12/01/2020: - Hemoglobin A1c relative to goal today at 8.6%, goal < 8%. Patient endorses dietary indiscretion.  - Continue Sitagliptin-Metformin and Insulin Glargine as prescribed.   03/02/2021: Doing well on current regimen, no issues/concerns. Improved diet since last visit. Requesting completion of handicap placard.   Patient Active Problem List   Diagnosis Date Noted   Carotid stenosis 12/01/2020   CAD in native artery    DOE (dyspnea on exertion)    Diastolic dysfunction 44/92/0100   Cyst of right ovary 07/28/2017   Pure hypercholesterolemia 09/10/2016   Thyroid nodule 09/10/2016   COPD (chronic obstructive pulmonary disease) ([place]) 09/10/2016   Family history of colon cancer in mother 06/06/2016   Nuclear sclerotic cataract of both eyes 04/08/2013   Type 2 diabetes mellitus without complication, with long-term current use of insulin ([place]) 09/13/2011   HTN (hypertension) 09/13/2011   Tobacco user 09/13/2011   DDD (degenerative disc disease), lumbar 09/13/2011   History of rectal polypectomy 08/31/2010     Current Outpatient Medications on File Prior to Visit  Medication Sig Dispense Refill   Accu-Chek Softclix Lancets lancets USE UP TO 4 TIMES DAILY AS  DIRECTED 200 each 6   acetaminophen (TYLENOL) 500 MG tablet Take 500-1,000 mg by mouth daily as needed (pain).     albuterol (VENTOLIN HFA) 108 (90 Base) MCG/ACT inhaler Inhale 2 puffs into the lungs every 6 (six) hours as needed for wheezing or shortness of breath. 3 each 3   amLODipine (NORVASC) 5 MG tablet Take 1 tablet (5 mg total) by mouth daily. 90 tablet 3   aspirin EC 81 MG tablet Take 81 mg by mouth daily. Swallow whole.     blood glucose meter kit and supplies KIT Dispense based  on patient and insurance preference. Use up to four times daily as directed. (FOR ICD-9 250.00, 250.01). 1 each 0   blood glucose meter kit and supplies Dispense based on insurance preference. Use up to four times daily as directed. (FOR ICD-10 E11.9 E11.65) AccuCheck Meter     1 each 0   Cholecalciferol (VITAMIN D3) 50 MCG (2000 UT) TABS Take 2,000 Units by mouth in the morning.     Fluticasone-Umeclidin-Vilant (TRELEGY ELLIPTA) 200-62.5-25 MCG/ACT AEPB Inhale 1 puff into the lungs daily. 60 each 5   Fluticasone-Umeclidin-Vilant (TRELEGY ELLIPTA) 200-62.5-25 MCG/INH AEPB Inhale 1 puff into the lungs daily. 60 each 11   glucose blood (CONTOUR NEXT TEST) test strip Use as instructed 100 each 12   lisinopril (ZESTRIL) 40 MG tablet Take 1 tablet (40 mg total) by mouth daily. 90 tablet 0   metoprolol succinate (TOPROL-XL) 25 MG 24 hr tablet Take 1 tablet (25 mg total) by mouth daily. 90 tablet 0   nystatin (MYCOSTATIN/NYSTOP) powder Apply 1 application topically 3 (three) times daily. (Patient taking differently: Apply 1 application topically in the morning and at bedtime.) 60 g 2   pravastatin (PRAVACHOL) 80 MG tablet Take 1 tablet (80 mg total) by mouth every evening. 90 tablet 3   Current Facility-Administered Medications on File Prior to Visit  Medication Dose Route Frequency Provider Last Rate Last Admin   sodium chloride flush (NS) 0.9 % injection 3 mL  3 mL Intravenous Q12H Dunn, Nedra Hai, PA-C  No Known Allergies  Social History   Socioeconomic History   Marital status: Divorced    Spouse name: Not on file   Number of children: 3   Years of education: Not on file   Highest education level: Not on file  Occupational History   Occupation: retired    Comment: [employer]  Tobacco Use   Smoking status: Every Day    Packs/day: 2.50    Years: 60.00    Pack years: 150.00    Types: Cigarettes   Smokeless tobacco: Never   Tobacco comments:    1 ppd  Vaping Use   Vaping  Use: Never used  Substance and Sexual Activity   Alcohol use: No   Drug use: No   Sexual activity: Not on file  Other Topics Concern   Not on file  Social History Narrative   Marital status: divorced; not dating in 2019 but interested slightly.        Children:  3 children; 7 seven grandchildren; 1 gg      Lives: with oldest daughter, 2 grandchildren, 2 gg, 1 friend of daughters, daughter's boyfriend.       Employment: retired [name] age 76.      Tobacco; 1ppd x 50 years.  Not interested in 2019.      Alcohol: none      Exercise: none in 2019. [place]      ADLs:   independent with ADLs; no assistant devices      Advanced Directives:  None; DNR/DNI; no HCPOA.    Social Determinants of Health   Financial Resource Strain: Not on file  Food Insecurity: Not on file  Transportation Needs: Not on file  Physical Activity: Not on file  Stress: Not on file  Social Connections: Not on file  Intimate Partner Violence: Not on file    Family History  Problem Relation Age of Onset   Diabetes Sister    Diabetes Brother    Hypertension Brother    Cancer Mother 55       colon(age 74) and lung (age 41)   Diabetes Mother    Heart disease Mother 68       CABG   Hyperlipidemia Mother    Cancer Father 9       stomach   Diabetes Maternal Grandmother    Mental illness Sister     Past Surgical History:  Procedure Laterality Date   ABDOMINAL HYSTERECTOMY  2000   fibroids; ovaries intact.   BACK SURGERY     plate put in back   CHOLECYSTECTOMY N/A 05/01/2017   Procedure: LAPAROSCOPIC CHOLECYSTECTOMY WITH INTRAOPERATIVE CHOLANGIOGRAM;  Surgeon: Donnie Mesa, MD;  Location: [facility];  Service: General;  Laterality: N/A;   LEFT HEART CATH AND CORONARY ANGIOGRAPHY N/A 11/18/2020   Procedure: LEFT HEART CATH AND CORONARY ANGIOGRAPHY;  Surgeon: Troy Sine, MD;  Location: [facility];  Service: Cardiovascular;  Laterality: N/A;   RECTAL POLYPECTOMY  05/16/2010    TVAdenoma polyp   TUBAL LIGATION      ROS: Review of Systems Negative except as stated above  PHYSICAL EXAM: BP 132/72 (BP Location: Left Arm, Patient Position: Sitting, Cuff Size: Normal)    Pulse 88    Temp 98.3 F (36.8 C)    Resp 18    Ht 5' 7.01" (1.702 m)    Wt 169 lb (76.7 kg)    SpO2 98%    BMI 26.46 kg/m  Physical Exam HENT:     Head: Normocephalic and atraumatic.  Eyes:     Extraocular Movements: Extraocular movements intact.     Conjunctiva/sclera: Conjunctivae normal.     Pupils: Pupils are equal, round, and reactive to light.  Cardiovascular:     Rate and Rhythm: Normal rate and regular rhythm.     Pulses: Normal pulses.     Heart sounds: Normal heart sounds.  Pulmonary:     Effort: Pulmonary effort is normal.     Breath sounds: Normal breath sounds.  Musculoskeletal:     Cervical back: Normal range of motion and neck supple.  Neurological:     General: No focal deficit present.     Mental Status: She is alert and oriented to person, place, and time.  Psychiatric:        Mood and Affect: Mood normal.        Behavior: Behavior normal.   Results for orders placed or performed in visit on 03/02/21  POCT glycosylated hemoglobin (Hb A1C)  Result Value Ref Range   Hemoglobin A1C 7.6 (A) 4.0 - 5.6 %   HbA1c POC (<> result, manual entry)     HbA1c, POC (prediabetic range)     HbA1c, POC (controlled diabetic range)    . ASSESSMENT AND PLAN: 1. Type 2 diabetes mellitus without complication, with long-term current use of insulin ([place]): - Hemoglobin A1c today at goal at 7.6%, goal < 8%. - Continue Sitagliptin-Metformin and Insulin Glargine as prescribed.  - Discussed the importance of healthy eating habits, low-carbohydrate diet, low-sugar diet, regular aerobic exercise (at least 150 minutes a week as tolerated) and medication compliance to achieve or maintain control of diabetes. - Follow-up with primary provider in 3 months or sooner if needed.  - POCT  glycosylated hemoglobin (Hb A1C) - sitaGLIPtin-metformin (JANUMET) 50-1000 MG tablet; Take 1 tablet by mouth 2 (two) times daily with a meal.  Dispense: 180 tablet; Refill: 0 - insulin glargine (LANTUS SOLOSTAR) 100 UNIT/ML Solostar Pen; Inject 55 Units into the skin at bedtime.  Dispense: 51 mL; Refill: 0 - Insulin Pen Needle (BD PEN NEEDLE NANO U/F) 32G X 4 MM MISC; Inject 1 Dose into the skin daily.  Dispense: 100 each; Refill: 0  2. Encounter for completion of form with patient: - Handicap placard completed today in office.   Patient was given the opportunity to ask questions.  Patient verbalized understanding of the plan and was able to repeat key elements of the plan. Patient was given clear instructions to go to Emergency Department or return to medical center if symptoms don't improve, worsen, or new problems develop.The patient verbalized understanding.   Orders Placed This Encounter  Procedures   POCT glycosylated hemoglobin (Hb A1C)     Requested Prescriptions   Signed Prescriptions Disp Refills   sitaGLIPtin-metformin (JANUMET) 50-1000 MG tablet 180 tablet 0    Sig: Take 1 tablet by mouth 2 (two) times daily with a meal.   insulin glargine (LANTUS SOLOSTAR) 100 UNIT/ML Solostar Pen 51 mL 0    Sig: Inject 55 Units into the skin at bedtime.   Insulin Pen Needle (BD PEN NEEDLE NANO U/F) 32G X 4 MM MISC 100 each 0    Sig: Inject 1 Dose into the skin daily.    Return in about 3 months (around 05/30/2021) for Follow-Up or next available diabetes .  Camillia Herter, NP

## 2021-03-02 ENCOUNTER — Ambulatory Visit (INDEPENDENT_AMBULATORY_CARE_PROVIDER_SITE_OTHER): Payer: Medicare Other | Admitting: Family

## 2021-03-02 ENCOUNTER — Other Ambulatory Visit: Payer: Self-pay

## 2021-03-02 ENCOUNTER — Encounter: Payer: Self-pay | Admitting: Family

## 2021-03-02 VITALS — BP 132/72 | HR 88 | Temp 98.3°F | Resp 18 | Ht 67.01 in | Wt 169.0 lb

## 2021-03-02 DIAGNOSIS — Z0289 Encounter for other administrative examinations: Secondary | ICD-10-CM | POA: Diagnosis not present

## 2021-03-02 DIAGNOSIS — E119 Type 2 diabetes mellitus without complications: Secondary | ICD-10-CM

## 2021-03-02 DIAGNOSIS — Z794 Long term (current) use of insulin: Secondary | ICD-10-CM

## 2021-03-02 LAB — POCT GLYCOSYLATED HEMOGLOBIN (HGB A1C): Hemoglobin A1C: 7.6 % — AB (ref 4.0–5.6)

## 2021-03-02 MED ORDER — BD PEN NEEDLE NANO U/F 32G X 4 MM MISC: 1.0000 | Freq: Every day | 0 refills | Status: DC

## 2021-03-02 MED ORDER — JANUMET 50-1000 MG PO TABS: 1.0000 | ORAL_TABLET | Freq: Two times a day (BID) | ORAL | 0 refills | Status: DC

## 2021-03-02 MED ORDER — LANTUS SOLOSTAR 100 UNIT/ML ~~LOC~~ SOPN: 55.0000 [IU] | PEN_INJECTOR | Freq: Every day | SUBCUTANEOUS | 0 refills | Status: DC

## 2021-03-02 NOTE — Progress Notes (Signed)
Diabetes discussed in office.

## 2021-03-02 NOTE — Progress Notes (Signed)
Pt presents for diabetes follow-up  

## 2021-03-09 ENCOUNTER — Telehealth: Payer: Self-pay | Admitting: Family

## 2021-03-09 NOTE — Telephone Encounter (Signed)
Pt left VM on answering machine requesting medication refills, but did not leave medication names. Please call pt.

## 2021-03-10 ENCOUNTER — Other Ambulatory Visit: Payer: Self-pay

## 2021-03-10 DIAGNOSIS — I10 Essential (primary) hypertension: Secondary | ICD-10-CM

## 2021-03-10 DIAGNOSIS — E119 Type 2 diabetes mellitus without complications: Secondary | ICD-10-CM

## 2021-03-10 MED ORDER — METOPROLOL SUCCINATE ER 25 MG PO TB24: 25.0000 mg | ORAL_TABLET | Freq: Every day | ORAL | 0 refills | Status: DC

## 2021-03-10 MED ORDER — LISINOPRIL 40 MG PO TABS: 40.0000 mg | ORAL_TABLET | Freq: Every day | ORAL | 0 refills | Status: DC

## 2021-03-10 NOTE — Telephone Encounter (Signed)
Spoke w/pt medication request completed

## 2021-04-14 ENCOUNTER — Other Ambulatory Visit: Payer: Self-pay

## 2021-04-14 DIAGNOSIS — I10 Essential (primary) hypertension: Secondary | ICD-10-CM

## 2021-04-14 MED ORDER — AMLODIPINE BESYLATE 5 MG PO TABS: 5.0000 mg | ORAL_TABLET | Freq: Every day | ORAL | 0 refills | Status: DC

## 2021-04-28 ENCOUNTER — Other Ambulatory Visit: Payer: Self-pay | Admitting: Family

## 2021-04-28 DIAGNOSIS — E119 Type 2 diabetes mellitus without complications: Secondary | ICD-10-CM

## 2021-05-04 ENCOUNTER — Other Ambulatory Visit: Payer: Self-pay | Admitting: Family

## 2021-05-04 DIAGNOSIS — E119 Type 2 diabetes mellitus without complications: Secondary | ICD-10-CM

## 2021-05-11 ENCOUNTER — Other Ambulatory Visit: Payer: Self-pay | Admitting: Family

## 2021-05-11 DIAGNOSIS — I10 Essential (primary) hypertension: Secondary | ICD-10-CM

## 2021-05-24 NOTE — Progress Notes (Signed)
? ? ?Patient ID: [name], female    DOB: [birth year]  MRN: [id number] ? ?CC: Diabetes Follow-Up ? ?Subjective: ?[name] is a 76 y.o. female who presents for diabetes follow-up. ? ?Her concerns today include:  ?Diabetes type 2 follow-up: ?03/02/2021: ?- Hemoglobin A1c today at goal at 7.6%, goal < 8%. ?- Continue Sitagliptin-Metformin and Insulin Glargine as prescribed.  ? ?05/30/2021: ?Doing well on current regimen. No side effects. No issues/concerns. Denies acute concerns. Trying to monitor eating Cheeto puffs as she really enjoys them.  ? ?2. Shoulder concern: ?Intermittent right shoulder discomfort for several weeks. Radiating to right arm. Denies any recent injury or trauma. Reports she does cross-stitch as a hobby which may be contributing. Not taking anything for discomfort.  ? ?3. Nosebleeds: ?Followed by ENT. Plans to follow-up soon.  ? ?4. Hypertension: ?Managed per Cardiology.  ? ?Patient Active Problem List  ? Diagnosis Date Noted  ? Diarrhea 05/30/2021  ? Family history of malignant neoplasm of gastrointestinal tract 05/30/2021  ? Fecal urgency 05/30/2021  ? Hemorrhoids 05/30/2021  ? Personal history of colonic polyps 05/30/2021  ? Rectal bleeding 05/30/2021  ? Carotid stenosis 12/01/2020  ? CAD in native artery   ? DOE (dyspnea on exertion)   ? Diastolic dysfunction 22/29/7989  ? Cyst of right ovary 07/28/2017  ? Pure hypercholesterolemia 09/10/2016  ? Thyroid nodule 09/10/2016  ? COPD (chronic obstructive pulmonary disease) ([place]) 09/10/2016  ? Family history of colon cancer 06/06/2016  ? Nuclear sclerotic cataract of both eyes 04/08/2013  ? Type 2 diabetes mellitus without complications ([place]) 21/19/4174  ? Essential hypertension 09/13/2011  ? Tobacco user 09/13/2011  ? DDD (degenerative disc disease), lumbar 09/13/2011  ? History of rectal polypectomy 08/31/2010  ?  ? ?Current Outpatient Medications on File Prior to Visit  ?Medication Sig Dispense Refill  ? Accu-Chek Softclix Lancets lancets USE  UP TO 4 TIMES DAILY AS  DIRECTED 200 each 6  ? acetaminophen (TYLENOL) 500 MG tablet Take 500-1,000 mg by mouth daily as needed (pain).    ? albuterol (VENTOLIN HFA) 108 (90 Base) MCG/ACT inhaler Inhale 2 puffs into the lungs every 6 (six) hours as needed for wheezing or shortness of breath. 3 each 3  ? amLODipine (NORVASC) 5 MG tablet Take 1 tablet (5 mg total) by mouth daily. 90 tablet 0  ? aspirin EC 81 MG tablet Take 81 mg by mouth daily. Swallow whole.    ? blood glucose meter kit and supplies KIT Dispense based on patient and insurance preference. Use up to four times daily as directed. (FOR ICD-9 250.00, 250.01). 1 each 0  ? blood glucose meter kit and supplies Dispense based on insurance preference. Use up to four times daily as directed. (FOR ICD-10 E11.9 E11.65) ?AccuCheck Meter   ?  1 each 0  ? Cholecalciferol (VITAMIN D3) 50 MCG (2000 UT) TABS Take 2,000 Units by mouth in the morning.    ? Fluticasone-Umeclidin-Vilant (TRELEGY ELLIPTA) 200-62.5-25 MCG/ACT AEPB Inhale 1 puff into the lungs daily. 60 each 5  ? glucose blood (CONTOUR NEXT TEST) test strip Use as instructed 100 each 12  ? lisinopril (ZESTRIL) 40 MG tablet TAKE 1 TABLET BY MOUTH DAILY 90 tablet 0  ? metoprolol succinate (TOPROL-XL) 25 MG 24 hr tablet TAKE 1 TABLET BY MOUTH DAILY 90 tablet 0  ? nystatin (MYCOSTATIN/NYSTOP) powder Apply 1 application topically 3 (three) times daily. (Patient taking differently: Apply 1 application topically in the morning and at bedtime.) 60 g 2  ?  pravastatin (PRAVACHOL) 80 MG tablet Take 1 tablet (80 mg total) by mouth every evening. 90 tablet 3  ? ?Current Facility-Administered Medications on File Prior to Visit  ?Medication Dose Route Frequency Provider Last Rate Last Admin  ? sodium chloride flush (NS) 0.9 % injection 3 mL  3 mL Intravenous Q12H Dunn, Dayna N, PA-C      ? ? ?No Known Allergies ? ?Social History  ? ?Socioeconomic History  ? Marital status: Divorced  ?  Spouse name: Not on file  ? Number of  children: 3  ? Years of education: Not on file  ? Highest education level: Not on file  ?Occupational History  ? Occupation: retired  ?  Comment: [employer]  ?Tobacco Use  ? Smoking status: Every Day  ?  Packs/day: 2.50  ?  Years: 60.00  ?  Pack years: 150.00  ?  Types: Cigarettes  ? Smokeless tobacco: Never  ? Tobacco comments:  ?  1 ppd  ?Vaping Use  ? Vaping Use: Never used  ?Substance and Sexual Activity  ? Alcohol use: No  ? Drug use: No  ? Sexual activity: Not on file  ?Other Topics Concern  ? Not on file  ?Social History Narrative  ? Marital status: divorced; not dating in 2019 but interested slightly.    ?    Children:  3 children; 7 seven grandchildren; 1 gg  ?    Lives: with oldest daughter, 2 grandchildren, 80 gg, 1 friend of daughters, daughter's boyfriend.   ?    Employment: retired [employer] age 41.  ?    Tobacco; 1ppd x 20 years.  Not interested in 2019.  ?    Alcohol: none  ?    Exercise: none in 2019. [place]  ?    ADLs:   independent with ADLs; no assistant devices  ?    Advanced Directives:  None; DNR/DNI; no HCPOA.   ? ?Social Determinants of Health  ? ?Financial Resource Strain: Not on file  ?Food Insecurity: Not on file  ?Transportation Needs: Not on file  ?Physical Activity: Not on file  ?Stress: Not on file  ?Social Connections: Not on file  ?Intimate Partner Violence: Not on file  ? ? ?Family History  ?Problem Relation Age of Onset  ? Diabetes Sister   ? Diabetes Brother   ? Hypertension Brother   ? Cancer Mother 27  ?     colon(age 76) and lung (age 12)  ? Diabetes Mother   ? Heart disease Mother 5  ?     CABG  ? Hyperlipidemia Mother   ? Cancer Father 68  ?     stomach  ? Diabetes Maternal Grandmother   ? Mental illness Sister   ? ? ?Past Surgical History:  ?Procedure Laterality Date  ? ABDOMINAL HYSTERECTOMY  2000  ? fibroids; ovaries intact.  ? BACK SURGERY    ? plate put in back  ? CHOLECYSTECTOMY N/A 05/01/2017  ? Procedure: LAPAROSCOPIC CHOLECYSTECTOMY  WITH INTRAOPERATIVE CHOLANGIOGRAM;  Surgeon: Donnie Mesa, MD;  Location: [facility];  Service: General;  Laterality: N/A;  ? LEFT HEART CATH AND CORONARY ANGIOGRAPHY N/A 11/18/2020  ? Procedure: LEFT HEART CATH AND CORONARY ANGIOGRAPHY;  Surgeon: Troy Sine, MD;  Location: [facility];  Service: Cardiovascular;  Laterality: N/A;  ? RECTAL POLYPECTOMY  05/16/2010  ? TVAdenoma polyp  ? TUBAL LIGATION    ? ? ?ROS: ?Review of Systems ?Negative except  as stated above ? ?PHYSICAL EXAM: ?BP 138/82 (BP Location: Left Arm, Patient Position: Sitting, Cuff Size: Large)   Pulse 96   Temp 98.3 ?F (36.8 ?C)   Resp 18   Ht 5' 7.01" (1.702 m)   Wt 168 lb (76.2 kg)   SpO2 98%   BMI 26.31 kg/m?  ? ?Physical Exam ?HENT:  ?   Head: Normocephalic and atraumatic.  ?Eyes:  ?   Extraocular Movements: Extraocular movements intact.  ?   Conjunctiva/sclera: Conjunctivae normal.  ?   Pupils: Pupils are equal, round, and reactive to light.  ?Cardiovascular:  ?   Rate and Rhythm: Normal rate and regular rhythm.  ?   Pulses: Normal pulses.  ?   Heart sounds: Normal heart sounds.  ?Pulmonary:  ?   Effort: Pulmonary effort is normal.  ?   Breath sounds: Normal breath sounds.  ?Musculoskeletal:  ?   Cervical back: Normal range of motion and neck supple.  ?Neurological:  ?   General: No focal deficit present.  ?   Mental Status: She is alert and oriented to person, place, and time.  ?Psychiatric:     ?   Mood and Affect: Mood normal.     ?   Behavior: Behavior normal.  ? ? ?Results for orders placed or performed in visit on 05/30/21  ?POCT glycosylated hemoglobin (Hb A1C)  ?Result Value Ref Range  ? Hemoglobin A1C 8.2 (A) 4.0 - 5.6 %  ? HbA1c POC (<> result, manual entry)    ? HbA1c, POC (prediabetic range)    ? HbA1c, POC (controlled diabetic range)    ? ? ?ASSESSMENT AND PLAN: ?1. Type 2 diabetes mellitus without complication, with long-term current use of insulin ([place]): ?- Hemoglobin A1c today relative to goal at 8.2%, goal 8%.  ?-  Continue Insulin Glargine and Sitagliptin-Metformin as prescribed.  ?- Discussed the importance of healthy eating habits, low-carbohydrate diet, low-sugar diet, regular aerobic exercise (at least 150 minutes

## 2021-05-30 ENCOUNTER — Ambulatory Visit (INDEPENDENT_AMBULATORY_CARE_PROVIDER_SITE_OTHER): Payer: Medicare Other | Admitting: Family

## 2021-05-30 VITALS — BP 138/82 | HR 96 | Temp 98.3°F | Resp 18 | Ht 67.01 in | Wt 168.0 lb

## 2021-05-30 DIAGNOSIS — Z860100 Personal history of colon polyps, unspecified: Secondary | ICD-10-CM | POA: Insufficient documentation

## 2021-05-30 DIAGNOSIS — M25511 Pain in right shoulder: Secondary | ICD-10-CM

## 2021-05-30 DIAGNOSIS — K625 Hemorrhage of anus and rectum: Secondary | ICD-10-CM | POA: Insufficient documentation

## 2021-05-30 DIAGNOSIS — Z794 Long term (current) use of insulin: Secondary | ICD-10-CM

## 2021-05-30 DIAGNOSIS — E119 Type 2 diabetes mellitus without complications: Secondary | ICD-10-CM

## 2021-05-30 DIAGNOSIS — Z800 Family history of malignant neoplasm of digestive organs: Secondary | ICD-10-CM | POA: Insufficient documentation

## 2021-05-30 DIAGNOSIS — I10 Essential (primary) hypertension: Secondary | ICD-10-CM | POA: Diagnosis not present

## 2021-05-30 DIAGNOSIS — K649 Unspecified hemorrhoids: Secondary | ICD-10-CM | POA: Insufficient documentation

## 2021-05-30 DIAGNOSIS — R197 Diarrhea, unspecified: Secondary | ICD-10-CM | POA: Insufficient documentation

## 2021-05-30 DIAGNOSIS — R040 Epistaxis: Secondary | ICD-10-CM | POA: Diagnosis not present

## 2021-05-30 DIAGNOSIS — R152 Fecal urgency: Secondary | ICD-10-CM | POA: Insufficient documentation

## 2021-05-30 LAB — POCT GLYCOSYLATED HEMOGLOBIN (HGB A1C): Hemoglobin A1C: 8.2 % — AB (ref 4.0–5.6)

## 2021-05-30 MED ORDER — LANTUS SOLOSTAR 100 UNIT/ML ~~LOC~~ SOPN: 55.0000 [IU] | PEN_INJECTOR | Freq: Every day | SUBCUTANEOUS | 0 refills | Status: DC

## 2021-05-30 MED ORDER — BD PEN NEEDLE NANO U/F 32G X 4 MM MISC: 1.0000 | Freq: Every day | 0 refills | Status: DC

## 2021-05-30 MED ORDER — DICLOFENAC SODIUM 1 % EX GEL: 2.0000 g | Freq: Every day | CUTANEOUS | 0 refills | Status: DC

## 2021-05-30 MED ORDER — JANUMET 50-1000 MG PO TABS: 1.0000 | ORAL_TABLET | Freq: Two times a day (BID) | ORAL | 0 refills | Status: DC

## 2021-05-30 NOTE — Progress Notes (Signed)
Diabetes discussed in office.

## 2021-05-30 NOTE — Progress Notes (Signed)
Pt presents for diabetes f/u ?Having complaints of right shoulder pain and pins feeling right arm ?Pt had nosebleed this morning  ?Lantus, Janumet, and Amlodipine needs refill  ?

## 2021-06-01 ENCOUNTER — Ambulatory Visit: Payer: Self-pay

## 2021-06-01 NOTE — Telephone Encounter (Signed)
Pt called, advised her the voltaren gel was for the shoulder pain that she discussed during her appt on 05/30/21. Pt states she wasn't sure but wanted to make sure and didn't know that Amy, NP was going to send into pharmacy. No further questions or concerns.   Summary: medication Advice   Pt is calling because she received a medication diclofenac Sodium (VOLTAREN) 1 % GEL [[id number]] in the mail. Pt states that it was discussed in the ov about the medication. Please advise      Reason for Disposition  Caller has medicine question only, adult not sick, AND triager answers question  Answer Assessment - Initial Assessment Questions 1. NAME of MEDICATION: "What medicine are you calling about?"     Voltaren gel 2. QUESTION: "What is your question?" (e.g., double dose of medicine, side effect)     Wanted to make sure this was for shoulder pain 3. PRESCRIBING HCP: "Who prescribed it?" Reason: if prescribed by specialist, call should be referred to that group.     Durene Fruits, NP  Protocols used: Medication Question Call-A-AH

## 2021-06-16 ENCOUNTER — Other Ambulatory Visit: Payer: Self-pay | Admitting: Family

## 2021-06-16 DIAGNOSIS — I10 Essential (primary) hypertension: Secondary | ICD-10-CM

## 2021-06-19 ENCOUNTER — Other Ambulatory Visit: Payer: Self-pay | Admitting: Family

## 2021-06-19 DIAGNOSIS — I10 Essential (primary) hypertension: Secondary | ICD-10-CM

## 2021-06-19 NOTE — Telephone Encounter (Signed)
Refilled per patient request. 

## 2021-06-30 LAB — HM DIABETES EYE EXAM

## 2021-07-03 ENCOUNTER — Other Ambulatory Visit: Payer: Self-pay | Admitting: Family

## 2021-07-03 DIAGNOSIS — I10 Essential (primary) hypertension: Secondary | ICD-10-CM

## 2021-07-04 ENCOUNTER — Other Ambulatory Visit: Payer: Self-pay | Admitting: Family

## 2021-07-04 DIAGNOSIS — I10 Essential (primary) hypertension: Secondary | ICD-10-CM

## 2021-07-04 NOTE — Telephone Encounter (Unsigned)
Copied from [place] [id number]. Topic: General - Other >> Jul 04, 2021  2:39 PM Everette C wrote: Reason for CRM: Medication Refill - Medication: amLODipine (NORVASC) 5 MG tablet [[id number]]  Has the patient contacted their pharmacy? Yes.  The patient has previously attempted to request their refill through mychart as well  (Agent: If no, request that the patient contact the pharmacy for the refill. If patient does not wish to contact the pharmacy document the reason why and proceed with request.) (Agent: If yes, when and what did the pharmacy advise?)  Preferred Pharmacy (with phone number or street name): [facility] ([facility] ) - [address], [address] [place] [address] Phone: [phone] Fax: [phone] Hours: Not open 24 hours   Has the patient been seen for an appointment in the last year OR does the patient have an upcoming appointment? Yes.    Agent: Please be advised that RX refills may take up to 3 business days. We ask that you follow-up with your pharmacy.

## 2021-07-05 MED ORDER — AMLODIPINE BESYLATE 5 MG PO TABS: 5.0000 mg | ORAL_TABLET | Freq: Every day | ORAL | 0 refills | Status: DC

## 2021-07-05 NOTE — Telephone Encounter (Signed)
Called [facility] to clarify and they did not receive rx for Norvasc. Will update pt and it has been requested now.

## 2021-07-05 NOTE — Telephone Encounter (Signed)
Pharm had not received. Resending. Requested Prescriptions  Pending Prescriptions Disp Refills  . amLODipine (NORVASC) 5 MG tablet 90 tablet 0    Sig: Take 1 tablet (5 mg total) by mouth daily.     Cardiovascular: Calcium Channel Blockers 2 Passed - 07/04/2021  3:09 PM      Passed - Last BP in normal range    BP Readings from Last 1 Encounters:  05/30/21 138/82         Passed - Last Heart Rate in normal range    Pulse Readings from Last 1 Encounters:  05/30/21 96         Passed - Valid encounter within last 6 months    Recent Outpatient Visits          1 month ago Type 2 diabetes mellitus without complication, with long-term current use of insulin ([place])   Primary Care at [facility], Amy J, NP   4 months ago Type 2 diabetes mellitus without complication, with long-term current use of insulin [facility])   Primary Care at [facility], Amy J, NP   7 months ago Type 2 diabetes mellitus without complication, with long-term current use of insulin [facility])   Primary Care at [facility], Amy J, NP   10 months ago Type 2 diabetes mellitus without complication, with long-term current use of insulin [facility])   Primary Care at [facility], Flonnie Hailstone, NP   11 months ago Medicare annual wellness visit, subsequent   Primary Care at [facility], Flonnie Hailstone, NP      Future Appointments            In 2 months Camillia Herter, NP Primary Care at [facility]

## 2021-07-05 NOTE — Telephone Encounter (Signed)
Updated pt that [facility] now has rx and will send meds, she stated she is not out yet.

## 2021-07-05 NOTE — Telephone Encounter (Signed)
Just asking pt if they did not receive Norvasc from mail order. It was ordered 06/20/21 and pt has requested it several times in the past week. Message left to return call.

## 2021-07-08 ENCOUNTER — Other Ambulatory Visit: Payer: Self-pay | Admitting: Pulmonary Disease

## 2021-07-13 ENCOUNTER — Encounter: Payer: Self-pay | Admitting: Family

## 2021-07-28 ENCOUNTER — Other Ambulatory Visit: Payer: Self-pay | Admitting: Family

## 2021-07-28 DIAGNOSIS — I10 Essential (primary) hypertension: Secondary | ICD-10-CM

## 2021-07-28 DIAGNOSIS — E119 Type 2 diabetes mellitus without complications: Secondary | ICD-10-CM

## 2021-07-30 ENCOUNTER — Other Ambulatory Visit: Payer: Self-pay | Admitting: Family

## 2021-07-30 DIAGNOSIS — I10 Essential (primary) hypertension: Secondary | ICD-10-CM

## 2021-07-30 DIAGNOSIS — E119 Type 2 diabetes mellitus without complications: Secondary | ICD-10-CM

## 2021-07-30 MED ORDER — METOPROLOL SUCCINATE ER 25 MG PO TB24
25.0000 mg | ORAL_TABLET | Freq: Every day | ORAL | 0 refills | Status: DC
Start: 2021-07-30 — End: 2021-09-20

## 2021-07-30 MED ORDER — ACCU-CHEK SOFTCLIX LANCETS MISC: 6 refills | Status: AC

## 2021-07-30 MED ORDER — LISINOPRIL 40 MG PO TABS: 40.0000 mg | ORAL_TABLET | Freq: Every day | ORAL | 0 refills | Status: DC

## 2021-07-30 NOTE — Telephone Encounter (Signed)
-   Amlodipine refilled 07/05/2021 for 90 day supply.  - Lantus refilled 05/30/2021 for 120 day supply.  - Metoprolol refilled as requested.  - Lisinopril refilled as requested.  - Accu-Check Softclix Lancets refilled as requested.

## 2021-07-31 ENCOUNTER — Other Ambulatory Visit: Payer: Self-pay | Admitting: Family

## 2021-07-31 DIAGNOSIS — Z794 Long term (current) use of insulin: Secondary | ICD-10-CM

## 2021-07-31 NOTE — Telephone Encounter (Signed)
Janumet refilled 05/30/2021 for 120 day supply. Call patient to confirm necessity of early refill request.

## 2021-08-04 ENCOUNTER — Other Ambulatory Visit: Payer: Self-pay | Admitting: Family

## 2021-08-04 DIAGNOSIS — Z1231 Encounter for screening mammogram for malignant neoplasm of breast: Secondary | ICD-10-CM

## 2021-08-25 ENCOUNTER — Ambulatory Visit
Admission: RE | Admit: 2021-08-25 | Discharge: 2021-08-25 | Disposition: A | Discharge status: Home / Self Care | Payer: Medicare Other | Source: Ambulatory Visit | Attending: Family | Admitting: Family

## 2021-08-25 DIAGNOSIS — Z1231 Encounter for screening mammogram for malignant neoplasm of breast: Secondary | ICD-10-CM

## 2021-08-26 ENCOUNTER — Other Ambulatory Visit: Payer: Self-pay | Admitting: Family

## 2021-08-26 DIAGNOSIS — E119 Type 2 diabetes mellitus without complications: Secondary | ICD-10-CM

## 2021-08-31 ENCOUNTER — Telehealth: Payer: Self-pay

## 2021-08-31 NOTE — Telephone Encounter (Signed)
Spoke to pt states that lancet pen has broken and needs new one advised pt that I was unable to order separately and send to pharmacy, pt states she will contact her pharmacy and insurance company to see how she can resolve issue

## 2021-09-15 ENCOUNTER — Other Ambulatory Visit: Payer: Self-pay | Admitting: Family

## 2021-09-15 DIAGNOSIS — I10 Essential (primary) hypertension: Secondary | ICD-10-CM

## 2021-09-19 NOTE — Telephone Encounter (Signed)
Requested medication (s) are due for refill today: No  Requested medication (s) are on the active medication list: Yes  Last refill:  08/03/21  Future visit scheduled: Yes  Notes to clinic:  Pharmacy requests a year supply.    Requested Prescriptions  Pending Prescriptions Disp Refills   metoprolol succinate (TOPROL-XL) 25 MG 24 hr tablet [Pharmacy Med Name: Metoprolol Succinate ER 25 MG Oral Tablet Extended Release 24 Hour] 90 tablet 3    Sig: TAKE 1 TABLET BY MOUTH DAILY     Cardiovascular:  Beta Blockers Passed - 09/15/2021 11:02 PM      Passed - Last BP in normal range    BP Readings from Last 1 Encounters:  05/30/21 138/82         Passed - Last Heart Rate in normal range    Pulse Readings from Last 1 Encounters:  05/30/21 96         Passed - Valid encounter within last 6 months    Recent Outpatient Visits           3 months ago Type 2 diabetes mellitus without complication, with long-term current use of insulin ([place])   Primary Care at [facility], Amy J, NP   6 months ago Type 2 diabetes mellitus without complication, with long-term current use of insulin [facility])   Primary Care at [facility], Amy J, NP   9 months ago Type 2 diabetes mellitus without complication, with long-term current use of insulin [facility])   Primary Care at [facility], Amy J, NP   1 year ago Type 2 diabetes mellitus without complication, with long-term current use of insulin [facility])   Primary Care at [facility] [place], NP   1 year ago Medicare annual wellness visit, subsequent   Primary Care at [facility], Amy J, NP       Future Appointments             In 1 week Camillia Herter, NP Primary Care at [facility]             lisinopril (ZESTRIL) 40 MG tablet [Pharmacy Med Name: Lisinopril 40 MG Oral Tablet] 90 tablet 3    Sig: TAKE 1 TABLET BY MOUTH DAILY     Cardiovascular:  ACE Inhibitors Failed - 09/15/2021 11:02 PM      Failed  - Cr in normal range and within 180 days    Creat  Date Value Ref Range Status  08/31/2015 1.14 (H) 0.60 - 0.93 mg/dL Final    Comment:      For patients > or = 77 years of age: The upper reference limit for Creatinine is approximately 13% higher for people identified as African-American.      Creatinine, Ser  Date Value Ref Range Status  11/16/2020 0.92 0.57 - 1.00 mg/dL Final         Failed - K in normal range and within 180 days    Potassium  Date Value Ref Range Status  11/16/2020 5.1 3.5 - 5.2 mmol/L Final         Passed - Patient is not pregnant      Passed - Last BP in normal range    BP Readings from Last 1 Encounters:  05/30/21 138/82         Passed - Valid encounter within last 6 months    Recent Outpatient Visits           3 months ago  Type 2 diabetes mellitus without complication, with long-term current use of insulin [facility])   Primary Care at [facility], Amy J, NP   6 months ago Type 2 diabetes mellitus without complication, with long-term current use of insulin [facility])   Primary Care at [facility], Amy J, NP   9 months ago Type 2 diabetes mellitus without complication, with long-term current use of insulin [facility])   Primary Care at [facility], Amy J, NP   1 year ago Type 2 diabetes mellitus without complication, with long-term current use of insulin [facility])   Primary Care at [facility], [place], NP   1 year ago Medicare annual wellness visit, subsequent   Primary Care at [facility], Flonnie Hailstone, NP       Future Appointments             In 1 week Camillia Herter, NP Primary Care at [facility]

## 2021-09-20 NOTE — Progress Notes (Signed)
  Patient ID: [name], female    DOB: 10/16/1945  MRN: [id number]  CC: Chronic Care Management   Subjective: [name] is a 75 y.o. female who presents for chronic care management.   Her concerns today include:  Doing well on Janumet and Lantus without issues or concerns.    Patient Active Problem List   Diagnosis Date Noted   Diarrhea 05/30/2021   Family history of malignant neoplasm of gastrointestinal tract 05/30/2021   Fecal urgency 05/30/2021   Hemorrhoids 05/30/2021   Personal history of colonic polyps 05/30/2021   Rectal bleeding 05/30/2021   Carotid stenosis 12/01/2020   CAD in native artery    DOE (dyspnea on exertion)    Diastolic dysfunction 07/22/2020   Cyst of right ovary 07/28/2017   Pure hypercholesterolemia 09/10/2016   Thyroid nodule 09/10/2016   COPD (chronic obstructive pulmonary disease) (HCC) 09/10/2016   Family history of colon cancer 06/06/2016   Nuclear sclerotic cataract of both eyes 04/08/2013   Type 2 diabetes mellitus without complications (HCC) 09/13/2011   Essential hypertension 09/13/2011   Tobacco user 09/13/2011   DDD (degenerative disc disease), lumbar 09/13/2011   History of rectal polypectomy 08/31/2010     Current Outpatient Medications on File Prior to Visit  Medication Sig Dispense Refill   ACCU-CHEK GUIDE test strip USE AS DIRECTED 400 strip 3   Accu-Chek Softclix Lancets lancets USE UP TO 4 TIMES DAILY AS  DIRECTED 200 each 6   acetaminophen (TYLENOL) 500 MG tablet Take 500-1,000 mg by mouth daily as needed (pain).     albuterol (VENTOLIN HFA) 108 (90 Base) MCG/ACT inhaler Inhale 2 puffs into the lungs every 6 (six) hours as needed for wheezing or shortness of breath. 3 each 3   amLODipine (NORVASC) 5 MG tablet TAKE 1 TABLET BY MOUTH DAILY 90 tablet 0   aspirin EC 81 MG tablet Take 81 mg by mouth daily. Swallow whole.     blood glucose meter kit and supplies KIT Dispense based on patient and insurance preference. Use up to  four times daily as directed. (FOR ICD-9 250.00, 250.01). 1 each 0   blood glucose meter kit and supplies Dispense based on insurance preference. Use up to four times daily as directed. (FOR ICD-10 E11.9 E11.65) AccuCheck Meter     1 each 0   Cholecalciferol (VITAMIN D3) 50 MCG (2000 UT) TABS Take 2,000 Units by mouth in the morning.     diclofenac Sodium (VOLTAREN) 1 % GEL Apply 2 g topically at bedtime. 100 g 0   Insulin Pen Needle (BD PEN NEEDLE NANO U/F) 32G X 4 MM MISC Inject 1 Dose into the skin daily. 100 each 0   LANTUS SOLOSTAR 100 UNIT/ML Solostar Pen INJECT SUBCUTANEOUSLY 55 UNITS  EVERY NIGHT AT BEDTIME 60 mL 3   lisinopril (ZESTRIL) 40 MG tablet TAKE 1 TABLET BY MOUTH DAILY 90 tablet 0   metoprolol succinate (TOPROL-XL) 25 MG 24 hr tablet TAKE 1 TABLET BY MOUTH ONCE  DAILY 90 tablet 0   nystatin (MYCOSTATIN/NYSTOP) powder Apply 1 application topically 3 (three) times daily. (Patient taking differently: Apply 1 application topically in the morning and at bedtime.) 60 g 2   TRELEGY ELLIPTA 200-62.5-25 MCG/ACT AEPB USE 1 INHALATION BY MOUTH DAILY 180 each 3   Current Facility-Administered Medications on File Prior to Visit  Medication Dose Route Frequency Provider Last Rate Last Admin   sodium chloride flush (NS) 0.9 % injection 3 mL  3 mL Intravenous Q12H   Dunn, Nedra Hai, PA-C        No Known Allergies  Social History   Socioeconomic History   Marital status: Divorced    Spouse name: Not on file   Number of children: 3   Years of education: Not on file   Highest education level: Not on file  Occupational History   Occupation: retired    Comment: [employer] Use   Smoking status: Every Day    Packs/day: 2.50    Years: 60.00    Total pack years: 150.00    Types: Cigarettes    Passive exposure: Current   Smokeless tobacco: Never   Tobacco comments:    1 ppd  Vaping Use   Vaping Use: Never used  Substance and Sexual Activity   Alcohol use: No   Drug  use: No   Sexual activity: Not on file  Other Topics Concern   Not on file  Social History Narrative   Marital status: divorced; not dating in 2019 but interested slightly.        Children:  3 children; 7 seven grandchildren; 1 gg      Lives: with oldest daughter, 2 grandchildren, 32 gg, 1 friend of daughters, daughter's boyfriend.       Employment: retired [employer] age 89.      Tobacco; 1ppd x 50 years.  Not interested in 2019.      Alcohol: none      Exercise: none in 2019[place]      ADLs:   independent with ADLs; no assistant devices      Advanced Directives:  None; DNR/DNI; no HCPOA.    Social Determinants of Health   Financial Resource Strain: Not on file  Food Insecurity: Not on file  Transportation Needs: Not on file  Physical Activity: Not on file  Stress: Not on file  Social Connections: Not on file  Intimate Partner Violence: Not on file    Family History  Problem Relation Age of Onset   Diabetes Sister    Diabetes Brother    Hypertension Brother    Cancer Mother 38       colon(age 41) and lung (age 78)   Diabetes Mother    Heart disease Mother 18       CABG   Hyperlipidemia Mother    Cancer Father 71       stomach   Diabetes Maternal Grandmother    Mental illness Sister     Past Surgical History:  Procedure Laterality Date   ABDOMINAL HYSTERECTOMY  2000   fibroids; ovaries intact.   BACK SURGERY     plate put in back   CHOLECYSTECTOMY N/A 05/01/2017   Procedure: LAPAROSCOPIC CHOLECYSTECTOMY WITH INTRAOPERATIVE CHOLANGIOGRAM;  Surgeon: Donnie Mesa, MD;  Location: [facility];  Service: General;  Laterality: N/A;   LEFT HEART CATH AND CORONARY ANGIOGRAPHY N/A 11/18/2020   Procedure: LEFT HEART CATH AND CORONARY ANGIOGRAPHY;  Surgeon: Troy Sine, MD;  Location: [facility];  Service: Cardiovascular;  Laterality: N/A;   RECTAL POLYPECTOMY  05/16/2010   TVAdenoma polyp   TUBAL LIGATION      ROS: Review of  Systems Negative except as stated above  PHYSICAL EXAM: BP (!) 148/74 (BP Location: Right Arm, Patient Position: Sitting, Cuff Size: Large)   Pulse 79   Temp 98.3 F (36.8 C)   Resp 16   Ht 5' 7.01" (1.702 m)   Wt 170 lb (77.1  kg)   SpO2 98%   BMI 26.62 kg/m   Physical Exam HENT:     Head: Normocephalic and atraumatic.  Eyes:     Extraocular Movements: Extraocular movements intact.     Conjunctiva/sclera: Conjunctivae normal.     Pupils: Pupils are equal, round, and reactive to light.  Cardiovascular:     Rate and Rhythm: Normal rate and regular rhythm.     Pulses: Normal pulses.     Heart sounds: Normal heart sounds.  Pulmonary:     Effort: Pulmonary effort is normal.     Breath sounds: Normal breath sounds.  Musculoskeletal:     Cervical back: Normal range of motion and neck supple.  Neurological:     General: No focal deficit present.     Mental Status: She is alert and oriented to person, place, and time.  Psychiatric:        Mood and Affect: Mood normal.        Behavior: Behavior normal.    ASSESSMENT AND PLAN: 1. Type 2 diabetes mellitus without complication, with long-term current use of insulin (HCC) - Continue Sitagliptin-Metformin and Insulin Glargine as prescribed. - Discussed the importance of healthy eating habits, low-carbohydrate diet, low-sugar diet, regular aerobic exercise (at least 150 minutes a week as tolerated) and medication compliance to achieve or maintain control of diabetes. - Sending hemoglobin A1c to lab, result pending. - Follow-up with primary provider in 3 months or sooner if needed.  - Hemoglobin A1c - sitaGLIPtin-metformin (JANUMET) 50-1000 MG tablet; Take 1 tablet by mouth 2 (two) times daily with a meal.  Dispense: 60 tablet; Refill: 2 - insulin glargine (LANTUS SOLOSTAR) 100 UNIT/ML Solostar Pen; INJECT SUBCUTANEOUSLY 55 UNITS  EVERY NIGHT AT BEDTIME  Dispense: 15 mL; Refill: 2  2. Need for immunization against influenza -  Administered.  - Flu Vaccine QUAD 3moIM (Fluarix, Fluzone & Alfiuria Quad PF)   Patient was given the opportunity to ask questions.  Patient verbalized understanding of the plan and was able to repeat key elements of the plan. Patient was given clear instructions to go to Emergency Department or return to medical center if symptoms don't improve, worsen, or new problems develop.The patient verbalized understanding.   Orders Placed This Encounter  Procedures   Flu Vaccine QUAD 628moM (Fluarix, Fluzone & Alfiuria Quad PF)   Hemoglobin A1c     Requested Prescriptions   Signed Prescriptions Disp Refills   sitaGLIPtin-metformin (JANUMET) 50-1000 MG tablet 60 tablet 2    Sig: Take 1 tablet by mouth 2 (two) times daily with a meal.    Return in about 3 months (around 12/28/2021) for Follow-Up or next available chronic care mgmt.  AmCamillia HerterNP

## 2021-09-28 ENCOUNTER — Ambulatory Visit (INDEPENDENT_AMBULATORY_CARE_PROVIDER_SITE_OTHER): Payer: Medicare Other | Admitting: Family

## 2021-09-28 ENCOUNTER — Encounter: Payer: Self-pay | Admitting: Family

## 2021-09-28 ENCOUNTER — Other Ambulatory Visit: Payer: Self-pay | Admitting: Cardiology

## 2021-09-28 VITALS — BP 148/74 | HR 79 | Temp 98.3°F | Resp 16 | Ht 67.01 in | Wt 170.0 lb

## 2021-09-28 DIAGNOSIS — Z794 Long term (current) use of insulin: Secondary | ICD-10-CM | POA: Diagnosis not present

## 2021-09-28 DIAGNOSIS — E119 Type 2 diabetes mellitus without complications: Secondary | ICD-10-CM | POA: Diagnosis not present

## 2021-09-28 DIAGNOSIS — Z23 Encounter for immunization: Secondary | ICD-10-CM

## 2021-09-28 MED ORDER — LANTUS SOLOSTAR 100 UNIT/ML ~~LOC~~ SOPN: PEN_INJECTOR | SUBCUTANEOUS | 2 refills | Status: DC

## 2021-09-28 MED ORDER — JANUMET 50-1000 MG PO TABS: 1.0000 | ORAL_TABLET | Freq: Two times a day (BID) | ORAL | 2 refills | Status: DC

## 2021-09-28 NOTE — Progress Notes (Signed)
.  Pt presents for chronic care management   

## 2021-09-28 NOTE — Patient Instructions (Signed)
Sitagliptin; Metformin Tablets What is this medication? SITAGLIPTIN; METFORMIN (sit a GLIP tin; met FOR min) treats type 2 diabetes. It works by decreasing your blood sugar (glucose). Changes to diet and exercise are often combined with this medication. This medicine may be used for other purposes; ask your health care provider or pharmacist if you have questions. COMMON BRAND NAME(S): Janumet What should I tell my care team before I take this medication? They need to know if you have any of these conditions: Anemia Dehydration Gallbladder disease Heart disease Heart failure High levels of triglycerides in the blood History of diabetic ketoacidosis If you often drink alcohol Kidney disease Liver disease Low levels of vitamin B12 in the blood Pancreatitis Polycystic ovary syndrome Previous swelling of the tongue, face, or lips with difficulty breathing, difficulty swallowing, hoarseness, or tightening of the throat Serious infection or injury Thyroid disease Type 1 diabetes An unusual or allergic reaction to metformin, sitagliptin, other medications, foods, dyes, or preservatives Pregnant or trying to get pregnant Breast-feeding How should I use this medication? Take this medication by mouth with a glass of water. Follow the directions on the prescription label. Take this medication with food. Take your doses at regular intervals. Do not take your medication more often than directed. Do not stop taking except on your care team's advice. A special MedGuide will be given to you by the pharmacist with each prescription and refill. Be sure to read this information carefully each time. Talk to your care team about the use of this medication in children. It is not approved for use in children. Overdosage: If you think you have taken too much of this medicine contact a poison control center or emergency room at once. NOTE: This medicine is only for you. Do not share this medicine with  others. What if I miss a dose? If you miss a dose, take it as soon as you can. If it is almost time for your next dose, take only that dose. Do not take double or extra doses. What may interact with this medication? Do not take this medication with any of the following medications: Certain contrast medications given before X-rays, CT scans, MRI, or other procedures Dofetilide This medication may also interact with the following medications: Acetazolamide Alcohol Certain antivirals for HIV or hepatitis Certain medications for blood pressure, heart disease, irregular heart beat Cimetidine Dichlorphenamide Digoxin Diuretics Female hormones, like estrogens or progestins and birth control pills Glycopyrrolate Isoniazid Lamotrigine Memantine Methazolamide Metoclopramide Midodrine Niacin Phenothiazines like chlorpromazine, mesoridazine, prochlorperazine, thioridazine Phenytoin Ranolazine Steroid medications like prednisone or cortisone Stimulant medications for attention disorders, weight loss, or to stay awake Thyroid medications Topiramate Trospium Vandetanib Zonisamide This list may not describe all possible interactions. Give your health care provider a list of all the medicines, herbs, non-prescription drugs, or dietary supplements you use. Also tell them if you smoke, drink alcohol, or use illegal drugs. Some items may interact with your medicine. What should I watch for while using this medication? Visit your care team for regular checks on your progress. A test called the HbA1C (A1C) will be monitored. This is a simple blood test. It measures your blood sugar control over the last 2 to 3 months. You will receive this test every 3 to 6 months. Learn how to check your blood sugar. Learn the symptoms of high or low blood sugar and how to manage them. Always carry a quick-source of sugar with you in case you have symptoms of low blood sugar. Examples  include hard sugar candy or  glucose tablets. Make sure others know that you can choke if you eat or drink when you develop serious symptoms of low blood sugar, such as seizures or unconsciousness. They must get medical help at once. Tell your care team if you have high blood sugar. You might need to change the dose of your medication. If you are sick or exercising more than usual, you might need to change the dose of your medication. Do not skip meals. Ask your care team if you should avoid alcohol. Many nonprescription cough and cold products contain sugar or alcohol. These can affect blood sugar. This medication may cause ovulation in premenopausal women who do not have regular monthly periods. This may increase your chances of becoming pregnant. You should not take this medication if you become pregnant or think you may be pregnant. Talk with your care team about your birth control options while taking this medication. Contact your care team right away if you think you are pregnant. If you are going to need surgery, an MRI, CT scan, or other procedure, tell your care team that you are taking this medication. You may need to stop taking this medication before the procedure. Wear a medical ID bracelet or chain, and carry a card that describes your disease and details of your medication and dosage times. This medication may cause a decrease in folic acid and vitamin B12. You should make sure that you get enough vitamins while you are taking this medication. Discuss the foods you eat and the vitamins you take with your care team. What side effects may I notice from receiving this medication? Side effects that you should report to your care team as soon as possible: Allergic reactions--skin rash, itching, hives, swelling of the face, lips, tongue, or throat Heart failure--shortness of breath, swelling of the ankles, feet, or hands, sudden weight gain, unusual weakness or fatigue High lactic acid level--muscle pain or cramps, stomach  pain, trouble breathing, general discomfort and fatigue Kidney injury--decrease in the amount of urine, swelling of the ankles, hands, or feet Low vitamin B12 level--pain, tingling, or numbness in the hands or feet, muscle weakness, dizziness, confusion, trouble concentrating Pancreatitis--severe stomach pain that spreads to your back or gets worse after eating or when touched, fever, nausea, vomiting Redness, blistering, peeling, or loosening of the skin, including inside the mouth Severe joint pain Side effects that usually do not require medical attention (report to your care team if they continue or are bothersome): Diarrhea Gas Headache Nausea Runny or stuffy nose Sore throat Upset stomach This list may not describe all possible side effects. Call your doctor for medical advice about side effects. You may report side effects to FDA at 1-800-FDA-1088. Where should I keep my medication? Keep out of the reach of children and pets. Store at room temperature between 15 and 30 degrees C (59 and 86 degrees F). Throw away any unused medication after the expiration date. NOTE: This sheet is a summary. It may not cover all possible information. If you have questions about this medicine, talk to your doctor, pharmacist, or health care provider.  2023 Elsevier/Gold Standard (2020-08-12 00:00:00)

## 2021-09-29 ENCOUNTER — Ambulatory Visit: Payer: Self-pay

## 2021-09-29 ENCOUNTER — Other Ambulatory Visit: Payer: Self-pay | Admitting: Family

## 2021-09-29 DIAGNOSIS — E119 Type 2 diabetes mellitus without complications: Secondary | ICD-10-CM

## 2021-09-29 LAB — HEMOGLOBIN A1C
Est. average glucose Bld gHb Est-mCnc: 226 mg/dL
Hgb A1c MFr Bld: 9.5 % — ABNORMAL HIGH (ref 4.8–5.6)

## 2021-09-29 MED ORDER — DULAGLUTIDE 0.75 MG/0.5ML ~~LOC~~ SOAJ: 0.7500 mg | SUBCUTANEOUS | 0 refills | Status: DC

## 2021-09-29 NOTE — Telephone Encounter (Signed)
  Chief Complaint: Medication question Symptoms:  Frequency:  Pertinent Negatives: Patient denies  Disposition: '[]'$ ED /'[]'$ Urgent Care (no appt availability in office) / '[]'$ Appointment(In office/virtual)/ '[]'$  [facility]/ '[]'$ Home Care/ '[]'$ Refused Recommended Disposition /'[]'$ [facility]/ '[x]'$  Follow-up with PCP Additional Notes: Pt was seen at the office yesterday. Per visit notes and pt a new medication was not discussed . Dulaglutide was prescribed by Durene Fruits.  Pt was called by pharmacy to pick up the medication Pt would like to know why this medication was prescribed, and why it was not discussed at appointment.  Please advise.    Summary: Medication Advice   Pt is calling to report that she was in the office yesterday. Amy did not report to the patient that a medication was going to be called in. Pt would like to know what is Dulaglutide 0.75 MG/0.5ML SOPN [[id number]] for? Please advise      Reason for Disposition  [1] Caller has NON-URGENT medicine question about med that PCP prescribed AND [2] triager unable to answer question  Answer Assessment - Initial Assessment Questions 1. NAME of MEDICINE: "What medicine(s) are you calling about?"     Dulaglutide 2. QUESTION: "What is your question?" (e.g., double dose of medicine, side effect)     Why was this medication prescribed  - not discussed at [facility] yesterday 3. PRESCRIBER: "Who prescribed the medicine?" Reason: if prescribed by specialist, call should be referred to that group.     Amy [name] 4. SYMPTOMS: "Do you have any symptoms?" If Yes, ask: "What symptoms are you having?"  "How bad are the symptoms (e.g., mild, moderate, severe)      5. PREGNANCY:  "Is there any chance that you are pregnant?" "When was your last menstrual period?"  Protocols used: Medication Question Call-A-AH

## 2021-09-29 NOTE — Telephone Encounter (Signed)
Pt is calling to report that she was in the office yesterday. Amy did not report to the patient that a medication was going to be called in. Pt would like to know what is Dulaglutide 0.75 MG/0.5ML SOPN [[id number]] for? Please advise   Left message to call back.

## 2021-09-29 NOTE — Telephone Encounter (Signed)
Spoke to pt advised that medication was prescribed due to increase in A1c numbers. Pt has been advised

## 2021-10-18 NOTE — Progress Notes (Signed)
Patient ID: [name], female    DOB: [birth year]  MRN: [id number]  CC: Diabetes Follow-Up  Subjective: [name] is a 76 y.o. female who presents for diabetes follow-up.   Her concerns today include:  Low blood sugars without significant symptoms since beginning Trulicity. Recalls one morning blood sugar 58. After eating breakfast blood sugar went to 85. States sometimes around lunchtime has noticed low blood sugars. She takes Trulicity weekly on Mondays around lunchtime. Doing well on Janumet and Lantus without issues or concerns. States she is no longer eating unhealthy snacks especially cheese puffs which are her favorite.  Patient Active Problem List   Diagnosis Date Noted   Diarrhea 05/30/2021   Family history of malignant neoplasm of gastrointestinal tract 05/30/2021   Fecal urgency 05/30/2021   Hemorrhoids 05/30/2021   Personal history of colonic polyps 05/30/2021   Rectal bleeding 05/30/2021   Carotid stenosis 12/01/2020   CAD in native artery    DOE (dyspnea on exertion)    Diastolic dysfunction 57/01/7791   Cyst of right ovary 07/28/2017   Pure hypercholesterolemia 09/10/2016   Thyroid nodule 09/10/2016   COPD (chronic obstructive pulmonary disease) ([name]) 09/10/2016   Family history of colon cancer 06/06/2016   Nuclear sclerotic cataract of both eyes 04/08/2013   Type 2 diabetes mellitus without complications ([name]) 90/30/0923   Essential hypertension 09/13/2011   Tobacco user 09/13/2011   DDD (degenerative disc disease), lumbar 09/13/2011   History of rectal polypectomy 08/31/2010     Current Outpatient Medications on File Prior to Visit  Medication Sig Dispense Refill   ACCU-CHEK GUIDE test strip USE AS DIRECTED 400 strip 3   Accu-Chek Softclix Lancets lancets USE UP TO 4 TIMES DAILY AS  DIRECTED 200 each 6   acetaminophen (TYLENOL) 500 MG tablet Take 500-1,000 mg by mouth daily as needed (pain).     albuterol (VENTOLIN HFA) 108 (90 Base) MCG/ACT inhaler  Inhale 2 puffs into the lungs every 6 (six) hours as needed for wheezing or shortness of breath. 3 each 3   amLODipine (NORVASC) 5 MG tablet TAKE 1 TABLET BY MOUTH DAILY 90 tablet 0   aspirin EC 81 MG tablet Take 81 mg by mouth daily. Swallow whole.     blood glucose meter kit and supplies KIT Dispense based on patient and insurance preference. Use up to four times daily as directed. (FOR ICD-9 250.00, 250.01). 1 each 0   blood glucose meter kit and supplies Dispense based on insurance preference. Use up to four times daily as directed. (FOR ICD-10 E11.9 E11.65) AccuCheck Meter     1 each 0   Cholecalciferol (VITAMIN D3) 50 MCG (2000 UT) TABS Take 2,000 Units by mouth in the morning.     diclofenac Sodium (VOLTAREN) 1 % GEL Apply 2 g topically at bedtime. 100 g 0   insulin glargine (LANTUS SOLOSTAR) 100 UNIT/ML Solostar Pen INJECT SUBCUTANEOUSLY 55 UNITS  EVERY NIGHT AT BEDTIME 15 mL 2   Insulin Pen Needle (BD PEN NEEDLE NANO U/F) 32G X 4 MM MISC Inject 1 Dose into the skin daily. 100 each 0   lisinopril (ZESTRIL) 40 MG tablet TAKE 1 TABLET BY MOUTH DAILY 90 tablet 0   metoprolol succinate (TOPROL-XL) 25 MG 24 hr tablet TAKE 1 TABLET BY MOUTH ONCE  DAILY 90 tablet 0   nystatin (MYCOSTATIN/NYSTOP) powder Apply 1 application topically 3 (three) times daily. (Patient taking differently: Apply 1 application topically in the morning and at bedtime.) 60 g 2  pravastatin (PRAVACHOL) 80 MG tablet Take 1 tablet (80 mg total) by mouth every evening. Please call [phone] to schedule a November appointment for future refills. Thank you. 90 tablet 0   sitaGLIPtin-metformin (JANUMET) 50-1000 MG tablet Take 1 tablet by mouth 2 (two) times daily with a meal. 60 tablet 2   TRELEGY ELLIPTA 200-62.5-25 MCG/ACT AEPB USE 1 INHALATION BY MOUTH DAILY 180 each 3   Current Facility-Administered Medications on File Prior to Visit  Medication Dose Route Frequency Provider Last Rate Last Admin   sodium chloride flush  (NS) 0.9 % injection 3 mL  3 mL Intravenous Q12H Dunn, Dayna N, PA-C        No Known Allergies  Social History   Socioeconomic History   Marital status: Divorced    Spouse name: Not on file   Number of children: 3   Years of education: Not on file   Highest education level: Not on file  Occupational History   Occupation: retired    Comment: [employer]  Tobacco Use   Smoking status: Every Day    Packs/day: 2.50    Years: 60.00    Total pack years: 150.00    Types: Cigarettes    Passive exposure: Current   Smokeless tobacco: Never   Tobacco comments:    1 ppd  Vaping Use   Vaping Use: Never used  Substance and Sexual Activity   Alcohol use: No   Drug use: No   Sexual activity: Not on file  Other Topics Concern   Not on file  Social History Narrative   Marital status: divorced; not dating in 2019 but interested slightly.        Children:  3 children; 7 seven grandchildren; 1 gg      Lives: with oldest daughter, 2 grandchildren, 15 gg, 1 friend of daughters, daughter's boyfriend.       Employment: retired [name] age 70.      Tobacco; 1ppd x 50 years.  Not interested in 2019.      Alcohol: none      Exercise: none in 2019[place]      ADLs:   independent with ADLs; no assistant devices      Advanced Directives:  None; DNR/DNI; no HCPOA.    Social Determinants of Health   Financial Resource Strain: Not on file  Food Insecurity: Not on file  Transportation Needs: Not on file  Physical Activity: Not on file  Stress: Not on file  Social Connections: Not on file  Intimate Partner Violence: Not on file    Family History  Problem Relation Age of Onset   Diabetes Sister    Diabetes Brother    Hypertension Brother    Cancer Mother 21       colon(age 100) and lung (age 61)   Diabetes Mother    Heart disease Mother 26       CABG   Hyperlipidemia Mother    Cancer Father 33       stomach   Diabetes Maternal Grandmother    Mental  illness Sister     Past Surgical History:  Procedure Laterality Date   ABDOMINAL HYSTERECTOMY  2000   fibroids; ovaries intact.   BACK SURGERY     plate put in back   CHOLECYSTECTOMY N/A 05/01/2017   Procedure: LAPAROSCOPIC CHOLECYSTECTOMY WITH INTRAOPERATIVE CHOLANGIOGRAM;  Surgeon: Donnie Mesa, MD;  Location: [facility];  Service: General;  Laterality: N/A;   LEFT HEART  CATH AND CORONARY ANGIOGRAPHY N/A 11/18/2020   Procedure: LEFT HEART CATH AND CORONARY ANGIOGRAPHY;  Surgeon: Troy Sine, MD;  Location: [facility];  Service: Cardiovascular;  Laterality: N/A;   RECTAL POLYPECTOMY  05/16/2010   TVAdenoma polyp   TUBAL LIGATION      ROS: Review of Systems Negative except as stated above  PHYSICAL EXAM: BP 137/82 (BP Location: Left Arm, Patient Position: Sitting, Cuff Size: Normal)   Pulse 90   Temp 98.3 F (36.8 C)   Resp 16   Ht 5' 7.01" (1.702 m)   Wt 170 lb (77.1 kg)   SpO2 90%   BMI 26.62 kg/m   Physical Exam HENT:     Head: Normocephalic and atraumatic.  Eyes:     Extraocular Movements: Extraocular movements intact.     Conjunctiva/sclera: Conjunctivae normal.     Pupils: Pupils are equal, round, and reactive to light.  Cardiovascular:     Rate and Rhythm: Normal rate and regular rhythm.     Pulses: Normal pulses.     Heart sounds: Normal heart sounds.  Pulmonary:     Effort: Pulmonary effort is normal.     Breath sounds: Normal breath sounds.  Musculoskeletal:     Cervical back: Normal range of motion and neck supple.  Neurological:     General: No focal deficit present.     Mental Status: She is alert and oriented to person, place, and time.  Psychiatric:        Mood and Affect: Mood normal.        Behavior: Behavior normal.    Results for orders placed or performed in visit on 10/27/21  POCT glycosylated hemoglobin (Hb A1C)  Result Value Ref Range   Hemoglobin A1C 7.9 (A) 4.0 - 5.6 %   HbA1c POC (<> result, manual entry)     HbA1c, POC  (prediabetic range)     HbA1c, POC (controlled diabetic range)       ASSESSMENT AND PLAN: 1. Type 2 diabetes mellitus without complication, with long-term current use of insulin (HCC) - Hemoglobin A1c at goal at 7.9%, goal 8%. This is improved from previous 9.5%. - Dulaglutide discontinued related to side effect of low blood sugars.  - Patient states she has improved diet since last visit.  - Continue Janumet and Lantus as prescribed. No refills needed as of present.  - Discussed the importance of healthy eating habits, low-carbohydrate diet, low-sugar diet, regular aerobic exercise (at least 150 minutes a week as tolerated) and medication compliance to achieve or maintain control of diabetes. - Follow-up with primary provider in 3 months or sooner if needed.  - POCT glycosylated hemoglobin (Hb A1C)   Patient was given the opportunity to ask questions.  Patient verbalized understanding of the plan and was able to repeat key elements of the plan. Patient was given clear instructions to go to Emergency Department or return to medical center if symptoms don't improve, worsen, or new problems develop.The patient verbalized understanding.   Orders Placed This Encounter  Procedures   POCT glycosylated hemoglobin (Hb A1C)     Return in about 3 months (around 01/27/2022) for Follow-Up or next available chronic care mgmt.  Camillia Herter, NP

## 2021-10-22 ENCOUNTER — Other Ambulatory Visit: Payer: Self-pay | Admitting: Family

## 2021-10-22 DIAGNOSIS — I10 Essential (primary) hypertension: Secondary | ICD-10-CM

## 2021-10-23 NOTE — Telephone Encounter (Signed)
Requested medication (s) are due for refill today: yes  Requested medication (s) are on the active medication list: yes  Last refill:  08/03/21 #90 0 refills  Future visit scheduled: yes in 4 days  Notes to clinic:  To pharmacy: Please send a replace/new response with 90-Day Supply if appropriate to maximize member benefit. Requesting 1 year supply.     Requested Prescriptions  Pending Prescriptions Disp Refills   amLODipine (NORVASC) 5 MG tablet [Pharmacy Med Name: amLODIPine Besylate 5 MG Oral Tablet] 90 tablet 3    Sig: TAKE 1 TABLET BY MOUTH DAILY     Cardiovascular: Calcium Channel Blockers 2 Failed - 10/22/2021 11:26 PM      Failed - Last BP in normal range    BP Readings from Last 1 Encounters:  09/28/21 (!) 148/74         Passed - Last Heart Rate in normal range    Pulse Readings from Last 1 Encounters:  09/28/21 79         Passed - Valid encounter within last 6 months    Recent Outpatient Visits           3 weeks ago Type 2 diabetes mellitus without complication, with long-term current use of insulin ([place])   Primary Care at [facility], Amy J, NP   4 months ago Type 2 diabetes mellitus without complication, with long-term current use of insulin [facility])   Primary Care at [facility], Amy J, NP   7 months ago Type 2 diabetes mellitus without complication, with long-term current use of insulin [facility])   Primary Care at [facility], Amy J, NP   10 months ago Type 2 diabetes mellitus without complication, with long-term current use of insulin [facility])   Primary Care at [facility], Amy J, NP   1 year ago Type 2 diabetes mellitus without complication, with long-term current use of insulin [facility])   Primary Care at [facility], Flonnie Hailstone, NP       Future Appointments             In 4 days Camillia Herter, NP Primary Care at [facility]   In 2 months Camillia Herter, NP Primary Care at [facility]

## 2021-10-27 ENCOUNTER — Encounter: Payer: Self-pay | Admitting: Family

## 2021-10-27 ENCOUNTER — Ambulatory Visit (INDEPENDENT_AMBULATORY_CARE_PROVIDER_SITE_OTHER): Payer: Medicare Other | Admitting: Family

## 2021-10-27 VITALS — BP 137/82 | HR 90 | Temp 98.3°F | Resp 16 | Ht 67.01 in | Wt 170.0 lb

## 2021-10-27 DIAGNOSIS — E119 Type 2 diabetes mellitus without complications: Secondary | ICD-10-CM

## 2021-10-27 DIAGNOSIS — Z794 Long term (current) use of insulin: Secondary | ICD-10-CM

## 2021-10-27 LAB — POCT GLYCOSYLATED HEMOGLOBIN (HGB A1C): Hemoglobin A1C: 7.9 % — AB (ref 4.0–5.6)

## 2021-10-27 NOTE — Progress Notes (Signed)
Pt presents for diabetes follow-up   -pt states that since she started Trulicity her glucose levels have been lower in morning

## 2021-11-06 ENCOUNTER — Ambulatory Visit: Payer: Self-pay | Admitting: *Deleted

## 2021-11-06 NOTE — Telephone Encounter (Signed)
Pt called saying in the mornings when she gets up her glucose is low.  She said this morning it was 72.  Yesterday it was 60 then at lunch it was 97 and then at dinner it was 65.   CB@  [phone]    Chief Complaint: CBG lower than she is used to Symptoms: dropping low then going high then dropping again Frequency: everyday Pertinent Negatives: Patient denies fever, UTI etc Disposition: '[]'$ ED /'[]'$ Urgent Care (no appt availability in office) / '[]'$ Appointment(In office/virtual)/ '[]'$  [facility]/ '[]'$ Home Care/ '[]'$ Refused Recommended Disposition /'[]'$  Mobile Bus/ '[x]'$  Follow-up with PCP Additional Notes: Pt requesting a decrease in either her Lantus at HS or her Janumet. Pt seen in office 10 days ago. I discussed with her about when she takes something sweet when it is low then follow that with a protein so it does not drop right back down. Pt appreciative of info it has gone from 295 to 19 when she just has something sweet. Again pt question is should the Lantus or Janumet dose be lowered?   Reason for Disposition  [1] Blood glucose 70 mg/dl (3.9 mmol/l) or below, OR symptomatic AND [2] cause known  Answer Assessment - Initial Assessment Questions 1. SYMPTOMS: "What symptoms are you concerned about?"     Low blood sugar 2. ONSET:  "When did the symptoms start?"     When started Trulicity 3. BLOOD GLUCOSE: "What is your blood glucose level?"      60-72 4. USUAL RANGE: "What is your blood glucose level usually?" (e.g., usual fasting morning value, usual evening value)     60 5. TYPE 1 or 2:  "Do you know what type of diabetes you have?"  (e.g., Type 1, Type 2, Gestational; doesn't know)      Type 2 6. INSULIN: "Do you take insulin?" "What type of insulin(s) do you use? What is the mode of delivery? (syringe, pen; injection or pump) "When did you last give yourself an insulin dose?" (i.e., time or hours/minutes ago) "How much did you give?" (i.e., how many units)     yes 7.  DIABETES PILLS: "Do you take any pills for your diabetes?" If Yes, ask: "What is the name of the medicine(s) that you take for high blood sugar?"     yes 8. OTHER SYMPTOMS: "Do you have any symptoms?" (e.g., fever, frequent urination, difficulty breathing, vomiting)     no 9. LOW BLOOD GLUCOSE TREATMENT: "What have you done so far to treat the low blood glucose level?"     Feels bad if glucose 50-60s 10. FOOD: "When did you last eat or drink?"       Eating better, stopped snacking 11. ALONE: "Are you alone right now or is someone with you?"        no 12. PREGNANCY: "Is there any chance you are pregnant?" "When was your last menstrual period?"       no  Protocols used: Diabetes - Low Blood Sugar-A-AH

## 2021-11-07 NOTE — Telephone Encounter (Signed)
Decrease Lantus from 55 units to 50 units. Schedule appointment to see me on next week or sooner if needed.

## 2021-11-08 NOTE — Telephone Encounter (Signed)
MyChart msg sent to pt.

## 2021-11-29 ENCOUNTER — Other Ambulatory Visit: Payer: Self-pay | Admitting: Family

## 2021-11-29 DIAGNOSIS — E119 Type 2 diabetes mellitus without complications: Secondary | ICD-10-CM

## 2021-11-29 NOTE — Telephone Encounter (Signed)
Requested Prescriptions  Pending Prescriptions Disp Refills   JANUMET 50-1000 MG tablet [Pharmacy Med Name: Janumet 50-1000 MG Oral Tablet] 180 tablet 0    Sig: TAKE 1 TABLET BY MOUTH TWICE  DAILY WITH MEALS     Endocrinology:  Diabetes - Biguanide + DPP-4 Inhibitor Combos Failed - 11/29/2021  4:53 AM      Failed - Cr in normal range and within 360 days    Creat  Date Value Ref Range Status  08/31/2015 1.14 (H) 0.60 - 0.93 mg/dL Final    Comment:      For patients > or = 77 years of age: The upper reference limit for Creatinine is approximately 13% higher for people identified as African-American.      Creatinine, Ser  Date Value Ref Range Status  11/16/2020 0.92 0.57 - 1.00 mg/dL Final         Failed - eGFR in normal range and within 360 days    GFR, Est African American  Date Value Ref Range Status  09/21/2014 66 >=60 mL/min Final   GFR calc Af Amer  Date Value Ref Range Status  05/13/2019 75 >59 mL/min/1.73 Final    Comment:    **[facility] currently reports eGFR in compliance with the current**   recommendations of the [employer]. [facility] will   update reporting as new guidelines are published from the NKF-ASN   Task force.    GFR, Est Non African American  Date Value Ref Range Status  09/21/2014 57 (L) >=60 mL/min Final    Comment:      The estimated GFR is a calculation valid for adults (>=76 years old) that uses the CKD-EPI algorithm to adjust for age and sex. It is   not to be used for children, pregnant women, hospitalized patients,    patients on dialysis, or with rapidly changing kidney function. According to the NKDEP, eGFR >89 is normal, 60-89 shows mild impairment, 30-59 shows moderate impairment, 15-29 shows severe impairment and <15 is ESRD.      GFR calc non Af Amer  Date Value Ref Range Status  05/13/2019 65 >59 mL/min/1.73 Final   eGFR  Date Value Ref Range Status  11/16/2020 65 >59 mL/min/1.73 Final         Failed - B12  Level in normal range and within 720 days    No results found for: "VITAMINB12"       Failed - CBC within normal limits and completed in the last 12 months    WBC  Date Value Ref Range Status  11/16/2020 7.3 3.4 - 10.8 x10E3/uL Final  04/18/2017 19.9 (H) 4.0 - 10.5 K/uL Final   RBC  Date Value Ref Range Status  11/16/2020 5.08 3.77 - 5.28 x10E6/uL Final  04/18/2017 5.33 (H) 3.87 - 5.11 MIL/uL Final   Hemoglobin  Date Value Ref Range Status  11/16/2020 13.3 11.1 - 15.9 g/dL Final   Hematocrit  Date Value Ref Range Status  11/16/2020 41.3 34.0 - 46.6 % Final   MCHC  Date Value Ref Range Status  11/16/2020 32.2 31.5 - 35.7 g/dL Final  04/18/2017 32.8 30.0 - 36.0 g/dL Final   [facility]  Date Value Ref Range Status  11/16/2020 26.2 (L) 26.6 - 33.0 pg Final  04/18/2017 28.7 26.0 - 34.0 pg Final   MCV  Date Value Ref Range Status  11/16/2020 81 79 - 97 fL Final   Platelet Count, POC  Date Value Ref Range Status  05/31/2015 255 142 -  424 K/uL Final   RDW  Date Value Ref Range Status  11/16/2020 14.1 11.7 - 15.4 % Final   RDW, POC  Date Value Ref Range Status  05/31/2015 14.4 % Final         Passed - HBA1C is between 0 and 7.9 and within 180 days    Hemoglobin A1C  Date Value Ref Range Status  10/27/2021 7.9 (A) 4.0 - 5.6 % Corrected   Hgb A1c MFr Bld  Date Value Ref Range Status  09/28/2021 9.5 (H) 4.8 - 5.6 % Final    Comment:             Prediabetes: 5.7 - 6.4          Diabetes: >6.4          Glycemic control for adults with diabetes: <7.0          Passed - Valid encounter within last 6 months    Recent Outpatient Visits           1 month ago Type 2 diabetes mellitus without complication, with long-term current use of insulin [facility])   Primary Care at [facility], Amy J, NP   2 months ago Type 2 diabetes mellitus without complication, with long-term current use of insulin [facility])   Primary Care at [facility], Amy J, NP   6 months ago  Type 2 diabetes mellitus without complication, with long-term current use of insulin [facility])   Primary Care at [facility], Amy J, NP   9 months ago Type 2 diabetes mellitus without complication, with long-term current use of insulin [facility])   Primary Care at [facility] Amy J, NP   12 months ago Type 2 diabetes mellitus without complication, with long-term current use of insulin [facility])   Primary Care at [facility], Flonnie Hailstone, NP       Future Appointments             In 2 months Camillia Herter, NP Primary Care at [facility]

## 2021-11-30 ENCOUNTER — Other Ambulatory Visit: Payer: Self-pay | Admitting: Family

## 2021-11-30 DIAGNOSIS — I10 Essential (primary) hypertension: Secondary | ICD-10-CM

## 2021-12-01 ENCOUNTER — Ambulatory Visit (INDEPENDENT_AMBULATORY_CARE_PROVIDER_SITE_OTHER): Payer: Medicare Other

## 2021-12-01 VITALS — Ht 67.0 in | Wt 170.0 lb

## 2021-12-01 DIAGNOSIS — Z0000 Encounter for general adult medical examination without abnormal findings: Secondary | ICD-10-CM

## 2021-12-01 NOTE — Progress Notes (Signed)
Subjective:   [name] is a 76 y.o. female who presents for Medicare Annual (Subsequent) preventive examination.  Review of Systems           Objective:    There were no vitals filed for this visit. There is no height or weight on file to calculate BMI.     11/18/2020    8:28 AM 08/01/2020    8:54 AM 07/24/2017   10:02 AM 04/30/2017    2:06 PM 06/06/2016    9:13 AM 05/31/2015    5:16 PM  Advanced Directives  Does Patient Have a Medical Advance Directive? _0  No  Would patient like information on creating a medical advance directive? No - Patient declined Yes (Inpatient - patient defers creating a medical advance directive at this time - Information given) No - Patient declined No - Patient declined Yes (ED - Information included in AVS)     Current Medications (verified) Outpatient Encounter Medications as of 12/01/2021  Medication Sig   ACCU-CHEK GUIDE test strip USE AS DIRECTED   Accu-Chek Softclix Lancets lancets USE UP TO 4 TIMES DAILY AS  DIRECTED   acetaminophen (TYLENOL) 500 MG tablet Take 500-1,000 mg by mouth daily as needed (pain).   albuterol (VENTOLIN HFA) 108 (90 Base) MCG/ACT inhaler Inhale 2 puffs into the lungs every 6 (six) hours as needed for wheezing or shortness of breath.   amLODipine (NORVASC) 5 MG tablet TAKE 1 TABLET BY MOUTH DAILY   aspirin EC 81 MG tablet Take 81 mg by mouth daily. Swallow whole.   blood glucose meter kit and supplies KIT Dispense based on patient and insurance preference. Use up to four times daily as directed. (FOR ICD-9 250.00, 250.01).   blood glucose meter kit and supplies Dispense based on insurance preference. Use up to four times daily as directed. (FOR ICD-10 E11.9 E11.65) AccuCheck Meter       Cholecalciferol (VITAMIN D3) 50 MCG (2000 UT) TABS Take 2,000 Units by mouth in the morning.   diclofenac Sodium (VOLTAREN) 1 % GEL Apply 2 g topically at bedtime.   insulin glargine (LANTUS SOLOSTAR) 100 UNIT/ML Solostar  Pen INJECT SUBCUTANEOUSLY 55 UNITS  EVERY NIGHT AT BEDTIME   Insulin Pen Needle (BD PEN NEEDLE NANO U/F) 32G X 4 MM MISC Inject 1 Dose into the skin daily.   lisinopril (ZESTRIL) 40 MG tablet TAKE 1 TABLET BY MOUTH DAILY   metoprolol succinate (TOPROL-XL) 25 MG 24 hr tablet TAKE 1 TABLET BY MOUTH ONCE  DAILY   nystatin (MYCOSTATIN/NYSTOP) powder Apply 1 application topically 3 (three) times daily. (Patient taking differently: Apply 1 application topically in the morning and at bedtime.)   pravastatin (PRAVACHOL) 80 MG tablet Take 1 tablet (80 mg total) by mouth every evening. Please call [phone] to schedule a November appointment for future refills. Thank you.   sitaGLIPtin-metformin (JANUMET) 50-1000 MG tablet TAKE 1 TABLET BY MOUTH TWICE  DAILY WITH MEALS   TRELEGY ELLIPTA 200-62.5-25 MCG/ACT AEPB USE 1 INHALATION BY MOUTH DAILY   Facility-Administered Encounter Medications as of 12/01/2021  Medication   sodium chloride flush (NS) 0.9 % injection 3 mL    Allergies (verified) Patient has no known allergies.   History: Past Medical History:  Diagnosis Date   Carotid stenosis    1-39% bilateral stenosis   COPD (chronic obstructive pulmonary disease) (HCC)    Diabetes mellitus    Hyperlipidemia    Hypertension    Night sweats  Rectal polyp    Past Surgical History:  Procedure Laterality Date   ABDOMINAL HYSTERECTOMY  2000   fibroids; ovaries intact.   BACK SURGERY     plate put in back   CHOLECYSTECTOMY N/A 05/01/2017   Procedure: LAPAROSCOPIC CHOLECYSTECTOMY WITH INTRAOPERATIVE CHOLANGIOGRAM;  Surgeon: Donnie Mesa, MD;  Location: [facility];  Service: General;  Laterality: N/A;   LEFT HEART CATH AND CORONARY ANGIOGRAPHY N/A 11/18/2020   Procedure: LEFT HEART CATH AND CORONARY ANGIOGRAPHY;  Surgeon: Troy Sine, MD;  Location: [facility];  Service: Cardiovascular;  Laterality: N/A;   RECTAL POLYPECTOMY  05/16/2010   TVAdenoma polyp   TUBAL LIGATION     Family  History  Problem Relation Age of Onset   Diabetes Sister    Diabetes Brother    Hypertension Brother    Cancer Mother 71       colon(age 63) and lung (age 40)   Diabetes Mother    Heart disease Mother 67       CABG   Hyperlipidemia Mother    Cancer Father 67       stomach   Diabetes Maternal Grandmother    Mental illness Sister    Social History   Socioeconomic History   Marital status: Divorced    Spouse name: Not on file   Number of children: 3   Years of education: Not on file   Highest education level: Not on file  Occupational History   Occupation: retired    Comment: [employer]  Tobacco Use   Smoking status: Every Day    Packs/day: 2.50    Years: 60.00    Total pack years: 150.00    Types: Cigarettes    Passive exposure: Current   Smokeless tobacco: Never   Tobacco comments:    1 ppd  Vaping Use   Vaping Use: Never used  Substance and Sexual Activity   Alcohol use: No   Drug use: No   Sexual activity: Not on file  Other Topics Concern   Not on file  Social History Narrative   Marital status: divorced; not dating in 2019 but interested slightly.        Children:  3 children; 7 seven grandchildren; 1 gg      Lives: with oldest daughter, 2 grandchildren, 30 gg, 1 friend of daughters, daughter's boyfriend.       Employment: retired [employer] age 8.      Tobacco; 1ppd x 50 years.  Not interested in 2019.      Alcohol: none      Exercise: none in 2019. [place]      ADLs:   independent with ADLs; no assistant devices      Advanced Directives:  None; DNR/DNI; no HCPOA.    Social Determinants of Health   Financial Resource Strain: Not on file  Food Insecurity: Not on file  Transportation Needs: Not on file  Physical Activity: Not on file  Stress: Not on file  Social Connections: Not on file    Tobacco Counseling Ready to quit: Not Answered Counseling given: Not Answered Tobacco comments: 1 ppd   Clinical  Intake:              How often do you need to have someone help you when you read instructions, pamphlets, or other written materials from your doctor or pharmacy?: (P) 1 - Never  Diabetic?Yes         Activities of  Daily Living    11/30/2021   11:37 AM  In your present state of health, do you have any difficulty performing the following activities:  Hearing? 0  Vision? 0  Difficulty concentrating or making decisions? 0  Walking or climbing stairs? 0  Dressing or bathing? 0  Doing errands, shopping? 0  Preparing Food and eating ? N  Using the Toilet? N  In the past six months, have you accidently leaked urine? N  Do you have problems with loss of bowel control? N  Managing your Medications? N  Managing your Finances? N  Housekeeping or managing your Housekeeping? N    Patient Care Team: Camillia Herter, NP as PCP - General (Nurse Practitioner) Sueanne Margarita, MD as PCP - Cardiology (Cardiology) [facility], [place], Jerl Santos, [place] as Physiological scientist  Indicate any recent [place] you may have received from other than [facility] providers in the past year (date may be approximate).     Assessment:   This is a routine wellness examination for [facility].  Hearing/Vision screen No results found.  Dietary issues and exercise activities discussed:     Goals Addressed   None   Depression Screen    10/27/2021    9:50 AM 09/28/2021   10:57 AM 05/30/2021    9:34 AM 12/01/2020   10:56 AM 08/31/2020   11:03 AM 08/01/2020    8:54 AM 05/03/2020   10:44 AM  PHQ 2/9 Scores  PHQ - 2 Score 0 0 0 0 0 0 0  PHQ- 9 Score  0     0    Fall Risk    11/30/2021   11:37 AM 09/28/2021   10:51 AM 08/01/2020    8:54 AM 04/22/2020   10:41 AM 02/17/2020    9:47 AM  Fall Risk   Falls in the past year? 0 0 0 0 0  Number falls in past yr: 0 0 0 0 0  Injury with Fall? 0 0 0 0 0  Risk for fall due to :  No Fall Risks No Fall Risks No Fall Risks    Follow up  Falls evaluation completed Falls evaluation completed Falls evaluation completed Falls evaluation completed    [place]:  Any stairs in or around the home? Yes  If so, are there any without handrails? No  Home free of loose throw rugs in walkways, pet beds, electrical cords, etc? No  Adequate lighting in your home to reduce risk of falls? Yes   ASSISTIVE DEVICES UTILIZED TO PREVENT FALLS:  Life alert? No  Use of a cane, walker or w/c? No  Grab bars in the bathroom? Yes  Shower chair or bench in shower? Yes  Elevated toilet seat or a handicapped toilet? No   TIMED UP AND GO:  Was the test performed? No .  Length of time to ambulate 10 feet: n/a sec.     Cognitive Function:    08/01/2020    8:55 AM  MMSE - Mini Mental State Exam  Orientation to time 5  Orientation to Place 5  Registration 3  Attention/ Calculation 5  Recall 3  Language- name 2 objects 2  Language- repeat 1  Language- follow 3 step command 3  Language- read & follow direction 1  Write a sentence 1  Copy design 1  Total score 30        Immunizations Immunization History  Administered Date(s) Administered  Fluad Quad(high Dose 65+) 10/14/2018   Influenza Split 09/28/2011, 09/15/2012, 09/23/2013   Influenza,inj,Quad PF,6+ Mos 09/21/2014, 09/11/2016, 09/28/2021   Influenza-Unspecified 09/29/2015, 10/16/2019   PFIZER(Purple Top)SARS-COV-2 Vaccination 03/12/2019, 04/01/2019, 10/20/2019, 05/27/2020   Pneumococcal Conjugate-13 02/07/2015   Pneumococcal Polysaccharide-23 12/20/2011   Tdap 09/21/2014   Zoster, Live 04/17/2012    TDAP status: Up to date  Flu Vaccine status: Up to date  Pneumococcal vaccine status: Up to date  Covid-19 vaccine status: Completed vaccines  Qualifies for Shingles Vaccine? Yes   Zostavax completed No   Shingrix Completed?: No.    Education has been provided regarding the importance of this vaccine. Patient has been  advised to call insurance company to determine out of pocket expense if they have not yet received this vaccine. Advised may also receive vaccine at local pharmacy or Health Dept. Verbalized acceptance and understanding.  Screening Tests Health Maintenance  Topic Date Due   Zoster Vaccines- Shingrix (1 of 2) Never done   FOOT EXAM  10/14/2019   COVID-19 Vaccine (5 - Pfizer series) 07/22/2020   Diabetic kidney evaluation - Urine ACR  08/01/2021   Lung Cancer Screening  10/25/2021   Diabetic kidney evaluation - GFR measurement  11/16/2021   HEMOGLOBIN A1C  04/28/2022   OPHTHALMOLOGY EXAM  07/01/2022   Medicare Annual Wellness (AWV)  12/02/2022   Pneumonia Vaccine 44+ Years old  Completed   INFLUENZA VACCINE  Completed   DEXA SCAN  Completed   Hepatitis C Screening  Completed   HPV VACCINES  Aged Out   COLONOSCOPY (Pts 45-75yr Insurance coverage will need to be confirmed)  [place]  Topic Date Due   Zoster Vaccines- Shingrix (1 of 2) Never done   FOOT EXAM  10/14/2019   COVID-19 Vaccine (5 - Pfizer series) 07/22/2020   Diabetic kidney evaluation - Urine ACR  08/01/2021   Lung Cancer Screening  10/25/2021   Diabetic kidney evaluation - GFR measurement  11/16/2021    Colorectal cancer screening: No longer required.   Mammogram status: Completed 1. Repeat every year  Bone Density status: Completed 08/20/2016. Results reflect: Bone density results: NORMAL. Repeat every n/a years.  Lung Cancer Screening: (Low Dose CT Chest recommended if Age 77-80years, 30 pack-year currently smoking OR have quit w/in 15years.) does qualify.   Lung Cancer Screening Referral: needed  Additional Screening:  Hepatitis C Screening: does qualify; Completed 09/21/2014  Vision Screening: Recommended annual ophthalmology exams for early detection of glaucoma and other disorders of the eye. Is the patient up to date with their annual eye exam?  Yes  Who  is the provider or what is the name of the office in which the patient attends annual eye exams? [facility] If pt is not established with a provider, would they like to be referred to a provider to establish care?  Already has .   Dental Screening: Recommended annual dental exams for proper oral hygiene  Community Resource Referral / Chronic Care Management: CRR required this visit?  No   CCM required this visit?  No      Plan:     I have personally reviewed and noted the following in the patient's chart:   Medical and social history Use of alcohol, tobacco or illicit drugs  Current medications and supplements including opioid prescriptions. Patient is not currently taking opioid prescriptions. Functional ability and status Nutritional status Physical activity Advanced directives List of other physicians Hospitalizations, surgeries, and ER  visits in previous 12 months Vitals Screenings to include cognitive, depression, and falls Referrals and appointments  In addition, I have reviewed and discussed with patient certain preventive protocols, quality metrics, and best practice recommendations. A written personalized care plan for preventive services as well as general preventive health recommendations were provided to patient.     Tonie Griffith, [place]   12/01/2021   Nurse Notes: Patient was very pleasant.

## 2021-12-01 NOTE — Telephone Encounter (Signed)
Requested Prescriptions  Pending Prescriptions Disp Refills   lisinopril (ZESTRIL) 40 MG tablet [Pharmacy Med Name: Lisinopril 40 MG Oral Tablet] 90 tablet 0    Sig: TAKE 1 TABLET BY MOUTH DAILY     Cardiovascular:  ACE Inhibitors Failed - 11/30/2021 10:09 PM      Failed - Cr in normal range and within 180 days    Creat  Date Value Ref Range Status  08/31/2015 1.14 (H) 0.60 - 0.93 mg/dL Final    Comment:      For patients > or = 77 years of age: The upper reference limit for Creatinine is approximately 13% higher for people identified as African-American.      Creatinine, Ser  Date Value Ref Range Status  11/16/2020 0.92 0.57 - 1.00 mg/dL Final         Failed - K in normal range and within 180 days    Potassium  Date Value Ref Range Status  11/16/2020 5.1 3.5 - 5.2 mmol/L Final         Passed - Patient is not pregnant      Passed - Last BP in normal range    BP Readings from Last 1 Encounters:  10/27/21 137/82         Passed - Valid encounter within last 6 months    Recent Outpatient Visits           1 month ago Type 2 diabetes mellitus without complication, with long-term current use of insulin ([place])   Primary Care at [facility], Amy J, NP   2 months ago Type 2 diabetes mellitus without complication, with long-term current use of insulin [facility])   Primary Care at [facility], Amy J, NP   6 months ago Type 2 diabetes mellitus without complication, with long-term current use of insulin [facility])   Primary Care at [facility], Amy J, NP   9 months ago Type 2 diabetes mellitus without complication, with long-term current use of insulin [facility])   Primary Care at [facility], Amy J, NP   1 year ago Type 2 diabetes mellitus without complication, with long-term current use of insulin [facility])   Primary Care at [facility], Amy J, NP       Future Appointments             In 2 months Camillia Herter, NP Primary Care at  [facility]             metoprolol succinate (TOPROL-XL) 25 MG 24 hr tablet [Pharmacy Med Name: Metoprolol Succinate ER 25 MG Oral Tablet Extended Release 24 Hour] 90 tablet 0    Sig: TAKE 1 TABLET BY MOUTH ONCE  DAILY     Cardiovascular:  Beta Blockers Passed - 11/30/2021 10:09 PM      Passed - Last BP in normal range    BP Readings from Last 1 Encounters:  10/27/21 137/82         Passed - Last Heart Rate in normal range    Pulse Readings from Last 1 Encounters:  10/27/21 90         Passed - Valid encounter within last 6 months    Recent Outpatient Visits           1 month ago Type 2 diabetes mellitus without complication, with long-term current use of insulin [facility])   Primary Care at [facility], Amy J, NP   2 months ago Type 2 diabetes mellitus  without complication, with long-term current use of insulin [facility])   Primary Care at [facility], Amy J, NP   6 months ago Type 2 diabetes mellitus without complication, with long-term current use of insulin [facility])   Primary Care at [facility], Amy J, NP   9 months ago Type 2 diabetes mellitus without complication, with long-term current use of insulin [facility])   Primary Care at [facility], Amy J, NP   1 year ago Type 2 diabetes mellitus without complication, with long-term current use of insulin [facility])   Primary Care at [facility], Flonnie Hailstone, NP       Future Appointments             In 2 months Camillia Herter, NP Primary Care at [facility]

## 2021-12-03 ENCOUNTER — Other Ambulatory Visit: Payer: Self-pay | Admitting: Cardiology

## 2021-12-25 ENCOUNTER — Other Ambulatory Visit: Payer: Self-pay | Admitting: Family

## 2021-12-25 DIAGNOSIS — Z794 Long term (current) use of insulin: Secondary | ICD-10-CM

## 2021-12-28 ENCOUNTER — Other Ambulatory Visit: Payer: Self-pay | Admitting: Cardiology

## 2021-12-28 ENCOUNTER — Ambulatory Visit: Payer: Medicare Other | Admitting: Family

## 2022-01-02 ENCOUNTER — Other Ambulatory Visit: Payer: Self-pay | Admitting: Family

## 2022-01-02 DIAGNOSIS — I10 Essential (primary) hypertension: Secondary | ICD-10-CM

## 2022-01-03 NOTE — Telephone Encounter (Signed)
Requested Prescriptions  Pending Prescriptions Disp Refills   amLODipine (NORVASC) 5 MG tablet [Pharmacy Med Name: amLODIPine Besylate 5 MG Oral Tablet] 90 tablet 1    Sig: TAKE 1 TABLET BY MOUTH DAILY     Cardiovascular: Calcium Channel Blockers 2 Passed - 01/02/2022 11:20 PM      Passed - Last BP in normal range    BP Readings from Last 1 Encounters:  10/27/21 137/82         Passed - Last Heart Rate in normal range    Pulse Readings from Last 1 Encounters:  10/27/21 90         Passed - Valid encounter within last 6 months    Recent Outpatient Visits           2 months ago Type 2 diabetes mellitus without complication, with long-term current use of insulin [facility])   Primary Care at [facility], Amy J, NP   3 months ago Type 2 diabetes mellitus without complication, with long-term current use of insulin [facility])   Primary Care at [facility], Amy J, NP   7 months ago Type 2 diabetes mellitus without complication, with long-term current use of insulin [facility])   Primary Care at [facility], Amy J, NP   10 months ago Type 2 diabetes mellitus without complication, with long-term current use of insulin [facility])   Primary Care at [facility], Amy J, NP   1 year ago Type 2 diabetes mellitus without complication, with long-term current use of insulin [facility])   Primary Care at [facility], Flonnie Hailstone, NP       Future Appointments             In 3 weeks Camillia Herter, NP Primary Care at [facility]

## 2022-01-30 ENCOUNTER — Ambulatory Visit: Payer: Medicare Other | Admitting: Family

## 2022-02-01 ENCOUNTER — Other Ambulatory Visit: Payer: Self-pay | Admitting: Family

## 2022-02-01 ENCOUNTER — Ambulatory Visit: Payer: Medicare Other | Admitting: Family

## 2022-02-01 DIAGNOSIS — I10 Essential (primary) hypertension: Secondary | ICD-10-CM

## 2022-02-02 ENCOUNTER — Emergency Department (HOSPITAL_COMMUNITY): Payer: Medicare Other

## 2022-02-02 ENCOUNTER — Emergency Department (HOSPITAL_COMMUNITY)
Admission: EM | Admit: 2022-02-02 | Discharge: 2022-02-02 | Disposition: A | Discharge status: Home / Self Care | Payer: Medicare Other | Attending: Emergency Medicine | Admitting: Emergency Medicine

## 2022-02-02 DIAGNOSIS — J449 Chronic obstructive pulmonary disease, unspecified: Secondary | ICD-10-CM | POA: Diagnosis not present

## 2022-02-02 DIAGNOSIS — Z794 Long term (current) use of insulin: Secondary | ICD-10-CM | POA: Diagnosis not present

## 2022-02-02 DIAGNOSIS — R200 Anesthesia of skin: Secondary | ICD-10-CM | POA: Diagnosis present

## 2022-02-02 DIAGNOSIS — I10 Essential (primary) hypertension: Secondary | ICD-10-CM | POA: Diagnosis not present

## 2022-02-02 DIAGNOSIS — Z20822 Contact with and (suspected) exposure to covid-19: Secondary | ICD-10-CM | POA: Insufficient documentation

## 2022-02-02 DIAGNOSIS — R0602 Shortness of breath: Secondary | ICD-10-CM | POA: Diagnosis not present

## 2022-02-02 DIAGNOSIS — E119 Type 2 diabetes mellitus without complications: Secondary | ICD-10-CM | POA: Diagnosis not present

## 2022-02-02 DIAGNOSIS — Z7982 Long term (current) use of aspirin: Secondary | ICD-10-CM | POA: Diagnosis not present

## 2022-02-02 DIAGNOSIS — Z79899 Other long term (current) drug therapy: Secondary | ICD-10-CM | POA: Diagnosis not present

## 2022-02-02 LAB — BASIC METABOLIC PANEL
Anion gap: 10 (ref 5–15)
BUN: 16 mg/dL (ref 8–23)
CO2: 23 mmol/L (ref 22–32)
Calcium: 9.2 mg/dL (ref 8.9–10.3)
Chloride: 106 mmol/L (ref 98–111)
Creatinine, Ser: 1.12 mg/dL — ABNORMAL HIGH (ref 0.44–1.00)
GFR, Estimated: 51 mL/min — ABNORMAL LOW (ref >60)
Glucose, Bld: 152 mg/dL — ABNORMAL HIGH (ref 70–99)
Potassium: 4.6 mmol/L (ref 3.5–5.1)
Sodium: 139 mmol/L (ref 135–145)

## 2022-02-02 LAB — CBC
HCT: 44.3 % (ref 36.0–46.0)
Hemoglobin: 13.6 g/dL (ref 12.0–15.0)
MCH: 26.8 pg (ref 26.0–34.0)
MCHC: 30.7 g/dL (ref 30.0–36.0)
MCV: 87.2 fL (ref 80.0–100.0)
Platelets: 258 10*3/uL (ref 150–400)
RBC: 5.08 MIL/uL (ref 3.87–5.11)
RDW: 16.3 % — ABNORMAL HIGH (ref 11.5–15.5)
WBC: 8.7 10*3/uL (ref 4.0–10.5)
nRBC: 0 % (ref 0.0–0.2)

## 2022-02-02 LAB — CBG MONITORING, ED
Glucose-Capillary: 105 mg/dL — ABNORMAL HIGH (ref 70–99)
Glucose-Capillary: 79 mg/dL (ref 70–99)

## 2022-02-02 LAB — RESP PANEL BY RT-PCR (RSV, FLU A&B, COVID)  RVPGX2
Influenza A by PCR: NEGATIVE
Influenza B by PCR: NEGATIVE
Resp Syncytial Virus by PCR: NEGATIVE
SARS Coronavirus 2 by RT PCR: NEGATIVE

## 2022-02-02 LAB — TROPONIN I (HIGH SENSITIVITY): Troponin I (High Sensitivity): 4 ng/L (ref <18)

## 2022-02-02 NOTE — ED Provider Triage Note (Signed)
Emergency Medicine Provider Triage Evaluation Note  [name] , a 77 y.o. female  was evaluated in triage.  Pt complains of right arm numbness.  Patient reports symptoms beginning around 10 AM this morning.  Describes numbness in antecubital fossa of the right arm.  Denies visual disturbance, gait abnormality, slurred speech, facial droop, other weakness or sensory deficits.  Reports COPD at baseline with baseline shortness of breath and states that it is slightly worse today.  Denies fever, chills, night sweats, chest pain, cough, congestion  Review of Systems  Positive: See above Negative:   Physical Exam  BP (!) 103/58 (BP Location: Right Arm)   Pulse 92   Temp 98.9 F (37.2 C) (Oral)   Resp 20   SpO2 98%  Gen:   Awake, no distress   Resp:  Normal effort  MSK:   Moves extremities without difficulty  Other:  Current 2 through 7 grossly intact.  Patient complaining of mild sensory deficits in antecubital fossa of right arm.  Strength symmetric bilateral upper and lower extremities.  Otherwise, sensation intact bilateral upper extremities and lower extremities along major nerve distributions.  Medical Decision Making  Medically screening exam initiated at 1:01 PM.  Appropriate orders placed.  Margaretha Sheffield was informed that the remainder of the evaluation will be completed by another provider, this initial triage assessment does not replace that evaluation, and the importance of remaining in the ED until their evaluation is complete.     Wilnette Kales, [place] 02/02/22 [phone]

## 2022-02-02 NOTE — ED Triage Notes (Signed)
Patient here for evaluation of shortness of breath and numbness in left arm that started at 1000 this morning. Patient states she was up getting dressed for an appointment when she noticed her arm was numb. No arm drift, no dysarthria, no facial droop, no vision changes, sensation equal bilaterally in face, arms, and legs. Patient is alert, oriented, and in no apparent distress at this time.

## 2022-02-02 NOTE — Discharge Instructions (Addendum)
Your workups were reassuring today. I recommend close follow-up with your PCP for reevaluation.  Please do not hesitate to return to emergency department if worrisome signs symptoms we discussed become apparent.

## 2022-02-02 NOTE — ED Notes (Signed)
Pt upset about wait for results states that she wishes to leave AMA. Requesting to leave due to not having night meds and wanting food. Requesting blood sugar check due to being diabetic. CBG checked, sandwich provided. PA Yvone Neu made aware of pts desire to leave. Pt agreed to stay after receiving sandwich.

## 2022-02-02 NOTE — Telephone Encounter (Signed)
Requested Prescriptions  Pending Prescriptions Disp Refills   metoprolol succinate (TOPROL-XL) 25 MG 24 hr tablet [Pharmacy Med Name: Metoprolol Succinate ER 25 MG Oral Tablet Extended Release 24 Hour] 90 tablet 0    Sig: TAKE 1 TABLET BY MOUTH ONCE  DAILY     Cardiovascular:  Beta Blockers Passed - 02/01/2022 11:07 PM      Passed - Last BP in normal range    BP Readings from Last 1 Encounters:  10/27/21 137/82         Passed - Last Heart Rate in normal range    Pulse Readings from Last 1 Encounters:  10/27/21 90         Passed - Valid encounter within last 6 months    Recent Outpatient Visits           3 months ago Type 2 diabetes mellitus without complication, with long-term current use of insulin ([place])   Primary Care at [facility], Amy J, NP   4 months ago Type 2 diabetes mellitus without complication, with long-term current use of insulin [facility])   Primary Care at [facility], Amy J, NP   8 months ago Type 2 diabetes mellitus without complication, with long-term current use of insulin [facility])   Primary Care at [facility], Amy J, NP   11 months ago Type 2 diabetes mellitus without complication, with long-term current use of insulin [facility])   Primary Care at [facility], Amy J, NP   1 year ago Type 2 diabetes mellitus without complication, with long-term current use of insulin [facility])   Primary Care at [facility] [place], NP       Future Appointments             In 1 week Camillia Herter, NP Primary Care at [facility]             lisinopril (ZESTRIL) 40 MG tablet [Pharmacy Med Name: Lisinopril 40 MG Oral Tablet] 90 tablet     Sig: TAKE 1 TABLET BY MOUTH DAILY     Cardiovascular:  ACE Inhibitors Failed - 02/01/2022 11:07 PM      Failed - Cr in normal range and within 180 days    Creat  Date Value Ref Range Status  08/31/2015 1.14 (H) 0.60 - 0.93 mg/dL Final    Comment:      For patients > or = 78 years of  age: The upper reference limit for Creatinine is approximately 13% higher for people identified as African-American.      Creatinine, Ser  Date Value Ref Range Status  11/16/2020 0.92 0.57 - 1.00 mg/dL Final         Failed - K in normal range and within 180 days    Potassium  Date Value Ref Range Status  11/16/2020 5.1 3.5 - 5.2 mmol/L Final         Passed - Patient is not pregnant      Passed - Last BP in normal range    BP Readings from Last 1 Encounters:  10/27/21 137/82         Passed - Valid encounter within last 6 months    Recent Outpatient Visits           3 months ago Type 2 diabetes mellitus without complication, with long-term current use of insulin [facility])   [facility], [facility] J, NP   4 months ago Type 2 diabetes mellitus  without complication, with long-term current use of insulin [facility])   Primary Care at [facility], Amy J, NP   8 months ago Type 2 diabetes mellitus without complication, with long-term current use of insulin [facility])   [facility] Care at [facility], Amy J, NP   11 months ago Type 2 diabetes mellitus without complication, with long-term current use of insulin [facility])   Primary Care at [facility], Amy J, NP   1 year ago Type 2 diabetes mellitus without complication, with long-term current use of insulin [facility])   Primary Care at [facility], Flonnie Hailstone, NP       Future Appointments             In 1 week Camillia Herter, NP Primary Care at [facility]

## 2022-02-02 NOTE — ED Provider Notes (Signed)
[facility] Provider Note   CSN: [id number] Arrival date & time: 02/02/22  1155     History  Chief Complaint  Patient presents with   Numbness    [name] is a 77 y.o. female with a past medical history of COPD, diabetes type 2, hyperlipidemia, hypertension presents today for evaluation of right arm numbness and shortness of breath.  Patient states around 10 AM today she started to have right arm numbness when she was getting dressed.  She denies any headache, vision changes, weakness or numbness in other extremities.  History of COPD reports having worsening shortness of breath today.  She reports using her inhaler once a day.  She denies any chest pain, shortness of breath.  HPI  Past Medical History:  Diagnosis Date   Carotid stenosis    1-39% bilateral stenosis   COPD (chronic obstructive pulmonary disease) (HCC)    Diabetes mellitus    Hyperlipidemia    Hypertension    Night sweats    Rectal polyp    Past Surgical History:  Procedure Laterality Date   ABDOMINAL HYSTERECTOMY  2000   fibroids; ovaries intact.   BACK SURGERY     plate put in back   CHOLECYSTECTOMY N/A 05/01/2017   Procedure: LAPAROSCOPIC CHOLECYSTECTOMY WITH INTRAOPERATIVE CHOLANGIOGRAM;  Surgeon: Donnie Mesa, MD;  Location: [place];  Service: General;  Laterality: N/A;   LEFT HEART CATH AND CORONARY ANGIOGRAPHY N/A 11/18/2020   Procedure: LEFT HEART CATH AND CORONARY ANGIOGRAPHY;  Surgeon: Troy Sine, MD;  Location: [place] [facility];  Service: Cardiovascular;  Laterality: N/A;   RECTAL POLYPECTOMY  05/16/2010   TVAdenoma polyp   TUBAL LIGATION       Home Medications Prior to Admission medications   Medication Sig Start Date End Date Taking? Authorizing Provider  ACCU-CHEK GUIDE test strip USE AS DIRECTED 08/27/21   Camillia Herter, NP  Accu-Chek Softclix Lancets lancets USE UP TO 4 TIMES DAILY AS  DIRECTED 07/30/21   Camillia Herter, NP   acetaminophen (TYLENOL) 500 MG tablet Take 500-1,000 mg by mouth daily as needed (pain).    [provider]  albuterol (VENTOLIN HFA) 108 (90 Base) MCG/ACT inhaler Inhale 2 puffs into the lungs every 6 (six) hours as needed for wheezing or shortness of breath. 01/31/21   Sherrilyn Rist A, MD  amLODipine (NORVASC) 5 MG tablet TAKE 1 TABLET BY MOUTH DAILY 01/03/22   Camillia Herter, NP  aspirin EC 81 MG tablet Take 81 mg by mouth daily. Swallow whole.    [provider]  blood glucose meter kit and supplies KIT Dispense based on patient and insurance preference. Use up to four times daily as directed. (FOR ICD-9 250.00, 250.01). 02/17/15   Wardell Honour, MD  blood glucose meter kit and supplies Dispense based on insurance preference. Use up to four times daily as directed. (FOR ICD-10 E11.9 E11.65) AccuCheck Meter     03/18/19   Forrest Moron, MD  Cholecalciferol (VITAMIN D3) 50 MCG (2000 UT) TABS Take 2,000 Units by mouth in the morning.    [provider]  diclofenac Sodium (VOLTAREN) 1 % GEL Apply 2 g topically at bedtime. Patient not taking: Reported on 12/01/2021 05/30/21   Camillia Herter, NP  Insulin Pen Needle (BD PEN NEEDLE NANO U/F) 32G X 4 MM MISC Inject 1 Dose into the skin daily. 05/30/21   Camillia Herter, NP  LANTUS SOLOSTAR 100  UNIT/ML Solostar Pen INJECT SUBCUTANEOUSLY 55 UNITS  EVERY NIGHT AT BEDTIME 12/26/21   Camillia Herter, NP  metoprolol succinate (TOPROL-XL) 25 MG 24 hr tablet TAKE 1 TABLET BY MOUTH ONCE  DAILY 02/02/22   Camillia Herter, NP  nystatin (MYCOSTATIN/NYSTOP) powder Apply 1 application topically 3 (three) times daily. Patient taking differently: Apply 1 application  topically in the morning and at bedtime. 08/31/20   Camillia Herter, NP  pravastatin (PRAVACHOL) 80 MG tablet TAKE 1 TABLET BY MOUTH DAILY 12/28/21   Sueanne Margarita, MD  sitaGLIPtin-metformin (JANUMET) 50-1000 MG tablet TAKE 1 TABLET BY MOUTH TWICE  DAILY WITH MEALS 11/29/21    Camillia Herter, NP  TRELEGY ELLIPTA 200-62.5-25 MCG/ACT AEPB USE 1 INHALATION BY MOUTH DAILY 07/11/21   Laurin Coder, MD      Allergies    Patient has no known allergies.    Review of Systems   Review of Systems Negative except as per HPI. Physical Exam Updated Vital Signs BP (!) 130/91 (BP Location: Right Arm)   Pulse 72   Temp 97.9 F (36.6 C) (Oral)   Resp 19   SpO2 96%  Physical Exam Vitals and nursing note reviewed.  Constitutional:      Appearance: Normal appearance.  HENT:     Head: Normocephalic and atraumatic.     Mouth/Throat:     Mouth: Mucous membranes are moist.  Eyes:     General: No scleral icterus. Cardiovascular:     Rate and Rhythm: Normal rate and regular rhythm.     Pulses: Normal pulses.     Heart sounds: Normal heart sounds.  Pulmonary:     Effort: Pulmonary effort is normal.     Breath sounds: Normal breath sounds.  Abdominal:     General: Abdomen is flat.     Palpations: Abdomen is soft.     Tenderness: There is no abdominal tenderness.  Musculoskeletal:        General: No deformity.  Skin:    General: Skin is warm.     Findings: No rash.  Neurological:     General: No focal deficit present.     Mental Status: She is alert.     Comments: Cranial nerves II through XII intact. Intact sensation to light touch in all 4 extremities. 5/5 strength in all 4 extremities. Intact finger-to-nose and heel-to-shin of all 4 extremities. No visual field cuts. No neglect noted. No aphasia noted.  Psychiatric:        Mood and Affect: Mood normal.     ED Results / Procedures / Treatments   Labs (all labs ordered are listed, but only abnormal results are displayed) Labs Reviewed  BASIC METABOLIC PANEL - Abnormal; Notable for the following components:      Result Value   Glucose, Bld 152 (*)    Creatinine, Ser 1.12 (*)    GFR, Estimated 51 (*)    All other components within normal limits  CBC - Abnormal; Notable for the following components:    RDW 16.3 (*)    All other components within normal limits  CBG MONITORING, ED - Abnormal; Notable for the following components:   Glucose-Capillary 105 (*)    All other components within normal limits  RESP PANEL BY RT-PCR (RSV, FLU A&B, COVID)  [id number]  CBG MONITORING, ED  TROPONIN I (HIGH SENSITIVITY)  TROPONIN I (HIGH SENSITIVITY)    EKG None  Radiology MR BRAIN WO CONTRAST  Result Date: 02/02/2022 CLINICAL DATA:  Acute neurologic deficits.  Right arm numbness. EXAM: MRI HEAD WITHOUT CONTRAST TECHNIQUE: Multiplanar, multiecho pulse sequences of the brain and surrounding structures were obtained without intravenous contrast. COMPARISON:  None Available. FINDINGS: Brain: No acute infarction, hemorrhage, hydrocephalus, extra-axial collection or mass lesion. No chronic microhemorrhage or siderosis. There is multifocal hyperintense T2-weighted signal within the periventricular and deep white matter. Normal CSF spaces and midline structures. Vascular: Normal flow voids Skull and upper cervical spine: Normal marrow signal. Sinuses/Orbits: Paranasal sinuses are clear. No mastoid effusion. Normal orbits. Other: None IMPRESSION: 1. No acute intracranial abnormality. 2. Findings of chronic microvascular ischemia. Electronically Signed   By: Ulyses Jarred M.D.   On: 02/02/2022 19:26   CT Head Wo Contrast  Result Date: 02/02/2022 CLINICAL DATA:  Neuro deficit, acute, stroke suspected EXAM: CT HEAD WITHOUT CONTRAST TECHNIQUE: Contiguous axial images were obtained from the base of the skull through the vertex without intravenous contrast. RADIATION DOSE REDUCTION: This exam was performed according to the departmental dose-optimization program which includes automated exposure control, adjustment of the mA and/or kV according to patient size and/or use of iterative reconstruction technique. COMPARISON:  None Available. FINDINGS: Brain: There is periventricular white matter decreased attenuation consistent  with small vessel ischemic changes. Gray-white differentiation is preserved. No acute intracranial hemorrhage, mass effect or shift. No hydrocephalus. Vascular: No hyperdense vessel or unexpected calcification. Skull: Normal. Negative for fracture or focal lesion. Sinuses/Orbits: No acute finding. IMPRESSION: Periventricular white matter changes consistent with chronic small vessel ischemia. No acute intracranial process identified. Electronically Signed   By: Sammie Bench M.D.   On: 02/02/2022 14:16   DG Chest 1 View  Result Date: 02/02/2022 CLINICAL DATA:  Shortness of breath.  Numbness. EXAM: CHEST  1 VIEW COMPARISON:  08/01/2020 FINDINGS: Atherosclerotic calcification of the aortic arch. Tapering of the peripheral pulmonary vasculature favors emphysema. Mild chronic interstitial accentuation. No blunting of the costophrenic angles. No airspace opacity is observed. IMPRESSION: 1. Aortic Atherosclerosis (ICD10-I70.0) and Emphysema (ICD10-J43.9). 2. Mild chronic interstitial accentuation. 3. No acute findings. Electronically Signed   By: Van Clines M.D.   On: 02/02/2022 13:25    Procedures Procedures    Medications Ordered in ED Medications - No data to display  ED Course/ Medical Decision Making/ A&P                             Medical Decision Making Amount and/or Complexity of Data Reviewed Labs: ordered. Radiology: ordered.   This patient presents to the ED for right arm numbness, this involves an extensive number of treatment options, and is a complaint that carries with a high risk of complications and morbidity.  The differential diagnosis includes ich/cva, neuropathy, radiculopathy, acs, musculoskeletal pain.  This is not an exhaustive list.  Lab tests: I ordered and personally interpreted labs.  The pertinent results include: WBC unremarkable. Hbg unremarkable. Platelets unremarkable. No electrolyte abnormalities noted. BUN, creatinine unremarkable. Troponin  negative  Imaging studies: I ordered imaging studies. I personally reviewed, interpreted imaging and agree with the radiologist's interpretations. The results include: negative for bleeding or infarction.   Problem list/ ED course/ Critical interventions/ Medical management: HPI: See above Vital signs within normal range and stable throughout visit. Laboratory/imaging studies significant for: See above. On physical examination, patient is afebrile and appears in no acute distress. She reports numbness on the R arm that started this moring. Neuroexam was otherwise unremarkable with no focal neuro deficits. CT head, MR  head were negative for bleeding and infraction. Low suspicion for acs as EKG showed no evidence of ischemia, troponin negative. I suspect musculoskeletal pain vs neuropathy. Symptoms have improved during observation in the ER. Advised patient to follow up with primary care for further evaluation and management, return to the ER if new or worsening symptoms. I have reviewed the patient home medicines and have made adjustments as needed.  Cardiac monitoring/EKG: The patient was maintained on a cardiac monitor.  I personally reviewed and interpreted the cardiac monitor which showed an underlying rhythm of: sinus rhythm.  Additional history obtained: External records from outside source obtained and reviewed including: Chart review including previous notes, labs, imaging.   Disposition Continued outpatient therapy. Follow-up with PCP recommended for reevaluation of symptoms. Treatment plan discussed with patient.  Pt acknowledged understanding was agreeable to the plan. Worrisome signs and symptoms were discussed with patient, and patient acknowledged understanding to return to the ED if they noticed these signs and symptoms. Patient was stable upon discharge.   This chart was dictated using voice recognition software.  Despite best efforts to proofread,  errors can occur which can  change the documentation meaning.          Final Clinical Impression(s) / ED Diagnoses Final diagnoses:  Right arm numbness    Rx / DC Orders ED Discharge Orders     None         Rex Kras, [place] 02/03/22 1109    Davonna Belling, MD 02/03/22 [phone]

## 2022-02-02 NOTE — ED Notes (Signed)
Patient verbalizes understanding of discharge instructions. Opportunity for questioning and answers were provided. Armband removed by staff, pt discharged from ED. Pt ambulatory to ED waiting room with steady gait.  

## 2022-02-02 NOTE — Telephone Encounter (Signed)
Requested medication (s) are due for refill today: no  Requested medication (s) are on the active medication list: yes  Last refill:  12/01/21 #90  Future visit scheduled: yes  Notes to clinic:  overdue lab work   Requested Prescriptions  Pending Prescriptions Disp Refills   lisinopril (ZESTRIL) 40 MG tablet [Pharmacy Med Name: Lisinopril 40 MG Oral Tablet] 90 tablet     Sig: TAKE 1 TABLET BY MOUTH DAILY     Cardiovascular:  ACE Inhibitors Failed - 02/01/2022 11:07 PM      Failed - Cr in normal range and within 180 days    Creat  Date Value Ref Range Status  08/31/2015 1.14 (H) 0.60 - 0.93 mg/dL Final    Comment:      For patients > or = 78 years of age: The upper reference limit for Creatinine is approximately 13% higher for people identified as African-American.      Creatinine, Ser  Date Value Ref Range Status  11/16/2020 0.92 0.57 - 1.00 mg/dL Final         Failed - K in normal range and within 180 days    Potassium  Date Value Ref Range Status  11/16/2020 5.1 3.5 - 5.2 mmol/L Final         Passed - Patient is not pregnant      Passed - Last BP in normal range    BP Readings from Last 1 Encounters:  10/27/21 137/82         Passed - Valid encounter within last 6 months    Recent Outpatient Visits           3 months ago Type 2 diabetes mellitus without complication, with long-term current use of insulin ([place])   Primary Care at [facility], Amy J, NP   4 months ago Type 2 diabetes mellitus without complication, with long-term current use of insulin [facility])   Primary Care at [facility], Amy J, NP   8 months ago Type 2 diabetes mellitus without complication, with long-term current use of insulin [facility])   [facility] at [facility], Amy J, NP   11 months ago Type 2 diabetes mellitus without complication, with long-term current use of insulin [facility])   Primary Care at [facility], Amy J, NP   1 year ago Type 2  diabetes mellitus without complication, with long-term current use of insulin [facility])   Primary Care at [facility] [place], NP       Future Appointments             In 1 week Camillia Herter, NP [facility] at [facility]            Signed Prescriptions Disp Refills   metoprolol succinate (TOPROL-XL) 25 MG 24 hr tablet 90 tablet 0    Sig: TAKE 1 TABLET BY MOUTH ONCE  DAILY     Cardiovascular:  Beta Blockers Passed - 02/01/2022 11:07 PM      Passed - Last BP in normal range    BP Readings from Last 1 Encounters:  10/27/21 137/82         Passed - Last Heart Rate in normal range    Pulse Readings from Last 1 Encounters:  10/27/21 90         Passed - Valid encounter within last 6 months    Recent Outpatient Visits           3 months ago Type  2 diabetes mellitus without complication, with long-term current use of insulin [facility])   Primary Care at [facility], Amy J, NP   4 months ago Type 2 diabetes mellitus without complication, with long-term current use of insulin [facility])   Primary Care at [facility], Amy J, NP   8 months ago Type 2 diabetes mellitus without complication, with long-term current use of insulin [facility])   Primary Care at [facility], Amy J, NP   11 months ago Type 2 diabetes mellitus without complication, with long-term current use of insulin [facility])   Primary Care at [facility], Amy J, NP   1 year ago Type 2 diabetes mellitus without complication, with long-term current use of insulin [facility])   Primary Care at [facility], Flonnie Hailstone, NP       Future Appointments             In 1 week Camillia Herter, NP Primary Care at [facility]

## 2022-02-07 NOTE — Progress Notes (Signed)
Patient ID: [name], female    DOB: 19-Oct-1945  MRN: [id number]  CC: Chronic Care Management   Subjective: [name] is a 77 y.o. female who presents for chronic care management.   Her concerns today include:  Since last visit doing well on Janumet for diabetes management. She is taking Lantus 50 units daily. Reports she is still having some low blood sugars. Recalls a recent blood sugar of 65. She was seen on 02/02/2022 at [facility] Emergency Department at [facility] for right arm numbness. States since then symptoms have overall improved but still comes and goes. She denies red flag symptoms.   Patient Active Problem List   Diagnosis Date Noted   Diarrhea 05/30/2021   Family history of malignant neoplasm of gastrointestinal tract 05/30/2021   Fecal urgency 05/30/2021   Hemorrhoids 05/30/2021   Personal history of colonic polyps 05/30/2021   Rectal bleeding 05/30/2021   Carotid stenosis 12/01/2020   CAD in native artery    DOE (dyspnea on exertion)    Diastolic dysfunction 27/25/3664   Cyst of right ovary 07/28/2017   Pure hypercholesterolemia 09/10/2016   Thyroid nodule 09/10/2016   COPD (chronic obstructive pulmonary disease) ([place]) 09/10/2016   Family history of colon cancer 06/06/2016   Nuclear sclerotic cataract of both eyes 04/08/2013   Type 2 diabetes mellitus without complications ([place]) 40/34/7425   Essential hypertension 09/13/2011   Tobacco user 09/13/2011   DDD (degenerative disc disease), lumbar 09/13/2011   History of rectal polypectomy 08/31/2010     Current Outpatient Medications on File Prior to Visit  Medication Sig Dispense Refill   lisinopril (ZESTRIL) 40 MG tablet Take 40 mg by mouth daily.     ACCU-CHEK GUIDE test strip USE AS DIRECTED 400 strip 3   Accu-Chek Softclix Lancets lancets USE UP TO 4 TIMES DAILY AS  DIRECTED 200 each 6   acetaminophen (TYLENOL) 500 MG tablet Take 500-1,000 mg by mouth daily as needed (pain).     albuterol  (VENTOLIN HFA) 108 (90 Base) MCG/ACT inhaler Inhale 2 puffs into the lungs every 6 (six) hours as needed for wheezing or shortness of breath. 3 each 3   amLODipine (NORVASC) 5 MG tablet TAKE 1 TABLET BY MOUTH DAILY 90 tablet 0   aspirin EC 81 MG tablet Take 81 mg by mouth daily. Swallow whole.     blood glucose meter kit and supplies KIT Dispense based on patient and insurance preference. Use up to four times daily as directed. (FOR ICD-9 250.00, 250.01). 1 each 0   blood glucose meter kit and supplies Dispense based on insurance preference. Use up to four times daily as directed. (FOR ICD-10 E11.9 E11.65) AccuCheck Meter     1 each 0   Cholecalciferol (VITAMIN D3) 50 MCG (2000 UT) TABS Take 2,000 Units by mouth in the morning.     Insulin Pen Needle (BD PEN NEEDLE NANO U/F) 32G X 4 MM MISC Inject 1 Dose into the skin daily. 100 each 0   metoprolol succinate (TOPROL-XL) 25 MG 24 hr tablet TAKE 1 TABLET BY MOUTH ONCE  DAILY 90 tablet 0   pravastatin (PRAVACHOL) 80 MG tablet TAKE 1 TABLET BY MOUTH DAILY 15 tablet 0   TRELEGY ELLIPTA 200-62.5-25 MCG/ACT AEPB USE 1 INHALATION BY MOUTH DAILY 180 each 3   Current Facility-Administered Medications on File Prior to Visit  Medication Dose Route Frequency Provider Last Rate Last Admin   sodium chloride flush (NS) 0.9 % injection 3  mL  3 mL Intravenous Q12H Dunn, Dayna N, PA-C        No Known Allergies  Social History   Socioeconomic History   Marital status: Divorced    Spouse name: Not on file   Number of children: 3   Years of education: Not on file   Highest education level: Not on file  Occupational History   Occupation: retired    Comment: [employer]  Tobacco Use   Smoking status: Every Day    Packs/day: 2.50    Years: 60.00    Total pack years: 150.00    Types: Cigarettes    Passive exposure: Current   Smokeless tobacco: Never   Tobacco comments:    1 ppd  Vaping Use   Vaping Use: Never used  Substance and Sexual  Activity   Alcohol use: No   Drug use: No   Sexual activity: Not on file  Other Topics Concern   Not on file  Social History Narrative   Marital status: divorced; not dating in 2019 but interested slightly.        Children:  3 children; 7 seven grandchildren; 1 gg      Lives: with oldest daughter, 2 grandchildren, 56 gg, 1 friend of daughters, daughter's boyfriend.       Employment: retired [employer] age 77.      Tobacco; 1ppd x 50 years.  Not interested in 2019.      Alcohol: none      Exercise: none in 2019[place]      ADLs:   independent with ADLs; no assistant devices      Advanced Directives:  None; DNR/DNI; no HCPOA.    Social Determinants of Health   Financial Resource Strain: Low Risk  (12/01/2021)   Overall Financial Resource Strain (CARDIA)    Difficulty of Paying Living Expenses: Not hard at all  Food Insecurity: No Food Insecurity (12/01/2021)   Hunger Vital Sign    Worried About Running Out of Food in the Last Year: Never true    Ran Out of Food in the Last Year: Never true  Transportation Needs: No Transportation Needs (12/01/2021)   PRAPARE - Hydrologist (Medical): No    Lack of Transportation (Non-Medical): No  Physical Activity: Inactive (12/01/2021)   Exercise Vital Sign    Days of Exercise per Week: 0 days    Minutes of Exercise per Session: 0 min  Stress: No Stress Concern Present (12/01/2021)   [place]    Feeling of Stress : Not at all  Social Connections: Moderately Isolated (12/01/2021)   Social Connection and Isolation Panel [NHANES]    Frequency of Communication with Friends and Family: More than three times a week    Frequency of Social Gatherings with Friends and Family: Once a week    Attends Religious Services: 1 to 4 times per year    Active Member of [employer] or Organizations: No    Attends Archivist Meetings:  Never    Marital Status: Divorced  Human resources officer Violence: Not At Risk (12/01/2021)   Humiliation, Afraid, Rape, and Kick questionnaire    Fear of Current or Ex-Partner: No    Emotionally Abused: No    Physically Abused: No    Sexually Abused: No    Family History  Problem Relation Age of Onset   Diabetes Sister    Diabetes Brother  Hypertension Brother    Cancer Mother 71       colon(age 63) and lung (age 32)   Diabetes Mother    Heart disease Mother 66       CABG   Hyperlipidemia Mother    Cancer Father 41       stomach   Diabetes Maternal Grandmother    Mental illness Sister     Past Surgical History:  Procedure Laterality Date   ABDOMINAL HYSTERECTOMY  2000   fibroids; ovaries intact.   BACK SURGERY     plate put in back   CHOLECYSTECTOMY N/A 05/01/2017   Procedure: LAPAROSCOPIC CHOLECYSTECTOMY WITH INTRAOPERATIVE CHOLANGIOGRAM;  Surgeon: Donnie Mesa, MD;  Location: [place];  Service: General;  Laterality: N/A;   LEFT HEART CATH AND CORONARY ANGIOGRAPHY N/A 11/18/2020   Procedure: LEFT HEART CATH AND CORONARY ANGIOGRAPHY;  Surgeon: Troy Sine, MD;  Location: [place] [facility];  Service: Cardiovascular;  Laterality: N/A;   RECTAL POLYPECTOMY  05/16/2010   TVAdenoma polyp   TUBAL LIGATION      ROS: Review of Systems Negative except as stated above  PHYSICAL EXAM: BP 96/71   Pulse 85   Temp 98.3 F (36.8 C)   Resp 16   Wt 170 lb (77.1 kg)   SpO2 92%   BMI 26.63 kg/m   Physical Exam HENT:     Head: Normocephalic and atraumatic.  Eyes:     Extraocular Movements: Extraocular movements intact.     Conjunctiva/sclera: Conjunctivae normal.     Pupils: Pupils are equal, round, and reactive to light.  Cardiovascular:     Rate and Rhythm: Normal rate and regular rhythm.     Pulses: Normal pulses.     Heart sounds: Normal heart sounds.  Pulmonary:     Effort: Pulmonary effort is normal.     Breath sounds: Normal breath sounds.  Musculoskeletal:      Right shoulder: Normal.     Left shoulder: Normal.     Right upper arm: Normal.     Left upper arm: Normal.     Right elbow: Normal.     Left elbow: Normal.     Right forearm: Normal.     Left forearm: Normal.     Right wrist: Normal.     Left wrist: Normal.     Right hand: Normal.     Left hand: Normal.     Cervical back: Normal range of motion and neck supple.  Neurological:     General: No focal deficit present.     Mental Status: She is alert and oriented to person, place, and time.  Psychiatric:        Mood and Affect: Mood normal.        Behavior: Behavior normal.    Results for orders placed or performed in visit on 02/09/22  POCT glycosylated hemoglobin (Hb A1C)  Result Value Ref Range   Hemoglobin A1C 7.7 (A) 4.0 - 5.6 %   HbA1c POC (<> result, manual entry)     HbA1c, POC (prediabetic range)     HbA1c, POC (controlled diabetic range)       ASSESSMENT AND PLAN: 1. Type 2 diabetes mellitus without complication, with long-term current use of insulin (HCC) - Hemoglobin A1c at goal at 7.7%, goal 8%. This is improved from previous 7.9%. - Continue Sitagliptin-Metformin as prescribed.  - Patient reports she is still having some episodes of low blood sugars. Decrease Insulin Glargine from 50 units daily at  bedtime to 45 units daily at bedtime.  - Discussed the importance of healthy eating habits, low-carbohydrate diet, low-sugar diet, regular aerobic exercise (at least 150 minutes a week as tolerated) and medication compliance to achieve or maintain control of diabetes. - Follow-up with primary provider in 3 months or sooner if needed. Patient aware to follow-up with me sooner if she continues to experience low blood sugars and she verbalized understanding. - POCT glycosylated hemoglobin (Hb A1C) - sitaGLIPtin-metformin (JANUMET) 50-1000 MG tablet; Take 1 tablet by mouth 2 (two) times daily with a meal.  Dispense: 60 tablet; Refill: 2 - insulin glargine (LANTUS SOLOSTAR)  100 UNIT/ML Solostar Pen; Inject 45 Units into the skin at bedtime.  Dispense: 15 mL; Refill: 2  2. History of thyroid nodule 3. Thyroid disorder screen - Routine screening.  - Referral to Endocrinology for further evaluation/management. - TSH - Ambulatory referral to Endocrinology  4. Right arm numbness - Patient denies red flag symptoms.  - Patient declined referral to Neurology for further evaluation/management. Patient aware to notify me if she would like referral in the future and she verbalized understanding.   Patient was given the opportunity to ask questions.  Patient verbalized understanding of the plan and was able to repeat key elements of the plan. Patient was given clear instructions to go to Emergency Department or return to medical center if symptoms don't improve, worsen, or new problems develop.The patient verbalized understanding.   Orders Placed This Encounter  Procedures   TSH   Ambulatory referral to Endocrinology   POCT glycosylated hemoglobin (Hb A1C)     Requested Prescriptions   Signed Prescriptions Disp Refills   sitaGLIPtin-metformin (JANUMET) 50-1000 MG tablet 60 tablet 2    Sig: Take 1 tablet by mouth 2 (two) times daily with a meal.   insulin glargine (LANTUS SOLOSTAR) 100 UNIT/ML Solostar Pen 15 mL 2    Sig: Inject 45 Units into the skin at bedtime.    Return in about 3 months (around 05/11/2022) for Follow-Up or next available chronic care mgmt .  Camillia Herter, NP

## 2022-02-09 ENCOUNTER — Encounter: Payer: Self-pay | Admitting: Family

## 2022-02-09 ENCOUNTER — Ambulatory Visit (INDEPENDENT_AMBULATORY_CARE_PROVIDER_SITE_OTHER): Payer: Medicare Other | Admitting: Family

## 2022-02-09 VITALS — BP 96/71 | HR 85 | Temp 98.3°F | Resp 16 | Wt 170.0 lb

## 2022-02-09 DIAGNOSIS — Z1329 Encounter for screening for other suspected endocrine disorder: Secondary | ICD-10-CM | POA: Diagnosis not present

## 2022-02-09 DIAGNOSIS — E119 Type 2 diabetes mellitus without complications: Secondary | ICD-10-CM | POA: Diagnosis not present

## 2022-02-09 DIAGNOSIS — Z8639 Personal history of other endocrine, nutritional and metabolic disease: Secondary | ICD-10-CM | POA: Diagnosis not present

## 2022-02-09 DIAGNOSIS — Z794 Long term (current) use of insulin: Secondary | ICD-10-CM | POA: Diagnosis not present

## 2022-02-09 DIAGNOSIS — R200 Anesthesia of skin: Secondary | ICD-10-CM | POA: Diagnosis not present

## 2022-02-09 LAB — POCT GLYCOSYLATED HEMOGLOBIN (HGB A1C): Hemoglobin A1C: 7.7 % — AB (ref 4.0–5.6)

## 2022-02-09 MED ORDER — JANUMET 50-1000 MG PO TABS: 1.0000 | ORAL_TABLET | Freq: Two times a day (BID) | ORAL | 2 refills | Status: DC

## 2022-02-09 MED ORDER — LANTUS SOLOSTAR 100 UNIT/ML ~~LOC~~ SOPN: 45.0000 [IU] | PEN_INJECTOR | Freq: Every day | SUBCUTANEOUS | 2 refills | Status: DC

## 2022-02-09 NOTE — Progress Notes (Signed)
.  Pt presents for chronic care management

## 2022-02-10 LAB — TSH: TSH: 1.09 u[IU]/mL (ref 0.450–4.500)

## 2022-02-15 ENCOUNTER — Other Ambulatory Visit: Payer: Self-pay | Admitting: Family

## 2022-02-15 DIAGNOSIS — E119 Type 2 diabetes mellitus without complications: Secondary | ICD-10-CM

## 2022-02-15 NOTE — Telephone Encounter (Signed)
Medication Refill - Medication: Insulin Pen Needle (BD PEN NEEDLE NANO U/F) 32G X 4 MM MISC   Has the patient contacted their pharmacy? No.  Preferred Pharmacy (with phone number or street name): [place], [place] Phone: [phone]  Fax: [phone]   Has the patient been seen for an appointment in the last year OR does the patient have an upcoming appointment? Yes.    Agent: Please be advised that RX refills may take up to 3 business days. We ask that you follow-up with your pharmacy.

## 2022-02-16 MED ORDER — BD PEN NEEDLE NANO U/F 32G X 4 MM MISC: 1.0000 | Freq: Every day | 3 refills | Status: AC

## 2022-02-16 NOTE — Telephone Encounter (Signed)
Requested Prescriptions  Pending Prescriptions Disp Refills   Insulin Pen Needle (BD PEN NEEDLE NANO U/F) 32G X 4 MM MISC 100 each 3    Sig: Inject 1 Dose into the skin daily.     Endocrinology: Diabetes - Testing Supplies Passed - 02/15/2022  5:02 PM      Passed - Valid encounter within last 12 months    Recent Outpatient Visits           1 week ago Type 2 diabetes mellitus without complication, with long-term current use of insulin ([place])   [place] [facility], Amy J, NP   3 months ago Type 2 diabetes mellitus without complication, with long-term current use of insulin ([place])   [place] [facility] at [facility], Amy J, NP   4 months ago Type 2 diabetes mellitus without complication, with long-term current use of insulin ([place])   [place] [facility] at [facility], Amy J, NP   8 months ago Type 2 diabetes mellitus without complication, with long-term current use of insulin ([place])   [place] [facility] at [facility], Amy J, NP   11 months ago Type 2 diabetes mellitus without complication, with long-term current use of insulin [facility])   [place] [facility] at [facility], [place], NP       Future Appointments             In 3 months Camillia Herter, NP [facility] at [facility]

## 2022-03-07 ENCOUNTER — Other Ambulatory Visit: Payer: Self-pay | Admitting: Family

## 2022-03-07 DIAGNOSIS — I10 Essential (primary) hypertension: Secondary | ICD-10-CM

## 2022-03-07 NOTE — Telephone Encounter (Signed)
Please call patient to confirm name of her cardiologist. Amlodipine refills should be managed by the same. Order complete per patient request.

## 2022-04-05 ENCOUNTER — Other Ambulatory Visit: Payer: Self-pay | Admitting: Family

## 2022-04-05 DIAGNOSIS — I10 Essential (primary) hypertension: Secondary | ICD-10-CM

## 2022-04-06 NOTE — Telephone Encounter (Signed)
Requested Prescriptions  Pending Prescriptions Disp Refills   metoprolol succinate (TOPROL-XL) 25 MG 24 hr tablet [Pharmacy Med Name: Metoprolol Succinate ER 25 MG Oral Tablet Extended Release 24 Hour] 90 tablet 1    Sig: TAKE 1 TABLET BY MOUTH ONCE  DAILY     Cardiovascular:  Beta Blockers Passed - 04/05/2022 10:23 PM      Passed - Last BP in normal range    BP Readings from Last 1 Encounters:  02/09/22 96/71         Passed - Last Heart Rate in normal range    Pulse Readings from Last 1 Encounters:  02/09/22 85         Passed - Valid encounter within last 6 months    Recent Outpatient Visits           1 month ago Type 2 diabetes mellitus without complication, with long-term current use of insulin ([place])   [place] [facility] at [facility], Amy J, NP   5 months ago Type 2 diabetes mellitus without complication, with long-term current use of insulin ([place])   [place] [facility] at [facility], Amy J, NP   6 months ago Type 2 diabetes mellitus without complication, with long-term current use of insulin ([place])   [place] [facility] at [facility], Amy J, NP   10 months ago Type 2 diabetes mellitus without complication, with long-term current use of insulin ([place])   [place] [facility] at [facility], Amy J, NP   1 year ago Type 2 diabetes mellitus without complication, with long-term current use of insulin [facility])   [place] [facility] at [facility], [place], NP       Future Appointments             In 1 month Camillia Herter, NP [place] at [facility]

## 2022-04-15 ENCOUNTER — Other Ambulatory Visit: Payer: Self-pay | Admitting: Family

## 2022-04-15 DIAGNOSIS — E119 Type 2 diabetes mellitus without complications: Secondary | ICD-10-CM

## 2022-05-09 ENCOUNTER — Other Ambulatory Visit: Payer: Self-pay | Admitting: Family

## 2022-05-09 DIAGNOSIS — I10 Essential (primary) hypertension: Secondary | ICD-10-CM

## 2022-05-09 NOTE — Telephone Encounter (Signed)
Requested Prescriptions  Pending Prescriptions Disp Refills   amLODipine (NORVASC) 5 MG tablet [Pharmacy Med Name: amLODIPine Besylate 5 MG Oral Tablet] 90 tablet 0    Sig: TAKE 1 TABLET BY MOUTH DAILY     Cardiovascular: Calcium Channel Blockers 2 Passed - 05/09/2022  6:31 AM      Passed - Last BP in normal range    BP Readings from Last 1 Encounters:  02/09/22 96/71         Passed - Last Heart Rate in normal range    Pulse Readings from Last 1 Encounters:  02/09/22 85         Passed - Valid encounter within last 6 months    Recent Outpatient Visits           2 months ago Type 2 diabetes mellitus without complication, with long-term current use of insulin (HCC)   [place] [facility] at [facility], Amy J, NP   6 months ago Type 2 diabetes mellitus without complication, with long-term current use of insulin (HCC)   [place] [facility] at [facility], Amy J, NP   7 months ago Type 2 diabetes mellitus without complication, with long-term current use of insulin (HCC)   [place] [facility] at [facility], Amy J, NP   11 months ago Type 2 diabetes mellitus without complication, with long-term current use of insulin (HCC)   [place] [facility] at [facility], Amy J, NP   1 year ago Type 2 diabetes mellitus without complication, with long-term current use of insulin [facility])   [place] [facility] at [facility], [place], NP       Future Appointments             In 1 week Rema Fendt, NP [facility] at [facility]

## 2022-05-09 NOTE — Progress Notes (Signed)
Patient ID: [name], female    DOB: 1945/10/11  MRN: [id number]  CC: Chronic Care Management   Subjective: [name] is a 77 y.o. female who presents for chronic care management.   Her concerns today include:  - Doing well on diabetes medications, no issues/concerns. She denies red flag symptoms associated with diabetes. Home blood sugars stable.  - Reports she has not followed up with Cardiology in over 1 year. Doing well on blood pressure and cholesterol medications. She denies red flag symptoms such as but not limited to chest pain, shortness of breath, worst headache of life, nausea/vomiting.  - Established with Podiatry.  - Established with Penni Bombard, MD at [facility]. Reports she recently had a procedure/right groin clip placement and taking Clopidogrel.   Patient Active Problem List   Diagnosis Date Noted   Diarrhea 05/30/2021   Family history of malignant neoplasm of gastrointestinal tract 05/30/2021   Fecal urgency 05/30/2021   Hemorrhoids 05/30/2021   Personal history of colonic polyps 05/30/2021   Rectal bleeding 05/30/2021   Carotid stenosis 12/01/2020   CAD in native artery    DOE (dyspnea on exertion)    Diastolic dysfunction 07/22/2020   Cyst of right ovary 07/28/2017   Pure hypercholesterolemia 09/10/2016   Thyroid nodule 09/10/2016   COPD (chronic obstructive pulmonary disease) (HCC) 09/10/2016   Family history of colon cancer 06/06/2016   Nuclear sclerotic cataract of both eyes 04/08/2013   Type 2 diabetes mellitus without complications (HCC) 09/13/2011   Essential hypertension 09/13/2011   Tobacco user 09/13/2011   DDD (degenerative disc disease), lumbar 09/13/2011   History of rectal polypectomy 08/31/2010     Current Outpatient Medications on File Prior to Visit  Medication Sig Dispense Refill   ACCU-CHEK GUIDE test strip USE AS DIRECTED 400 strip 3   Accu-Chek Softclix Lancets lancets USE UP TO 4 TIMES DAILY AS  DIRECTED 200 each 6    acetaminophen (TYLENOL) 500 MG tablet Take 500-1,000 mg by mouth daily as needed (pain).     albuterol (VENTOLIN HFA) 108 (90 Base) MCG/ACT inhaler Inhale 2 puffs into the lungs every 6 (six) hours as needed for wheezing or shortness of breath. 3 each 3   amLODipine (NORVASC) 5 MG tablet TAKE 1 TABLET BY MOUTH DAILY 90 tablet 0   aspirin EC 81 MG tablet Take 81 mg by mouth daily. Swallow whole.     blood glucose meter kit and supplies KIT Dispense based on patient and insurance preference. Use up to four times daily as directed. (FOR ICD-9 250.00, 250.01). 1 each 0   blood glucose meter kit and supplies Dispense based on insurance preference. Use up to four times daily as directed. (FOR ICD-10 E11.9 E11.65) AccuCheck Meter     1 each 0   Cholecalciferol (VITAMIN D3) 50 MCG (2000 UT) TABS Take 2,000 Units by mouth in the morning.     insulin glargine (LANTUS SOLOSTAR) 100 UNIT/ML Solostar Pen Inject 45 Units into the skin at bedtime. 15 mL 2   Insulin Pen Needle (BD PEN NEEDLE NANO U/F) 32G X 4 MM MISC Inject 1 Dose into the skin daily. 100 each 3   JANUMET 50-1000 MG tablet TAKE 1 TABLET BY MOUTH TWICE  DAILY WITH MEALS 180 tablet 3   metoprolol succinate (TOPROL-XL) 25 MG 24 hr tablet TAKE 1 TABLET BY MOUTH ONCE  DAILY 90 tablet 1   TRELEGY ELLIPTA 200-62.5-25 MCG/ACT AEPB USE 1 INHALATION BY MOUTH DAILY 180 each  3   Current Facility-Administered Medications on File Prior to Visit  Medication Dose Route Frequency Provider Last Rate Last Admin   sodium chloride flush (NS) 0.9 % injection 3 mL  3 mL Intravenous Q12H Dunn, Dayna N, PA-C        No Known Allergies  Social History   Socioeconomic History   Marital status: Divorced    Spouse name: Not on file   Number of children: 3   Years of education: Not on file   Highest education level: GED or equivalent  Occupational History   Occupation: retired    Comment: [employer]  Tobacco Use   Smoking status: Every Day    Packs/day:  2.50    Years: 60.00    Additional pack years: 0.00    Total pack years: 150.00    Types: Cigarettes    Passive exposure: Current   Smokeless tobacco: Never   Tobacco comments:    1 ppd  Vaping Use   Vaping Use: Never used  Substance and Sexual Activity   Alcohol use: No   Drug use: No   Sexual activity: Not on file  Other Topics Concern   Not on file  Social History Narrative   Marital status: divorced; not dating in 2019 but interested slightly.        Children:  3 children; 7 seven grandchildren; 1 gg      Lives: with oldest daughter, 2 grandchildren, 1 gg, 1 friend of daughters, daughter's boyfriend.       Employment: retired [name] age 28.      Tobacco; 1ppd x 50 years.  Not interested in 2019.      Alcohol: none      Exercise: none in 2019. [place]      ADLs:   independent with ADLs; no assistant devices      Advanced Directives:  None; DNR/DNI; no HCPOA.    Social Determinants of Health   Financial Resource Strain: Medium Risk (05/16/2022)   Overall Financial Resource Strain (CARDIA)    Difficulty of Paying Living Expenses: Somewhat hard  Food Insecurity: Food Insecurity Present (05/16/2022)   Hunger Vital Sign    Worried About Running Out of Food in the Last Year: Sometimes true    Ran Out of Food in the Last Year: Patient declined  Transportation Needs: No Transportation Needs (05/16/2022)   PRAPARE - Administrator, Civil Service (Medical): No    Lack of Transportation (Non-Medical): No  Physical Activity: Inactive (05/16/2022)   Exercise Vital Sign    Days of Exercise per Week: 0 days    Minutes of Exercise per Session: 0 min  Stress: No Stress Concern Present (05/16/2022)   [employer] of Occupational Health - Occupational Stress Questionnaire    Feeling of Stress : Not at all  Social Connections: Unknown (05/16/2022)   Social Connection and Isolation Panel [NHANES]    Frequency of Communication with Friends and Family:  More than three times a week    Frequency of Social Gatherings with Friends and Family: Patient declined    Attends Religious Services: Patient declined    Database administrator or Organizations: No    Attends Banker Meetings: Never    Marital Status: Divorced  Catering manager Violence: Not At Risk (12/01/2021)   Humiliation, Afraid, Rape, and Kick questionnaire    Fear of Current or Ex-Partner: No    Emotionally Abused: No  Physically Abused: No    Sexually Abused: No    Family History  Problem Relation Age of Onset   Diabetes Sister    Diabetes Brother    Hypertension Brother    Cancer Mother 42       colon(age 8) and lung (age 74)   Diabetes Mother    Heart disease Mother 40       CABG   Hyperlipidemia Mother    Cancer Father 34       stomach   Diabetes Maternal Grandmother    Mental illness Sister     Past Surgical History:  Procedure Laterality Date   ABDOMINAL HYSTERECTOMY  2000   fibroids; ovaries intact.   BACK SURGERY     plate put in back   CHOLECYSTECTOMY N/A 05/01/2017   Procedure: LAPAROSCOPIC CHOLECYSTECTOMY WITH INTRAOPERATIVE CHOLANGIOGRAM;  Surgeon: Manus Rudd, MD;  [facility]: [facility];  Service: General;  Laterality: N/A;   LEFT HEART CATH AND CORONARY ANGIOGRAPHY N/A 11/18/2020   Procedure: LEFT HEART CATH AND CORONARY ANGIOGRAPHY;  Surgeon: Lennette Bihari, MD;  [facility];  Service: Cardiovascular;  Laterality: N/A;   RECTAL POLYPECTOMY  05/16/2010   TVAdenoma polyp   TUBAL LIGATION      ROS: Review of Systems Negative except as stated above  PHYSICAL EXAM: BP 129/81   Pulse 100   Resp 16   Wt 174 lb 3.2 oz (79 kg)   SpO2 95%   BMI 27.28 kg/m   Physical Exam HENT:     Head: Normocephalic and atraumatic.  Eyes:     Extraocular Movements: Extraocular movements intact.     Conjunctiva/sclera: Conjunctivae normal.     Pupils: Pupils are equal, round, and reactive to light.  Cardiovascular:     Rate  and Rhythm: Normal rate and regular rhythm.     Pulses: Normal pulses.     Heart sounds: Normal heart sounds.  Pulmonary:     Effort: Pulmonary effort is normal.     Breath sounds: Normal breath sounds.  Musculoskeletal:     Cervical back: Normal range of motion and neck supple.  Neurological:     General: No focal deficit present.     Mental Status: She is alert and oriented to person, place, and time.  Psychiatric:        Mood and Affect: Mood normal.        Behavior: Behavior normal.     ASSESSMENT AND PLAN: 1. Type 2 diabetes mellitus without complication, with long-term current use of insulin (HCC) - Routine screening.  - Continue Janumet as prescribed. No refills needed as of present.  - Continue Insulin Glargine as prescribed. Awaiting hemoglobin A1c result to see if dose needs adjusting. - Discussed the importance of healthy eating habits, low-carbohydrate diet, low-sugar diet, regular aerobic exercise (at least 150 minutes a week as tolerated) and medication compliance to achieve or maintain control of diabetes. - Follow-up with primary provider as scheduled. - Hemoglobin A1c  2. Primary hypertension - Continue Amlodipine as prescribed. No refills needed as of present.  - Continue Metoprolol Succinate as prescribed. No refills needed as of present.  - Continue Lisinopril as prescribed.  - Counseled on blood pressure goal of less than 140/90, low-sodium, DASH diet, medication compliance, 150 minutes of moderate intensity exercise per week as tolerated. Discussed medication compliance, adverse effects. - Referral to Cardiology for further evaluation/management. During the interim follow-up with primary provider as scheduled. - lisinopril (ZESTRIL) 40 MG tablet;  Take 1 tablet (40 mg total) by mouth daily.  Dispense: 90 tablet; Refill: 0 - Ambulatory referral to Cardiology  3. Pure hypercholesterolemia 4. CAD in native artery - Continue Pravastatin as prescribed. - Referral  to Cardiology for further evaluation/management. During the interim follow-up with primary provider as scheduled. - pravastatin (PRAVACHOL) 80 MG tablet; Take 1 tablet (80 mg total) by mouth daily.  Dispense: 90 tablet; Refill: 0 - Ambulatory referral to Cardiology  5. Nonrheumatic aortic (valve) insufficiency - Referral to Cardiology for further evaluation/management.  - Ambulatory referral to Cardiology   Patient was given the opportunity to ask questions.  Patient verbalized understanding of the plan and was able to repeat key elements of the plan. Patient was given clear instructions to go to Emergency Department or return to medical center if symptoms don't improve, worsen, or new problems develop.The patient verbalized understanding.   Orders Placed This Encounter  Procedures   Hemoglobin A1c   Ambulatory referral to Cardiology     Requested Prescriptions   Signed Prescriptions Disp Refills   pravastatin (PRAVACHOL) 80 MG tablet 90 tablet 0    Sig: Take 1 tablet (80 mg total) by mouth daily.   lisinopril (ZESTRIL) 40 MG tablet 90 tablet 0    Sig: Take 1 tablet (40 mg total) by mouth daily.    Follow-up with primary provider as scheduled.   Rema Fendt, NP

## 2022-05-17 ENCOUNTER — Ambulatory Visit (INDEPENDENT_AMBULATORY_CARE_PROVIDER_SITE_OTHER): Payer: Medicare Other | Admitting: Family

## 2022-05-17 ENCOUNTER — Encounter: Payer: Self-pay | Admitting: Family

## 2022-05-17 VITALS — BP 129/81 | HR 100 | Resp 16 | Wt 174.2 lb

## 2022-05-17 DIAGNOSIS — I2510 Atherosclerotic heart disease of native coronary artery without angina pectoris: Secondary | ICD-10-CM | POA: Diagnosis not present

## 2022-05-17 DIAGNOSIS — I10 Essential (primary) hypertension: Secondary | ICD-10-CM

## 2022-05-17 DIAGNOSIS — E119 Type 2 diabetes mellitus without complications: Secondary | ICD-10-CM

## 2022-05-17 DIAGNOSIS — I351 Nonrheumatic aortic (valve) insufficiency: Secondary | ICD-10-CM

## 2022-05-17 DIAGNOSIS — E7800 Pure hypercholesterolemia, unspecified: Secondary | ICD-10-CM

## 2022-05-17 DIAGNOSIS — Z794 Long term (current) use of insulin: Secondary | ICD-10-CM

## 2022-05-17 MED ORDER — LISINOPRIL 40 MG PO TABS: 40.0000 mg | ORAL_TABLET | Freq: Every day | ORAL | 0 refills | Status: DC

## 2022-05-17 MED ORDER — PRAVASTATIN SODIUM 80 MG PO TABS: 80.0000 mg | ORAL_TABLET | Freq: Every day | ORAL | 0 refills | Status: DC

## 2022-05-18 ENCOUNTER — Other Ambulatory Visit: Payer: Self-pay | Admitting: Family

## 2022-05-18 DIAGNOSIS — Z794 Long term (current) use of insulin: Secondary | ICD-10-CM

## 2022-05-18 LAB — HEMOGLOBIN A1C
Est. average glucose Bld gHb Est-mCnc: 206 mg/dL
Hgb A1c MFr Bld: 8.8 % — ABNORMAL HIGH (ref 4.8–5.6)

## 2022-05-18 MED ORDER — LANTUS SOLOSTAR 100 UNIT/ML ~~LOC~~ SOPN: 45.0000 [IU] | PEN_INJECTOR | Freq: Every day | SUBCUTANEOUS | 2 refills | Status: AC

## 2022-05-18 MED ORDER — DAPAGLIFLOZIN PROPANEDIOL 5 MG PO TABS: 5.0000 mg | ORAL_TABLET | Freq: Every day | ORAL | 0 refills | Status: DC

## 2022-05-18 MED ORDER — JANUMET 50-1000 MG PO TABS: 1.0000 | ORAL_TABLET | Freq: Two times a day (BID) | ORAL | 0 refills | Status: DC

## 2022-05-23 ENCOUNTER — Telehealth: Payer: Self-pay | Admitting: Family

## 2022-05-23 NOTE — Telephone Encounter (Signed)
Copied from CRM [id number]. Topic: General - Other >> May 22, 2022  3:05 PM [name] wrote: Reason for CRM: The patient would like to be contacted by a member of clinical staff to review their dapagliflozin propanediol (FARXIGA) 5 MG TABS tablet [[id number]] prescription   The patient has declined to take the medication and has requested to speak directly with their PCP regarding their Rx concern   Please contact when possible

## 2022-05-23 NOTE — Telephone Encounter (Signed)
Please call patient to schedule an appointment to discuss in detail medication regimen. Thank you.

## 2022-05-25 NOTE — Progress Notes (Unsigned)
Patient ID: BRITTANY WILLCUT, female    DOB: 27-Mar-1945  MRN: [id number]  CC: Discuss Medication  Subjective: [name] is a 77 y.o. female who presents for discuss medication.  Her concerns today include:  Reports she does not want to begin [place] for diabetes management. Reports in the past the same caused her blood sugars to become low. Also, reports she does not want to begin any alternative medication for Farxiga for diabetes management. States "I am already taking too many medications". She is taking Janumet and Insulin Glargine as prescribed. Home blood sugar this morning 143. She is trying to monitor what she eats. Exercise limited by COPD (established with Pulmonology). No further issues/concerns for discussion today.   Patient Active Problem List   Diagnosis Date Noted   Diarrhea 05/30/2021   Family history of malignant neoplasm of gastrointestinal tract 05/30/2021   Fecal urgency 05/30/2021   Hemorrhoids 05/30/2021   Personal history of colonic polyps 05/30/2021   Rectal bleeding 05/30/2021   Carotid stenosis 12/01/2020   CAD in native artery    DOE (dyspnea on exertion)    Diastolic dysfunction 07/22/2020   Cyst of right ovary 07/28/2017   Pure hypercholesterolemia 09/10/2016   Thyroid nodule 09/10/2016   COPD (chronic obstructive pulmonary disease) (HCC) 09/10/2016   Family history of colon cancer 06/06/2016   Nuclear sclerotic cataract of both eyes 04/08/2013   Type 2 diabetes mellitus without complications (HCC) 09/13/2011   Essential hypertension 09/13/2011   Tobacco user 09/13/2011   DDD (degenerative disc disease), lumbar 09/13/2011   History of rectal polypectomy 08/31/2010     Current Outpatient Medications on File Prior to Visit  Medication Sig Dispense Refill   albuterol (VENTOLIN HFA) 108 (90 Base) MCG/ACT inhaler Inhale 2 puffs into the lungs every 6 (six) hours as needed for wheezing or shortness of breath. 3 each 3   amLODipine (NORVASC) 5 MG tablet  TAKE 1 TABLET BY MOUTH DAILY 90 tablet 0   aspirin EC 81 MG tablet Take 81 mg by mouth daily. Swallow whole.     Cholecalciferol (VITAMIN D3) 50 MCG (2000 UT) TABS Take 2,000 Units by mouth in the morning.     insulin glargine (LANTUS SOLOSTAR) 100 UNIT/ML Solostar Pen Inject 45 Units into the skin at bedtime. 15 mL 2   lisinopril (ZESTRIL) 40 MG tablet Take 1 tablet (40 mg total) by mouth daily. 90 tablet 0   metoprolol succinate (TOPROL-XL) 25 MG 24 hr tablet TAKE 1 TABLET BY MOUTH ONCE  DAILY 90 tablet 1   pravastatin (PRAVACHOL) 80 MG tablet Take 1 tablet (80 mg total) by mouth daily. 90 tablet 0   sitaGLIPtin-metformin (JANUMET) 50-1000 MG tablet Take 1 tablet by mouth 2 (two) times daily with a meal. 180 tablet 0   TRELEGY ELLIPTA 200-62.5-25 MCG/ACT AEPB USE 1 INHALATION BY MOUTH DAILY 180 each 3   ACCU-CHEK GUIDE test strip USE AS DIRECTED 400 strip 3   Accu-Chek Softclix Lancets lancets USE UP TO 4 TIMES DAILY AS  DIRECTED 200 each 6   acetaminophen (TYLENOL) 500 MG tablet Take 500-1,000 mg by mouth daily as needed (pain).     blood glucose meter kit and supplies KIT Dispense based on patient and insurance preference. Use up to four times daily as directed. (FOR ICD-9 250.00, 250.01). (Patient not taking: Reported on 05/30/2022) 1 each 0   blood glucose meter kit and supplies Dispense based on insurance preference. Use up to four times daily as directed. (  FOR ICD-10 E11.9 E11.65) AccuCheck Meter     1 each 0   dapagliflozin propanediol (FARXIGA) 5 MG TABS tablet Take 1 tablet (5 mg total) by mouth daily before breakfast. (Patient not taking: Reported on 05/30/2022) 30 tablet 0   Insulin Pen Needle (BD PEN NEEDLE NANO U/F) 32G X 4 MM MISC Inject 1 Dose into the skin daily. 100 each 3   Current Facility-Administered Medications on File Prior to Visit  Medication Dose Route Frequency Provider Last Rate Last Admin   sodium chloride flush (NS) 0.9 % injection 3 mL  3 mL Intravenous Q12H Dunn,  Dayna N, PA-C        No Known Allergies  Social History   Socioeconomic History   Marital status: Divorced    Spouse name: Not on file   Number of children: 3   Years of education: Not on file   Highest education level: GED or equivalent  Occupational History   Occupation: retired    Comment: [employer]  Tobacco Use   Smoking status: Every Day    Packs/day: 2.50    Years: 60.00    Additional pack years: 0.00    Total pack years: 150.00    Types: Cigarettes    Passive exposure: Current   Smokeless tobacco: Never   Tobacco comments:    1 ppd  Vaping Use   Vaping Use: Never used  Substance and Sexual Activity   Alcohol use: No   Drug use: No   Sexual activity: Not on file  Other Topics Concern   Not on file  Social History Narrative   Marital status: divorced; not dating in 2019 but interested slightly.        Children:  3 children; 7 seven grandchildren; 1 gg      Lives: with oldest daughter, 2 grandchildren, 1 gg, 1 friend of daughters, daughter's boyfriend.       Employment: retired [employer] age 42.      Tobacco; 1ppd x 50 years.  Not interested in 2019.      Alcohol: none      Exercise: none in 2019. [place]      ADLs:   independent with ADLs; no assistant devices      Advanced Directives:  None; DNR/DNI; no HCPOA.    Social Determinants of Health   Financial Resource Strain: Medium Risk (05/16/2022)   Overall Financial Resource Strain (CARDIA)    Difficulty of Paying Living Expenses: Somewhat hard  Food Insecurity: Food Insecurity Present (05/16/2022)   Hunger Vital Sign    Worried About Running Out of Food in the Last Year: Sometimes true    Ran Out of Food in the Last Year: Patient declined  Transportation Needs: No Transportation Needs (05/16/2022)   PRAPARE - Administrator, Civil Service (Medical): No    Lack of Transportation (Non-Medical): No  Physical Activity: Inactive (05/16/2022)   Exercise Vital Sign     Days of Exercise per Week: 0 days    Minutes of Exercise per Session: 0 min  Stress: No Stress Concern Present (05/16/2022)   [employer] of Occupational Health - Occupational Stress Questionnaire    Feeling of Stress : Not at all  Social Connections: Unknown (05/16/2022)   Social Connection and Isolation Panel [NHANES]    Frequency of Communication with Friends and Family: More than three times a week    Frequency of Social Gatherings with Friends and Family: Patient declined  Attends Religious Services: Patient declined    Active Member of Clubs or Organizations: No    Attends Banker Meetings: Never    Marital Status: Divorced  Catering manager Violence: Not At Risk (12/01/2021)   Humiliation, Afraid, Rape, and Kick questionnaire    Fear of Current or Ex-Partner: No    Emotionally Abused: No    Physically Abused: No    Sexually Abused: No    Family History  Problem Relation Age of Onset   Diabetes Sister    Diabetes Brother    Hypertension Brother    Cancer Mother 10       colon(age 52) and lung (age 50)   Diabetes Mother    Heart disease Mother 55       CABG   Hyperlipidemia Mother    Cancer Father 77       stomach   Diabetes Maternal Grandmother    Mental illness Sister     Past Surgical History:  Procedure Laterality Date   ABDOMINAL HYSTERECTOMY  2000   fibroids; ovaries intact.   BACK SURGERY     plate put in back   CHOLECYSTECTOMY N/A 05/01/2017   Procedure: LAPAROSCOPIC CHOLECYSTECTOMY WITH INTRAOPERATIVE CHOLANGIOGRAM;  Surgeon: Manus Rudd, MD;  Location: [facility] OR;  Service: General;  Laterality: N/A;   LEFT HEART CATH AND CORONARY ANGIOGRAPHY N/A 11/18/2020   Procedure: LEFT HEART CATH AND CORONARY ANGIOGRAPHY;  Surgeon: Lennette Bihari, MD;  Location: [facility];  Service: Cardiovascular;  Laterality: N/A;   RECTAL POLYPECTOMY  05/16/2010   TVAdenoma polyp   TUBAL LIGATION      ROS: Review of Systems Negative except as stated  above  PHYSICAL EXAM: BP 111/77 (BP Location: Right Arm, Patient Position: Sitting, Cuff Size: Large)   Pulse 92   Resp 16   Ht 5\' 7"  (1.702 m)   Wt 172 lb (78 kg)   SpO2 96%   BMI 26.94 kg/m   Physical Exam HENT:     Head: Normocephalic and atraumatic.  Eyes:     Extraocular Movements: Extraocular movements intact.     Conjunctiva/sclera: Conjunctivae normal.     Pupils: Pupils are equal, round, and reactive to light.  Cardiovascular:     Rate and Rhythm: Normal rate and regular rhythm.     Pulses: Normal pulses.     Heart sounds: Normal heart sounds.  Pulmonary:     Effort: Pulmonary effort is normal.     Breath sounds: Normal breath sounds.  Musculoskeletal:     Cervical back: Normal range of motion and neck supple.  Neurological:     General: No focal deficit present.     Mental Status: She is alert and oriented to person, place, and time.  Psychiatric:        Mood and Affect: Mood normal.        Behavior: Behavior normal.     ASSESSMENT AND PLAN: 1. Type 2 diabetes mellitus without complication, with long-term current use of insulin (HCC) - Hemoglobin A1c above goal on 05/17/2022 at 8.8%, goal 8%. This is increased compared to previous 7.7%.  - Patient declined beginning new diabetic medication/medication adjustments.  - Continue Sitagliptin-Metformin and Insulin Glargine as prescribed. No refills needed as of present.  - Referral to Endocrinology for further evaluation/management. During the interim follow-up with primary provider as scheduled.  - Ambulatory referral to Endocrinology   Patient was given the opportunity to ask questions.  Patient verbalized understanding of the plan and  was able to repeat key elements of the plan. Patient was given clear instructions to go to Emergency Department or return to medical center if symptoms don't improve, worsen, or new problems develop.The patient verbalized understanding.   Orders Placed This Encounter  Procedures    Ambulatory referral to Endocrinology   Follow-up with primary provider as scheduled.   Rema Fendt, NP

## 2022-05-30 ENCOUNTER — Ambulatory Visit (INDEPENDENT_AMBULATORY_CARE_PROVIDER_SITE_OTHER): Payer: Medicare Other | Admitting: Family

## 2022-05-30 ENCOUNTER — Encounter: Payer: Self-pay | Admitting: Family

## 2022-05-30 VITALS — BP 111/77 | HR 92 | Resp 16 | Ht 67.0 in | Wt 172.0 lb

## 2022-05-30 DIAGNOSIS — Z794 Long term (current) use of insulin: Secondary | ICD-10-CM | POA: Diagnosis not present

## 2022-05-30 DIAGNOSIS — E119 Type 2 diabetes mellitus without complications: Secondary | ICD-10-CM

## 2022-05-30 NOTE — Progress Notes (Signed)
Discuss Farxiga. Drops blood sugars to 50's. Does not want to this Rx.

## 2022-07-04 ENCOUNTER — Other Ambulatory Visit: Payer: Self-pay | Admitting: Pulmonary Disease

## 2022-07-10 ENCOUNTER — Other Ambulatory Visit: Payer: Self-pay | Admitting: Family

## 2022-07-10 DIAGNOSIS — I10 Essential (primary) hypertension: Secondary | ICD-10-CM

## 2022-07-11 ENCOUNTER — Other Ambulatory Visit: Payer: Self-pay | Admitting: Family

## 2022-07-11 DIAGNOSIS — I10 Essential (primary) hypertension: Secondary | ICD-10-CM

## 2022-07-11 NOTE — Telephone Encounter (Signed)
Requested Prescriptions  Pending Prescriptions Disp Refills   amLODipine (NORVASC) 5 MG tablet [Pharmacy Med Name: amLODIPine Besylate 5 MG Oral Tablet] 90 tablet 0    Sig: TAKE 1 TABLET BY MOUTH DAILY     Cardiovascular: Calcium Channel Blockers 2 Passed - 07/10/2022 10:35 PM      Passed - Last BP in normal range    BP Readings from Last 1 Encounters:  05/30/22 111/77         Passed - Last Heart Rate in normal range    Pulse Readings from Last 1 Encounters:  05/30/22 92         Passed - Valid encounter within last 6 months    Recent Outpatient Visits           1 month ago Type 2 diabetes mellitus without complication, with long-term current use of insulin (HCC)   [place] [facility], Amy J, NP   1 month ago Type 2 diabetes mellitus without complication, with long-term current use of insulin (HCC)   [place] [facility], Amy J, NP   5 months ago Type 2 diabetes mellitus without complication, with long-term current use of insulin (HCC)   [place] [facility], Amy J, NP   8 months ago Type 2 diabetes mellitus without complication, with long-term current use of insulin (HCC)   [place] [facility], Amy J, NP   9 months ago Type 2 diabetes mellitus without complication, with long-term current use of insulin [facility])   [place] [facility], [place], NP       Future Appointments             In 1 month Rema Fendt, NP [facility]   In 3 months Turner, Cornelious Bryant, MD [facility]

## 2022-07-12 NOTE — Telephone Encounter (Signed)
Requested Prescriptions  Pending Prescriptions Disp Refills   metoprolol succinate (TOPROL-XL) 25 MG 24 hr tablet [Pharmacy Med Name: Metoprolol Succinate ER 25 MG Oral Tablet Extended Release 24 Hour] 100 tablet 0    Sig: TAKE 1 TABLET BY MOUTH ONCE  DAILY     Cardiovascular:  Beta Blockers Passed - 07/11/2022 10:01 PM      Passed - Last BP in normal range    BP Readings from Last 1 Encounters:  05/30/22 111/77         Passed - Last Heart Rate in normal range    Pulse Readings from Last 1 Encounters:  05/30/22 92         Passed - Valid encounter within last 6 months    Recent Outpatient Visits           1 month ago Type 2 diabetes mellitus without complication, with long-term current use of insulin (HCC)   [place] [facility], Amy J, NP   1 month ago Type 2 diabetes mellitus without complication, with long-term current use of insulin (HCC)   [place] [facility], Amy J, NP   5 months ago Type 2 diabetes mellitus without complication, with long-term current use of insulin (HCC)   [place] [facility], Amy J, NP   8 months ago Type 2 diabetes mellitus without complication, with long-term current use of insulin (HCC)   [place] [facility], Amy J, NP   9 months ago Type 2 diabetes mellitus without complication, with long-term current use of insulin [facility])   [place] [facility], [place], NP       Future Appointments             In 1 month Rema Fendt, NP [facility] at [facility]   In 3 months Turner, Cornelious Bryant, MD [facility]

## 2022-07-20 ENCOUNTER — Other Ambulatory Visit: Payer: Self-pay | Admitting: Family

## 2022-07-20 DIAGNOSIS — I2510 Atherosclerotic heart disease of native coronary artery without angina pectoris: Secondary | ICD-10-CM

## 2022-07-20 DIAGNOSIS — E7800 Pure hypercholesterolemia, unspecified: Secondary | ICD-10-CM

## 2022-07-27 ENCOUNTER — Other Ambulatory Visit: Payer: Self-pay | Admitting: Family

## 2022-07-27 DIAGNOSIS — Z0000 Encounter for general adult medical examination without abnormal findings: Secondary | ICD-10-CM

## 2022-08-16 ENCOUNTER — Other Ambulatory Visit: Payer: Self-pay | Admitting: Family

## 2022-08-16 DIAGNOSIS — I2510 Atherosclerotic heart disease of native coronary artery without angina pectoris: Secondary | ICD-10-CM

## 2022-08-16 DIAGNOSIS — E7800 Pure hypercholesterolemia, unspecified: Secondary | ICD-10-CM

## 2022-08-17 ENCOUNTER — Other Ambulatory Visit: Payer: Self-pay

## 2022-08-17 ENCOUNTER — Ambulatory Visit: Payer: Medicare Other | Admitting: Family

## 2022-08-17 DIAGNOSIS — E7800 Pure hypercholesterolemia, unspecified: Secondary | ICD-10-CM

## 2022-08-17 DIAGNOSIS — I2510 Atherosclerotic heart disease of native coronary artery without angina pectoris: Secondary | ICD-10-CM

## 2022-08-17 MED ORDER — PRAVASTATIN SODIUM 80 MG PO TABS: 80.0000 mg | ORAL_TABLET | Freq: Every day | ORAL | 0 refills | Status: DC

## 2022-08-17 NOTE — Telephone Encounter (Signed)
Rx refill sent in 08/17/2022 @ 8:29am

## 2022-08-20 ENCOUNTER — Telehealth (INDEPENDENT_AMBULATORY_CARE_PROVIDER_SITE_OTHER): Payer: Medicare Other | Admitting: Family

## 2022-08-20 DIAGNOSIS — I2510 Atherosclerotic heart disease of native coronary artery without angina pectoris: Secondary | ICD-10-CM

## 2022-08-20 DIAGNOSIS — E7800 Pure hypercholesterolemia, unspecified: Secondary | ICD-10-CM

## 2022-08-20 DIAGNOSIS — I10 Essential (primary) hypertension: Secondary | ICD-10-CM

## 2022-08-20 DIAGNOSIS — Z7984 Long term (current) use of oral hypoglycemic drugs: Secondary | ICD-10-CM

## 2022-08-20 DIAGNOSIS — E119 Type 2 diabetes mellitus without complications: Secondary | ICD-10-CM

## 2022-08-20 DIAGNOSIS — Z794 Long term (current) use of insulin: Secondary | ICD-10-CM

## 2022-08-20 MED ORDER — LISINOPRIL 40 MG PO TABS: 40.0000 mg | ORAL_TABLET | Freq: Every day | ORAL | 0 refills | Status: DC

## 2022-08-20 MED ORDER — PRAVASTATIN SODIUM 80 MG PO TABS: 80.0000 mg | ORAL_TABLET | Freq: Every day | ORAL | 0 refills | Status: DC

## 2022-08-20 MED ORDER — AMLODIPINE BESYLATE 5 MG PO TABS: 5.0000 mg | ORAL_TABLET | Freq: Every day | ORAL | 0 refills | Status: DC

## 2022-08-20 MED ORDER — METOPROLOL SUCCINATE ER 25 MG PO TB24: 25.0000 mg | ORAL_TABLET | Freq: Every day | ORAL | 0 refills | Status: DC

## 2022-08-20 NOTE — Progress Notes (Signed)
Virtual Visit via Video Note  I connected with [name], on 08/20/2022 at 11:54 AM by video and verified that I am speaking with the correct person using two identifiers.  Consent: I discussed the limitations, risks, security and privacy concerns of performing an evaluation and management service by video and the availability of in person appointments. I also discussed with the patient that there may be a patient responsible charge related to this service. The patient expressed understanding and agreed to proceed.   Location of Patient: Home  Location of Provider: [place] [facility]   Persons participating in Telemedicine visit: CHERYLANNE FAIRLESS Ricky Stabs, NP Curley Spice, CMA   History of Present Illness: [name] is a 77 y.o. female who presents for chronic conditions follow-up. Reports she is doing well on Amlodipine, Lisinopril, Metoprolol, and Pravastatin no issues/concerns. She does not complain of red flag symptoms such as but not limited to chest pain, shortness of breath, worst headache of life, nausea/vomiting. Reports she has an upcoming appointment scheduled with Cardiology. Reports since previous office visit she is established with Endocrinology for diabetes management.   Past Medical History:  Diagnosis Date   Carotid stenosis    1-39% bilateral stenosis   COPD (chronic obstructive pulmonary disease) (HCC)    Diabetes mellitus    Hyperlipidemia    Hypertension    Night sweats    Rectal polyp    No Known Allergies  Current Outpatient Medications on File Prior to Visit  Medication Sig Dispense Refill   ACCU-CHEK GUIDE test strip USE AS DIRECTED 400 strip 3   Accu-Chek Softclix Lancets lancets USE UP TO 4 TIMES DAILY AS  DIRECTED 200 each 6   acetaminophen (TYLENOL) 500 MG tablet Take 500-1,000 mg by mouth daily as needed (pain).     albuterol (VENTOLIN HFA) 108 (90 Base) MCG/ACT inhaler Inhale 2 puffs into the lungs every 6 (six)  hours as needed for wheezing or shortness of breath. 3 each 3   amLODipine (NORVASC) 5 MG tablet TAKE 1 TABLET BY MOUTH DAILY 90 tablet 0   aspirin EC 81 MG tablet Take 81 mg by mouth daily. Swallow whole.     blood glucose meter kit and supplies KIT Dispense based on patient and insurance preference. Use up to four times daily as directed. (FOR ICD-9 250.00, 250.01). (Patient not taking: Reported on 05/30/2022) 1 each 0   blood glucose meter kit and supplies Dispense based on insurance preference. Use up to four times daily as directed. (FOR ICD-10 E11.9 E11.65) AccuCheck Meter     1 each 0   Cholecalciferol (VITAMIN D3) 50 MCG (2000 UT) TABS Take 2,000 Units by mouth in the morning.     dapagliflozin propanediol (FARXIGA) 5 MG TABS tablet Take 1 tablet (5 mg total) by mouth daily before breakfast. (Patient not taking: Reported on 05/30/2022) 30 tablet 0   insulin glargine (LANTUS SOLOSTAR) 100 UNIT/ML Solostar Pen Inject 45 Units into the skin at bedtime. 15 mL 2   Insulin Pen Needle (BD PEN NEEDLE NANO U/F) 32G X 4 MM MISC Inject 1 Dose into the skin daily. 100 each 3   lisinopril (ZESTRIL) 40 MG tablet Take 1 tablet (40 mg total) by mouth daily. 90 tablet 0   metoprolol succinate (TOPROL-XL) 25 MG 24 hr tablet TAKE 1 TABLET BY MOUTH ONCE  DAILY 100 tablet 0   pravastatin (PRAVACHOL) 80 MG tablet TAKE 1 TABLET BY MOUTH DAILY 90 tablet 3  pravastatin (PRAVACHOL) 80 MG tablet Take 1 tablet (80 mg total) by mouth daily. 90 tablet 0   sitaGLIPtin-metformin (JANUMET) 50-1000 MG tablet Take 1 tablet by mouth 2 (two) times daily with a meal. 180 tablet 0   TRELEGY ELLIPTA 200-62.5-25 MCG/ACT AEPB USE 1 INHALATION BY MOUTH DAILY 180 each 3   Current Facility-Administered Medications on File Prior to Visit  Medication Dose Route Frequency Provider Last Rate Last Admin   sodium chloride flush (NS) 0.9 % injection 3 mL  3 mL Intravenous Q12H Dunn, Dayna N, PA-C        Observations/Objective: Alert and  oriented x 3. Not in acute distress. Physical examination not completed as this is a telemedicine visit.  Assessment and Plan: Note - I attempted to connect with patient via video. I was able to hear patient. Patient states she was unable to hear me. Patient agreeable to telephone visit.  1. Primary hypertension - Continue Amlodipine, Metoprolol Succinate, and Lisinopril as prescribed.  - Counseled on blood pressure goal of less than 140/90, low-sodium, DASH diet, medication compliance, 150 minutes of moderate intensity exercise per week as tolerated. Discussed medication compliance, adverse effects. - Keep all scheduled appointments with Cardiology. During the interim follow-up with primary provider as scheduled.  - amLODipine (NORVASC) 5 MG tablet; Take 1 tablet (5 mg total) by mouth daily.  Dispense: 90 tablet; Refill: 0 - metoprolol succinate (TOPROL-XL) 25 MG 24 hr tablet; Take 1 tablet (25 mg total) by mouth daily.  Dispense: 90 tablet; Refill: 0 - lisinopril (ZESTRIL) 40 MG tablet; Take 1 tablet (40 mg total) by mouth daily.  Dispense: 90 tablet; Refill: 0  2. Pure hypercholesterolemia - Continue Pravastatin as prescribed.  - Keep all scheduled appointments with Cardiology. During the interim follow-up with primary provider as scheduled.  - pravastatin (PRAVACHOL) 80 MG tablet; Take 1 tablet (80 mg total) by mouth daily.  Dispense: 90 tablet; Refill: 0  3. CAD in native artery - Keep all scheduled appointments with Cardiology. During the interim follow-up with primary provider as scheduled.  - pravastatin (PRAVACHOL) 80 MG tablet; Take 1 tablet (80 mg total) by mouth daily.  Dispense: 90 tablet; Refill: 0  4. Type 2 diabetes mellitus without complication, with long-term current use of insulin (HCC) - Keep all scheduled appointments with Endocrinology.    Follow Up Instructions: Follow-up with primary provider as scheduled.    Patient was given clear instructions to go to Emergency  Department or return to medical center if symptoms don't improve, worsen, or new problems develop.The patient verbalized understanding.  I discussed the assessment and treatment plan with the patient. The patient was provided an opportunity to ask questions and all were answered. The patient agreed with the plan and demonstrated an understanding of the instructions.   The patient was advised to call back or seek an in-person evaluation if the symptoms worsen or if the condition fails to improve as anticipated.   I provided 10 minutes total of non-face-to-face time during this encounter.   Rema Fendt, NP  [facility] at [facility] [place], [place] [phone] 08/20/2022, 11:54 AM

## 2022-08-27 ENCOUNTER — Ambulatory Visit
Admission: RE | Admit: 2022-08-27 | Discharge: 2022-08-27 | Disposition: A | Discharge status: Home / Self Care | Payer: Medicare Other | Source: Ambulatory Visit | Attending: Family | Admitting: Family

## 2022-08-27 DIAGNOSIS — Z0000 Encounter for general adult medical examination without abnormal findings: Secondary | ICD-10-CM

## 2022-09-03 ENCOUNTER — Other Ambulatory Visit: Payer: Self-pay

## 2022-09-03 ENCOUNTER — Ambulatory Visit
Admission: RE | Admit: 2022-09-03 | Discharge: 2022-09-03 | Disposition: A | Discharge status: Home / Self Care | Payer: Medicare Other | Source: Ambulatory Visit | Attending: Family Medicine | Admitting: Family Medicine

## 2022-09-03 VITALS — BP 156/78 | HR 80 | Temp 98.3°F | Resp 20

## 2022-09-03 DIAGNOSIS — M545 Low back pain, unspecified: Secondary | ICD-10-CM

## 2022-09-03 LAB — POCT URINALYSIS DIP (MANUAL ENTRY)
Bilirubin, UA: NEGATIVE
Blood, UA: NEGATIVE
Glucose, UA: >=1000 mg/dL — AB
Ketones, POC UA: NEGATIVE mg/dL
Leukocytes, UA: NEGATIVE
Nitrite, UA: NEGATIVE
Protein Ur, POC: NEGATIVE mg/dL
Spec Grav, UA: 1.01 (ref 1.010–1.025)
Urobilinogen, UA: 0.2 E.U./dL
pH, UA: 5.5 (ref 5.0–8.0)

## 2022-09-03 LAB — POCT FASTING CBG KUC MANUAL ENTRY: POCT Glucose (KUC): 147 mg/dL — AB (ref 70–99)

## 2022-09-03 MED ORDER — TIZANIDINE HCL 2 MG PO TABS: 2.0000 mg | ORAL_TABLET | Freq: Two times a day (BID) | ORAL | 0 refills | Status: DC | PRN

## 2022-09-03 NOTE — ED Triage Notes (Signed)
C/o intermittent right flank pain since last Thursday. Denies new urinary Sx

## 2022-09-03 NOTE — Discharge Instructions (Signed)
I have prescribed you a muscle relaxer to take as needed for lower back pain.  Please be advised that it can make you sleepy so no driving or drinking alcohol with it.  Follow-up if symptoms persist or worsen.

## 2022-09-03 NOTE — ED Provider Notes (Signed)
[facility]    CSN: [id number] Arrival date & time: 09/03/22  0847      History   Chief Complaint Chief Complaint  Patient presents with   Flank Pain    HPI ROTHA LAUR is a 77 y.o. female.   Patient presents with right lower back pain that started about 5 days ago.  Denies any injury to the area.  Although, patient reports that she has had lumbar surgery where "a plate was placed in L4-L5" multiple years ago.  Reports that she has been having intermittent lower back pain in the similar area ever since.  Denies numbness or tingling.  Pain does not radiate down leg.  Denies urinary frequency, urinary or bowel incontinence, dysuria, saddle anesthesia.  Denies any associated hematuria.  Patient is taking Tylenol for pain with some improvement.   Flank Pain    Past Medical History:  Diagnosis Date   Carotid stenosis    1-39% bilateral stenosis   COPD (chronic obstructive pulmonary disease) (HCC)    Diabetes mellitus    Hyperlipidemia    Hypertension    Night sweats    Rectal polyp     Patient Active Problem List   Diagnosis Date Noted   Diarrhea 05/30/2021   Family history of malignant neoplasm of gastrointestinal tract 05/30/2021   Fecal urgency 05/30/2021   Hemorrhoids 05/30/2021   Personal history of colonic polyps 05/30/2021   Rectal bleeding 05/30/2021   Carotid stenosis 12/01/2020   CAD in native artery    DOE (dyspnea on exertion)    Diastolic dysfunction 07/22/2020   Cyst of right ovary 07/28/2017   Pure hypercholesterolemia 09/10/2016   Thyroid nodule 09/10/2016   COPD (chronic obstructive pulmonary disease) (HCC) 09/10/2016   Family history of colon cancer 06/06/2016   Nuclear sclerotic cataract of both eyes 04/08/2013   Type 2 diabetes mellitus without complications (HCC) 09/13/2011   Essential hypertension 09/13/2011   Tobacco user 09/13/2011   DDD (degenerative disc disease), lumbar 09/13/2011   History of rectal polypectomy  08/31/2010    Past Surgical History:  Procedure Laterality Date   ABDOMINAL HYSTERECTOMY  2000   fibroids; ovaries intact.   BACK SURGERY     plate put in back   CHOLECYSTECTOMY N/A 05/01/2017   Procedure: LAPAROSCOPIC CHOLECYSTECTOMY WITH INTRAOPERATIVE CHOLANGIOGRAM;  Surgeon: Manus Rudd, MD;  Location: [facility] OR;  Service: General;  Laterality: N/A;   LEFT HEART CATH AND CORONARY ANGIOGRAPHY N/A 11/18/2020   Procedure: LEFT HEART CATH AND CORONARY ANGIOGRAPHY;  Surgeon: Lennette Bihari, MD;  Location: [facility] INVASIVE CV LAB;  Service: Cardiovascular;  Laterality: N/A;   RECTAL POLYPECTOMY  05/16/2010   TVAdenoma polyp   TUBAL LIGATION      OB History   No obstetric history on file.      Home Medications    Prior to Admission medications   Medication Sig Start Date End Date Taking? Authorizing Provider  acetaminophen (TYLENOL) 500 MG tablet Take 500-1,000 mg by mouth daily as needed (pain).   Yes [provider]  amLODipine (NORVASC) 5 MG tablet Take 1 tablet (5 mg total) by mouth daily. 08/20/22  Yes Zonia Kief, Amy J, NP  aspirin EC 81 MG tablet Take 81 mg by mouth daily. Swallow whole.   Yes [provider]  Cholecalciferol (VITAMIN D3) 50 MCG (2000 UT) TABS Take 2,000 Units by mouth in the morning.   Yes [provider]  insulin glargine (LANTUS SOLOSTAR) 100 UNIT/ML Solostar Pen Inject 45 Units  into the skin at bedtime. 05/18/22  Yes Zonia Kief, Amy J, NP  lisinopril (ZESTRIL) 40 MG tablet Take 1 tablet (40 mg total) by mouth daily. 08/20/22  Yes Zonia Kief, Amy J, NP  metoprolol succinate (TOPROL-XL) 25 MG 24 hr tablet Take 1 tablet (25 mg total) by mouth daily. 08/20/22 11/18/22 Yes Zonia Kief, Amy J, NP  pravastatin (PRAVACHOL) 80 MG tablet Take 1 tablet (80 mg total) by mouth daily. 08/20/22  Yes Zonia Kief, Amy J, NP  sitaGLIPtin-metformin (JANUMET) 50-1000 MG tablet Take 1 tablet by mouth 2 (two) times daily with a meal. 05/18/22  Yes Zonia Kief, Amy J, NP  tiZANidine  (ZANAFLEX) 2 MG tablet Take 1 tablet (2 mg total) by mouth every 12 (twelve) hours as needed for muscle spasms. 09/03/22  Yes Gustavus Bryant, [place]  Harrel Carina ELLIPTA 200-62.5-25 MCG/ACT AEPB USE 1 INHALATION BY MOUTH DAILY 07/11/21  Yes Virl Diamond A, MD  ACCU-CHEK GUIDE test strip USE AS DIRECTED 08/27/21   Rema Fendt, NP  Accu-Chek Softclix Lancets lancets USE UP TO 4 TIMES DAILY AS  DIRECTED 07/30/21   Rema Fendt, NP  albuterol (VENTOLIN HFA) 108 (90 Base) MCG/ACT inhaler Inhale 2 puffs into the lungs every 6 (six) hours as needed for wheezing or shortness of breath. 01/31/21   Tomma Lightning, MD  blood glucose meter kit and supplies KIT Dispense based on patient and insurance preference. Use up to four times daily as directed. (FOR ICD-9 250.00, 250.01). Patient not taking: Reported on 05/30/2022 02/17/15   Ethelda Chick, MD  blood glucose meter kit and supplies Dispense based on insurance preference. Use up to four times daily as directed. (FOR ICD-10 E11.9 E11.65) AccuCheck Meter     03/18/19   Doristine Bosworth, MD  dapagliflozin propanediol (FARXIGA) 5 MG TABS tablet Take 1 tablet (5 mg total) by mouth daily before breakfast. Patient not taking: Reported on 05/30/2022 05/18/22   Rema Fendt, NP  Insulin Pen Needle (BD PEN NEEDLE NANO U/F) 32G X 4 MM MISC Inject 1 Dose into the skin daily. 02/16/22   Rema Fendt, NP  pravastatin (PRAVACHOL) 80 MG tablet Take 1 tablet (80 mg total) by mouth daily. 08/17/22   Rema Fendt, NP    Family History Family History  Problem Relation Age of Onset   Diabetes Sister    Diabetes Brother    Hypertension Brother    Cancer Mother 49       colon(age 82) and lung (age 58)   Diabetes Mother    Heart disease Mother 66       CABG   Hyperlipidemia Mother    Cancer Father 84       stomach   Diabetes Maternal Grandmother    Mental illness Sister     Social History Social History   Tobacco Use   Smoking status: Every Day    Current  packs/day: 2.50    Average packs/day: 2.5 packs/day for 60.0 years (150.0 ttl pk-yrs)    Types: Cigarettes    Passive exposure: Current   Smokeless tobacco: Never   Tobacco comments:    1 ppd  Vaping Use   Vaping status: Never Used  Substance Use Topics   Alcohol use: No   Drug use: No     Allergies   Patient has no known allergies.   Review of Systems Review of Systems Per HPI  Physical Exam Triage Vital Signs ED Triage Vitals  Encounter Vitals Group  BP 09/03/22 0923 (!) 156/78     Systolic BP Percentile --      Diastolic BP Percentile --      Pulse Rate 09/03/22 0923 80     Resp 09/03/22 0923 20     Temp 09/03/22 0923 98.3 F (36.8 C)     Temp Source 09/03/22 0923 Oral     SpO2 09/03/22 0923 92 %     Weight --      Height --      Head Circumference --      Peak Flow --      Pain Score 09/03/22 0924 2     Pain Loc --      Pain Education --      Exclude from Growth Chart --    No data found.  Updated Vital Signs BP (!) 156/78 (BP Location: Left Arm)   Pulse 80   Temp 98.3 F (36.8 C) (Oral)   Resp 20   SpO2 98%   Visual Acuity Right Eye Distance:   Left Eye Distance:   Bilateral Distance:    Right Eye Near:   Left Eye Near:    Bilateral Near:     Physical Exam Constitutional:      General: She is not in acute distress.    Appearance: Normal appearance. She is not toxic-appearing or diaphoretic.  HENT:     Head: Normocephalic and atraumatic.  Eyes:     Extraocular Movements: Extraocular movements intact.     Conjunctiva/sclera: Conjunctivae normal.  Pulmonary:     Effort: Pulmonary effort is normal.  Musculoskeletal:     Comments: Patient has tenderness to palpation to right lower lumbar region.  There is no direct spinal tenderness, crepitus, step-off noted.  Neurological:     General: No focal deficit present.     Mental Status: She is alert and oriented to person, place, and time. Mental status is at baseline.  Psychiatric:         Mood and Affect: Mood normal.        Behavior: Behavior normal.        Thought Content: Thought content normal.        Judgment: Judgment normal.      UC Treatments / Results  Labs (all labs ordered are listed, but only abnormal results are displayed) Labs Reviewed  POCT URINALYSIS DIP (MANUAL ENTRY) - Abnormal; Notable for the following components:      Result Value   Glucose, UA >=1,000 (*)    All other components within normal limits  POCT FASTING CBG [facility] MANUAL ENTRY - Abnormal; Notable for the following components:   POCT Glucose ([facility]) 147 (*)    All other components within normal limits    EKG   Radiology No results found.  Procedures Procedures (including critical care time)  Medications Ordered in UC Medications - No data to display  Initial Impression / Assessment and Plan / UC Course  I have reviewed the triage vital signs and the nursing notes.  Pertinent labs & imaging results that were available during my care of the patient were reviewed by me and considered in my medical decision making (see chart for details).     UA unremarkable other than glucose.  Glucose was checked today which was 147.  Suspect patient's daily medication is causing excretion of glucose in the urine.  Suspect musculoskeletal etiology of patient's pain given it is reproducible with palpation.  Will treat with muscle relaxer.  Patient advised that  this can make her drowsy and do not drive or drink alcohol while taking it.  Patient appears very alert, oriented, and aware of medical treatment so I do think that muscle relaxer will be safe for her.  Advised supportive care and following up with PCP, urgent care, or provided contact for orthopedist if pain persists or worsens.  Patient verbalized understanding and was agreeable with plan. Final Clinical Impressions(s) / UC Diagnoses   Final diagnoses:  Acute right-sided low back pain without sciatica     Discharge Instructions      I  have prescribed you a muscle relaxer to take as needed for lower back pain.  Please be advised that it can make you sleepy so no driving or drinking alcohol with it.  Follow-up if symptoms persist or worsen.    ED Prescriptions     Medication Sig Dispense Auth. Provider   tiZANidine (ZANAFLEX) 2 MG tablet Take 1 tablet (2 mg total) by mouth every 12 (twelve) hours as needed for muscle spasms. 15 tablet [place], Acie Fredrickson, [place]      PDMP not reviewed this encounter.   Gustavus Bryant, [place] 09/03/22 1020

## 2022-09-04 ENCOUNTER — Other Ambulatory Visit: Payer: Self-pay | Admitting: Family

## 2022-09-04 DIAGNOSIS — Z794 Long term (current) use of insulin: Secondary | ICD-10-CM

## 2022-09-04 NOTE — Telephone Encounter (Signed)
Patient established with Endocrinology for diabetes management. Please request refills from the same. Please let me know if I can further assist. Thank you.

## 2022-09-05 NOTE — Telephone Encounter (Signed)
Patient established with Endocrinology for diabetes management. Please request refills from the same. Please let me know if I can further assist. Thank you.

## 2022-10-17 ENCOUNTER — Ambulatory Visit: Payer: Medicare Other

## 2022-10-17 DIAGNOSIS — Z0000 Encounter for general adult medical examination without abnormal findings: Secondary | ICD-10-CM | POA: Diagnosis not present

## 2022-10-17 NOTE — Progress Notes (Signed)
Subjective:   [name] is a 77 y.o. female who presents for Medicare Annual (Subsequent) preventive examination.  Visit Complete: Virtual  I connected with  [name] on 10/17/22 by a audio enabled telemedicine application and verified that I am speaking with the correct person using two identifiers.  Patient Location: Home  Provider Location: Office/Clinic  I discussed the limitations of evaluation and management by telemedicine. The patient expressed understanding and agreed to proceed.  Patient Medicare AWV questionnaire was completed by the patient on 10/13/2022; I have confirmed that all information answered by patient is correct and no changes since this date.  Because this visit was a virtual/telehealth visit, some criteria may be missing or patient reported. Any vitals not documented were not able to be obtained and vitals that have been documented are patient reported.   Cardiac Risk Factors include: advanced age (>21men, >53 women);diabetes mellitus;hypertension     Objective:    Today's Vitals   There is no height or weight on file to calculate BMI.     10/17/2022    8:18 AM 11/18/2020    8:28 AM 08/01/2020    8:54 AM 07/24/2017   10:02 AM 04/30/2017    2:06 PM 06/06/2016    9:13 AM 05/31/2015    5:16 PM  Advanced Directives  Does Patient Have a Medical Advance Directive? Yes No No No No No No  Type of Estate agent of [place];Living will        Copy of Healthcare Power of Attorney in Chart? Yes - validated most recent copy scanned in chart (See row information)        Would patient like information on creating a medical advance directive?  No - Patient declined Yes (Inpatient - patient defers creating a medical advance directive at this time - Information given) No - Patient declined No - Patient declined Yes (ED - Information included in AVS)     Current Medications (verified) Outpatient Encounter Medications as of 10/17/2022  Medication  Sig   ACCU-CHEK GUIDE test strip USE AS DIRECTED   Accu-Chek Softclix Lancets lancets USE UP TO 4 TIMES DAILY AS  DIRECTED   acetaminophen (TYLENOL) 500 MG tablet Take 500-1,000 mg by mouth daily as needed (pain).   albuterol (VENTOLIN HFA) 108 (90 Base) MCG/ACT inhaler Inhale 2 puffs into the lungs every 6 (six) hours as needed for wheezing or shortness of breath.   amLODipine (NORVASC) 5 MG tablet Take 1 tablet (5 mg total) by mouth daily.   aspirin EC 81 MG tablet Take 81 mg by mouth daily. Swallow whole.   blood glucose meter kit and supplies KIT Dispense based on patient and insurance preference. Use up to four times daily as directed. (FOR ICD-9 250.00, 250.01).   blood glucose meter kit and supplies Dispense based on insurance preference. Use up to four times daily as directed. (FOR ICD-10 E11.9 E11.65) AccuCheck Meter       Cholecalciferol (VITAMIN D3) 50 MCG (2000 UT) TABS Take 2,000 Units by mouth in the morning.   insulin glargine (LANTUS SOLOSTAR) 100 UNIT/ML Solostar Pen Inject 45 Units into the skin at bedtime.   Insulin Pen Needle (BD PEN NEEDLE NANO U/F) 32G X 4 MM MISC Inject 1 Dose into the skin daily.   lisinopril (ZESTRIL) 40 MG tablet Take 1 tablet (40 mg total) by mouth daily.   metoprolol succinate (TOPROL-XL) 25 MG 24 hr tablet Take 1 tablet (25 mg total) by mouth daily.  pravastatin (PRAVACHOL) 80 MG tablet Take 1 tablet (80 mg total) by mouth daily.   sitaGLIPtin-metformin (JANUMET) 50-1000 MG tablet Take 1 tablet by mouth 2 (two) times daily with a meal.   TRELEGY ELLIPTA 200-62.5-25 MCG/ACT AEPB USE 1 INHALATION BY MOUTH DAILY   dapagliflozin propanediol (FARXIGA) 5 MG TABS tablet Take 1 tablet (5 mg total) by mouth daily before breakfast. (Patient not taking: Reported on 05/30/2022)   pravastatin (PRAVACHOL) 80 MG tablet Take 1 tablet (80 mg total) by mouth daily. (Patient not taking: Reported on 10/17/2022)   tiZANidine (ZANAFLEX) 2 MG tablet Take 1 tablet (2 mg  total) by mouth every 12 (twelve) hours as needed for muscle spasms. (Patient not taking: Reported on 10/17/2022)   Facility-Administered Encounter Medications as of 10/17/2022  Medication   sodium chloride flush (NS) 0.9 % injection 3 mL    Allergies (verified) Patient has no known allergies.   History: Past Medical History:  Diagnosis Date   Carotid stenosis    1-39% bilateral stenosis   COPD (chronic obstructive pulmonary disease) (HCC)    Diabetes mellitus    Hyperlipidemia    Hypertension    Night sweats    Rectal polyp    Past Surgical History:  Procedure Laterality Date   ABDOMINAL HYSTERECTOMY  2000   fibroids; ovaries intact.   BACK SURGERY     plate put in back   CHOLECYSTECTOMY N/A 05/01/2017   Procedure: LAPAROSCOPIC CHOLECYSTECTOMY WITH INTRAOPERATIVE CHOLANGIOGRAM;  Surgeon: Manus Rudd, MD;  Location: [facility] OR;  Service: General;  Laterality: N/A;   LEFT HEART CATH AND CORONARY ANGIOGRAPHY N/A 11/18/2020   Procedure: LEFT HEART CATH AND CORONARY ANGIOGRAPHY;  Surgeon: Lennette Bihari, MD;  Location: [facility] INVASIVE CV LAB;  Service: Cardiovascular;  Laterality: N/A;   RECTAL POLYPECTOMY  05/16/2010   TVAdenoma polyp   TUBAL LIGATION     Family History  Problem Relation Age of Onset   Diabetes Sister    Diabetes Brother    Hypertension Brother    Cancer Mother 32       colon(age 50) and lung (age 41)   Diabetes Mother    Heart disease Mother 71       CABG   Hyperlipidemia Mother    Cancer Father 93       stomach   Diabetes Maternal Grandmother    Mental illness Sister    Social History   Socioeconomic History   Marital status: Divorced    Spouse name: Not on file   Number of children: 3   Years of education: Not on file   Highest education level: GED or equivalent  Occupational History   Occupation: retired    Comment: [employer]  Tobacco Use   Smoking status: Every Day    Current packs/day: 2.50    Average packs/day: 2.5 packs/day for  60.0 years (150.0 ttl pk-yrs)    Types: Cigarettes    Passive exposure: Current   Smokeless tobacco: Never   Tobacco comments:    1 ppd  Vaping Use   Vaping status: Never Used  Substance and Sexual Activity   Alcohol use: No   Drug use: No   Sexual activity: Not on file  Other Topics Concern   Not on file  Social History Narrative   Marital status: divorced; not dating in 2019 but interested slightly.        Children:  3 children; 7 seven grandchildren; 1 gg      Lives: with oldest  daughter, 2 grandchildren, 1 gg, 1 friend of daughters, daughter's boyfriend.       Employment: retired [employer] age 69.      Tobacco; 1ppd x 50 years.  Not interested in 2019.      Alcohol: none      Exercise: none in 2019. [place]      ADLs:   independent with ADLs; no assistant devices      Advanced Directives:  None; DNR/DNI; no HCPOA.    Social Determinants of Health   Financial Resource Strain: Low Risk  (10/13/2022)   Overall Financial Resource Strain (CARDIA)    Difficulty of Paying Living Expenses: Not hard at all  Food Insecurity: No Food Insecurity (10/13/2022)   Hunger Vital Sign    Worried About Running Out of Food in the Last Year: Never true    Ran Out of Food in the Last Year: Never true  Transportation Needs: No Transportation Needs (10/13/2022)   PRAPARE - Administrator, Civil Service (Medical): No    Lack of Transportation (Non-Medical): No  Physical Activity: Patient Declined (10/13/2022)   Exercise Vital Sign    Days of Exercise per Week: Patient declined    Minutes of Exercise per Session: Patient declined  Stress: No Stress Concern Present (10/13/2022)   [employer] of Occupational Health - Occupational Stress Questionnaire    Feeling of Stress : Not at all  Social Connections: Unknown (10/13/2022)   Social Connection and Isolation Panel [NHANES]    Frequency of Communication with Friends and Family: More than three times a week     Frequency of Social Gatherings with Friends and Family: Patient declined    Attends Religious Services: Patient declined    Database administrator or Organizations: No    Attends Engineer, structural: Never    Marital Status: Divorced    Tobacco Counseling Ready to quit: Yes Counseling given: Not Answered Tobacco comments: 1 ppd   Clinical Intake:  Pre-visit preparation completed: Yes  Pain : No/denies pain     Nutritional Risks: None Diabetes: Yes CBG done?: No Did pt. bring in CBG monitor from home?: No  How often do you need to have someone help you when you read instructions, pamphlets, or other written materials from your doctor or pharmacy?: 1 - Never  Interpreter Needed?: No  Information entered by :: NAllen LPN   Activities of Daily Living    10/13/2022   11:12 AM 12/01/2021   10:52 AM  In your present state of health, do you have any difficulty performing the following activities:  Hearing? 0 0  Vision? 0 0  Difficulty concentrating or making decisions? 0 0  Walking or climbing stairs? 0 0  Dressing or bathing? 0 0  Doing errands, shopping? 0 0  Preparing Food and eating ? N N  Using the Toilet? N N  In the past six months, have you accidently leaked urine? N N  Do you have problems with loss of bowel control? N N  Managing your Medications? N N  Managing your Finances? N N  Housekeeping or managing your Housekeeping? N N    Patient Care Team: Rema Fendt, NP as PCP - General (Nurse Practitioner) Quintella Reichert, MD as PCP - Cardiology (Cardiology) [facility], [place], Jarome Lamas, [place] as Scientist, forensic  Indicate any recent Medical Services you may have received from other than [facility] providers in the  past year (date may be approximate).     Assessment:   This is a routine wellness examination for [facility].  Hearing/Vision screen Hearing Screening - Comments:: Denies hearing issues Vision  Screening - Comments:: Regular eye exams, Vision works   Goals Addressed             This Visit's Progress    Patient Stated       10/17/2022, wants to quit smoking and lose weight       Depression Screen    10/17/2022    8:19 AM 05/30/2022   10:47 AM 05/17/2022   10:22 AM 12/01/2021   10:51 AM 10/27/2021    9:50 AM 09/28/2021   10:57 AM 05/30/2021    9:34 AM  PHQ 2/9 Scores  PHQ - 2 Score 0 0 0 0 0 0 0  PHQ- 9 Score 0 0    0     Fall Risk    10/13/2022   11:12 AM 05/17/2022   10:22 AM 12/01/2021   10:52 AM 11/30/2021   11:37 AM 09/28/2021   10:51 AM  Fall Risk   Falls in the past year? 0 0 1 0 0  Number falls in past yr: 0 0 0 0 0  Injury with Fall? 0 0 0 0 0  Risk for fall due to : Medication side effect No Fall Risks No Fall Risks  No Fall Risks  Follow up Falls prevention discussed;Falls evaluation completed  Falls evaluation completed  Falls evaluation completed    MEDICARE RISK AT HOME: Medicare Risk at Home Any stairs in or around the home?: Yes If so, are there any without handrails?: No Home free of loose throw rugs in walkways, pet beds, electrical cords, etc?: Yes Life alert?: No Use of a cane, walker or w/c?: No Grab bars in the bathroom?: Yes Shower chair or bench in shower?: Yes Elevated toilet seat or a handicapped toilet?: Yes  TIMED UP AND GO:  Was the test performed?  No    Cognitive Function:    08/01/2020    8:55 AM  MMSE - Mini Mental State Exam  Orientation to time 5  Orientation to Place 5  Registration 3  Attention/ Calculation 5  Recall 3  Language- name 2 objects 2  Language- repeat 1  Language- follow 3 step command 3  Language- read & follow direction 1  Write a sentence 1  Copy design 1  Total score 30        10/17/2022    8:20 AM 12/01/2021   10:54 AM  6CIT Screen  What Year? 0 points 0 points  What month? 0 points 0 points  What time? 0 points 0 points  Count back from 20 0 points 0 points  Months in reverse 0  points 0 points  Repeat phrase 0 points 0 points  Total Score 0 points 0 points    Immunizations Immunization History  Administered Date(s) Administered   Fluad Quad(high Dose 65+) 10/14/2018, 09/28/2022   Influenza Split 09/28/2011, 09/15/2012, 09/23/2013   Influenza,inj,Quad PF,6+ Mos 09/21/2014, 09/11/2016, 09/28/2021   Influenza-Unspecified 09/29/2015, 10/16/2019   PFIZER(Purple Top)SARS-COV-2 Vaccination 03/12/2019, 04/01/2019, 10/20/2019, 05/27/2020   Pneumococcal Conjugate-13 02/07/2015   Pneumococcal Polysaccharide-23 12/20/2011   Tdap 09/21/2014   Zoster, Live 04/17/2012    TDAP status: Up to date  Flu Vaccine status: Up to date  Pneumococcal vaccine status: Up to date  Covid-19 vaccine status: Completed vaccines  Qualifies for Shingles Vaccine? Yes   Zostavax completed  Yes   Shingrix Completed?: No.    Education has been provided regarding the importance of this vaccine. Patient has been advised to call insurance company to determine out of pocket expense if they have not yet received this vaccine. Advised may also receive vaccine at local pharmacy or Health Dept. Verbalized acceptance and understanding.  Screening Tests Health Maintenance  Topic Date Due   Zoster Vaccines- Shingrix (1 of 2) 11/01/1994   FOOT EXAM  10/14/2019   Diabetic kidney evaluation - Urine ACR  08/01/2021   Lung Cancer Screening  10/25/2021   OPHTHALMOLOGY EXAM  07/01/2022   COVID-19 Vaccine (5 - 2023-24 season) 09/16/2022   HEMOGLOBIN A1C  11/17/2022   Diabetic kidney evaluation - eGFR measurement  02/03/2023   Medicare Annual Wellness (AWV)  10/17/2023   DTaP/Tdap/Td (2 - Td or Tdap) 09/20/2024   Pneumonia Vaccine 62+ Years old  Completed   INFLUENZA VACCINE  Completed   DEXA SCAN  Completed   Hepatitis C Screening  Completed   HPV VACCINES  Aged Out   Colonoscopy  Discontinued    Health Maintenance  Health Maintenance Due  Topic Date Due   Zoster Vaccines- Shingrix (1 of 2)  11/01/1994   FOOT EXAM  10/14/2019   Diabetic kidney evaluation - Urine ACR  08/01/2021   Lung Cancer Screening  10/25/2021   OPHTHALMOLOGY EXAM  07/01/2022   COVID-19 Vaccine (5 - 2023-24 season) 09/16/2022    Colorectal cancer screening: No longer required.   Mammogram status: Completed 08/27/2022. Repeat every year  Bone Density status: Completed 08/20/2016.   Lung Cancer Screening: (Low Dose CT Chest recommended if Age 71-80 years, 20 pack-year currently smoking OR have quit w/in 15years.) does qualify.   Lung Cancer Screening Referral: declines at this time  Additional Screening:  Hepatitis C Screening: does qualify; Completed 09/21/2014  Vision Screening: Recommended annual ophthalmology exams for early detection of glaucoma and other disorders of the eye. Is the patient up to date with their annual eye exam?  Yes  Who is the provider or what is the name of the office in which the patient attends annual eye exams? [facility] If pt is not established with a provider, would they like to be referred to a provider to establish care? No .   Dental Screening: Recommended annual dental exams for proper oral hygiene  Diabetic Foot Exam: Diabetic Foot Exam: Overdue, Pt has been advised about the importance in completing this exam. Pt is scheduled for diabetic foot exam on next appointment.  Community Resource Referral / Chronic Care Management: CRR required this visit?  No   CCM required this visit?  No     Plan:     I have personally reviewed and noted the following in the patient's chart:   Medical and social history Use of alcohol, tobacco or illicit drugs  Current medications and supplements including opioid prescriptions. Patient is not currently taking opioid prescriptions. Functional ability and status Nutritional status Physical activity Advanced directives List of other physicians Hospitalizations, surgeries, and ER visits in previous 12 months Vitals Screenings  to include cognitive, depression, and falls Referrals and appointments  In addition, I have reviewed and discussed with patient certain preventive protocols, quality metrics, and best practice recommendations. A written personalized care plan for preventive services as well as general preventive health recommendations were provided to patient.     Barb Merino, LPN   16/01/958   After Visit Summary: (MyChart) Due to this being a telephonic visit,  the after visit summary with patients personalized plan was offered to patient via MyChart   Nurse Notes: none

## 2022-10-17 NOTE — Patient Instructions (Signed)
Ms. [name] , Thank you for taking time to come for your Medicare Wellness Visit. I appreciate your ongoing commitment to your health goals. Please review the following plan we discussed and let me know if I can assist you in the future.   Referrals/Orders/Follow-Ups/Clinician Recommendations: none  This is a list of the screening recommended for you and due dates:  Health Maintenance  Topic Date Due   Zoster (Shingles) Vaccine (1 of 2) 11/01/1994   Complete foot exam   10/14/2019   Yearly kidney health urinalysis for diabetes  08/01/2021   Screening for Lung Cancer  10/25/2021   Eye exam for diabetics  07/01/2022   COVID-19 Vaccine (5 - 2023-24 season) 09/16/2022   Hemoglobin A1C  11/17/2022   Yearly kidney function blood test for diabetes  02/03/2023   Medicare Annual Wellness Visit  10/17/2023   DTaP/Tdap/Td vaccine (2 - Td or Tdap) 09/20/2024   Pneumonia Vaccine  Completed   Flu Shot  Completed   DEXA scan (bone density measurement)  Completed   Hepatitis C Screening  Completed   HPV Vaccine  Aged Out   Colon Cancer Screening  Discontinued    Advanced directives: (In Chart) A copy of your advanced directives are scanned into your chart should your provider ever need it.  Next Medicare Annual Wellness Visit scheduled for next year: No, schedule not open for next year  Insert Preventive Care attachment Insert FALL PREVENTION attachment if needed

## 2022-10-24 ENCOUNTER — Encounter: Payer: Self-pay | Admitting: Cardiology

## 2022-10-24 ENCOUNTER — Ambulatory Visit: Payer: Medicare Other | Attending: Cardiology | Admitting: Cardiology

## 2022-10-24 ENCOUNTER — Other Ambulatory Visit: Payer: Self-pay | Admitting: Family

## 2022-10-24 VITALS — BP 164/62 | HR 105 | Ht 67.0 in | Wt 168.8 lb

## 2022-10-24 DIAGNOSIS — I351 Nonrheumatic aortic (valve) insufficiency: Secondary | ICD-10-CM

## 2022-10-24 DIAGNOSIS — I10 Essential (primary) hypertension: Secondary | ICD-10-CM

## 2022-10-24 DIAGNOSIS — E119 Type 2 diabetes mellitus without complications: Secondary | ICD-10-CM

## 2022-10-24 DIAGNOSIS — I2510 Atherosclerotic heart disease of native coronary artery without angina pectoris: Secondary | ICD-10-CM

## 2022-10-24 DIAGNOSIS — E7800 Pure hypercholesterolemia, unspecified: Secondary | ICD-10-CM

## 2022-10-24 DIAGNOSIS — F17210 Nicotine dependence, cigarettes, uncomplicated: Secondary | ICD-10-CM | POA: Diagnosis not present

## 2022-10-24 DIAGNOSIS — I6523 Occlusion and stenosis of bilateral carotid arteries: Secondary | ICD-10-CM

## 2022-10-24 DIAGNOSIS — Z716 Tobacco abuse counseling: Secondary | ICD-10-CM

## 2022-10-24 NOTE — Patient Instructions (Signed)
Medication Instructions:  Your physician recommends that you continue on your current medications as directed. Please refer to the Current Medication list given to you today.  *If you need a refill on your cardiac medications before your next appointment, please call your pharmacy*   Lab Work: None.  If you have labs (blood work) drawn today and your tests are completely normal, you will receive your results only by: MyChart Message (if you have MyChart) OR A paper copy in the mail If you have any lab test that is abnormal or we need to change your treatment, we will call you to review the results.   Testing/Procedures: Your physician has requested that you have an echocardiogram. Echocardiography is a painless test that uses sound waves to create images of your heart. It provides your doctor with information about the size and shape of your heart and how well your heart's chambers and valves are working. This procedure takes approximately one hour. There are no restrictions for this procedure. Please do NOT wear cologne, perfume, aftershave, or lotions (deodorant is allowed). Please arrive 15 minutes prior to your appointment time.  Your physician has requested that you have a carotid duplex. This test is an ultrasound of the carotid arteries in your neck. It looks at blood flow through these arteries that supply the brain with blood. Allow one hour for this exam. There are no restrictions or special instructions.    Follow-Up:   Your next appointment:   1 year(s)  Provider:   Armanda Magic, MD     Other Instructions Your physician has ordered a 24 hour blood pressure cuff. Someone will call you to set up your monitor.

## 2022-10-24 NOTE — Addendum Note (Signed)
Addended by: Rexene Edison L on: 10/24/2022 10:00 AM   Modules accepted: Orders

## 2022-10-24 NOTE — Progress Notes (Addendum)
Cardiology  Note    Date:  10/24/2022   ID:  [name], DOB [birth year], MRN [id number]  PCP:  Rema Fendt, NP  Cardiologist:  Armanda Magic, MD   Chief Complaint  Patient presents with   Coronary Artery Disease   Hypertension   Hyperlipidemia   Aortic Insuffiency    History of Present Illness:  [name] is a 77 y.o. female  with a hx of COPD, HTN, HLD, DM and DOE.  When I initially saw her she was complaining of significant shortness of breath.  2D echo in June 2022 showed normal LV function with EF 65 to 70% with moderate LVH, grade 1 diastolic dysfunction normal pulmonary pressures trivial MR and mild to moderate AR.    She underwent coronary CTA showing severe three-vessel coronary calcification with 1 to 44% ostial left main, 70-99% long calcified plaque in the ostial LAD through the proximal segment, 25 to 49% mid LAD stenosis and 1 to 24% distal LAD stenosis.  There was also 25 to 49% proximal circumflex stenosis and 70 to 99% mid RCA stenosis.  Cardiac cath done 11/18/2020 showed 20% mid RCA and RPDA, 35% ostial to proximal LAD, 5% ostial left circumflex and 20% mid LAD with normal LV function.  The EDP was normal at 13 mmHg.  Medical therapy was recommended.  She is here today for followup and is doing well.  She has chronic DOE due to COPD which is very stable. She denies any chest pain or pressure, PND, orthopnea, LE edema, dizziness, palpitations or syncope. She is compliant with her meds and is tolerating meds with no SE.    Past Medical History:  Diagnosis Date   CAD (coronary artery disease), native coronary artery 11/18/2020   Cardiac cath done 11/18/2020 showed 20% mid RCA and RPDA, 35% ostial to proximal LAD, 5% ostial left circumflex and 20% mid LAD   Carotid stenosis    1-39% bilateral stenosis   COPD (chronic obstructive pulmonary disease) (HCC)    Diabetes mellitus    Hyperlipidemia    Hypertension    Night sweats    Rectal polyp     Past Surgical  History:  Procedure Laterality Date   ABDOMINAL HYSTERECTOMY  2000   fibroids; ovaries intact.   BACK SURGERY     plate put in back   CHOLECYSTECTOMY N/A 05/01/2017   Procedure: LAPAROSCOPIC CHOLECYSTECTOMY WITH INTRAOPERATIVE CHOLANGIOGRAM;  Surgeon: Manus Rudd, MD;  Location: [facility] OR;  Service: General;  Laterality: N/A;   LEFT HEART CATH AND CORONARY ANGIOGRAPHY N/A 11/18/2020   Procedure: LEFT HEART CATH AND CORONARY ANGIOGRAPHY;  Surgeon: Lennette Bihari, MD;  Location: [facility];  Service: Cardiovascular;  Laterality: N/A;   RECTAL POLYPECTOMY  05/16/2010   TVAdenoma polyp   TUBAL LIGATION      Current Medications: Current Meds  Medication Sig   ACCU-CHEK GUIDE test strip USE AS DIRECTED   Accu-Chek Softclix Lancets lancets USE UP TO 4 TIMES DAILY AS  DIRECTED   acetaminophen (TYLENOL) 500 MG tablet Take 500-1,000 mg by mouth daily as needed (pain).   albuterol (VENTOLIN HFA) 108 (90 Base) MCG/ACT inhaler Inhale 2 puffs into the lungs every 6 (six) hours as needed for wheezing or shortness of breath.   amLODipine (NORVASC) 5 MG tablet Take 1 tablet (5 mg total) by mouth daily.   aspirin EC 81 MG tablet Take 81 mg by mouth daily. Swallow whole.   blood glucose meter kit and  supplies KIT Dispense based on patient and insurance preference. Use up to four times daily as directed. (FOR ICD-9 250.00, 250.01).   blood glucose meter kit and supplies Dispense based on insurance preference. Use up to four times daily as directed. (FOR ICD-10 E11.9 E11.65) AccuCheck Meter       Cholecalciferol (VITAMIN D3) 50 MCG (2000 UT) TABS Take 2,000 Units by mouth in the morning.   empagliflozin (JARDIANCE) 10 MG TABS tablet Take 10 mg by mouth daily.   insulin glargine (LANTUS SOLOSTAR) 100 UNIT/ML Solostar Pen Inject 45 Units into the skin at bedtime.   Insulin Pen Needle (BD PEN NEEDLE NANO U/F) 32G X 4 MM MISC Inject 1 Dose into the skin daily.   lisinopril (ZESTRIL) 40 MG tablet Take 1  tablet (40 mg total) by mouth daily.   metoprolol succinate (TOPROL-XL) 25 MG 24 hr tablet Take 1 tablet (25 mg total) by mouth daily.   pravastatin (PRAVACHOL) 80 MG tablet Take 1 tablet (80 mg total) by mouth daily.   sitaGLIPtin-metformin (JANUMET) 50-1000 MG tablet Take 1 tablet by mouth 2 (two) times daily with a meal.   TRELEGY ELLIPTA 200-62.5-25 MCG/ACT AEPB USE 1 INHALATION BY MOUTH DAILY   Current Facility-Administered Medications for the 10/24/22 encounter (Office Visit) with Quintella Reichert, MD  Medication   sodium chloride flush (NS) 0.9 % injection 3 mL    Allergies:   Patient has no known allergies.   Social History   Socioeconomic History   Marital status: Divorced    Spouse name: Not on file   Number of children: 3   Years of education: Not on file   Highest education level: GED or equivalent  Occupational History   Occupation: retired    Comment: [employer]  Tobacco Use   Smoking status: Every Day    Current packs/day: 2.50    Average packs/day: 2.5 packs/day for 60.0 years (150.0 ttl pk-yrs)    Types: Cigarettes    Passive exposure: Current   Smokeless tobacco: Never   Tobacco comments:    1 ppd  Vaping Use   Vaping status: Never Used  Substance and Sexual Activity   Alcohol use: No   Drug use: No   Sexual activity: Not on file  Other Topics Concern   Not on file  Social History Narrative   Marital status: divorced; not dating in 2019 but interested slightly.        Children:  3 children; 7 seven grandchildren; 1 gg      Lives: with oldest daughter, 2 grandchildren, 1 gg, 1 friend of daughters, daughter's boyfriend.       Employment: retired [employer] age 38.      Tobacco; 1ppd x 50 years.  Not interested in 2019.      Alcohol: none      Exercise: none in 2019. [place]      ADLs:   independent with ADLs; no assistant devices      Advanced Directives:  None; DNR/DNI; no HCPOA.    Social Determinants of Health    Financial Resource Strain: Low Risk  (10/13/2022)   Overall Financial Resource Strain (CARDIA)    Difficulty of Paying Living Expenses: Not hard at all  Food Insecurity: No Food Insecurity (10/13/2022)   Hunger Vital Sign    Worried About Running Out of Food in the Last Year: Never true    Ran Out of Food in the Last Year: Never true  Transportation Needs: No Transportation Needs (10/13/2022)   PRAPARE - Administrator, Civil Service (Medical): No    Lack of Transportation (Non-Medical): No  Physical Activity: Patient Declined (10/13/2022)   Exercise Vital Sign    Days of Exercise per Week: Patient declined    Minutes of Exercise per Session: Patient declined  Stress: No Stress Concern Present (10/13/2022)   [employer] of Occupational Health - Occupational Stress Questionnaire    Feeling of Stress : Not at all  Social Connections: Unknown (10/13/2022)   Social Connection and Isolation Panel [NHANES]    Frequency of Communication with Friends and Family: More than three times a week    Frequency of Social Gatherings with Friends and Family: Patient declined    Attends Religious Services: Patient declined    Database administrator or Organizations: No    Attends Engineer, structural: Never    Marital Status: Divorced     Family History:  The patient's family history includes Cancer (age of onset: 48) in her father; Cancer (age of onset: 24) in her mother; Diabetes in her brother, maternal grandmother, mother, and sister; Heart disease (age of onset: 2) in her mother; Hyperlipidemia in her mother; Hypertension in her brother; Mental illness in her sister.   ROS:   Please see the history of present illness.    ROS All other systems reviewed and are negative.      No data to display          PHYSICAL EXAM:   VS:  BP (!) 164/62   Pulse (!) 105   Ht 5\' 7"  (1.702 m)   Wt 168 lb 12.8 oz (76.6 kg)   SpO2 99%   BMI 26.44 kg/m    GEN: Well nourished,  well developed in no acute distress HEENT: Normal NECK: No JVD; No carotid bruits LYMPHATICS: No lymphadenopathy CARDIAC:RRR, no murmurs, rubs, gallops RESPIRATORY:  Clear to auscultation without rales, wheezing or rhonchi  ABDOMEN: Soft, non-tender, non-distended MUSCULOSKELETAL:  No edema; No deformity  SKIN: Warm and dry NEUROLOGIC:  Alert and oriented x 3 PSYCHIATRIC:  Normal affect  Wt Readings from Last 3 Encounters:  10/24/22 168 lb 12.8 oz (76.6 kg)  05/30/22 172 lb (78 kg)  05/17/22 174 lb 3.2 oz (79 kg)      Studies/Labs Reviewed:       Recent Labs: 02/02/2022: BUN 16; Creatinine, Ser 1.12; Hemoglobin 13.6; Platelets 258; Potassium 4.6; Sodium 139 02/09/2022: TSH 1.090   Lipid Panel    Component Value Date/Time   CHOL 127 01/18/2021 0827   TRIG 125 01/18/2021 0827   HDL 42 01/18/2021 0827   CHOLHDL 3.0 01/18/2021 0827   CHOLHDL 3.0 02/07/2015 1314   VLDL 31 (H) 02/07/2015 1314   LDLCALC 63 01/18/2021 0827   LDLDIRECT 85 04/17/2012 1349  Additional studies/ records that were reviewed today include:  OV notes from PCP  ASSESSMENT:    1. Coronary artery disease involving native coronary artery of native heart without angina pectoris   2. Primary hypertension   3. Pure hypercholesterolemia   4. Nonrheumatic aortic valve insufficiency   5. Bilateral carotid artery stenosis   6. Tobacco abuse counseling       PLAN:  In order of problems listed above:  ASCAD -coronary CTA showing severe three-vessel coronary calcification with 1 to 44% ostial left main, 7099% long calcified plaque in the ostial LAD through the proximal segment, 25 to 49% mid LAD stenosis and 1 to  24% distal LAD stenosis.  There was also 25 to 49% proximal circumflex stenosis and 70 to 99% mid RCA stenosis.  -Cardiac cath done 11/18/2020 showed 20% mid RCA and RPDA, 35% ostial to proximal LAD, 5% ostial left circumflex and 20% mid LAD with normal LV function and normal LVEDP was normal at 13  mmHg.  -She has not had any anginal symptoms since I saw her last.  She has chronic dyspnea on exertion related to ongoing tobacco abuse -Continue prescription drug management with aspirin 81 mg daily, Toprol-XL 25 mg daily, pravastatin 80 mg daily and amlodipine 5 mg daily with as needed refills  2.  HTN -BP is borderline controlled on exam today>>she says that she was rushing to get her>>she does not have a BP monitor at home -Continue prescription drug management with amlodipine 5 mg daily, lisinopril 40 mg daily Toprol-XL 25 mg daily with as needed refills -I have personally reviewed and interpreted outside labs performed by patient's PCP which showed serum creatinine 1.12 and potassium 4.6 on 02/02/2022 -I will check a 24 hour BP monitor  3.  HLD -With her new diagnosis of CAD her LDL goal is now less than 70 -I have personally reviewed and interpreted outside labs performed by patient's PCP which showed LDL 63 and HDL 42 on 01/18/2021 -Continue drug management with pravastatin 80 mg daily with as needed refills  4.  Aortic insufficiency -mild to moderate by echo with mild AVSC June 2022 -Repeat 2D echo to make sure this is stable  5.  Carotid artery stenosis -dopplers 11/2020 showed 1-39% bilateral stenosis -continue ASA and statin -Repeat Dopplers   6.  Ongoing tobacco abuse -she is down to <1ppd -we discussed the smoking cessation drugs including Chantix and Wellbutrin -she thinks she has tried Wellbutrin in the past and did not like it -for now she wants to avoid meds and wants to slowly wean down  Time Spent: 20 minutes total time of encounter, including 15 minutes spent in face-to-face patient care on the date of this encounter. This time includes coordination of care and counseling regarding above mentioned problem list. Remainder of non-face-to-face time involved reviewing chart documents/testing relevant to the patient encounter and documentation in the medical record. I  have independently reviewed documentation from referring provider  Medication Adjustments/Labs and Tests Ordered: Current medicines are reviewed at length with the patient today.  Concerns regarding medicines are outlined above.  Medication changes, Labs and Tests ordered today are listed in the Patient Instructions below.  There are no Patient Instructions on file for this visit.  Followup with me in 1 year for AI   Signed, Armanda Magic, MD  10/24/2022 9:50 AM    [facility] [address] [place], [place], [place]  [address] Phone: [phone]; Fax: [phone]

## 2022-10-24 NOTE — Telephone Encounter (Signed)
Requested Prescriptions  Pending Prescriptions Disp Refills   ACCU-CHEK GUIDE test strip [Pharmacy Med Name: Accu-Chek Guide In Vitro Strip] 400 strip 2    Sig: USE AS DIRECTED     Endocrinology: Diabetes - Testing Supplies Passed - 10/24/2022  4:17 AM      Passed - Valid encounter within last 12 months    Recent Outpatient Visits           2 months ago Primary hypertension   [facility], Amy J, NP   4 months ago Type 2 diabetes mellitus without complication, with long-term current use of insulin (HCC)   [place], Amy J, NP   5 months ago Type 2 diabetes mellitus without complication, with long-term current use of insulin (HCC)   [facility], Amy J, NP   8 months ago Type 2 diabetes mellitus without complication, with long-term current use of insulin (HCC)   [place] [facility], Amy J, NP   12 months ago Type 2 diabetes mellitus without complication, with long-term current use of insulin [facility])    [facility] [place], NP       Future Appointments             In 4 weeks Rema Fendt, NP [facility]

## 2022-11-05 ENCOUNTER — Ambulatory Visit (HOSPITAL_COMMUNITY)
Admission: RE | Admit: 2022-11-05 | Discharge: 2022-11-05 | Disposition: A | Discharge status: Home / Self Care | Payer: Medicare Other | Source: Ambulatory Visit | Attending: Cardiology | Admitting: Cardiology

## 2022-11-05 DIAGNOSIS — I6523 Occlusion and stenosis of bilateral carotid arteries: Secondary | ICD-10-CM | POA: Diagnosis present

## 2022-11-06 ENCOUNTER — Telehealth: Payer: Self-pay

## 2022-11-06 DIAGNOSIS — I6523 Occlusion and stenosis of bilateral carotid arteries: Secondary | ICD-10-CM

## 2022-11-06 NOTE — Telephone Encounter (Signed)
-----   Message from Armanda Magic sent at 11/05/2022  6:03 PM EDT ----- Dopplers showed 1-39% bilateral carotid stenosis - repeat dopplers in 1 year

## 2022-11-06 NOTE — Telephone Encounter (Signed)
Call to patient to discuss carotid doppler results, patient verbalizes understanding of 1-39% stenosis, agrees to repeat dopplers in 1 year. Orders placed.

## 2022-11-15 ENCOUNTER — Ambulatory Visit (HOSPITAL_COMMUNITY): Payer: Medicare Other | Attending: Internal Medicine

## 2022-11-15 DIAGNOSIS — I351 Nonrheumatic aortic (valve) insufficiency: Secondary | ICD-10-CM | POA: Insufficient documentation

## 2022-11-15 LAB — ECHOCARDIOGRAM COMPLETE
Area-P 1/2: 3.1 cm2
Est EF: >75
P 1/2 time: 377 ms
S' Lateral: 1.7 cm

## 2022-11-19 ENCOUNTER — Telehealth: Payer: Self-pay

## 2022-11-19 DIAGNOSIS — I421 Obstructive hypertrophic cardiomyopathy: Secondary | ICD-10-CM

## 2022-11-19 NOTE — Telephone Encounter (Signed)
Call to patient to discuss echo which showed hyperdynamic LV function EF at greater than 75%.  There is severe thickening of the heart muscle, trivial leakiness of the mitral valve and mild to moderate leakiness of the tricuspid valve.  Mild to moderate leakiness of the aortic valve is present.  Compared to echo 2022 heart muscle thickness has increased.  Dr. Mayford Knife recommends cardiac MRI to rule out HOCM.    Patient did have some type of plate put in her back in 2008, also states she has an MRI compatible appliance or implant that was placed in April or May 2024 in [place], [place] but can't tell me exactly what. Patient did have brain MRI in jan 2024.

## 2022-11-19 NOTE — Telephone Encounter (Signed)
-----   Message from [name] sent at 11/19/2022 12:20 PM EST ----- Echo showed hyperdynamic LV function EF really good at greater than 75%.  There is severe thickening of the heart muscle with trivial leakiness of the mitral valve and mild to moderate leakiness of the tricuspid valve.  Mild to moderate leakiness of the aortic valve.  Compared to echo 2022 no change in the degree of aortic valve leakiness but heart muscle thickness has increased.  Recommend cardiac MRI to rule out HOCM.  She did have some type of plate put in her back so please find out from radiology whether this will be an issue.  She has had several MRIs done 1 in the past year on her brain so I suspect it will not be an issue

## 2022-11-20 ENCOUNTER — Other Ambulatory Visit: Payer: Self-pay | Admitting: Family

## 2022-11-20 DIAGNOSIS — E119 Type 2 diabetes mellitus without complications: Secondary | ICD-10-CM

## 2022-11-21 NOTE — Telephone Encounter (Signed)
Established with Endocrinology. Request refills from the same. Please let me know if I can further assist. Thank you.

## 2022-11-22 ENCOUNTER — Encounter: Payer: Medicare Other | Admitting: Family

## 2022-11-22 NOTE — Progress Notes (Signed)
Erroneous encounter-disregard

## 2022-11-22 NOTE — Telephone Encounter (Signed)
Established with Endocrinology. Request refills from the same. Please let me know if I can further assist. Thank you.

## 2022-11-30 NOTE — Telephone Encounter (Signed)
After speaking with radiology department and with patient, asked patient to bring in all implant documentation, particularly from her implant placed in [place] in April or May of 2024. Patient verbalizes understanding and agrees to plan.

## 2022-12-03 ENCOUNTER — Other Ambulatory Visit: Payer: Self-pay | Admitting: Cardiology

## 2022-12-03 DIAGNOSIS — I6523 Occlusion and stenosis of bilateral carotid arteries: Secondary | ICD-10-CM

## 2022-12-03 DIAGNOSIS — I10 Essential (primary) hypertension: Secondary | ICD-10-CM

## 2022-12-03 DIAGNOSIS — I351 Nonrheumatic aortic (valve) insufficiency: Secondary | ICD-10-CM

## 2022-12-03 DIAGNOSIS — R030 Elevated blood-pressure reading, without diagnosis of hypertension: Secondary | ICD-10-CM

## 2022-12-03 DIAGNOSIS — I2510 Atherosclerotic heart disease of native coronary artery without angina pectoris: Secondary | ICD-10-CM

## 2022-12-03 DIAGNOSIS — E7800 Pure hypercholesterolemia, unspecified: Secondary | ICD-10-CM

## 2022-12-03 DIAGNOSIS — Z716 Tobacco abuse counseling: Secondary | ICD-10-CM

## 2022-12-05 NOTE — Telephone Encounter (Signed)
Patient brought in implant info, which was faxed to [facility] [place], [place].

## 2022-12-06 ENCOUNTER — Other Ambulatory Visit: Payer: Self-pay | Admitting: Pulmonary Disease

## 2022-12-14 ENCOUNTER — Other Ambulatory Visit: Payer: Self-pay | Admitting: Family

## 2022-12-14 DIAGNOSIS — I10 Essential (primary) hypertension: Secondary | ICD-10-CM

## 2022-12-17 ENCOUNTER — Other Ambulatory Visit: Payer: Self-pay

## 2022-12-17 DIAGNOSIS — I10 Essential (primary) hypertension: Secondary | ICD-10-CM

## 2022-12-17 MED ORDER — METOPROLOL SUCCINATE ER 25 MG PO TB24: 25.0000 mg | ORAL_TABLET | Freq: Every day | ORAL | 0 refills | Status: DC

## 2022-12-18 ENCOUNTER — Ambulatory Visit: Payer: Medicare Other

## 2022-12-20 ENCOUNTER — Other Ambulatory Visit: Payer: Self-pay | Admitting: Family

## 2022-12-20 DIAGNOSIS — I2510 Atherosclerotic heart disease of native coronary artery without angina pectoris: Secondary | ICD-10-CM

## 2022-12-20 DIAGNOSIS — I10 Essential (primary) hypertension: Secondary | ICD-10-CM

## 2022-12-20 DIAGNOSIS — E7800 Pure hypercholesterolemia, unspecified: Secondary | ICD-10-CM

## 2022-12-21 NOTE — Telephone Encounter (Signed)
Complete

## 2023-01-22 DIAGNOSIS — E1165 Type 2 diabetes mellitus with hyperglycemia: Secondary | ICD-10-CM | POA: Diagnosis not present

## 2023-01-22 DIAGNOSIS — E785 Hyperlipidemia, unspecified: Secondary | ICD-10-CM | POA: Diagnosis not present

## 2023-01-22 DIAGNOSIS — N1831 Chronic kidney disease, stage 3a: Secondary | ICD-10-CM | POA: Diagnosis not present

## 2023-01-22 DIAGNOSIS — I10 Essential (primary) hypertension: Secondary | ICD-10-CM | POA: Diagnosis not present

## 2023-01-23 ENCOUNTER — Encounter (HOSPITAL_COMMUNITY): Payer: Self-pay

## 2023-01-25 ENCOUNTER — Other Ambulatory Visit (HOSPITAL_COMMUNITY): Payer: Medicare Other

## 2023-01-28 ENCOUNTER — Ambulatory Visit (HOSPITAL_COMMUNITY): Payer: 59

## 2023-01-28 ENCOUNTER — Encounter (HOSPITAL_COMMUNITY): Payer: Self-pay

## 2023-01-28 ENCOUNTER — Telehealth: Payer: Self-pay | Admitting: Family

## 2023-01-28 NOTE — Telephone Encounter (Signed)
 [name]

## 2023-03-10 ENCOUNTER — Encounter: Payer: Self-pay | Admitting: Pulmonary Disease

## 2023-03-17 ENCOUNTER — Other Ambulatory Visit: Payer: Self-pay | Admitting: Family

## 2023-03-17 DIAGNOSIS — I10 Essential (primary) hypertension: Secondary | ICD-10-CM

## 2023-03-17 DIAGNOSIS — E7800 Pure hypercholesterolemia, unspecified: Secondary | ICD-10-CM

## 2023-03-17 DIAGNOSIS — I2510 Atherosclerotic heart disease of native coronary artery without angina pectoris: Secondary | ICD-10-CM

## 2023-03-18 ENCOUNTER — Other Ambulatory Visit: Payer: Self-pay

## 2023-03-18 DIAGNOSIS — E7800 Pure hypercholesterolemia, unspecified: Secondary | ICD-10-CM

## 2023-03-18 DIAGNOSIS — I10 Essential (primary) hypertension: Secondary | ICD-10-CM

## 2023-03-18 DIAGNOSIS — I2510 Atherosclerotic heart disease of native coronary artery without angina pectoris: Secondary | ICD-10-CM

## 2023-03-18 MED ORDER — AMLODIPINE BESYLATE 5 MG PO TABS: 5.0000 mg | ORAL_TABLET | Freq: Every day | ORAL | 0 refills | Status: DC

## 2023-03-18 MED ORDER — LISINOPRIL 40 MG PO TABS: 40.0000 mg | ORAL_TABLET | Freq: Every day | ORAL | 0 refills | Status: DC

## 2023-03-18 MED ORDER — PRAVASTATIN SODIUM 80 MG PO TABS: 80.0000 mg | ORAL_TABLET | Freq: Every day | ORAL | 0 refills | Status: AC

## 2023-04-04 ENCOUNTER — Other Ambulatory Visit: Payer: Self-pay | Admitting: Pulmonary Disease

## 2023-04-23 LAB — HEMOGLOBIN A1C: Hemoglobin A1C: 7.6

## 2023-05-15 DIAGNOSIS — R194 Change in bowel habit: Secondary | ICD-10-CM | POA: Diagnosis not present

## 2023-05-15 DIAGNOSIS — R197 Diarrhea, unspecified: Secondary | ICD-10-CM | POA: Diagnosis not present

## 2023-05-22 ENCOUNTER — Ambulatory Visit: Admitting: Pulmonary Disease

## 2023-05-22 ENCOUNTER — Encounter: Payer: Self-pay | Admitting: Pulmonary Disease

## 2023-05-22 VITALS — BP 147/79 | HR 94 | Ht 67.0 in | Wt 169.8 lb

## 2023-05-22 DIAGNOSIS — J439 Emphysema, unspecified: Secondary | ICD-10-CM

## 2023-05-22 DIAGNOSIS — J431 Panlobular emphysema: Secondary | ICD-10-CM

## 2023-05-22 DIAGNOSIS — J449 Chronic obstructive pulmonary disease, unspecified: Secondary | ICD-10-CM

## 2023-05-22 DIAGNOSIS — E1165 Type 2 diabetes mellitus with hyperglycemia: Secondary | ICD-10-CM | POA: Diagnosis not present

## 2023-05-22 MED ORDER — TRELEGY ELLIPTA 200-62.5-25 MCG/ACT IN AEPB: 1.0000 | INHALATION_SPRAY | Freq: Every day | RESPIRATORY_TRACT | 5 refills | Status: DC

## 2023-05-22 MED ORDER — ALBUTEROL SULFATE HFA 108 (90 BASE) MCG/ACT IN AERS
2.0000 | INHALATION_SPRAY | Freq: Four times a day (QID) | RESPIRATORY_TRACT | 3 refills | Status: AC | PRN
Start: 2023-05-22 — End: ?

## 2023-05-22 NOTE — Patient Instructions (Signed)
 I will see you in about 6 months  Continue with your Trelegy - Will make sure you have refills  Prescription for albuterol  as needed  Continue to work on cutting down smoking

## 2023-05-22 NOTE — Progress Notes (Signed)
 [name]    [id number]    [birth year]  Primary Care Physician:[name], [name], NP  Referring Physician: [name], NP [address] [place],  [place] [address]  Chief complaint:   Patient seen for shortness of breath  HPI:  Breathing has been relatively okay  Uses Trelegy regularly  She still smokes just about less than a pack a day  Compliant with Trelegy  She does try to stay active, does get short of breath with exertion  Occasional cough, not really bringing up any secretions  No chest pains or chest discomfort  History of diabetes, hypertension-working on better control  Parents did smoke   Outpatient Encounter Medications as of 05/22/2023  Medication Sig   ACCU-CHEK GUIDE test strip USE AS DIRECTED   Accu-Chek Softclix Lancets lancets USE UP TO 4 TIMES DAILY AS  DIRECTED   acetaminophen  (TYLENOL ) 500 MG tablet Take 500-1,000 mg by mouth daily as needed (pain).   amLODipine  (NORVASC ) 5 MG tablet TAKE 1 TABLET BY MOUTH DAILY   aspirin  EC 81 MG tablet Take 81 mg by mouth daily. Swallow whole.   blood glucose meter kit and supplies KIT Dispense based on patient and insurance preference. Use up to four times daily as directed. (FOR ICD-9 250.00, 250.01).   blood glucose meter kit and supplies Dispense based on insurance preference. Use up to four times daily as directed. (FOR ICD-10 E11.9 E11.65) AccuCheck Meter       Cholecalciferol (VITAMIN D3) 50 MCG (2000 UT) TABS Take 2,000 Units by mouth in the morning.   empagliflozin (JARDIANCE) 10 MG TABS tablet Take 10 mg by mouth daily.   insulin  glargine (LANTUS  SOLOSTAR) 100 UNIT/ML Solostar Pen Inject 45 Units into the skin at bedtime.   Insulin  Pen Needle (BD PEN NEEDLE NANO U/F) 32G X 4 MM MISC Inject 1 Dose into the skin daily.   lisinopril  (ZESTRIL ) 40 MG tablet TAKE 1 TABLET BY MOUTH DAILY   lisinopril  (ZESTRIL ) 40 MG tablet Take 1 tablet (40 mg total) by mouth daily.   metoprolol   succinate (TOPROL -XL) 25 MG 24 hr tablet TAKE 1 TABLET BY MOUTH DAILY   pravastatin  (PRAVACHOL ) 80 MG tablet TAKE 1 TABLET BY MOUTH DAILY   pravastatin  (PRAVACHOL ) 80 MG tablet Take 1 tablet (80 mg total) by mouth daily.   sitaGLIPtin -metformin  (JANUMET ) 50-1000 MG tablet Take 1 tablet by mouth 2 (two) times daily with a meal.   [DISCONTINUED] Fluticasone -Umeclidin-Vilant (TRELEGY ELLIPTA ) 200-62.5-25 MCG/ACT AEPB USE 1 INHALATION BY MOUTH DAILY   albuterol  (VENTOLIN  HFA) 108 (90 Base) MCG/ACT inhaler Inhale 2 puffs into the lungs every 6 (six) hours as needed for wheezing or shortness of breath.   Fluticasone -Umeclidin-Vilant (TRELEGY ELLIPTA ) 200-62.5-25 MCG/ACT AEPB Inhale 1 puff into the lungs daily.   metoprolol  succinate (TOPROL -XL) 25 MG 24 hr tablet Take 1 tablet (25 mg total) by mouth daily.   [DISCONTINUED] albuterol  (VENTOLIN  HFA) 108 (90 Base) MCG/ACT inhaler Inhale 2 puffs into the lungs every 6 (six) hours as needed for wheezing or shortness of breath. (Patient not taking: Reported on 05/22/2023)   [DISCONTINUED] amLODipine  (NORVASC ) 5 MG tablet Take 1 tablet (5 mg total) by mouth daily.   Facility-Administered Encounter Medications as of 05/22/2023  Medication   sodium chloride  flush (NS) 0.9 % injection 3 mL    Allergies as of 05/22/2023   (No Known Allergies)    Past Medical History:  Diagnosis Date   CAD (  coronary artery disease), native coronary artery 11/18/2020   Cardiac cath done 11/18/2020 showed 20% mid RCA and RPDA, 35% ostial to proximal LAD, 5% ostial left circumflex and 20% mid LAD   Carotid stenosis    1-39% bilateral stenosis   COPD (chronic obstructive pulmonary disease) (HCC)    Diabetes mellitus    Hyperlipidemia    Hypertension    Night sweats    Rectal polyp     Past Surgical History:  Procedure Laterality Date   ABDOMINAL HYSTERECTOMY  2000   fibroids; ovaries intact.   BACK SURGERY     plate put in back   CHOLECYSTECTOMY N/A 05/01/2017    Procedure: LAPAROSCOPIC CHOLECYSTECTOMY WITH INTRAOPERATIVE CHOLANGIOGRAM;  Surgeon: Dareen Ebbing, MD;  Location: [facility];  Service: General;  Laterality: N/A;   LEFT HEART CATH AND CORONARY ANGIOGRAPHY N/A 11/18/2020   Procedure: LEFT HEART CATH AND CORONARY ANGIOGRAPHY;  Surgeon: Millicent Ally, MD;  Location: [facility] INVASIVE CV LAB;  Service: Cardiovascular;  Laterality: N/A;   RECTAL POLYPECTOMY  05/16/2010   TVAdenoma polyp   TUBAL LIGATION      Family History  Problem Relation Age of Onset   Diabetes Sister    Diabetes Brother    Hypertension Brother    Cancer Mother 60       colon(age 67) and lung (age 75)   Diabetes Mother    Heart disease Mother 64       CABG   Hyperlipidemia Mother    Cancer Father 60       stomach   Diabetes Maternal Grandmother    Mental illness Sister     Social History   Socioeconomic History   Marital status: Divorced    Spouse name: Not on file   Number of children: 3   Years of education: Not on file   Highest education level: GED or equivalent  Occupational History   Occupation: retired    Comment: [employer]  Tobacco Use   Smoking status: Every Day    Current packs/day: 2.50    Average packs/day: 2.5 packs/day for 60.0 years (150.0 ttl pk-yrs)    Types: Cigarettes    Passive exposure: Current   Smokeless tobacco: Never   Tobacco comments:    1 ppd daily 05/22/23  Vaping Use   Vaping status: Never Used  Substance and Sexual Activity   Alcohol use: No   Drug use: No   Sexual activity: Not on file  Other Topics Concern   Not on file  Social History Narrative   Marital status: divorced; not dating in 2019 but interested slightly.        Children:  3 children; 7 seven grandchildren; 1 gg      Lives: with oldest daughter, 2 grandchildren, 1 gg, 1 friend of daughters, daughter's boyfriend.       Employment: retired [employer] age 63.      Tobacco; 1ppd x 50 years.  Not interested in 2019.      Alcohol: none       Exercise: none in 2019[place]      ADLs:   independent with ADLs; no assistant devices      Advanced Directives:  None; DNR/DNI; no HCPOA.    Social Drivers of Health   Financial Resource Strain: Low Risk  (11/18/2022)   Overall Financial Resource Strain (CARDIA)    Difficulty of Paying Living Expenses: Not very hard  Food Insecurity: No Food Insecurity (11/18/2022)  Hunger Vital Sign    Worried About Running Out of Food in the Last Year: Never true    Ran Out of Food in the Last Year: Never true  Transportation Needs: No Transportation Needs (11/18/2022)   PRAPARE - Administrator, Civil Service (Medical): No    Lack of Transportation (Non-Medical): No  Physical Activity: Unknown (11/18/2022)   Exercise Vital Sign    Days of Exercise per Week: 0 days    Minutes of Exercise per Session: Patient declined  Stress: No Stress Concern Present (11/18/2022)   [employer] of Occupational Health - Occupational Stress Questionnaire    Feeling of Stress : Not at all  Social Connections: Unknown (11/18/2022)   Social Connection and Isolation Panel [NHANES]    Frequency of Communication with Friends and Family: More than three times a week    Frequency of Social Gatherings with Friends and Family: Once a week    Attends Religious Services: Patient declined    Database administrator or Organizations: No    Attends Engineer, structural: Not on file    Marital Status: Divorced  Intimate Partner Violence: Not At Risk (10/17/2022)   Humiliation, Afraid, Rape, and Kick questionnaire    Fear of Current or Ex-Partner: No    Emotionally Abused: No    Physically Abused: No    Sexually Abused: No    Review of Systems  Respiratory:  Positive for shortness of breath.     Vitals:   05/22/23 1032 05/22/23 1034  BP: (!) 155/78 (!) 147/79  Pulse: 94   SpO2: 100%      Physical Exam Constitutional:      Appearance: She is obese.  HENT:     Head:  Atraumatic.     Nose: No congestion or rhinorrhea.     Mouth/Throat:     Pharynx: No oropharyngeal exudate.  Eyes:     General:        Right eye: No discharge.        Left eye: No discharge.  Cardiovascular:     Rate and Rhythm: Normal rate and regular rhythm.  Pulmonary:     Effort: No respiratory distress.     Breath sounds: No stridor. No wheezing or rhonchi.  Musculoskeletal:     Cervical back: No rigidity or tenderness.  Neurological:     Mental Status: She is alert.  Psychiatric:        Mood and Affect: Mood normal.     Data Reviewed: No recent x-ray  Assessment:  Shortness of breath on exertion  Chronic obstructive pulmonary disease  Active smoker  Emphysema  Plan/Recommendations: Continue Trelegy  Albuterol  will be sent in for rescue use  Encouraged to continue to try to cut down smoking, she does not feel she is able to quit and she seems pretty comfortable without much she is smoking now as she has cut down some  I did encourage her to continue to work on quitting as possible  Follow-up in about 6 months  Encouraged to call with significant concerns   Myer Artis MD [facility] 05/22/2023, 10:51 AM  CC: Colleen Dama, NP

## 2023-05-24 LAB — PROTEIN / CREATININE RATIO, URINE
Albumin, U: 202.8
Creatinine, Urine: 60.4

## 2023-05-24 LAB — MICROALBUMIN / CREATININE URINE RATIO: Microalb Creat Ratio: 336

## 2023-05-29 DIAGNOSIS — E785 Hyperlipidemia, unspecified: Secondary | ICD-10-CM | POA: Diagnosis not present

## 2023-05-29 DIAGNOSIS — E1165 Type 2 diabetes mellitus with hyperglycemia: Secondary | ICD-10-CM | POA: Diagnosis not present

## 2023-05-29 DIAGNOSIS — N1831 Chronic kidney disease, stage 3a: Secondary | ICD-10-CM | POA: Diagnosis not present

## 2023-05-29 DIAGNOSIS — I10 Essential (primary) hypertension: Secondary | ICD-10-CM | POA: Diagnosis not present

## 2023-06-05 ENCOUNTER — Encounter: Payer: Self-pay | Admitting: *Deleted

## 2023-06-05 ENCOUNTER — Ambulatory Visit (INDEPENDENT_AMBULATORY_CARE_PROVIDER_SITE_OTHER): Admitting: Family Medicine

## 2023-06-05 VITALS — BP 146/83 | HR 94 | Temp 98.1°F | Resp 16 | Ht 67.0 in | Wt 167.4 lb

## 2023-06-05 DIAGNOSIS — Z7689 Persons encountering health services in other specified circumstances: Secondary | ICD-10-CM

## 2023-06-05 DIAGNOSIS — Z7985 Long-term (current) use of injectable non-insulin antidiabetic drugs: Secondary | ICD-10-CM | POA: Diagnosis not present

## 2023-06-05 DIAGNOSIS — E119 Type 2 diabetes mellitus without complications: Secondary | ICD-10-CM

## 2023-06-05 DIAGNOSIS — I10 Essential (primary) hypertension: Secondary | ICD-10-CM | POA: Diagnosis not present

## 2023-06-05 DIAGNOSIS — Z794 Long term (current) use of insulin: Secondary | ICD-10-CM

## 2023-06-05 DIAGNOSIS — Z0000 Encounter for general adult medical examination without abnormal findings: Secondary | ICD-10-CM

## 2023-06-05 DIAGNOSIS — E7800 Pure hypercholesterolemia, unspecified: Secondary | ICD-10-CM

## 2023-06-05 DIAGNOSIS — Z7984 Long term (current) use of oral hypoglycemic drugs: Secondary | ICD-10-CM

## 2023-06-05 DIAGNOSIS — E1169 Type 2 diabetes mellitus with other specified complication: Secondary | ICD-10-CM

## 2023-06-05 NOTE — Progress Notes (Signed)
Patient is here for their  follow-up Patient has no concerns today Care gaps have been discussed with patient  

## 2023-06-05 NOTE — Progress Notes (Signed)
 Established Patient Office Visit  Subjective    Patient ID: [name], female    DOB: 1945-10-16  Age: 78 y.o. MRN: [id number]  CC:  Chief Complaint  Patient presents with   Medical Management of Chronic Issues    HPI [name] presents for routine follow up of chronic med issues. Patient reports med compliance and denies acute complaints.   Outpatient Encounter Medications as of 06/05/2023  Medication Sig   ACCU-CHEK GUIDE test strip USE AS DIRECTED   Accu-Chek Softclix Lancets lancets USE UP TO 4 TIMES DAILY AS  DIRECTED   acetaminophen  (TYLENOL ) 500 MG tablet Take 500-1,000 mg by mouth daily as needed (pain).   albuterol  (VENTOLIN  HFA) 108 (90 Base) MCG/ACT inhaler Inhale 2 puffs into the lungs every 6 (six) hours as needed for wheezing or shortness of breath.   amLODipine  (NORVASC ) 5 MG tablet TAKE 1 TABLET BY MOUTH DAILY   aspirin  EC 81 MG tablet Take 81 mg by mouth daily. Swallow whole.   blood glucose meter kit and supplies KIT Dispense based on patient and insurance preference. Use up to four times daily as directed. (FOR ICD-9 250.00, 250.01).   blood glucose meter kit and supplies Dispense based on insurance preference. Use up to four times daily as directed. (FOR ICD-10 E11.9 E11.65) AccuCheck Meter       Cholecalciferol (VITAMIN D3) 50 MCG (2000 UT) TABS Take 2,000 Units by mouth in the morning.   empagliflozin (JARDIANCE) 10 MG TABS tablet Take 10 mg by mouth daily.   Fluticasone -Umeclidin-Vilant (TRELEGY ELLIPTA ) 200-62.5-25 MCG/ACT AEPB Inhale 1 puff into the lungs daily.   insulin  glargine (LANTUS  SOLOSTAR) 100 UNIT/ML Solostar Pen Inject 45 Units into the skin at bedtime.   Insulin  Pen Needle (BD PEN NEEDLE NANO U/F) 32G X 4 MM MISC Inject 1 Dose into the skin daily.   lisinopril  (ZESTRIL ) 40 MG tablet TAKE 1 TABLET BY MOUTH DAILY   metoprolol  succinate (TOPROL -XL) 25 MG 24 hr tablet TAKE 1 TABLET BY MOUTH DAILY   pravastatin  (PRAVACHOL ) 80 MG tablet TAKE 1  TABLET BY MOUTH DAILY   pravastatin  (PRAVACHOL ) 80 MG tablet Take 1 tablet (80 mg total) by mouth daily.   metoprolol  succinate (TOPROL -XL) 25 MG 24 hr tablet Take 1 tablet (25 mg total) by mouth daily.   sitaGLIPtin -metformin  (JANUMET ) 50-1000 MG tablet Take 1 tablet by mouth 2 (two) times daily with a meal.   [DISCONTINUED] lisinopril  (ZESTRIL ) 40 MG tablet Take 1 tablet (40 mg total) by mouth daily.   Facility-Administered Encounter Medications as of 06/05/2023  Medication   sodium chloride  flush (NS) 0.9 % injection 3 mL    Past Medical History:  Diagnosis Date   CAD (coronary artery disease), native coronary artery 11/18/2020   Cardiac cath done 11/18/2020 showed 20% mid RCA and RPDA, 35% ostial to proximal LAD, 5% ostial left circumflex and 20% mid LAD   Carotid stenosis    1-39% bilateral stenosis   COPD (chronic obstructive pulmonary disease) (HCC)    Diabetes mellitus    Hyperlipidemia    Hypertension    Night sweats    Rectal polyp     Past Surgical History:  Procedure Laterality Date   ABDOMINAL HYSTERECTOMY  2000   fibroids; ovaries intact.   BACK SURGERY     plate put in back   CHOLECYSTECTOMY N/A 05/01/2017   Procedure: LAPAROSCOPIC CHOLECYSTECTOMY WITH INTRAOPERATIVE CHOLANGIOGRAM;  Surgeon: Dareen Ebbing, MD;  Location: [facility] OR;  Service: General;  Laterality: N/A;   LEFT HEART CATH AND CORONARY ANGIOGRAPHY N/A 11/18/2020   Procedure: LEFT HEART CATH AND CORONARY ANGIOGRAPHY;  Surgeon: Millicent Ally, MD;  Location: [facility] INVASIVE CV LAB;  Service: Cardiovascular;  Laterality: N/A;   RECTAL POLYPECTOMY  05/16/2010   TVAdenoma polyp   TUBAL LIGATION      Family History  Problem Relation Age of Onset   Diabetes Sister    Diabetes Brother    Hypertension Brother    Cancer Mother 58       colon(age 13) and lung (age 17)   Diabetes Mother    Heart disease Mother 61       CABG   Hyperlipidemia Mother    Cancer Father 18       stomach   Diabetes Maternal  Grandmother    Mental illness Sister     Social History   Socioeconomic History   Marital status: Divorced    Spouse name: Not on file   Number of children: 3   Years of education: Not on file   Highest education level: GED or equivalent  Occupational History   Occupation: retired    Comment: [employer]  Tobacco Use   Smoking status: Every Day    Current packs/day: 2.50    Average packs/day: 2.5 packs/day for 60.0 years (150.0 ttl pk-yrs)    Types: Cigarettes    Passive exposure: Current   Smokeless tobacco: Never   Tobacco comments:    1 ppd daily 05/22/23  Vaping Use   Vaping status: Never Used  Substance and Sexual Activity   Alcohol use: No   Drug use: No   Sexual activity: Not on file  Other Topics Concern   Not on file  Social History Narrative   Marital status: divorced; not dating in 2019 but interested slightly.        Children:  3 children; 7 seven grandchildren; 1 gg      Lives: with oldest daughter, 2 grandchildren, 1 gg, 1 friend of daughters, daughter's boyfriend.       Employment: retired [employer] age 89.      Tobacco; 1ppd x 50 years.  Not interested in 2019.      Alcohol: none      Exercise: none in 2019. [place]      ADLs:   independent with ADLs; no assistant devices      Advanced Directives:  None; DNR/DNI; no HCPOA.    Social Drivers of Corporate investment banker Strain: Low Risk  (06/01/2023)   Overall Financial Resource Strain (CARDIA)    Difficulty of Paying Living Expenses: Not hard at all  Food Insecurity: No Food Insecurity (06/01/2023)   Hunger Vital Sign    Worried About Running Out of Food in the Last Year: Never true    Ran Out of Food in the Last Year: Never true  Transportation Needs: No Transportation Needs (06/01/2023)   PRAPARE - Administrator, Civil Service (Medical): No    Lack of Transportation (Non-Medical): No  Physical Activity: Unknown (06/01/2023)   Exercise Vital Sign     Days of Exercise per Week: 0 days    Minutes of Exercise per Session: Patient declined  Stress: No Stress Concern Present (06/01/2023)   [employer] of Occupational Health - Occupational Stress Questionnaire    Feeling of Stress : Not at all  Social Connections: Socially Isolated (06/01/2023)   Social Connection and Isolation Panel [  NHANES]    Frequency of Communication with Friends and Family: More than three times a week    Frequency of Social Gatherings with Friends and Family: Patient declined    Attends Religious Services: Never    Database administrator or Organizations: No    Attends Engineer, structural: Not on file    Marital Status: Divorced  Intimate Partner Violence: Not At Risk (10/17/2022)   Humiliation, Afraid, Rape, and Kick questionnaire    Fear of Current or Ex-Partner: No    Emotionally Abused: No    Physically Abused: No    Sexually Abused: No    Review of Systems  All other systems reviewed and are negative.       Objective    BP (!) 146/83   Pulse 94   Temp 98.1 F (36.7 C) (Oral)   Resp 16   Ht 5\' 7"  (1.702 m)   Wt 167 lb 6.4 oz (75.9 kg)   SpO2 94%   BMI 26.22 kg/m   Physical Exam Vitals and nursing note reviewed.  Constitutional:      General: She is not in acute distress. Cardiovascular:     Rate and Rhythm: Normal rate and regular rhythm.  Pulmonary:     Effort: Pulmonary effort is normal.     Breath sounds: Normal breath sounds.  Abdominal:     Palpations: Abdomen is soft.     Tenderness: There is no abdominal tenderness.  Neurological:     General: No focal deficit present.     Mental Status: She is alert and oriented to person, place, and time.  Psychiatric:        Mood and Affect: Mood normal.        Behavior: Behavior normal.         Assessment & Plan:   Type 2 diabetes mellitus with other specified complication, with long-term current use of insulin  (HCC)  Essential hypertension  Pure  hypercholesterolemia  Encounter for long-term (current) use of insulin  (HCC)  Diabetes mellitus treated with oral medication (HCC)  Long-term current use of injectable noninsulin antidiabetic medication  Encounter to establish care   Appears stable. Continue   Return in about 6 months (around 12/06/2023) for follow up, chronic med issues.   Arlo Lama, MD

## 2023-06-07 ENCOUNTER — Encounter: Payer: Self-pay | Admitting: Family Medicine

## 2023-06-23 ENCOUNTER — Other Ambulatory Visit: Payer: Self-pay | Admitting: Family

## 2023-06-23 DIAGNOSIS — E119 Type 2 diabetes mellitus without complications: Secondary | ICD-10-CM

## 2023-06-26 DIAGNOSIS — E1165 Type 2 diabetes mellitus with hyperglycemia: Secondary | ICD-10-CM | POA: Diagnosis not present

## 2023-06-26 DIAGNOSIS — E785 Hyperlipidemia, unspecified: Secondary | ICD-10-CM | POA: Diagnosis not present

## 2023-06-26 DIAGNOSIS — N1831 Chronic kidney disease, stage 3a: Secondary | ICD-10-CM | POA: Diagnosis not present

## 2023-06-26 DIAGNOSIS — I10 Essential (primary) hypertension: Secondary | ICD-10-CM | POA: Diagnosis not present

## 2023-06-26 DIAGNOSIS — I739 Peripheral vascular disease, unspecified: Secondary | ICD-10-CM | POA: Diagnosis not present

## 2023-08-28 ENCOUNTER — Other Ambulatory Visit: Payer: Self-pay | Admitting: Family

## 2023-08-28 DIAGNOSIS — I10 Essential (primary) hypertension: Secondary | ICD-10-CM

## 2023-08-29 NOTE — Telephone Encounter (Signed)
 Complete

## 2023-09-17 DIAGNOSIS — E1165 Type 2 diabetes mellitus with hyperglycemia: Secondary | ICD-10-CM | POA: Diagnosis not present

## 2023-09-29 ENCOUNTER — Other Ambulatory Visit: Payer: Self-pay | Admitting: Pulmonary Disease

## 2023-10-03 DIAGNOSIS — F17200 Nicotine dependence, unspecified, uncomplicated: Secondary | ICD-10-CM | POA: Diagnosis not present

## 2023-10-03 DIAGNOSIS — E1165 Type 2 diabetes mellitus with hyperglycemia: Secondary | ICD-10-CM | POA: Diagnosis not present

## 2023-10-03 DIAGNOSIS — N1831 Chronic kidney disease, stage 3a: Secondary | ICD-10-CM | POA: Diagnosis not present

## 2023-10-03 DIAGNOSIS — I10 Essential (primary) hypertension: Secondary | ICD-10-CM | POA: Diagnosis not present

## 2023-10-03 DIAGNOSIS — I739 Peripheral vascular disease, unspecified: Secondary | ICD-10-CM | POA: Diagnosis not present

## 2023-10-03 DIAGNOSIS — Z23 Encounter for immunization: Secondary | ICD-10-CM | POA: Diagnosis not present

## 2023-10-03 DIAGNOSIS — E785 Hyperlipidemia, unspecified: Secondary | ICD-10-CM | POA: Diagnosis not present

## 2023-11-06 ENCOUNTER — Ambulatory Visit (HOSPITAL_COMMUNITY)
Admission: RE | Admit: 2023-11-06 | Discharge: 2023-11-06 | Disposition: A | Discharge status: Home / Self Care | Source: Ambulatory Visit | Attending: Cardiology | Admitting: Cardiology

## 2023-11-06 DIAGNOSIS — I6523 Occlusion and stenosis of bilateral carotid arteries: Secondary | ICD-10-CM | POA: Diagnosis not present

## 2023-11-09 ENCOUNTER — Encounter: Payer: Self-pay | Admitting: Cardiology

## 2023-11-09 ENCOUNTER — Ambulatory Visit: Payer: Self-pay | Admitting: Cardiology

## 2023-11-11 ENCOUNTER — Other Ambulatory Visit: Payer: Self-pay

## 2023-11-11 ENCOUNTER — Ambulatory Visit (INDEPENDENT_AMBULATORY_CARE_PROVIDER_SITE_OTHER)

## 2023-11-11 VITALS — Ht 67.0 in | Wt 167.0 lb

## 2023-11-11 DIAGNOSIS — Z0000 Encounter for general adult medical examination without abnormal findings: Secondary | ICD-10-CM

## 2023-11-11 DIAGNOSIS — I6523 Occlusion and stenosis of bilateral carotid arteries: Secondary | ICD-10-CM

## 2023-11-11 NOTE — Progress Notes (Signed)
 Subjective:   [name] is a 78 y.o. who presents for a Medicare Wellness preventive visit.  As a reminder, Annual Wellness Visits don't include a physical exam, and some assessments may be limited, especially if this visit is performed virtually. We may recommend an in-person follow-up visit with your provider if needed.  Visit Complete: Virtual I connected with  [name] on 11/11/23 by a video and audio enabled telemedicine application and verified that I am speaking with the correct person using two identifiers.  Patient Location: Home  Provider Location: Home Office  I discussed the limitations of evaluation and management by telemedicine. The patient expressed understanding and agreed to proceed.  Vital Signs: Because this visit was a virtual/telehealth visit, some criteria may be missing or patient reported. Any vitals not documented were not able to be obtained and vitals that have been documented are patient reported.  Persons Participating in Visit: Patient.  AWV Questionnaire: Yes: Patient Medicare AWV questionnaire was completed by the patient on 11/07/2023; I have confirmed that all information answered by patient is correct and no changes since this date.  Cardiac Risk Factors include: advanced age (>38men, >49 women);hypertension;diabetes mellitus;Other (see comment), Risk factor comments: COPD     Objective:    Today's Vitals   11/11/23 1356  Weight: 167 lb (75.8 kg)  Height: 5' 7 (1.702 m)   Body mass index is 26.16 kg/m.     11/11/2023    2:06 PM 10/17/2022    8:18 AM 11/18/2020    8:28 AM 08/01/2020    8:54 AM 07/24/2017   10:02 AM 04/30/2017    2:06 PM 06/06/2016    9:13 AM  Advanced Directives  Does Patient Have a Medical Advance Directive? Yes Yes No No No  No  No   Type of Estate Agent of [place];Living will Healthcare Power of [place];Living will       Does patient want to make changes to medical advance directive? No -  Patient declined        Copy of Healthcare Power of Attorney in Chart? Yes - validated most recent copy scanned in chart (See row information) Yes - validated most recent copy scanned in chart (See row information)       Would patient like information on creating a medical advance directive?   No - Patient declined Yes (Inpatient - patient defers creating a medical advance directive at this time - Information given) No - Patient declined  No - Patient declined  Yes (ED - Information included in AVS)      Data saved with a previous flowsheet row definition    Current Medications (verified) Outpatient Encounter Medications as of 11/11/2023  Medication Sig   ACCU-CHEK GUIDE TEST test strip USE AS DIRECTED   Accu-Chek Softclix Lancets lancets USE UP TO 4 TIMES DAILY AS  DIRECTED   acetaminophen  (TYLENOL ) 500 MG tablet Take 500-1,000 mg by mouth daily as needed (pain).   albuterol  (VENTOLIN  HFA) 108 (90 Base) MCG/ACT inhaler Inhale 2 puffs into the lungs every 6 (six) hours as needed for wheezing or shortness of breath.   amLODipine  (NORVASC ) 5 MG tablet TAKE 1 TABLET BY MOUTH DAILY   aspirin  EC 81 MG tablet Take 81 mg by mouth daily. Swallow whole.   blood glucose meter kit and supplies KIT Dispense based on patient and insurance preference. Use up to four times daily as directed. (FOR ICD-9 250.00, 250.01).   blood glucose meter kit and  supplies Dispense based on insurance preference. Use up to four times daily as directed. (FOR ICD-10 E11.9 E11.65) AccuCheck Meter       Cholecalciferol (VITAMIN D3) 50 MCG (2000 UT) TABS Take 2,000 Units by mouth in the morning.   Fluticasone -Umeclidin-Vilant (TRELEGY ELLIPTA ) 200-62.5-25 MCG/ACT AEPB USE 1 INHALATION BY MOUTH DAILY   insulin  glargine (LANTUS  SOLOSTAR) 100 UNIT/ML Solostar Pen Inject 45 Units into the skin at bedtime.   Insulin  Pen Needle (BD PEN NEEDLE NANO U/F) 32G X 4 MM MISC Inject 1 Dose into the skin daily.   lisinopril  (ZESTRIL ) 40 MG  tablet TAKE 1 TABLET BY MOUTH DAILY   metoprolol  succinate (TOPROL -XL) 25 MG 24 hr tablet TAKE 1 TABLET BY MOUTH DAILY   pravastatin  (PRAVACHOL ) 80 MG tablet TAKE 1 TABLET BY MOUTH DAILY   pravastatin  (PRAVACHOL ) 80 MG tablet Take 1 tablet (80 mg total) by mouth daily.   Semaglutide,0.25 or 0.5MG /DOS, (OZEMPIC, 0.25 OR 0.5 MG/DOSE,) 2 MG/1.5ML SOPN Inject 0.5 mg into the skin once a week.   [DISCONTINUED] empagliflozin (JARDIANCE) 10 MG TABS tablet Take 10 mg by mouth daily.   JARDIANCE 25 MG TABS tablet Take 25 mg by mouth daily.   Facility-Administered Encounter Medications as of 11/11/2023  Medication   sodium chloride  flush (NS) 0.9 % injection 3 mL    Allergies (verified) Patient has no known allergies.   History: Past Medical History:  Diagnosis Date   CAD (coronary artery disease), native coronary artery 11/18/2020   Cardiac cath done 11/18/2020 showed 20% mid RCA and RPDA, 35% ostial to proximal LAD, 5% ostial left circumflex and 20% mid LAD   Carotid stenosis    1-39% bilateral stenosis by Dopplers 10/2023   COPD (chronic obstructive pulmonary disease) (HCC)    Diabetes mellitus    Hyperlipidemia    Hypertension    Night sweats    Rectal polyp    Past Surgical History:  Procedure Laterality Date   ABDOMINAL HYSTERECTOMY  2000   fibroids; ovaries intact.   BACK SURGERY     plate put in back   CHOLECYSTECTOMY N/A 05/01/2017   Procedure: LAPAROSCOPIC CHOLECYSTECTOMY WITH INTRAOPERATIVE CHOLANGIOGRAM;  Surgeon: Belinda Cough, MD;  Location: [facility] OR;  Service: General;  Laterality: N/A;   LEFT HEART CATH AND CORONARY ANGIOGRAPHY N/A 11/18/2020   Procedure: LEFT HEART CATH AND CORONARY ANGIOGRAPHY;  Surgeon: Burnard Debby LABOR, MD;  Location: [facility] INVASIVE CV LAB;  Service: Cardiovascular;  Laterality: N/A;   RECTAL POLYPECTOMY  05/16/2010   TVAdenoma polyp   TUBAL LIGATION     Family History  Problem Relation Age of Onset   Diabetes Sister    Diabetes Brother    Hypertension  Brother    Cancer Mother 83       colon(age 66) and lung (age 66)   Diabetes Mother    Heart disease Mother 44       CABG   Hyperlipidemia Mother    Cancer Father 19       stomach   Diabetes Maternal Grandmother    Mental illness Sister    Social History   Socioeconomic History   Marital status: Divorced    Spouse name: Not on file   Number of children: 3   Years of education: Not on file   Highest education level: GED or equivalent  Occupational History   Occupation: retired    Comment: [employer]  Tobacco Use   Smoking status: Every Day    Current packs/day: 2.50  Average packs/day: 2.5 packs/day for 60.0 years (150.0 ttl pk-yrs)    Types: Cigarettes    Passive exposure: Current   Smokeless tobacco: Never   Tobacco comments:    1 ppd daily 05/22/23  Vaping Use   Vaping status: Never Used  Substance and Sexual Activity   Alcohol use: No   Drug use: No   Sexual activity: Not on file  Other Topics Concern   Not on file  Social History Narrative   Marital status: divorced; not dating in 2019 but interested slightly.        Children:  3 children; 7 seven grandchildren; 1 gg      Lives: with oldest daughter, 2 grandchildren, 1 gg, 1 friend of daughters, daughter's boyfriend.       Employment: retired [employer] age 19.      Tobacco; 1ppd x 50 years.  Not interested in 2019.      Alcohol: none      Exercise: none in 2019. [place]      ADLs:   independent with ADLs; no assistant devices      Advanced Directives:  None; DNR/DNI; no HCPOA.       Lives with daughter and her family/2025   Social Drivers of Health   Financial Resource Strain: Low Risk  (11/07/2023)   Overall Financial Resource Strain (CARDIA)    Difficulty of Paying Living Expenses: Not hard at all  Food Insecurity: Unknown (11/07/2023)   Hunger Vital Sign    Worried About Running Out of Food in the Last Year: Patient declined    Ran Out of Food in the Last Year:  Never true  Transportation Needs: No Transportation Needs (11/07/2023)   PRAPARE - Administrator, Civil Service (Medical): No    Lack of Transportation (Non-Medical): No  Physical Activity: Inactive (11/07/2023)   Exercise Vital Sign    Days of Exercise per Week: 0 days    Minutes of Exercise per Session: Not on file  Stress: No Stress Concern Present (11/07/2023)   [employer] of Occupational Health - Occupational Stress Questionnaire    Feeling of Stress: Not at all  Social Connections: Socially Isolated (11/07/2023)   Social Connection and Isolation Panel    Frequency of Communication with Friends and Family: More than three times a week    Frequency of Social Gatherings with Friends and Family: Once a week    Attends Religious Services: Patient declined    Database Administrator or Organizations: No    Attends Engineer, Structural: Not on file    Marital Status: Divorced    Tobacco Counseling Ready to quit: Not Answered Counseling given: Not Answered Tobacco comments: 1 ppd daily 05/22/23    Clinical Intake:  Pre-visit preparation completed: Yes  Pain : No/denies pain     BMI - recorded: 26.16 Nutritional Status: BMI 25 -29 Overweight Nutritional Risks: None Diabetes: Yes CBG done?: Yes (fasting 105-per pt) CBG resulted in Enter/ Edit results?: No Did pt. bring in CBG monitor from home?: No  Lab Results  Component Value Date   HGBA1C 7.6 04/23/2023   HGBA1C 8.8 (H) 05/17/2022   HGBA1C 7.7 (A) 02/09/2022     How often do you need to have someone help you when you read instructions, pamphlets, or other written materials from your doctor or pharmacy?: 1 - Never  Interpreter Needed?: No  Information entered by :: Rainier Feuerborn, RMA   Activities of Daily  Living     11/07/2023   10:37 AM  In your present state of health, do you have any difficulty performing the following activities:  Hearing? 0  Vision? 0  Difficulty  concentrating or making decisions? 0  Walking or climbing stairs? 0  Dressing or bathing? 0  Doing errands, shopping? 0  Preparing Food and eating ? N  Using the Toilet? N  In the past six months, have you accidently leaked urine? N  Do you have problems with loss of bowel control? N  Managing your Medications? N  Managing your Finances? N  Housekeeping or managing your Housekeeping? N    Patient Care Team: Lorren Greig PARAS, NP as PCP - General (Nurse Practitioner) Shlomo Wilbert SAUNDERS, MD as PCP - Cardiology (Cardiology) [facility] Gladis Kristeen PARAS, CMA as Certified Medical Assistant  I have updated your Care Teams any recent Medical Services you may have received from other providers in the past year.     Assessment:   This is a routine wellness examination for [facility] [address]  Hearing/Vision screen Hearing Screening - Comments:: Denies hearing difficulties   Vision Screening - Comments:: Wears eyeglasses/[facility]   Goals Addressed             This Visit's Progress    Patient Stated       10/17/2022, wants to quit smoking and lose weight Still working on this goal/2025       Depression Screen     11/11/2023    2:10 PM 10/17/2022    8:19 AM 05/30/2022   10:47 AM 05/17/2022   10:22 AM 12/01/2021   10:51 AM 10/27/2021    9:50 AM 09/28/2021   10:57 AM  PHQ 2/9 Scores  PHQ - 2 Score 0 0 0 0 0 0 0  PHQ- 9 Score 0 0 0    0    Fall Risk     11/07/2023   10:37 AM 10/13/2022   11:12 AM 05/17/2022   10:22 AM 12/01/2021   10:52 AM 11/30/2021   11:37 AM  Fall Risk   Falls in the past year? 0 0 0 1 0  Number falls in past yr: 0 0 0 0 0  Injury with Fall? 0 0 0 0 0  Risk for fall due to :  Medication side effect No Fall Risks No Fall Risks   Follow up Falls evaluation completed;Falls prevention discussed Falls prevention discussed;Falls evaluation completed  Falls evaluation completed       Data saved with a previous flowsheet row definition     MEDICARE RISK AT HOME:  Medicare Risk at Home Any stairs in or around the home?: (Patient-Rptd) Yes If so, are there any without handrails?: (Patient-Rptd) No Home free of loose throw rugs in walkways, pet beds, electrical cords, etc?: (Patient-Rptd) No Adequate lighting in your home to reduce risk of falls?: (Patient-Rptd) Yes Life alert?: (Patient-Rptd) No Use of a cane, walker or w/c?: (Patient-Rptd) No Grab bars in the bathroom?: (Patient-Rptd) Yes Shower chair or bench in shower?: (Patient-Rptd) Yes Elevated toilet seat or a handicapped toilet?: (Patient-Rptd) No  TIMED UP AND GO:  Was the test performed?  No  Cognitive Function: 6CIT completed    08/01/2020    8:55 AM  MMSE - Mini Mental State Exam  Orientation to time 5  Orientation to Place 5  Registration 3  Attention/ Calculation 5  Recall 3  Language- name 2 objects 2  Language- repeat 1  Language- follow 3 step command 3  Language- read & follow direction 1  Write a sentence 1  Copy design 1  Total score 30        11/11/2023    2:07 PM 10/17/2022    8:20 AM 12/01/2021   10:54 AM  6CIT Screen  What Year? 0 points 0 points 0 points  What month? 0 points 0 points 0 points  What time? 0 points 0 points 0 points  Count back from 20 0 points 0 points 0 points  Months in reverse 0 points 0 points 0 points  Repeat phrase 0 points 0 points 0 points  Total Score 0 points 0 points 0 points    Immunizations Immunization History  Administered Date(s) Administered   Fluad Quad(high Dose 65+) 10/14/2018, 09/28/2022   Influenza Split 09/28/2011, 09/15/2012, 09/23/2013   Influenza,inj,Quad PF,6+ Mos 09/21/2014, 09/11/2016, 09/28/2021   Influenza-Unspecified 09/29/2015, 10/16/2019   PFIZER(Purple Top)SARS-COV-2 Vaccination 03/12/2019, 04/01/2019, 10/20/2019, 05/27/2020   Pneumococcal Conjugate-13 02/07/2015   Pneumococcal Polysaccharide-23 12/20/2011   Tdap 09/21/2014   Zoster, Live 04/17/2012     Screening Tests Health Maintenance  Topic Date Due   Zoster Vaccines- Shingrix (1 of 2) 11/01/1994   FOOT EXAM  10/14/2019   Lung Cancer Screening  10/25/2021   OPHTHALMOLOGY EXAM  07/01/2022   Diabetic kidney evaluation - eGFR measurement  02/03/2023   HEMOGLOBIN A1C  10/23/2023   COVID-19 Vaccine (6 - 2025-26 season) 04/01/2024   Diabetic kidney evaluation - Urine ACR  05/23/2024   DTaP/Tdap/Td (2 - Td or Tdap) 09/20/2024   Medicare Annual Wellness (AWV)  11/10/2024   Pneumococcal Vaccine: 50+ Years  Completed   Influenza Vaccine  Completed   DEXA SCAN  Completed   Hepatitis C Screening  Completed   Meningococcal B Vaccine  Aged Out   Mammogram  Discontinued   Colonoscopy  Discontinued    Health Maintenance Items Addressed: Diabetic Foot Exam recommended, Labs Due eGFR, A1c, See Nurse Notes at the end of this note  Additional Screening:  Vision Screening: Recommended annual ophthalmology exams for early detection of glaucoma and other disorders of the eye. Is the patient up to date with their annual eye exam?  Yes  Who is the provider or what is the name of the office in which the patient attends annual eye exams? [facility]/Per pt-up to date  Dental Screening: Recommended annual dental exams for proper oral hygiene  Community Resource Referral / Chronic Care Management: CRR required this visit?  No   CCM required this visit?  No   Plan:    I have personally reviewed and noted the following in the patient's chart:   Medical and social history Use of alcohol, tobacco or illicit drugs  Current medications and supplements including opioid prescriptions. Patient is not currently taking opioid prescriptions. Functional ability and status Nutritional status Physical activity Advanced directives List of other physicians Hospitalizations, surgeries, and ER visits in previous 12 months Vitals Screenings to include cognitive, depression, and falls Referrals and  appointments  In addition, I have reviewed and discussed with patient certain preventive protocols, quality metrics, and best practice recommendations. A written personalized care plan for preventive services as well as general preventive health recommendations were provided to patient.   Samyra Limb L Jaylyn Booher, CMA   11/11/2023   After Visit Summary: (MyChart) Due to this being a telephonic visit, the after visit summary with patients personalized plan was offered to patient via MyChart   Notes: Patient stated that  she has had all her lab work done in September with Roy Raisin, [place].  Patient stated that she would like to only see one provider and she would like it to be Dr. Tanda.  I informed patient to express that during her next office visit here.  Patient is due for a foot exam, an A1c check, a eGFR, which all can be done here during her next visit.  Patient is also eligible for a lung cancer screening.  Patient stated that she has had a eye exam in August with [facility].  I have sent a request of records out today.

## 2023-11-11 NOTE — Patient Instructions (Signed)
 [name],  Thank you for taking the time for your Medicare Wellness Visit. I appreciate your continued commitment to your health goals. Please review the care plan we discussed, and feel free to reach out if I can assist you further.  Medicare recommends these wellness visits once per year to help you and your care team stay ahead of potential health issues. These visits are designed to focus on prevention, allowing your provider to concentrate on managing your acute and chronic conditions during your regular appointments.  Please note that Annual Wellness Visits do not include a physical exam. Some assessments may be limited, especially if the visit was conducted virtually. If needed, we may recommend a separate in-person follow-up with your provider.  Ongoing Care Seeing your primary care provider every 3 to 6 months helps us  monitor your health and provide consistent, personalized care. Next office visit on 12/06/2023.  You are due for a foot exam, an A1C check and a kidney evaluation, which will be done during your next office visit.    Referrals If a referral was made during today's visit and you haven't received any updates within two weeks, please contact the referred provider directly to check on the status.  Recommended Screenings:  Health Maintenance  Topic Date Due   Zoster (Shingles) Vaccine (1 of 2) 11/01/1994   Complete foot exam   10/14/2019   Screening for Lung Cancer  10/25/2021   Eye exam for diabetics  07/01/2022   Yearly kidney function blood test for diabetes  02/03/2023   Hemoglobin A1C  10/23/2023   COVID-19 Vaccine (6 - 2025-26 season) 04/01/2024   Yearly kidney health urinalysis for diabetes  05/23/2024   DTaP/Tdap/Td vaccine (2 - Td or Tdap) 09/20/2024   Medicare Annual Wellness Visit  11/10/2024   Pneumococcal Vaccine for age over 43  Completed   Flu Shot  Completed   DEXA scan (bone density measurement)  Completed   Hepatitis C Screening  Completed    Meningitis B Vaccine  Aged Out   Breast Cancer Screening  Discontinued   Colon Cancer Screening  Discontinued       11/11/2023    2:06 PM  Advanced Directives  Does Patient Have a Medical Advance Directive? Yes  Type of Estate Agent of [place];Living will  Does patient want to make changes to medical advance directive? No - Patient declined  Copy of Healthcare Power of Attorney in Chart? Yes - validated most recent copy scanned in chart (See row information)   Advance Care Planning is important because it: Ensures you receive medical care that aligns with your values, goals, and preferences. Provides guidance to your family and loved ones, reducing the emotional burden of decision-making during critical moments.  Vision: Annual vision screenings are recommended for early detection of glaucoma, cataracts, and diabetic retinopathy. These exams can also reveal signs of chronic conditions such as diabetes and high blood pressure.  Dental: Annual dental screenings help detect early signs of oral cancer, gum disease, and other conditions linked to overall health, including heart disease and diabetes.  Please see the attached documents for additional preventive care recommendations.

## 2023-12-02 ENCOUNTER — Other Ambulatory Visit: Payer: Self-pay | Admitting: Family

## 2023-12-02 DIAGNOSIS — I10 Essential (primary) hypertension: Secondary | ICD-10-CM

## 2023-12-06 ENCOUNTER — Ambulatory Visit: Admitting: Family Medicine

## 2023-12-06 ENCOUNTER — Encounter: Admitting: Family Medicine

## 2023-12-06 DIAGNOSIS — N760 Acute vaginitis: Secondary | ICD-10-CM | POA: Diagnosis not present

## 2023-12-06 DIAGNOSIS — E7800 Pure hypercholesterolemia, unspecified: Secondary | ICD-10-CM

## 2023-12-06 DIAGNOSIS — I10 Essential (primary) hypertension: Secondary | ICD-10-CM

## 2023-12-06 DIAGNOSIS — F17200 Nicotine dependence, unspecified, uncomplicated: Secondary | ICD-10-CM

## 2023-12-06 DIAGNOSIS — E119 Type 2 diabetes mellitus without complications: Secondary | ICD-10-CM | POA: Diagnosis not present

## 2023-12-06 MED ORDER — METRONIDAZOLE 500 MG PO TABS: 500.0000 mg | ORAL_TABLET | Freq: Two times a day (BID) | ORAL | 0 refills | Status: AC

## 2023-12-06 NOTE — Progress Notes (Signed)
 It was reported to from Purvis Pepper, CMA that patient prefers to be seen by Raguel Blush, MD who also agreed to see patient on today.

## 2023-12-06 NOTE — Progress Notes (Unsigned)
 Established Patient Office Visit  Subjective    Patient ID: [name], female    DOB: Jul 31, 1945  Age: 78 y.o. MRN: [id number]  CC: No chief complaint on file.   HPI [name] presents for routine follow up of chronic med issues including diabetes and hypertension. Patient reports med compliance. Patient also reports some incresed vaginal discharge and believes she has a bacterial infection.   Outpatient Encounter Medications as of 12/06/2023  Medication Sig   metroNIDAZOLE  (FLAGYL ) 500 MG tablet Take 1 tablet (500 mg total) by mouth 2 (two) times daily for 7 days.   ACCU-CHEK GUIDE TEST test strip USE AS DIRECTED   Accu-Chek Softclix Lancets lancets USE UP TO 4 TIMES DAILY AS  DIRECTED   acetaminophen  (TYLENOL ) 500 MG tablet Take 500-1,000 mg by mouth daily as needed (pain).   albuterol  (VENTOLIN  HFA) 108 (90 Base) MCG/ACT inhaler Inhale 2 puffs into the lungs every 6 (six) hours as needed for wheezing or shortness of breath.   amLODipine  (NORVASC ) 5 MG tablet TAKE 1 TABLET BY MOUTH DAILY   aspirin  EC 81 MG tablet Take 81 mg by mouth daily. Swallow whole.   blood glucose meter kit and supplies KIT Dispense based on patient and insurance preference. Use up to four times daily as directed. (FOR ICD-9 250.00, 250.01).   blood glucose meter kit and supplies Dispense based on insurance preference. Use up to four times daily as directed. (FOR ICD-10 E11.9 E11.65) AccuCheck Meter       Cholecalciferol (VITAMIN D3) 50 MCG (2000 UT) TABS Take 2,000 Units by mouth in the morning.   Fluticasone -Umeclidin-Vilant (TRELEGY ELLIPTA ) 200-62.5-25 MCG/ACT AEPB USE 1 INHALATION BY MOUTH DAILY   insulin  glargine (LANTUS  SOLOSTAR) 100 UNIT/ML Solostar Pen Inject 45 Units into the skin at bedtime.   Insulin  Pen Needle (BD PEN NEEDLE NANO U/F) 32G X 4 MM MISC Inject 1 Dose into the skin daily.   JARDIANCE 25 MG TABS tablet Take 25 mg by mouth daily.   lisinopril  (ZESTRIL ) 40 MG tablet TAKE 1 TABLET  BY MOUTH DAILY   metoprolol  succinate (TOPROL -XL) 25 MG 24 hr tablet TAKE 1 TABLET BY MOUTH DAILY   pravastatin  (PRAVACHOL ) 80 MG tablet TAKE 1 TABLET BY MOUTH DAILY   pravastatin  (PRAVACHOL ) 80 MG tablet Take 1 tablet (80 mg total) by mouth daily.   Semaglutide,0.25 or 0.5MG /DOS, (OZEMPIC, 0.25 OR 0.5 MG/DOSE,) 2 MG/1.5ML SOPN Inject 0.5 mg into the skin once a week.   Facility-Administered Encounter Medications as of 12/06/2023  Medication   sodium chloride  flush (NS) 0.9 % injection 3 mL    Past Medical History:  Diagnosis Date   CAD (coronary artery disease), native coronary artery 11/18/2020   Cardiac cath done 11/18/2020 showed 20% mid RCA and RPDA, 35% ostial to proximal LAD, 5% ostial left circumflex and 20% mid LAD   Carotid stenosis    1-39% bilateral stenosis by Dopplers 10/2023   COPD (chronic obstructive pulmonary disease) (HCC)    Diabetes mellitus    Hyperlipidemia    Hypertension    Night sweats    Rectal polyp     Past Surgical History:  Procedure Laterality Date   ABDOMINAL HYSTERECTOMY  2000   fibroids; ovaries intact.   BACK SURGERY     plate put in back   CHOLECYSTECTOMY N/A 05/01/2017   Procedure: LAPAROSCOPIC CHOLECYSTECTOMY WITH INTRAOPERATIVE CHOLANGIOGRAM;  Surgeon: Belinda Cough, MD;  Location: [facility] OR;  Service: General;  Laterality: N/A;   LEFT  HEART CATH AND CORONARY ANGIOGRAPHY N/A 11/18/2020   Procedure: LEFT HEART CATH AND CORONARY ANGIOGRAPHY;  Surgeon: Burnard Debby LABOR, MD;  Location: [facility];  Service: Cardiovascular;  Laterality: N/A;   RECTAL POLYPECTOMY  05/16/2010   TVAdenoma polyp   TUBAL LIGATION      Family History  Problem Relation Age of Onset   Diabetes Sister    Diabetes Brother    Hypertension Brother    Cancer Mother 20       colon(age 66) and lung (age 57)   Diabetes Mother    Heart disease Mother 90       CABG   Hyperlipidemia Mother    Cancer Father 16       stomach   Diabetes Maternal Grandmother    Mental  illness Sister     Social History   Socioeconomic History   Marital status: Divorced    Spouse name: Not on file   Number of children: 3   Years of education: Not on file   Highest education level: GED or equivalent  Occupational History   Occupation: retired    Comment: [employer]  Tobacco Use   Smoking status: Every Day    Current packs/day: 2.50    Average packs/day: 2.5 packs/day for 60.0 years (150.0 ttl pk-yrs)    Types: Cigarettes    Passive exposure: Current   Smokeless tobacco: Never   Tobacco comments:    1 ppd daily 05/22/23  Vaping Use   Vaping status: Never Used  Substance and Sexual Activity   Alcohol use: No   Drug use: No   Sexual activity: Not on file  Other Topics Concern   Not on file  Social History Narrative   Marital status: divorced; not dating in 2019 but interested slightly.        Children:  3 children; 7 seven grandchildren; 1 gg      Lives: with oldest daughter, 2 grandchildren, 1 gg, 1 friend of daughters, daughter's boyfriend.       Employment: retired [employer] age 56.      Tobacco; 1ppd x 50 years.  Not interested in 2019.      Alcohol: none      Exercise: none in 2019. [place]      ADLs:   independent with ADLs; no assistant devices      Advanced Directives:  None; DNR/DNI; no HCPOA.       Lives with daughter and her family/2025   Social Drivers of Health   Financial Resource Strain: Low Risk  (11/07/2023)   Overall Financial Resource Strain (CARDIA)    Difficulty of Paying Living Expenses: Not hard at all  Food Insecurity: Unknown (11/07/2023)   Hunger Vital Sign    Worried About Running Out of Food in the Last Year: Patient declined    Ran Out of Food in the Last Year: Never true  Transportation Needs: No Transportation Needs (11/07/2023)   PRAPARE - Administrator, Civil Service (Medical): No    Lack of Transportation (Non-Medical): No  Physical Activity: Inactive (11/07/2023)    Exercise Vital Sign    Days of Exercise per Week: 0 days    Minutes of Exercise per Session: Not on file  Stress: No Stress Concern Present (11/07/2023)   [employer] of Occupational Health - Occupational Stress Questionnaire    Feeling of Stress: Not at all  Social Connections: Socially Isolated (11/07/2023)   Social  Connection and Isolation Panel    Frequency of Communication with Friends and Family: More than three times a week    Frequency of Social Gatherings with Friends and Family: Once a week    Attends Religious Services: Patient declined    Database Administrator or Organizations: No    Attends Engineer, Structural: Not on file    Marital Status: Divorced  Intimate Partner Violence: Patient Unable To Answer (11/11/2023)   Humiliation, Afraid, Rape, and Kick questionnaire    Fear of Current or Ex-Partner: Patient unable to answer    Emotionally Abused: Patient unable to answer    Physically Abused: Patient unable to answer    Sexually Abused: Patient unable to answer    Review of Systems  All other systems reviewed and are negative.       Objective    There were no vitals taken for this visit.  Physical Exam Vitals and nursing note reviewed.  Constitutional:      General: She is not in acute distress. Cardiovascular:     Rate and Rhythm: Normal rate and regular rhythm.  Pulmonary:     Effort: Pulmonary effort is normal.     Breath sounds: Normal breath sounds.  Abdominal:     Palpations: Abdomen is soft.     Tenderness: There is no abdominal tenderness.  Neurological:     General: No focal deficit present.     Mental Status: She is alert and oriented to person, place, and time.  Psychiatric:        Mood and Affect: Mood normal.        Behavior: Behavior normal.         Assessment & Plan:   1. Type 2 diabetes mellitus without complication, without long-term current use of insulin  (HCC) (Primary) Continue   2. Essential  hypertension Continue   3. Pure hypercholesterolemia Continue   4. Vaginitis and vulvovaginitis Flagyl  prescribed.   5. Smoker Discussed cessation/reduction    Return in about 3 months (around 03/07/2024) for follow up.   Tanda Raguel SQUIBB, MD

## 2023-12-10 ENCOUNTER — Encounter: Payer: Self-pay | Admitting: Family Medicine

## 2023-12-31 ENCOUNTER — Other Ambulatory Visit: Payer: Self-pay | Admitting: Family

## 2023-12-31 DIAGNOSIS — I10 Essential (primary) hypertension: Secondary | ICD-10-CM

## 2023-12-31 DIAGNOSIS — I2510 Atherosclerotic heart disease of native coronary artery without angina pectoris: Secondary | ICD-10-CM

## 2023-12-31 DIAGNOSIS — E7800 Pure hypercholesterolemia, unspecified: Secondary | ICD-10-CM

## 2024-01-27 ENCOUNTER — Ambulatory Visit: Admitting: Pulmonary Disease

## 2024-01-27 ENCOUNTER — Encounter: Payer: Self-pay | Admitting: Pulmonary Disease

## 2024-01-27 VITALS — BP 118/64 | HR 98 | Temp 97.8°F | Ht 67.0 in | Wt 153.0 lb

## 2024-01-27 DIAGNOSIS — J449 Chronic obstructive pulmonary disease, unspecified: Secondary | ICD-10-CM | POA: Diagnosis not present

## 2024-01-27 DIAGNOSIS — J439 Emphysema, unspecified: Secondary | ICD-10-CM

## 2024-01-27 DIAGNOSIS — F17210 Nicotine dependence, cigarettes, uncomplicated: Secondary | ICD-10-CM | POA: Diagnosis not present

## 2024-01-27 MED ORDER — TRELEGY ELLIPTA 200-62.5-25 MCG/ACT IN AEPB: 1.0000 | INHALATION_SPRAY | Freq: Every day | RESPIRATORY_TRACT | 2 refills | Status: AC

## 2024-01-27 NOTE — Patient Instructions (Signed)
 I will see you back in about 6 months  Continue Trelegy  Graded exercises as tolerated  Continue to cut down on your smoking  Call us  with significant concerns

## 2024-01-27 NOTE — Progress Notes (Signed)
 "              [name]    [id number]    Feb 21, 1945  Primary Care Physician:Wilson, Raguel, MD  Referring Physician: Jaycee Greig PARAS, NP [address] [place],  [place] [address]  Chief complaint:   Follow-up for obstructive lung disease  Discussed the use of AI scribe software for clinical note transcription with the patient, who gave verbal consent to proceed.  History of Present Illness [name] is a 79 year old female who presents with breathing difficulties.  She experiences shortness of breath with activities, although her breathing has remained stable. She can perform daily activities independently but occasionally needs to take breaks due to breathlessness. She reports that she would be 'huffing and puffing' after walking short distances and needs to stop and take a break.  She has reduced her smoking from a pack a day to about twelve cigarettes a day and is making efforts to cut back further.  No recent episodes of bronchitis in the past two to three months. She is currently using Trelegy and reports no issues with this medication.  She tries to stay active, does not do routine exercises but does try to stay active Gets short of breath with mild to moderate exertion  No chest pains or chest discomfort Has not had any significant exacerbations  Outpatient Encounter Medications as of 01/27/2024  Medication Sig   ACCU-CHEK GUIDE TEST test strip USE AS DIRECTED   Accu-Chek Softclix Lancets lancets USE UP TO 4 TIMES DAILY AS  DIRECTED   acetaminophen  (TYLENOL ) 500 MG tablet Take 500-1,000 mg by mouth daily as needed (pain).   albuterol  (VENTOLIN  HFA) 108 (90 Base) MCG/ACT inhaler Inhale 2 puffs into the lungs every 6 (six) hours as needed for wheezing or shortness of breath.   amLODipine  (NORVASC ) 5 MG tablet TAKE 1 TABLET BY MOUTH DAILY   aspirin  EC 81 MG tablet Take 81 mg by mouth daily. Swallow whole.   blood glucose meter kit and supplies KIT Dispense based  on patient and insurance preference. Use up to four times daily as directed. (FOR ICD-9 250.00, 250.01).   blood glucose meter kit and supplies Dispense based on insurance preference. Use up to four times daily as directed. (FOR ICD-10 E11.9 E11.65) AccuCheck Meter       Cholecalciferol (VITAMIN D3) 50 MCG (2000 UT) TABS Take 2,000 Units by mouth in the morning.   insulin  glargine (LANTUS  SOLOSTAR) 100 UNIT/ML Solostar Pen Inject 45 Units into the skin at bedtime.   Insulin  Pen Needle (BD PEN NEEDLE NANO U/F) 32G X 4 MM MISC Inject 1 Dose into the skin daily.   JARDIANCE 25 MG TABS tablet Take 25 mg by mouth daily.   lisinopril  (ZESTRIL ) 40 MG tablet TAKE 1 TABLET BY MOUTH DAILY   metoprolol  succinate (TOPROL -XL) 25 MG 24 hr tablet TAKE 1 TABLET BY MOUTH DAILY   pravastatin  (PRAVACHOL ) 80 MG tablet Take 1 tablet (80 mg total) by mouth daily.   pravastatin  (PRAVACHOL ) 80 MG tablet TAKE 1 TABLET BY MOUTH DAILY   Semaglutide,0.25 or 0.5MG /DOS, (OZEMPIC, 0.25 OR 0.5 MG/DOSE,) 2 MG/1.5ML SOPN Inject 0.5 mg into the skin once a week.   [DISCONTINUED] Fluticasone -Umeclidin-Vilant (TRELEGY ELLIPTA ) 200-62.5-25 MCG/ACT AEPB USE 1 INHALATION BY MOUTH DAILY   Fluticasone -Umeclidin-Vilant (TRELEGY ELLIPTA ) 200-62.5-25 MCG/ACT AEPB Inhale 1 puff into the lungs daily.   Facility-Administered Encounter Medications as of 01/27/2024  Medication   sodium chloride  flush (NS)  0.9 % injection 3 mL    Allergies as of 01/27/2024   (No Known Allergies)    Past Medical History:  Diagnosis Date   CAD (coronary artery disease), native coronary artery 11/18/2020   Cardiac cath done 11/18/2020 showed 20% mid RCA and RPDA, 35% ostial to proximal LAD, 5% ostial left circumflex and 20% mid LAD   Carotid stenosis    1-39% bilateral stenosis by Dopplers 10/2023   COPD (chronic obstructive pulmonary disease) (HCC)    Diabetes mellitus    Hyperlipidemia    Hypertension    Night sweats    Rectal polyp     Past  Surgical History:  Procedure Laterality Date   ABDOMINAL HYSTERECTOMY  2000   fibroids; ovaries intact.   BACK SURGERY     plate put in back   CHOLECYSTECTOMY N/A 05/01/2017   Procedure: LAPAROSCOPIC CHOLECYSTECTOMY WITH INTRAOPERATIVE CHOLANGIOGRAM;  Surgeon: Belinda Cough, MD;  Location: [facility] OR;  Service: General;  Laterality: N/A;   LEFT HEART CATH AND CORONARY ANGIOGRAPHY N/A 11/18/2020   Procedure: LEFT HEART CATH AND CORONARY ANGIOGRAPHY;  Surgeon: Burnard Debby LABOR, MD;  Location: [facility] LAB;  Service: Cardiovascular;  Laterality: N/A;   RECTAL POLYPECTOMY  05/16/2010   TVAdenoma polyp   TUBAL LIGATION      Family History  Problem Relation Age of Onset   Diabetes Sister    Diabetes Brother    Hypertension Brother    Cancer Mother 91       colon(age 28) and lung (age 48)   Diabetes Mother    Heart disease Mother 46       CABG   Hyperlipidemia Mother    Cancer Father 36       stomach   Diabetes Maternal Grandmother    Mental illness Sister     Social History   Socioeconomic History   Marital status: Divorced    Spouse name: Not on file   Number of children: 3   Years of education: Not on file   Highest education level: GED or equivalent  Occupational History   Occupation: retired    Comment: [employer]  Tobacco Use   Smoking status: Every Day    Current packs/day: 2.50    Average packs/day: 2.5 packs/day for 60.0 years (150.0 ttl pk-yrs)    Types: Cigarettes    Passive exposure: Current   Smokeless tobacco: Never   Tobacco comments:    1 ppd daily 05/22/23  Vaping Use   Vaping status: Never Used  Substance and Sexual Activity   Alcohol use: No   Drug use: No   Sexual activity: Not on file  Other Topics Concern   Not on file  Social History Narrative   Marital status: divorced; not dating in 2019 but interested slightly.        Children:  3 children; 7 seven grandchildren; 1 gg      Lives: with oldest daughter, 2 grandchildren, 1 gg, 1 friend  of daughters, daughter's boyfriend.       Employment: retired [employer] age 53.      Tobacco; 1ppd x 50 years.  Not interested in 2019.      Alcohol: none      Exercise: none in 2019. [place]      ADLs:   independent with ADLs; no assistant devices      Advanced Directives:  None; DNR/DNI; no HCPOA.       Lives with daughter  and her family/2025   Social Drivers of Health   Tobacco Use: High Risk (01/27/2024)   Patient History    Smoking Tobacco Use: Every Day    Smokeless Tobacco Use: Never    Passive Exposure: Current  Financial Resource Strain: Low Risk (11/07/2023)   Overall Financial Resource Strain (CARDIA)    Difficulty of Paying Living Expenses: Not hard at all  Food Insecurity: Unknown (11/07/2023)   Epic    Worried About Programme Researcher, Broadcasting/film/video in the Last Year: Patient declined    Barista in the Last Year: Never true  Transportation Needs: No Transportation Needs (11/07/2023)   Epic    Lack of Transportation (Medical): No    Lack of Transportation (Non-Medical): No  Physical Activity: Inactive (11/07/2023)   Exercise Vital Sign    Days of Exercise per Week: 0 days    Minutes of Exercise per Session: Not on file  Stress: No Stress Concern Present (11/07/2023)   [employer] of Occupational Health - Occupational Stress Questionnaire    Feeling of Stress: Not at all  Social Connections: Socially Isolated (11/07/2023)   Social Connection and Isolation Panel    Frequency of Communication with Friends and Family: More than three times a week    Frequency of Social Gatherings with Friends and Family: Once a week    Attends Religious Services: Patient declined    Database Administrator or Organizations: No    Attends Engineer, Structural: Not on file    Marital Status: Divorced  Intimate Partner Violence: Patient Unable To Answer (11/11/2023)   Epic    Fear of Current or Ex-Partner: Patient unable to answer    Emotionally  Abused: Patient unable to answer    Physically Abused: Patient unable to answer    Sexually Abused: Patient unable to answer  Depression (PHQ2-9): Low Risk (12/06/2023)   Depression (PHQ2-9)    PHQ-2 Score: 0  Alcohol Screen: Low Risk (12/01/2021)   Alcohol Screen    Last Alcohol Screening Score (AUDIT): 0  Housing: Unknown (11/07/2023)   Epic    Unable to Pay for Housing in the Last Year: No    Number of Times Moved in the Last Year: Not on file    Homeless in the Last Year: No  Utilities: Not At Risk (11/11/2023)   Epic    Threatened with loss of utilities: No  Health Literacy: Adequate Health Literacy (11/11/2023)   B1300 Health Literacy    Frequency of need for help with medical instructions: Never    Review of Systems  Respiratory:  Positive for shortness of breath. Negative for cough.     Vitals:   01/27/24 1116  BP: 118/64  Pulse: 98  Temp: 97.8 F (36.6 C)  SpO2: 98%     Physical Exam Constitutional:      Appearance: Normal appearance.  HENT:     Head: Normocephalic.     Mouth/Throat:     Mouth: Mucous membranes are moist.  Eyes:     General: No scleral icterus. Cardiovascular:     Rate and Rhythm: Normal rate and regular rhythm.     Heart sounds: No murmur heard.    No friction rub.  Pulmonary:     Effort: No respiratory distress.     Breath sounds: No stridor. No wheezing or rhonchi.  Musculoskeletal:     Cervical back: No rigidity or tenderness.  Neurological:     Mental Status: She is alert.  Psychiatric:  Mood and Affect: Mood normal.    Data Reviewed: PFT in 2022 with moderate obstruction, no significant bronchodilator response, moderately decreased diffusing capacity  Assessment and Plan Assessment & Plan Chronic obstructive pulmonary disease Well-managed with no recent exacerbations or bronchitis. She experiences dyspnea with minimal exertion, such as walking short distances. Lung examination is clear. - Continue Trelegy as  prescribed. - Encouraged regular exercise to maintain physical activity levels. - Scheduled follow-up appointment in six months.  Nicotine  dependence She has reduced smoking from a pack a day to twelve cigarettes a day, indicating progress in smoking cessation efforts. - Continue efforts to reduce smoking.   Encourage graded activities as tolerated Follow-up in 6 months  Encouraged to give us  a call with any significant concerns Trelegy refills placed  No orders of the defined types were placed in this encounter.     Jennet Epley MD [facility] 01/27/2024, 11:27 AM  CC: Jaycee Greig PARAS, NP   "

## 2024-03-09 ENCOUNTER — Ambulatory Visit: Admitting: Family Medicine
